# Patient Record
Sex: Female | Born: 1960 | Race: Black or African American | Hispanic: No | Marital: Married | Smoking: Former smoker
Health system: Southern US, Community
[De-identification: ages and names within clinical notes are randomized; demographics above are authoritative.]

## PROBLEM LIST (undated history)

## (undated) DIAGNOSIS — R0683 Snoring: Secondary | ICD-10-CM

## (undated) DIAGNOSIS — B0229 Other postherpetic nervous system involvement: Secondary | ICD-10-CM

## (undated) DIAGNOSIS — B029 Zoster without complications: Secondary | ICD-10-CM

## (undated) DIAGNOSIS — M9909 Segmental and somatic dysfunction of abdomen and other regions: Secondary | ICD-10-CM

## (undated) DIAGNOSIS — K219 Gastro-esophageal reflux disease without esophagitis: Secondary | ICD-10-CM

## (undated) DIAGNOSIS — R0789 Other chest pain: Secondary | ICD-10-CM

## (undated) DIAGNOSIS — I10 Essential (primary) hypertension: Secondary | ICD-10-CM

## (undated) DIAGNOSIS — E669 Obesity, unspecified: Secondary | ICD-10-CM

## (undated) DIAGNOSIS — E7800 Pure hypercholesterolemia, unspecified: Secondary | ICD-10-CM

## (undated) DIAGNOSIS — T7840XA Allergy, unspecified, initial encounter: Secondary | ICD-10-CM

## (undated) HISTORY — DX: Allergy, unspecified, initial encounter: T78.40XA

## (undated) HISTORY — PX: TUBAL LIGATION: SHX77

## (undated) HISTORY — DX: Other chest pain: R07.89

## (undated) HISTORY — DX: Segmental and somatic dysfunction of abdomen and other regions: M99.09

## (undated) HISTORY — DX: Gastro-esophageal reflux disease without esophagitis: K21.9

## (undated) HISTORY — DX: Pure hypercholesterolemia, unspecified: E78.00

## (undated) HISTORY — DX: Essential (primary) hypertension: I10

## (undated) HISTORY — DX: Snoring: R06.83

## (undated) HISTORY — DX: Obesity, unspecified: E66.9

## (undated) HISTORY — DX: Zoster without complications: B02.9

## (undated) HISTORY — PX: DILATION AND CURETTAGE OF UTERUS: SHX78

## (undated) HISTORY — DX: Other postherpetic nervous system involvement: B02.29

## (undated) HISTORY — PX: COLONOSCOPY: SHX174

---

## 1998-05-30 ENCOUNTER — Other Ambulatory Visit: Admission: RE | Admit: 1998-05-30 | Discharge: 1998-05-30 | Payer: Self-pay | Admitting: Obstetrics

## 2001-08-23 ENCOUNTER — Inpatient Hospital Stay (HOSPITAL_COMMUNITY): Admission: AD | Admit: 2001-08-23 | Discharge: 2001-08-23 | Payer: Self-pay | Admitting: Obstetrics

## 2001-08-23 ENCOUNTER — Encounter (INDEPENDENT_AMBULATORY_CARE_PROVIDER_SITE_OTHER): Payer: Self-pay

## 2001-10-05 ENCOUNTER — Observation Stay (HOSPITAL_COMMUNITY): Admission: RE | Admit: 2001-10-05 | Discharge: 2001-10-06 | Payer: Self-pay | Admitting: Obstetrics

## 2001-10-05 ENCOUNTER — Encounter (INDEPENDENT_AMBULATORY_CARE_PROVIDER_SITE_OTHER): Payer: Self-pay | Admitting: Specialist

## 2001-12-27 ENCOUNTER — Ambulatory Visit (HOSPITAL_COMMUNITY): Admission: RE | Admit: 2001-12-27 | Discharge: 2001-12-27 | Payer: Self-pay | Admitting: Obstetrics

## 2001-12-27 ENCOUNTER — Encounter: Payer: Self-pay | Admitting: Obstetrics

## 2004-11-07 ENCOUNTER — Ambulatory Visit: Payer: Self-pay | Admitting: Pulmonary Disease

## 2004-11-25 ENCOUNTER — Ambulatory Visit (HOSPITAL_COMMUNITY): Admission: RE | Admit: 2004-11-25 | Discharge: 2004-11-25 | Payer: Self-pay | Admitting: Obstetrics

## 2005-11-12 ENCOUNTER — Ambulatory Visit: Payer: Self-pay | Admitting: Pulmonary Disease

## 2006-11-18 ENCOUNTER — Ambulatory Visit: Payer: Self-pay | Admitting: Pulmonary Disease

## 2006-11-18 LAB — CONVERTED CEMR LAB
ALT: 28 units/L (ref 0–35)
Albumin: 4.1 g/dL (ref 3.5–5.2)
Alkaline Phosphatase: 59 units/L (ref 39–117)
Bilirubin Urine: NEGATIVE
Creatinine, Ser: 0.8 mg/dL (ref 0.4–1.2)
Direct LDL: 194 mg/dL
Eosinophils Absolute: 0.1 10*3/uL (ref 0.0–0.6)
Eosinophils Relative: 0.6 % (ref 0.0–5.0)
GFR calc Af Amer: 100 mL/min
Glucose, Bld: 102 mg/dL — ABNORMAL HIGH (ref 70–99)
Hemoglobin, Urine: NEGATIVE
Hemoglobin: 13.5 g/dL (ref 12.0–15.0)
Leukocytes, UA: NEGATIVE
Monocytes Absolute: 0.9 10*3/uL — ABNORMAL HIGH (ref 0.2–0.7)
RBC: 4.21 M/uL (ref 3.87–5.11)
Sodium: 140 meq/L (ref 135–145)
Total Bilirubin: 1.1 mg/dL (ref 0.3–1.2)
Total CHOL/HDL Ratio: 4.6
Total Protein, Urine: NEGATIVE mg/dL
Total Protein: 7.4 g/dL (ref 6.0–8.3)
Triglycerides: 106 mg/dL (ref 0–149)
VLDL: 21 mg/dL (ref 0–40)

## 2008-02-16 ENCOUNTER — Emergency Department (HOSPITAL_COMMUNITY): Admission: EM | Admit: 2008-02-16 | Discharge: 2008-02-17 | Payer: Self-pay | Admitting: *Deleted

## 2008-02-19 ENCOUNTER — Emergency Department (HOSPITAL_COMMUNITY): Admission: EM | Admit: 2008-02-19 | Discharge: 2008-02-19 | Payer: Self-pay | Admitting: Emergency Medicine

## 2008-02-25 ENCOUNTER — Encounter: Admission: RE | Admit: 2008-02-25 | Discharge: 2008-03-28 | Payer: Self-pay | Admitting: Family Medicine

## 2008-08-15 ENCOUNTER — Ambulatory Visit: Payer: Self-pay | Admitting: Pulmonary Disease

## 2008-08-15 ENCOUNTER — Telehealth (INDEPENDENT_AMBULATORY_CARE_PROVIDER_SITE_OTHER): Payer: Self-pay | Admitting: *Deleted

## 2008-08-15 DIAGNOSIS — B0229 Other postherpetic nervous system involvement: Secondary | ICD-10-CM | POA: Insufficient documentation

## 2008-08-15 DIAGNOSIS — E7800 Pure hypercholesterolemia, unspecified: Secondary | ICD-10-CM | POA: Insufficient documentation

## 2008-08-15 DIAGNOSIS — B029 Zoster without complications: Secondary | ICD-10-CM | POA: Insufficient documentation

## 2008-08-15 DIAGNOSIS — J309 Allergic rhinitis, unspecified: Secondary | ICD-10-CM | POA: Insufficient documentation

## 2008-08-15 DIAGNOSIS — R0989 Other specified symptoms and signs involving the circulatory and respiratory systems: Secondary | ICD-10-CM

## 2008-08-15 DIAGNOSIS — M999 Biomechanical lesion, unspecified: Secondary | ICD-10-CM | POA: Insufficient documentation

## 2008-08-15 DIAGNOSIS — I10 Essential (primary) hypertension: Secondary | ICD-10-CM | POA: Insufficient documentation

## 2008-08-15 DIAGNOSIS — R0789 Other chest pain: Secondary | ICD-10-CM | POA: Insufficient documentation

## 2008-08-15 DIAGNOSIS — E669 Obesity, unspecified: Secondary | ICD-10-CM | POA: Insufficient documentation

## 2008-08-15 DIAGNOSIS — K219 Gastro-esophageal reflux disease without esophagitis: Secondary | ICD-10-CM | POA: Insufficient documentation

## 2008-08-15 DIAGNOSIS — R0609 Other forms of dyspnea: Secondary | ICD-10-CM | POA: Insufficient documentation

## 2008-08-20 LAB — CONVERTED CEMR LAB
ALT: 20 units/L (ref 0–35)
AST: 21 units/L (ref 0–37)
Basophils Absolute: 0 10*3/uL (ref 0.0–0.1)
Basophils Relative: 0 % (ref 0.0–3.0)
Bilirubin, Direct: 0.1 mg/dL (ref 0.0–0.3)
Chloride: 105 meq/L (ref 96–112)
Creatinine, Ser: 0.7 mg/dL (ref 0.4–1.2)
Eosinophils Relative: 1.1 % (ref 0.0–5.0)
HCT: 36.6 % (ref 36.0–46.0)
Hemoglobin: 12.6 g/dL (ref 12.0–15.0)
Monocytes Absolute: 0.7 10*3/uL (ref 0.1–1.0)
Neutro Abs: 4.1 10*3/uL (ref 1.4–7.7)
Neutrophils Relative %: 51.8 % (ref 43.0–77.0)
TSH: 1.63 microintl units/mL (ref 0.35–5.50)
Total Bilirubin: 0.8 mg/dL (ref 0.3–1.2)
WBC: 7.9 10*3/uL (ref 4.5–10.5)

## 2008-09-19 ENCOUNTER — Ambulatory Visit: Payer: Self-pay | Admitting: Pulmonary Disease

## 2008-10-06 LAB — CONVERTED CEMR LAB
Total CHOL/HDL Ratio: 3
VLDL: 21.2 mg/dL (ref 0.0–40.0)

## 2009-04-02 ENCOUNTER — Ambulatory Visit (HOSPITAL_COMMUNITY): Admission: RE | Admit: 2009-04-02 | Discharge: 2009-04-02 | Payer: Self-pay | Admitting: Obstetrics

## 2009-04-20 ENCOUNTER — Telehealth (INDEPENDENT_AMBULATORY_CARE_PROVIDER_SITE_OTHER): Payer: Self-pay | Admitting: *Deleted

## 2009-11-28 ENCOUNTER — Ambulatory Visit: Payer: Self-pay | Admitting: Pulmonary Disease

## 2009-11-29 LAB — CONVERTED CEMR LAB
BUN: 10 mg/dL (ref 6–23)
Basophils Absolute: 0 10*3/uL (ref 0.0–0.1)
Basophils Relative: 0.4 % (ref 0.0–3.0)
Bilirubin Urine: NEGATIVE
Cholesterol: 229 mg/dL — ABNORMAL HIGH (ref 0–200)
Creatinine, Ser: 0.7 mg/dL (ref 0.4–1.2)
Direct LDL: 144.6 mg/dL
Eosinophils Relative: 0.4 % (ref 0.0–5.0)
GFR calc non Af Amer: 120.92 mL/min (ref >60)
HDL: 61.4 mg/dL (ref >39.00)
Hemoglobin, Urine: NEGATIVE
Hemoglobin: 12.8 g/dL (ref 12.0–15.0)
Ketones, ur: NEGATIVE mg/dL
Lymphs Abs: 2.5 10*3/uL (ref 0.7–4.0)
Monocytes Absolute: 0.6 10*3/uL (ref 0.1–1.0)
Neutro Abs: 4.4 10*3/uL (ref 1.4–7.7)
Neutrophils Relative %: 58.1 % (ref 43.0–77.0)
Platelets: 244 10*3/uL (ref 150.0–400.0)
RDW: 13.4 % (ref 11.5–14.6)
Total Protein, Urine: NEGATIVE mg/dL
Total Protein: 7.1 g/dL (ref 6.0–8.3)
Triglycerides: 87 mg/dL (ref 0.0–149.0)
WBC: 7.5 10*3/uL (ref 4.5–10.5)
pH: 6 (ref 5.0–8.0)

## 2010-04-03 ENCOUNTER — Ambulatory Visit (HOSPITAL_COMMUNITY)
Admission: RE | Admit: 2010-04-03 | Discharge: 2010-04-03 | Payer: Self-pay | Source: Home / Self Care | Attending: Obstetrics | Admitting: Obstetrics

## 2010-04-05 ENCOUNTER — Ambulatory Visit (HOSPITAL_COMMUNITY)
Admission: RE | Admit: 2010-04-05 | Discharge: 2010-04-05 | Payer: Self-pay | Source: Home / Self Care | Attending: Obstetrics | Admitting: Obstetrics

## 2010-04-28 ENCOUNTER — Encounter: Payer: Self-pay | Admitting: Obstetrics

## 2010-05-07 NOTE — Progress Notes (Signed)
Summary: prescriptions  Phone Note Call from Patient Call back at [phone]   Caller: Patient Call For: nadel Summary of Call: Needs something called in for her allergies and sinus. Also needs something for sleep and shingles.//[facility] Initial call taken by: Darletta Moll,  April 20, 2009 10:42 AM  Follow-up for Phone Call        called, spoke with pt.  1.  Pt states she "believes" she is getting a "reoccurance of shingles."  states right arm is hurting, and she has bumps on back, top of right shoulder, and under right breast x2wks.  requesting pain med, med for shingles, and SN's recs.  2.  c/o eyes watering all the time, eyes are sensitive to light, eyes are "closing up,"  and having head congestion and drainage with thick yellowish green mucus x2 mo.  denies fever.  3.  requesting rx for sleep med.  Will forward message to SN-please advise.  Thanks! NKDA [facility] [address] Follow-up by: Gweneth Dimitri RN,  April 20, 2009 11:47 AM  Additional Follow-up for Phone Call Additional follow up Details #1::        per SN---is shingles pain persists then she will need neurology eval with neurologist---for the cough and drainage--ok for pt to have zpak #1  and use mucinex max 1 by mouth two times a day regularly with plenty of fluids and for the insomnia she can use ambien 10mg   #30  1 by mouth at bedtime .  thanks    Additional Follow-up for Phone Call Additional follow up Details #2::    called, spoke with pt.  Pt informed of above statement per SN and aware rxs [facility] to [facility] [address]  She verbalized understanding of instructions  Follow-up by: Gweneth Dimitri RN,  April 20, 2009 2:59 PM  New/Updated Medications: ZITHROMAX Z-PAK 250 MG TABS (AZITHROMYCIN) use as directed AMBIEN 10 MG TABS (ZOLPIDEM TARTRATE) 1 by mouth at bedtime Prescriptions: AMBIEN 10 MG TABS (ZOLPIDEM TARTRATE) 1 by mouth at bedtime  #30 x 0   Entered by:   Gweneth Dimitri RN   Authorized  by:   Michele Mcalpine MD   Signed by:   Gweneth Dimitri RN on 04/20/2009   Method used:   Telephoned to ...       [facility] [address] [id number]* (retail)       [address] [place], [place]  [address]       Ph: [phone]       Fax: [phone]   RxID:   [id number] ZITHROMAX Z-PAK 250 MG TABS (AZITHROMYCIN) use as directed  #1 x 0   Entered by:   Gweneth Dimitri RN   Authorized by:   Michele Mcalpine MD   Signed by:   Gweneth Dimitri RN on 04/20/2009   Method used:   Electronically to        [facility] [address] [id number]* (retail)       [address] [place], [place]  [address]       Ph: [phone]       Fax: [phone]   RxID:   [id number]

## 2010-05-07 NOTE — Assessment & Plan Note (Signed)
Summary: physical ///kp   CC:  Yearly ROV & CPX....  History of Present Illness: 50 y/o BF here for an annual CPX...   ~  May10:  add-on visit due to shingles pain...  she states that she developed a painful rash on her right side about 1 month ago and went to an [facility] where she was diagnosed w/ right T5-6 shingles... she was given Valtrex and Percocet... the rash improved but the pain increased and has persisted... she ret to the [facility] and was given more Percocet... she now c/o persistant burning pain in this right T5-6 distrib, and she rates this a 6-7/10 in severity... given Depo80/ Dosepak, Neurontin, Vicodin...   ~  September 19, 2008:  Shingles eruption resolved w/ min scarring... pain improved too- taking Neurontin 300Bid and Vicodin Prn only (prev needed 3-4 per day)... overall felling well, difficult time w/ diet + exercise...  weight 238#- no change & we discussed diet + exercise program.. also hx incr chol & she agrees to try statin Rx if FLP looks similar today...   ~  November 28, 2009:  she's had a good yr but c/o incr allergy symptoms w/ sneezing, nasal congestion, and itching all over> we discussed Rx w/ Omnaris, Saline, & Atarax- next step= allergy testing... BP stable w/ low sodium diet but she needs to lose wt or face meds in the future & she understands this... similarly she refuses Statin Rx for Chol but needs to lose substantial weight... requests refills Vicodin, Ambien today.    Current Problems:   ALLERGIC RHINITIS (ICD-477.9) - she uses OTC antihistamines Prn, but c/o increased symptoms and we discussed adding OMNARIS Qhs, Saline Prn, and ATARAX 25mg  Prn itching...  Hx of SNORING (ICD-786.09) - Hx snoring and ? of OSA- she was rec to take a sleep study but she declined... we discussed sleep hygiene & the importance of weight reduction...  HYPERTENSION, MILD (ICD-401.1) - BP's up to 150/90 in the past and improved at present 134/88 today... not on med Rx and encouraged to elim  sodium & lose weight... denies HA, visual changes, palipit, dizziness, syncope, dyspnea, edema, etc...  CHEST PAIN, ATYPICAL (ICD-786.59) - hx mult somatic complaints and CWP in the past... baseline EKG w/ NSR (rare PVC), WNL.[name]. baseline CXR w/ clear lungs, NAD...  HYPERCHOLESTEROLEMIA (ICD-272.0) - we have perscribed Simvastatin 40mg /d twice in the past (8/07 & 8/08) & Cres10 (6/10) but she never followed through & stopped the meds...we have discussed low chol/ low fat diet & weight reduction strategies...   ~  FLP 8/07 on diet showed TChol 245, TG 113, HDL 47, LDL 169... rec to start Simva40- never did.  ~  FLP 8/08 on diet showed TChol 279, TG 106, HDL 60, LDL 194... rec to start Simva40- pt stopped.  ~  FLP 6/10 on diet showed TChol 210, TG 106, HDL 64, LDL 130... rec> Cres10- pt stopped on her own.  ~  FLP 8/11 on diet alone showed TChol 229, TG 87, HDL 61, LDL 145... refuses meds, must lose weight!!!  OBESITY (ICD-278.00) - we have discussed diet/ exercise/ wt reduction strategies...  ~  initial weight 7/89 = 229#... max weight 8/06 = 274#.[name].  ~  weight 8/07 = 262#  ~  weight 8/08 = 260#  ~  weight 5/10 = 239#  ~  weight 8/11 = 230#  GERD (ICD-530.81) - hx reflux symptoms but never had GI eval or EGD... takes OTC Prilosec vs Ranitadine as needed.  Hx  of SOMATIC DYSFUNCTION (ICD-739.9) - hx mult somatic complaints in the past...  ~  6/10: c/o headaches, insomnia, mult somatic complaints- Rx w/ Vicodin Prn use + AMBIEN 10mg  Prn for sleep.  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  Health Maintenance - GYN= DrMarshall w/ last PAP 12/10... usually gets yearly Mammogram at [facility]...   Preventive Screening-Counseling & Management  Alcohol-Tobacco     Smoking Status: quit     Year Quit: 2008  Allergies (verified): No Known Drug Allergies  Comments:  Nurse/Medical  Assistant: The patient's medications and allergies were reviewed with the patient and were updated in the Medication and Allergy Lists.  Past History:  Past Medical History: ALLERGIC RHINITIS (ICD-477.9) Hx of SNORING (ICD-786.09) HYPERTENSION, MILD (ICD-401.1) CHEST PAIN, ATYPICAL (ICD-786.59) HYPERCHOLESTEROLEMIA (ICD-272.0) OBESITY (ICD-278.00) GERD (ICD-530.81) Hx of SOMATIC DYSFUNCTION (ICD-739.9) SHINGLES (ICD-053.9) POSTHERPETIC NEURALGIA (ICD-053.19)  Social History: Smoking Status:  quit  Vital Signs:  Patient profile:   50 year old female Height:      66 inches Weight:      230 pounds BMI:     37.26 O2 Sat:      100 % on Room air Temp:     97.7 degrees F oral Pulse rate:   78 / minute BP sitting:   134 / 88  (left arm) Cuff size:   regular  Vitals Entered By: Randell Loop CMA (November 28, 2009 10:33 AM)  O2 Sat at Rest %:  100 O2 Flow:  Room air CC: Yearly ROV & CPX... Is Patient Diabetic? No Pain Assessment Patient in pain? yes      Comments no daily meds per pt---she is out of all meds on her list    CXR  Procedure date:  11/28/2009  Findings:      CHEST - 2 VIEW Comparison: 09/19/2008   Findings: Midline trachea.  Normal heart size and mediastinal contours. No pleural effusion or pneumothorax.  Increased density projecting over the lung bases is secondary to overlying soft tissues. Clear lungs.   IMPRESSION: No acute cardiopulmonary disease.   Read By:  Consuello Bossier,  M.D.   EKG  Procedure date:  11/28/2009  Findings:      Normal sinus rhythm with rate of:  80/ min... Tracing shows rare PVC, otherw WNL, NAD...   MISC. Report  Procedure date:  11/28/2009  Findings:      Lipid Panel (LIPID)   Cholesterol          [H]  229 mg/dL                   1-610   Triglycerides             87.0 mg/dL                  9.6-045.4   HDL                       09.81 mg/dL                 >19.14 Cholesterol LDL - Direct                              144.6 mg/dL                    BMP (METABOL)  Sodium                    140 mEq/L                   135-145   Potassium                 4.0 mEq/L                   3.5-5.1   Chloride                  105 mEq/L                   96-112   Carbon Dioxide            28 mEq/L                    19-32   Glucose                   89 mg/dL                    14-78   BUN                       10 mg/dL                    2-95   Creatinine                0.7 mg/dL                   6.2-1.3   Calcium                   9.4 mg/dL                   0.8-65.7   GFR                       120.92 mL/min               >60  Hepatic/Liver Function Panel (HEPATIC)   Total Bilirubin           0.9 mg/dL                   8.4-6.9   Direct Bilirubin          0.1 mg/dL                   6.2-9.5   Alkaline Phosphatase      43 U/L                      39-117   AST                       20 U/L                      0-37   ALT                       19 U/L                      0-35   Total Protein             7.1 g/dL  6.0-8.3   Albumin                   4.3 g/dL                    1.6-1.0  Comments:      CBC Platelet w/Diff (CBCD)   White Cell Count          7.5 K/uL                    4.5-10.5   Red Cell Count            3.99 Mil/uL                 3.87-5.11   Hemoglobin                12.8 g/dL                   96.0-45.4   Hematocrit                37.5 %                      36.0-46.0   MCV                       94.0 fl                     78.0-100.0   Platelet Count            244.0 K/uL                  150.0-400.0   Neutrophil %              58.1 %                      43.0-77.0   Lymphocyte %              32.7 %                      12.0-46.0   Monocyte %                8.4 %                       3.0-12.0   Eosinophils%              0.4 %                       0.0-5.0   Basophils %               0.4 %                       0.0-3.0   TSH (TSH)   FastTSH                   0.79  uIU/mL                 0.35-5.50  UDip Only (UDIP)   Color                     LT. YELLOW   Clarity                   SL CLOUDY  Clear   Specific Gravity          >=1.030                     1.000 - 1.030   Urine Ph                  6.0                         5.0-8.0   Protein                   NEGATIVE                    Negative   Urine Glucose             NEGATIVE                    Negative   Ketones                   NEGATIVE                    Negative   Urine Bilirubin           NEGATIVE                    Negative   Blood                     NEGATIVE                    Negative   Urobilinogen              0.2                         0.0 - 1.0   Leukocyte Esterace        NEGATIVE                    Negative   Nitrite                   NEGATIVE                    Negative  Impression & Recommendations:  Problem # 1:  PHYSICAL EXAMINATION (ICD-V70.0)  Orders: EKG w/ Interpretation (93000) TLB-Lipid Panel (80061-LIPID) TLB-BMP (Basic Metabolic Panel-BMET) (80048-METABOL) TLB-Hepatic/Liver Function Pnl (80076-HEPATIC) TLB-CBC Platelet - w/Differential (85025-CBCD) TLB-TSH (Thyroid Stimulating Hormone) (84443-TSH) TLB-Udip ONLY (81003-UDIP) T-2 View CXR (71020TC)  Problem # 2:  ALLERGIC RHINITIS (ICD-477.9) We discussed ALLEGRA, OMNARIS, ATARAX... allergy testing if symptoms don't abate. Her updated medication list for this problem includes:    Omnaris 50 Mcg/act Susp (Ciclesonide) .[name]... 2 sprays each nostril at bedtime...  Problem # 3:  HYPERTENSION, MILD (ICD-401.1) Controlled on diet alone, but desperately needs to lose weight...  Problem # 4:  HYPERCHOLESTEROLEMIA (ICD-272.0) She refuses medication & wants to control on diet alone> discussed the weight loss necessary for controlling her Chol etc...  Problem # 5:  OBESITY (ICD-278.00) Discussed diet + exercise needed...  Problem # 6:  OTHER MEDICAL ISSUES AS NOTED>>>  Complete Medication List: 1)   Omnaris 50 Mcg/act Susp (Ciclesonide) .... 2 sprays each nostril at bedtime.Marland KitchenMarland Kitchen 2)  Hydrocodone-acetaminophen 5-500 Mg Tabs (Hydrocodone-acetaminophen) .... Take 1-2 tabs by mouth three times a day as needed for severe pain.Marland KitchenMarland Kitchen  3)  Ambien 10 Mg Tabs (Zolpidem tartrate) .[name].. 1 by mouth at bedtime 4)  Hydroxyzine Hcl 25 Mg Tabs (Hydroxyzine hcl) .... Take 1 tab by mouth every 4 h as needed for itching...  Patient Instructions: 1)  Today we updated your med list- see below.... 2)  We refilled your meds & wrote new perscriptions for OMNARIS & HYDROXYZINE for you allergies.Marland KitchenMarland Kitchen 3)  Today we did your follow up CXR, EKG, & Fasting blood work... please call the "phone tree" in a few days for your lab results.Marland KitchenMarland Kitchen 4)  Call for any questions.Marland KitchenMarland Kitchen 5)  Please schedule a follow-up appointment in 1 year.     [facility] ECG  ID: [id number] Patient: Jessica Lynn, Jessica Lynn DOB: November 11, 1960 Age: 50 Years Old Sex: Female Race: Black Physician: scott nadel Technician: Randell Loop CMA Height: 66 Weight: 230 Status: Unconfirmed Past Medical History:  ALLERGIC RHINITIS (ICD-477.9) Hx of SNORING (ICD-786.09) HYPERTENSION, MILD (ICD-401.1) CHEST PAIN, ATYPICAL (ICD-786.59) HYPERCHOLESTEROLEMIA (ICD-272.0) OBESITY (ICD-278.00) GERD (ICD-530.81) Hx of SOMATIC DYSFUNCTION (ICD-739.9) SHINGLES (ICD-053.9) POSTHERPETIC NEURALGIA (ICD-053.19)   Recorded: 11/28/2009 10:48 AM P/PR: 90 ms / 132 ms - Heart rate (maximum exercise) QRS: 93 QT/QTc/QTd: 376 ms / 409 ms / 20 ms - Heart rate (maximum exercise)  P/QRS/T axis: 52 deg / 39 deg / 2 deg - Heart rate (maximum exercise)  Heartrate: 79 bpm  Interpretation:  Normal sinus rhythm with rate of:  80/ min... Tracing shows rare PVC, otherw WNL, NAD.Marland KitchenMarland Kitchen

## 2010-05-27 ENCOUNTER — Encounter: Payer: Self-pay | Admitting: Pulmonary Disease

## 2010-05-29 ENCOUNTER — Encounter: Payer: Self-pay | Admitting: Pulmonary Disease

## 2010-05-29 ENCOUNTER — Telehealth (INDEPENDENT_AMBULATORY_CARE_PROVIDER_SITE_OTHER): Payer: Self-pay | Admitting: *Deleted

## 2010-05-31 ENCOUNTER — Encounter: Payer: Self-pay | Admitting: Pulmonary Disease

## 2010-06-04 NOTE — Medication Information (Signed)
Summary: Tax adviser   Imported By: Valinda Hoar 05/31/2010 13:18:56  _____________________________________________________________________  External Attachment:    Type:   Image     Comment:   External Document

## 2010-06-04 NOTE — Progress Notes (Signed)
Summary: Zolpidem PA initiated---APPROVED from 05/09/2010 to 05/30/2011  Phone Note Outgoing Call   Call placed by: Michel Bickers CMA,  May 29, 2010 9:42 AM Call placed to: [facility] [phone] Summary of Call: PA initiated for Zolpidem for quantity exception. Plan allows 15 tabs within a 30 day period only. Member ID 3 is [id number].   Case # is [id number].   Awaiting faxed form. Initial call taken by: Michel Bickers CMA,  May 29, 2010 9:44 AM  Follow-up for Phone Call        Form received and placed on SN cart for signature.Michel Bickers CMA  May 29, 2010 10:14 AM  Zolpidem has been APPROVED for additional quantity from 05/09/2010 to 05/30/2011. Patient and pharmacy notified. Follow-up by: Michel Bickers CMA,  May 31, 2010 8:59 AM  Additional Follow-up for Phone Call Additional follow up Details #1::

## 2010-06-13 NOTE — Medication Information (Signed)
Summary: Prior Authorization for Zolpidem / [employer]  Prior Authorization for Zolpidem / [employer]   Imported By: Lennie Odor 06/03/2010 14:24:23  _____________________________________________________________________  External Attachment:    Type:   Image     Comment:   External Document

## 2010-06-13 NOTE — Medication Information (Signed)
Summary: Prior Authorization Needed for Zolpidem / [facility]  Prior Authorization Needed for Zolpidem / [facility]   Imported By: Lennie Odor 06/03/2010 14:28:14  _____________________________________________________________________  External Attachment:    Type:   Image     Comment:   External Document

## 2010-07-08 ENCOUNTER — Telehealth: Payer: Self-pay | Admitting: Pulmonary Disease

## 2010-07-08 NOTE — Telephone Encounter (Signed)
Pt requesting Hydrocodone/APAP 5-500mg  Take 1-2 tabs three times daily to be called to [facility]. Pt last seen 11/28/2009. Please advise. Thanks

## 2010-07-10 MED ORDER — HYDROCODONE-ACETAMINOPHEN 5-500 MG PO TABS: 1.0000 | ORAL_TABLET | Freq: Three times a day (TID) | ORAL | Status: DC | PRN

## 2010-07-10 NOTE — Telephone Encounter (Signed)
Called into the pharamcy with vicodin 5/500 refills x 5.  Pt is aware and pt has appt with SN in august for cpx.

## 2010-08-23 NOTE — Discharge Summary (Signed)
[facility]  Patient:    Jessica Lynn, Jessica Lynn Visit Number: [id number] MRN: [id number]          Service Type: DSU Location: 9300 [id number] Attending Physician:  Venita Sheffield Dictated by:   Kathreen Cosier, M.D. Admit Date:  10/05/2001 Discharge Date: 10/06/2001                             Discharge Summary  HISTORY AND HOSPITAL COURSE:      The patient is a 50 year old female who came in for open laparoscopic tubal sterilization on October 05, 2001.  The procedure was not completed as a laparoscopic and she had a mini lap performed with an abdominal tubal ligation performed.  It was decided she would be kept overnight.  Prior to discharge vital signs were all normal, abdomen soft, good bowel sounds, dressing was dry, and the patient felt fine.  She was discharged home on Tylox two every 3-4 hours to see me in 2 weeks.  DISCHARGE DIAGNOSIS:              Status post abdominal tubal ligation through                                   mini laparoscopy. Dictated by:   Kathreen Cosier, M.D. Attending Physician:  Venita Sheffield DD:  10/06/01 TD:  10/08/01 Job: [id number]

## 2010-08-23 NOTE — Op Note (Signed)
[facility]  Patient:    Jessica Lynn, Jessica Lynn Visit Number: [id number] MRN: [id number]          Service Type: DSU Location: 9300 [id number] Attending Physician:  Venita Sheffield Dictated by:   Kathreen Cosier, M.D. Proc. Date: 10/05/01 Admit Date:  10/05/2001 Discharge Date: 10/06/2001                             Operative Report  PREOPERATIVE DIAGNOSES:       Multiparity.  PROCEDURE:                    Failed laparoscopic tubal and procedure converted to abdominal tubal through a mini laparotomy.  Under general anesthesia with patient in lithotomy position, abdomen, perineum, and vagina prepped and draped.  A weighted speculum placed in the vagina after the bladder was emptied with a straight catheter.  Hulka tenaculum placed in the cervix.  Transverse incision made through the umbilicus, carried down to the fascia.  Panniculus was approximately 4-5 inches deep.  The fascia was cleaned and grasped with two Kochers, then the sleeve of the trocar inserted intraperitoneally.  Attempt to maintain pneumoperitoneum was unsatisfactory because of leakage of the gas plus when the scope inserted, because of the amount of fatty tissue intra-abdominally, the uterus could be visualized but the tubes and ovaries could not be visualized.  It was decided the procedure would be converted to an abdominal tubal for a mini laparotomy.  The instruments were removed.  Fascia closed with one stitch of 0 Dexon.  Skin closed with subcuticular suture of plain.  Abdomen reprepped and draped and the bladder emptied with the Foley catheter.  Transverse suprapubic incision made, carried down to the fascia.  Fascia cleaned and incised the length of the incision.  Recti muscles retracted laterally.  Peritoneum opened longitudinally.  The uterus and tubes were visualized and ovaries appeared normal.  On the left side the tube was grasped with a Babcock clamp and 0 plain  suture placed in the mesosalpinx below the portion of tube in the clamp. This was tied and approximately 1 inch of tube transected.  The procedure was done in the exact fashion on the other side.  Ties were doubly tied with 0 plain suture.  Hemostasis satisfactory.  Fascia closed with continuous suture of 0 Dexon.  Subcutaneous tissue closed with 3-0 plain.  Skin closed with subcuticular stitch of 3-0 Monocryl.  The patient tolerated procedure well. Taken to recovery room in good condition. Dictated by:   Kathreen Cosier, M.D. Attending Physician:  Venita Sheffield DD:  10/05/01 TD:  10/07/01 Job: [id number]

## 2010-12-02 ENCOUNTER — Ambulatory Visit (INDEPENDENT_AMBULATORY_CARE_PROVIDER_SITE_OTHER): Payer: BC Managed Care – PPO | Admitting: Pulmonary Disease

## 2010-12-02 ENCOUNTER — Encounter: Payer: Self-pay | Admitting: Pulmonary Disease

## 2010-12-02 VITALS — BP 120/82 | HR 82 | Temp 97.1°F | Ht 66.0 in | Wt 241.6 lb

## 2010-12-02 DIAGNOSIS — B0229 Other postherpetic nervous system involvement: Secondary | ICD-10-CM

## 2010-12-02 DIAGNOSIS — J309 Allergic rhinitis, unspecified: Secondary | ICD-10-CM

## 2010-12-02 DIAGNOSIS — E7800 Pure hypercholesterolemia, unspecified: Secondary | ICD-10-CM

## 2010-12-02 DIAGNOSIS — R0789 Other chest pain: Secondary | ICD-10-CM

## 2010-12-02 DIAGNOSIS — Z0000 Encounter for general adult medical examination without abnormal findings: Secondary | ICD-10-CM

## 2010-12-02 DIAGNOSIS — K219 Gastro-esophageal reflux disease without esophagitis: Secondary | ICD-10-CM

## 2010-12-02 DIAGNOSIS — E669 Obesity, unspecified: Secondary | ICD-10-CM

## 2010-12-02 DIAGNOSIS — M999 Biomechanical lesion, unspecified: Secondary | ICD-10-CM

## 2010-12-02 DIAGNOSIS — I10 Essential (primary) hypertension: Secondary | ICD-10-CM

## 2010-12-02 MED ORDER — HYDROCODONE-ACETAMINOPHEN 5-500 MG PO TABS: 1.0000 | ORAL_TABLET | Freq: Three times a day (TID) | ORAL | Status: DC | PRN

## 2010-12-02 MED ORDER — ZOLPIDEM TARTRATE 10 MG PO TABS: 10.0000 mg | ORAL_TABLET | Freq: Every evening | ORAL | Status: DC | PRN

## 2010-12-02 MED ORDER — CICLESONIDE 50 MCG/ACT NA SUSP: 2.0000 | Freq: Every day | NASAL | Status: DC

## 2010-12-02 NOTE — Patient Instructions (Addendum)
Today we updated your meds in EPIC...    We refilled your medications per request...  Please return to our lab one morning this week for your follow up fasting blood work...    Then please call the PHONE TREE in a few days for your results...    Dial [id number] & when prompted enter your patient number followed by the # symbol...    Your patient number is:  [id number]#  Let's get on track w/ our 1000 cal diet & exercise program...  Call for any questions...   ADDENDUM>> after labs returned pt rec to start Cres10 daily & VitD 50K weekly.Marland KitchenMarland Kitchen

## 2010-12-02 NOTE — Progress Notes (Signed)
Subjective:    Patient ID: Jessica Lynn, female    DOB: [birth year], 50 y.o.   MRN: [id number]  HPI 50 y/o BF here for an annual CPX...  ~  May10:  add-on visit due to shingles pain...  she states that she developed a painful rash on her right side about 1 month ago and went to an [facility] where she was diagnosed w/ right T5-6 shingles... she was given Valtrex and Percocet... the rash improved but the pain increased and has persisted... she ret to the [facility] and was given more Percocet... she now c/o persistant burning pain in this right T5-6 distrib, and she rates this a 6-7/10 in severity... given Depo80/ Dosepak, Neurontin, Vicodin...  ~  September 19, 2008:  Shingles eruption resolved w/ min scarring... pain improved too- taking Neurontin 300Bid and Vicodin Prn only (prev needed 3-4 per day)... overall felling well, difficult time w/ diet + exercise...  weight 238#- no change & we discussed diet + exercise program.. also hx incr chol & she agrees to try statin Rx if FLP looks similar today...  ~  November 28, 2009:  10mo ROV> she's had a good yr but c/o incr allergy symptoms w/ sneezing, nasal congestion, and itching all over> we discussed Rx w/ Omnaris, Saline, & Atarax- next step= allergy testing... BP stable w/ low sodium diet but she needs to lose wt or face meds in the future & she understands this... similarly she refuses Statin Rx for Chol but needs to lose substantial weight... requests refills Vicodin, Ambien today.  ~  December 02, 2010:  Yearly ROV & Ziasia has remained stable> the only entry into the EMR during the past yr was refill request for her Ambien;  BP controlled on diet alone but weight is up 12# to 242# today;  She notes occas intermittent CP, dizziness, DOE, etc; but she denies palpit, SOB, edema;  She has refused meds for her Lipids & was asked to improve her low chol/ low fat diet & get weight down...    Her CC is intermittent pain in right leg which she thinks is coming from the  Shingles she had in 2010 (T5-6 on right); I offered med rx w/ Neurontin vs Neuro eval from DrWillis et al & she will think about it & let me know, otherw continue current pain mangement...    She did see DrMarshall, GYN for check up & Pelvic sonar showed enlarged uterus w/ an ant uterine fibroid; she is holding off on surg...          PROBLEM LIST:  ALLERGIC RHINITIS (ICD-477.9) - she uses OTC antihistamines Prn, but c/o increased symptoms and we discussed adding OMNARIS Qhs, Saline Prn, and ATARAX 25mg  Prn itching...  Hx of SNORING (ICD-786.09) - Hx snoring and ? of OSA- she was rec to take a sleep study but she declined... we discussed sleep hygiene & the importance of weight reduction...  HYPERTENSION, MILD (ICD-401.1) - BP's up to 150/90 in the past and improved at present 120/82 today... not on med Rx and encouraged to elim sodium & lose weight... denies HA, visual changes, palipit, dizziness, syncope, dyspnea, edema, etc...  CHEST PAIN, ATYPICAL (ICD-786.59) - hx mult somatic complaints and CWP in the past... baseline EKG w/ NSR (rare PVC), WNL.[name]. baseline CXR w/ clear lungs, NAD...  HYPERCHOLESTEROLEMIA (ICD-272.0) - we have perscribed Simvastatin 40mg /d twice in the past (8/07 & 8/08) & Crestor10 (6/10) but she never followed through & stopped the meds... we have  discussed low chol/ low fat diet & weight reduction strategies...  ~  FLP 8/07 on diet showed TChol 245, TG 113, HDL 47, LDL 169... rec to start Simva40- never did. ~  FLP 8/08 on diet showed TChol 279, TG 106, HDL 60, LDL 194... rec to start Simva40- pt stopped. ~  FLP 6/10 on diet showed TChol 210, TG 106, HDL 64, LDL 130... rec> Cres10- pt stopped on her own. ~  FLP 8/11 on diet alone showed TChol 229, TG 87, HDL 61, LDL 145... refuses meds, must lose weight!!! ~  FLP 8/12 on diet alone showed TChol 239, TG 82, HDL 66, LDL 169... FLP worse, rec CRESTOR10mg /d.  OBESITY (ICD-278.00) - we have discussed diet/ exercise/ wt  reduction strategies... ~  initial weight 7/89 = 229#... max weight 8/06 = 274#.[name]. ~  weight 8/07 = 262# ~  weight 8/08 = 260# ~  weight 5/10 = 239# ~  weight 8/11 = 230# ~  Weight 8/12 = 242#  GERD (ICD-530.81) - hx reflux symptoms but never had GI eval or EGD... takes OTC Prilosec vs Ranitadine as needed.  Hx of SOMATIC DYSFUNCTION (ICD-739.9) - hx mult somatic complaints in the past... ~  6/10: c/o headaches, insomnia, mult somatic complaints- Rx w/ Vicodin Prn use + AMBIEN 10mg  Prn for sleep.  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  VITAMIN D DEFICIENCY >> Vit D level 8/12 = 14 and she is rec to start VIT D 50000 u weekly supplement...  Health Maintenance >>  ~  GI:  She is 49y/o now & ?DrMarshall checks for occult blood... ~  GYN:  Everardo Beals is her gynecologist & eval 12/11 included a pelvic ultrasound w/ uterine enlargement due to anterior fibroid;  She usually gets yearly Mamm[facility] at [facility]... ~  Immuniz:     No past surgical history on file.   Outpatient Encounter Prescriptions as of 12/02/2010  Medication Sig Dispense Refill  . ciclesonide (OMNARIS) 50 MCG/ACT nasal spray Place 2 sprays into both nostrils daily.        . hydrOXYzine (ATARAX) 10 MG tablet Take 10 mg by mouth 3 (three) times daily as needed.        . zolpidem (AMBIEN) 10 MG tablet Take 10 mg by mouth at bedtime as needed.        [name] HYDROcodone-acetaminophen (VICODIN) 5-500 MG per tablet Take 1 tablet by mouth every 8 (eight) hours as needed for pain.  100 tablet  5    No Known Allergies   Current Medications, Allergies, Past Medical History, Past Surgical History, Family History, and Social History were reviewed in [employer] record.    Review of Systems        The patient complains of fatigue, dyspnea on exertion, frequent headaches, and hay fever.  The  patient denies fever, chills, sweats, anorexia, weakness, malaise, weight loss, sleep disorder, blurring, diplopia, eye irritation, eye discharge, vision loss, eye pain, photophobia, earache, ear discharge, tinnitus, decreased hearing, nasal congestion, nosebleeds, sore throat, hoarseness, chest pain, palpitations, syncope, orthopnea, PND, peripheral edema, cough, dyspnea at rest, excessive sputum, hemoptysis, wheezing, pleurisy, nausea, vomiting, diarrhea, constipation, change in bowel habits, abdominal pain, melena, hematochezia, jaundice, gas/bloating, indigestion/heartburn, dysphagia, odynophagia, dysuria, hematuria, urinary frequency, urinary hesitancy, nocturia, incontinence, back pain, joint pain, joint swelling, muscle cramps, muscle weakness, stiffness, arthritis, sciatica, restless legs, leg pain at night, leg pain with exertion, rash,  itching, dryness, suspicious lesions, paralysis, paresthesias, seizures, tremors, vertigo, transient blindness, frequent falls, difficulty walking, depression, anxiety, memory loss, confusion, cold intolerance, heat intolerance, polydipsia, polyphagia, polyuria, unusual weight change, abnormal bruising, bleeding, enlarged lymph nodes, urticaria, allergic rash, and recurrent infections.     Objective:   Physical Exam     WD, Obese, 49 y/o BF in NAD... GENERAL:  Alert & oriented; pleasant & cooperative... HEENT:  [place]/AT, EOM-wnl, PERRLA, EACs-clear, TMs-wnl, NOSE-clear, THROAT-clear & wnl. NECK:  Supple w/ fairROM; no JVD; normal carotid impulses w/o bruits; no thyromegaly or nodules palpated; no lymphadenopathy. CHEST:  Clear to P & A; without wheezes/ rales/ or rhonchi. HEART:  Regular Rhythm; without murmurs/ rubs/ or gallops. ABDOMEN:  Obese, soft & nontender; normal bowel sounds; no organomegaly or masses detected. EXT: without deformities, mild arthritic changes; no varicose veins/ +venous insuffic/ no edema. NEURO:  CN's intact; motor testing normal;  sensory testing normal; gait normal & balance OK. DERM:  mild scarring in the right T5-6 distrib, no active lesions...   Assessment & Plan:   AR>  We refilled her Omnaris, and reviewed rec for OTC antihist in AM, and Saline nasal mist during the day...  HBP>  BP remains reasonable on "diet alone" but needs to get on track w/ diet, exercise & lose some weight!!!  Hx CP & mult somatic complaints>  She has prev been rec to take Statin but she has refused, preferring diet alone; unfortunately she has not lost any weight...  CHOL>  As noted she has refused meds on several occas & we discussed this; FLP is the worst yet- we reviewed diet, exercise, wt loss progeram AND rec CRESTOR 10mg /d (she will decide)...  OBESITY>  Weight reduction is key & we reviewed calorie restricted diet + exercise program...  GERD>  Hx mild GERD symptoms controlled w/ OTC PPI vs H2Blockers...  Hx Shingles & Post-herpetic neuralgia>  Offered Neurology appt but she doesn't want this now, discomfort is intermittent, using OTC analgesics prn...  VIT D Deficiency>  New prob w/ Vit D level = 14; rec 50K weekly to start now & stay on this.Marland KitchenMarland Kitchen

## 2010-12-04 ENCOUNTER — Other Ambulatory Visit: Payer: Self-pay | Admitting: Pulmonary Disease

## 2010-12-04 ENCOUNTER — Other Ambulatory Visit (INDEPENDENT_AMBULATORY_CARE_PROVIDER_SITE_OTHER): Payer: BC Managed Care – PPO

## 2010-12-04 DIAGNOSIS — Z0000 Encounter for general adult medical examination without abnormal findings: Secondary | ICD-10-CM

## 2010-12-04 LAB — CBC WITH DIFFERENTIAL/PLATELET
Basophils Absolute: 0 10*3/uL (ref 0.0–0.1)
Eosinophils Relative: 0.5 % (ref 0.0–5.0)
Lymphocytes Relative: 32.4 % (ref 12.0–46.0)
MCV: 95.3 fl (ref 78.0–100.0)
Monocytes Absolute: 0.7 10*3/uL (ref 0.1–1.0)
Neutrophils Relative %: 57.5 % (ref 43.0–77.0)

## 2010-12-04 LAB — TSH: TSH: 1.21 u[IU]/mL (ref 0.35–5.50)

## 2010-12-04 LAB — LIPID PANEL
Total CHOL/HDL Ratio: 4
VLDL: 16.4 mg/dL (ref 0.0–40.0)

## 2010-12-04 LAB — LDL CHOLESTEROL, DIRECT: Direct LDL: 168.6 mg/dL

## 2010-12-04 LAB — HEPATIC FUNCTION PANEL
AST: 21 U/L (ref 0–37)
Alkaline Phosphatase: 52 U/L (ref 39–117)

## 2010-12-04 LAB — BASIC METABOLIC PANEL
Chloride: 106 mEq/L (ref 96–112)
Potassium: 4.3 mEq/L (ref 3.5–5.1)

## 2010-12-05 ENCOUNTER — Encounter: Payer: Self-pay | Admitting: Pulmonary Disease

## 2010-12-05 DIAGNOSIS — E559 Vitamin D deficiency, unspecified: Secondary | ICD-10-CM | POA: Insufficient documentation

## 2010-12-06 ENCOUNTER — Other Ambulatory Visit: Payer: Self-pay | Admitting: *Deleted

## 2010-12-06 MED ORDER — ROSUVASTATIN CALCIUM 10 MG PO TABS: 10.0000 mg | ORAL_TABLET | Freq: Every day | ORAL | Status: DC

## 2010-12-06 MED ORDER — VITAMIN D (ERGOCALCIFEROL) 1.25 MG (50000 UNIT) PO CAPS: 50000.0000 [IU] | ORAL_CAPSULE | ORAL | Status: DC

## 2010-12-18 ENCOUNTER — Telehealth: Payer: Self-pay | Admitting: Pulmonary Disease

## 2010-12-18 NOTE — Telephone Encounter (Signed)
Called for [employer] PA through [employer] at [phone]. Member ID # is [id number]. Omnaris is not covered. The covered alternatives that require NO PA are:  Nasonex, Fluticasone and Traimcinolone.  I have left a msg asking the pt to call. We need to know if she has ever tried any of the covered medications above and then send a new rx to her pharmacy.

## 2010-12-22 ENCOUNTER — Other Ambulatory Visit: Payer: Self-pay | Admitting: Pulmonary Disease

## 2011-01-01 MED ORDER — MOMETASONE FUROATE 50 MCG/ACT NA SUSP: 2.0000 | Freq: Every day | NASAL | Status: DC

## 2011-01-01 NOTE — Telephone Encounter (Signed)
Pt says she has tried flonase in the past and did not like it. She is willing to try Nasonex and a new rx for this medication will be sent to her pharmacy.  Py instructed to call the office if the new medication does not work for her. Pt verbalized understanding.

## 2011-02-14 ENCOUNTER — Emergency Department (HOSPITAL_COMMUNITY): Payer: BC Managed Care – PPO

## 2011-02-14 ENCOUNTER — Encounter (HOSPITAL_COMMUNITY): Payer: Self-pay | Admitting: Emergency Medicine

## 2011-02-14 ENCOUNTER — Emergency Department (INDEPENDENT_AMBULATORY_CARE_PROVIDER_SITE_OTHER)
Admission: EM | Admit: 2011-02-14 | Discharge: 2011-02-14 | Disposition: A | Discharge status: Home / Self Care | Payer: BC Managed Care – PPO | Source: Home / Self Care

## 2011-02-14 DIAGNOSIS — M25559 Pain in unspecified hip: Secondary | ICD-10-CM

## 2011-02-14 DIAGNOSIS — M25551 Pain in right hip: Secondary | ICD-10-CM

## 2011-02-14 DIAGNOSIS — M62838 Other muscle spasm: Secondary | ICD-10-CM

## 2011-02-14 MED ORDER — KETOROLAC TROMETHAMINE 60 MG/2ML IM SOLN
INTRAMUSCULAR | Status: AC
  Filled 2011-02-14: qty 2

## 2011-02-14 MED ORDER — METHOCARBAMOL 500 MG PO TABS: 500.0000 mg | ORAL_TABLET | Freq: Four times a day (QID) | ORAL | Status: AC

## 2011-02-14 MED ORDER — KETOROLAC TROMETHAMINE 60 MG/2ML IM SOLN
60.0000 mg | Freq: Once | INTRAMUSCULAR | Status: AC
  Administered 2011-02-14: 60 mg via INTRAMUSCULAR

## 2011-02-14 MED ORDER — IBUPROFEN 800 MG PO TABS: 800.0000 mg | ORAL_TABLET | Freq: Three times a day (TID) | ORAL | Status: AC

## 2011-02-14 NOTE — ED Notes (Signed)
Pt  Here with right low back pain that radiates down hip and leg that started x 2wk.pt states the pain is getting progressively worse with limping.pt denies injury or surgery.pt reports of shingles on right side x 2 yrs ago. But no outbreak.

## 2011-02-14 NOTE — ED Provider Notes (Signed)
History     CSN: [id number] Arrival date & time: 02/14/2011  8:07 PM   First MD Initiated Contact with Patient 02/14/11 2115      Chief Complaint  Patient presents with  . Leg Pain  . Back Pain  . Hip Pain    (Consider location/radiation/quality/duration/timing/severity/associated sxs/prior treatment) Patient is a 50 y.o. female presenting with hip pain. The history is provided by the patient and medical records. No language interpreter was used.  Hip Pain This is a new problem. The current episode started more than 2 days ago. The problem has been gradually worsening. Pertinent negatives include no chest pain, no abdominal pain and no shortness of breath. The symptoms are aggravated by walking and twisting. The symptoms are relieved by narcotics.   Pt with constant right hip, inner thigh pain radiating to right knee X 3 days. States pain worse with ab/adduction of hip, walking. Pain better with lying on it. No back pain, knee pain. State No change in physical activity, trauma to hip. Reports "numbness" inner thigh but sensation intact. No N/V, rash, abd pain, fevers, bruising, swelling, redness, bulge in groin, weakness, urinary complaints. Tried vicodin rx'd to her and a family member's percocet with relief. Has not tried anything else. Per med record review pt with similar complaint in Aug 2012, thought to be related to the right sided shingles in T5-T6 that she has several years ago. Was offered neuro evaluation, neurontin. Pt states this pain is different.   Past Medical History  Diagnosis Date  . Allergic rhinitis   . Snoring   . Mild hypertension   . Atypical chest pain   . Hypercholesterolemia   . Obesity   . GERD (gastroesophageal reflux disease)   . Somatic dysfunction   . Shingles   . Postherpetic neuralgia     History reviewed. No pertinent past surgical history.  History reviewed. No pertinent family history.  History  Substance Use Topics  . Smoking status:  Former Smoker    Types: Cigarettes    Quit date: 04/07/2006  . Smokeless tobacco: Not on file   Comment: 1-2 cigs per day  . Alcohol Use: No     social use    OB History    Grav Para Term Preterm Abortions TAB SAB Ect Mult Living                  Review of Systems  Constitutional: Negative for fever.  Respiratory: Negative for shortness of breath and wheezing.   Cardiovascular: Negative for chest pain and leg swelling.  Gastrointestinal: Negative for vomiting, abdominal pain and abdominal distention.  Musculoskeletal: Positive for myalgias. Negative for back pain, joint swelling and gait problem.  Skin: Negative for rash.    Allergies  Review of patient's allergies indicates no known allergies.  Home Medications   Current Outpatient Rx  Name Route Sig Dispense Refill  . VITAMIN D (ERGOCALCIFEROL) 50000 UNITS PO CAPS Oral Take 1 capsule (50,000 Units total) by mouth every 7 (seven) days. 4 capsule 11  . HYDROCODONE-ACETAMINOPHEN 5-500 MG PO TABS Oral Take 1 tablet by mouth every 8 (eight) hours as needed for pain. 100 tablet 5  . HYDROXYZINE HCL 10 MG PO TABS Oral Take 10 mg by mouth 3 (three) times daily as needed.      . MOMETASONE FUROATE 50 MCG/ACT NA SUSP Nasal Place 2 sprays into the nose daily. 17 g 6    STOP OMNARIS.  [name] ROSUVASTATIN CALCIUM 10 MG PO TABS  Oral Take 1 tablet (10 mg total) by mouth at bedtime. 30 tablet 11  . ZOLPIDEM TARTRATE 10 MG PO TABS  TAKE ONE TABLET BY MOUTH AT BEDTIME AS NEEDED FOR SLEEP 30 tablet 5    Pulse 81  Temp(Src) 98.9 F (37.2 C) (Oral)  Resp 18  SpO2 99%  Physical Exam  Nursing note and vitals reviewed. Constitutional: She is oriented to person, place, and time. She appears well-developed and well-nourished. No distress.       Appears uncomfortable  HENT:  Head: Normocephalic and atraumatic.  Eyes: EOM are normal. Pupils are equal, round, and reactive to light.  Neck: Normal range of motion.  Cardiovascular: Normal rate,  regular rhythm and normal heart sounds.   No murmur heard. Pulmonary/Chest: Effort normal and breath sounds normal. No respiratory distress. She has no wheezes.  Abdominal: Soft. Bowel sounds are normal. She exhibits no distension. There is no tenderness. There is no rebound and no guarding.  Musculoskeletal: She exhibits tenderness.       Lumbar back: Normal. She exhibits no tenderness, no bony tenderness and no spasm.       No signs of trauma R hip. No bruising, erythema, rash. Diffuse muscular tenderness over gluteal muscles, down IT band, quadriceps. pain with passive abduction/adduction of leg. No pain with int/ext rotation hip. No tenderness at sciatic notch. Roll test for muscle spasm positive. Flexion/extension knee WNL. Knee joint NT, stable. Motor strenght flexion/ext hip 5/5. Sensation to LT intact. DP 2+ antalgic gait.   Neurological: She is alert and oriented to person, place, and time.  Skin: Skin is warm and dry. No rash noted.  Psychiatric: She has a normal mood and affect. Her behavior is normal. Judgment and thought content normal.    ED Course  Procedures (including critical care time)  Labs Reviewed - No data to display No results found.   No diagnosis found.    MDM  [place] narcotic database queried has multiple vicodin rx's from [facility] Dr. Kriste Basque,  most recently rx for vicodin #90 written 10/16 with 6 refills.   Pt unable to tolerate imaging due to muscle spasm.  No signs of infection. Fracture, sciatica, hernia, shingles. Will d/c home with nsaids muscle relaxants. D/w pt to f/u with pmd re narcotics.   Prev records reviewed OP visit at Dr. Jodelle Green  ~ December 02, 2010:  Her CC is intermittent pain in right leg which she thinks is coming from the Shingles she had in 2010 (T5-6 on right); I offered med rx w/ Neurontin vs Neuro eval from DrWillis et al & she will think about it & let me know, otherw continue current pain mangement...           Danella Maiers  [facility] 02/14/11 2215

## 2011-02-18 ENCOUNTER — Telehealth: Payer: Self-pay | Admitting: Pulmonary Disease

## 2011-02-18 DIAGNOSIS — B0229 Other postherpetic nervous system involvement: Secondary | ICD-10-CM

## 2011-02-18 NOTE — Telephone Encounter (Signed)
I spoke with pt and she states she would like a referral to neurology ASAP. Pt states he pain in her back is beginning to spread down to her legs. Pt states she just hurt so bad. Please advise Dr. Kriste Basque, thanks  Carver Fila, CMA

## 2011-02-18 NOTE — Telephone Encounter (Signed)
Per SN: okay to refer to Dr Modesto Charon.  Called spoke with patient, advised of SN's recs.  Referral placed but will not be done until tomorrow since it is after 5pm.  Pt is okay with this and verbalized her understanding.  Referral placed as ASAP.

## 2011-02-19 ENCOUNTER — Encounter: Payer: Self-pay | Admitting: Neurology

## 2011-03-11 ENCOUNTER — Ambulatory Visit: Payer: BC Managed Care – PPO | Admitting: Neurology

## 2011-03-20 ENCOUNTER — Ambulatory Visit: Payer: BC Managed Care – PPO | Admitting: Neurology

## 2011-07-08 ENCOUNTER — Other Ambulatory Visit: Payer: Self-pay | Admitting: Pulmonary Disease

## 2011-07-22 ENCOUNTER — Telehealth: Payer: Self-pay | Admitting: Pulmonary Disease

## 2011-07-22 MED ORDER — HYDROXYZINE HCL 10 MG PO TABS: 10.0000 mg | ORAL_TABLET | Freq: Three times a day (TID) | ORAL | Status: DC | PRN

## 2011-07-22 NOTE — Telephone Encounter (Signed)
I spoke with pt and she is scheduled to come in 08/05/11 at 12:00 to be seen and atarax refill has been sent to the pharmacy

## 2011-07-22 NOTE — Telephone Encounter (Signed)
Called, spoke with pt.  States she is having a problem with her allergies - c/o itching all over her body, watery eyes, eyes are swelling, PND with green mucus, and blowing nose with streaks of bright red blood and dark red blood at times.  Denies cough. States symptoms have worsened over the past 3-4 mo.  Requesting something for itching and her other symptoms.  Pt has atarax on her med list but cannot remember if this is what she has taken before that helped with the itching.  Also,pt requesting rxs for vicodin and ambien.  She was last seen on 12/02/10.  Vicodin was last given on 12/02/10 # 100 x 5.  Ambien was last sent on 12/22/10 #30 x 5.    Pt would like to pick up all rxs.   Dr. Kriste Basque, pls advise.  Thank you.    nkda - verified

## 2011-07-22 NOTE — Telephone Encounter (Signed)
Per SN---she will need ov with SN for refills of these meds.  These meds require ov every 6 months for refills.  Ok to use slot on 4/30 for ov.  thanks

## 2011-07-22 NOTE — Telephone Encounter (Signed)
We can refill the atarax and she can use this until ov.  Thanks

## 2011-07-22 NOTE — Telephone Encounter (Signed)
Will Dr. Kriste Basque give her something for sxs?  Or does he prefer to wait until she is seen?  Pls advise. Thank you.

## 2011-08-05 ENCOUNTER — Encounter: Payer: Self-pay | Admitting: Pulmonary Disease

## 2011-08-05 ENCOUNTER — Ambulatory Visit (INDEPENDENT_AMBULATORY_CARE_PROVIDER_SITE_OTHER): Payer: BC Managed Care – PPO | Admitting: Pulmonary Disease

## 2011-08-05 VITALS — BP 140/98 | HR 75 | Temp 97.0°F | Ht 66.0 in | Wt 234.0 lb

## 2011-08-05 DIAGNOSIS — E669 Obesity, unspecified: Secondary | ICD-10-CM

## 2011-08-05 DIAGNOSIS — K219 Gastro-esophageal reflux disease without esophagitis: Secondary | ICD-10-CM

## 2011-08-05 DIAGNOSIS — I10 Essential (primary) hypertension: Secondary | ICD-10-CM

## 2011-08-05 DIAGNOSIS — M545 Low back pain, unspecified: Secondary | ICD-10-CM | POA: Insufficient documentation

## 2011-08-05 DIAGNOSIS — E7800 Pure hypercholesterolemia, unspecified: Secondary | ICD-10-CM

## 2011-08-05 DIAGNOSIS — B0229 Other postherpetic nervous system involvement: Secondary | ICD-10-CM

## 2011-08-05 DIAGNOSIS — J309 Allergic rhinitis, unspecified: Secondary | ICD-10-CM

## 2011-08-05 DIAGNOSIS — M999 Biomechanical lesion, unspecified: Secondary | ICD-10-CM

## 2011-08-05 MED ORDER — ROSUVASTATIN CALCIUM 10 MG PO TABS: 10.0000 mg | ORAL_TABLET | Freq: Every day | ORAL | Status: DC

## 2011-08-05 MED ORDER — VITAMIN D (ERGOCALCIFEROL) 1.25 MG (50000 UNIT) PO CAPS: 50000.0000 [IU] | ORAL_CAPSULE | ORAL | Status: DC

## 2011-08-05 MED ORDER — HYDROXYZINE HCL 10 MG PO TABS: 10.0000 mg | ORAL_TABLET | Freq: Three times a day (TID) | ORAL | Status: DC | PRN

## 2011-08-05 MED ORDER — MOMETASONE FUROATE 50 MCG/ACT NA SUSP: 2.0000 | Freq: Every day | NASAL | Status: DC

## 2011-08-05 MED ORDER — ZOLPIDEM TARTRATE 10 MG PO TABS: 10.0000 mg | ORAL_TABLET | Freq: Every evening | ORAL | Status: DC | PRN

## 2011-08-05 MED ORDER — HYDROCODONE-ACETAMINOPHEN 5-500 MG PO TABS: 1.0000 | ORAL_TABLET | Freq: Three times a day (TID) | ORAL | Status: DC | PRN

## 2011-08-05 NOTE — Progress Notes (Signed)
Subjective:    Patient ID: Jessica Lynn, female    DOB: October 16, 1960, 51 y.o.   MRN: [id number]  HPI 51 y/o BF here for an annual CPX...  ~  May10:  add-on visit due to shingles pain...  she states that she developed a painful rash on her right side about 1 month ago and went to an [facility] where she was diagnosed w/ right T5-6 shingles... she was given Valtrex and Percocet... the rash improved but the pain increased and has persisted... she ret to the [facility] and was given more Percocet... she now c/o persistant burning pain in this right T5-6 distrib, and she rates this a 6-7/10 in severity... given Depo80/ Dosepak, Neurontin, Vicodin...  ~  September 19, 2008:  Shingles eruption resolved w/ min scarring... pain improved too- taking Neurontin 300Bid and Vicodin Prn only (prev needed 3-4 per day)... overall felling well, difficult time w/ diet + exercise...  weight 238#- no change & we discussed diet + exercise program.. also hx incr chol & she agrees to try statin Rx if FLP looks similar today...  ~  November 28, 2009:  27mo ROV> she's had a good yr but c/o incr allergy symptoms w/ sneezing, nasal congestion, and itching all over> we discussed Rx w/ Omnaris, Saline, & Atarax- next step= allergy testing... BP stable w/ low sodium diet but she needs to lose wt or face meds in the future & she understands this... similarly she refuses Statin Rx for Chol but needs to lose substantial weight... requests refills Vicodin, Ambien today.  ~  December 02, 2010:  Yearly ROV & Jessica Lynn has remained stable> the only entry into the EMR during the past yr was refill request for her Ambien;  BP controlled on diet alone but weight is up 12# to 242# today;  She notes occas intermittent CP, dizziness, DOE, etc; but she denies palpit, SOB, edema;  She has refused meds for her Lipids & was asked to improve her low chol/ low fat diet & get weight down...    Her CC is intermittent pain in right leg which she thinks is coming from the  Shingles she had in 2010 (T5-6 on right); I offered med rx w/ Neurontin vs Neuro eval from DrWillis et al & she will think about it & let me know, otherw continue current pain mangement...    She did see DrMarshall, GYN for check up & Pelvic sonar showed enlarged uterus w/ an ant uterine fibroid; she is holding off on surg...  ~  August 05, 2011:  53mo ROV & Jessica Lynn's CC is pain- back pain, "pinched nerve in my back", and pain all over including ?neck & arms she says; we have her on Vicodin up to 3/d- prev written for post herpetic neuralgia;  When last seen 8/12 she was referred to Neuro, DrWillis but she couldn't get in for several weeks so she went to her Orthopedist, Building services engineer; she sates she couldn't walk & was told it was her siatic nerve w/ "pinched nerve in back" but didn't have MRI etc; she was given Pred, musc reloaxer, Percocet (which she says she threw away);  With all her persistent symptoms she is advised to f/u w/ DrHilts for further eval & prob MRI etc;  For now she requests to continue the Vicodin- I have allowed her to have up to 3/d, #90 per month, no early refills...    In the meanwhile she has run out of her other meds including Crestor10, VitD 50K,  Ambien10, Atarax10, Nasonex> we have refilled all these meds today + he Vicodin;  See prob list below>>   PROBLEM LIST:    << PROBLEM LIST UPDATED 08/05/11 >>  ALLERGIC RHINITIS (ICD-477.9) - she uses OTC antihistamines Prn, but c/o increased symptoms and we discussed adding NASONEX Qhs, Saline Prn, and ATARAX 10mg  Prn itching...  Hx of SNORING (ICD-786.09) - Hx snoring and ? of OSA- she was rec to take a sleep study but she declined... we discussed sleep hygiene & the importance of weight reduction...  HYPERTENSION, MILD (ICD-401.1) - BP's up to 150/90 in the past and improved on diet Rx... ~  8/12:  BP= 120/82 not on med Rx and encouraged to elim sodium & lose weight> denies HA, visual changes, palipit, dizziness, syncope, dyspnea, edema,  etc... ~  4/13:  BP= 140/98 & she is reminded to elim sodium, & get wt down; otherw she will need to consider antihypertensive medication...  CHEST PAIN, ATYPICAL (ICD-786.59) - hx mult somatic complaints and CWP in the past...  ~  baseline EKG w/ NSR (rare PVC), WNL.[name].  ~  baseline CXR w/ clear lungs, NAD...  HYPERCHOLESTEROLEMIA (ICD-272.0) - we have perscribed Simvastatin 40mg /d twice in the past (8/07 & 8/08) & Crestor10 (6/10) but she never followed through & stopped the meds... we have discussed low chol/ low fat diet & weight reduction strategies...  ~  FLP 8/07 on diet showed TChol 245, TG 113, HDL 47, LDL 169... rec to start Simva40- never did. ~  FLP 8/08 on diet showed TChol 279, TG 106, HDL 60, LDL 194... rec to start Simva40- pt stopped. ~  FLP 6/10 on diet showed TChol 210, TG 106, HDL 64, LDL 130... rec> Cres10- pt stopped on her own. ~  FLP 8/11 on diet alone showed TChol 229, TG 87, HDL 61, LDL 145... refuses meds, must lose weight!!! ~  FLP 8/12 on diet alone showed TChol 239, TG 82, HDL 66, LDL 169... FLP worse, rec CRESTOR10mg /d. ~  4/13:  She stopped the Crestor10mg  on her own several months ago but says she wants to refill & stick w/ it this time...  OBESITY (ICD-278.00) - we have discussed diet/ exercise/ wt reduction strategies... ~  initial weight 7/89 = 229#... max weight 8/06 = 274#.[name]. ~  weight 8/07 = 262#,  66" tall,  BMI= 42 ~  weight 8/08 = 260# ~  weight 5/10 = 239# ~  weight 8/11 = 230# ~  Weight 8/12 = 242# ~  Weight 4/13 = 234#  GERD (ICD-530.81) - hx reflux symptoms but never had GI eval or EGD... takes OTC Prilosec vs Ranitadine as needed.  BACK PAIN >> c/o vague back pain & prev eval 11/12 by DrHilts w/ radiation into her right leg; she was given Pred, musc relaxer, & Percocet; reports some improvement but symptoms persist & she has diffuse discomfort as well; she is encouraged to f/u w/ DrHilts...  Hx of SOMATIC DYSFUNCTION (ICD-739.9) - hx mult  somatic complaints in the past... ~  6/10: c/o headaches, insomnia, mult somatic complaints- Rx w/ Vicodin Prn use + AMBIEN 10mg  Prn for sleep.  SHINGLES (ICD-053.9) & POSTHERPETIC NEURALGIA (ICD-053.19) - right T5-6 shingles 5/10 w/ post herpetic neuralgia finally improved & uses Vicodin- allowed up to 3/d Prn...  VITAMIN D DEFICIENCY >> Vit D level 8/12 = 14 and she is rec to start VIT D 50000 u weekly supplement... ~  4/13:  She stopped the VitD on her own several months  ago & is requesting to restart now...  Health Maintenance >>  ~  GI:  She is 51y/o now & ?DrMarshall checks for occult blood... ~  GYN:  Everardo Beals is her gynecologist & eval 12/11 included a pelvic ultrasound w/ uterine enlargement due to anterior fibroid;  She usually gets yearly Mamm[facility] at [facility]... ~  Immuniz:     No past surgical history on file.   Outpatient Encounter Prescriptions as of 08/05/2011  Medication Sig Dispense Refill  . HYDROcodone-acetaminophen (VICODIN) 5-500 MG per tablet Take 1 tablet by mouth every 8 (eight) hours as needed for pain. DO NOT EXCEED 3 PER DAY.  NOT TO BE FILLED BEFORE 08-09-11.  90 tablet  5  . hydrOXYzine (ATARAX/VISTARIL) 10 MG tablet Take 1 tablet (10 mg total) by mouth 3 (three) times daily as needed.  30 tablet  5  . mometasone (NASONEX) 50 MCG/ACT nasal spray Place 2 sprays into the nose daily.  17 g  5  . zolpidem (AMBIEN) 10 MG tablet Take 1 tablet (10 mg total) by mouth at bedtime as needed for sleep.  30 tablet  5  . DISCONTD: HYDROcodone-acetaminophen (VICODIN) 5-500 MG per tablet Take 1 tablet by mouth every 8 (eight) hours as needed for pain.  100 tablet  5  . DISCONTD: hydrOXYzine (ATARAX/VISTARIL) 10 MG tablet Take 1 tablet (10 mg total) by mouth 3 (three) times daily as needed.  30 tablet  0  . DISCONTD: mometasone (NASONEX) 50 MCG/ACT nasal spray Place 2 sprays into the nose daily.  17 g  6  . DISCONTD: zolpidem (AMBIEN) 10 MG tablet TAKE ONE TABLET BY MOUTH AT  BEDTIME AS NEEDED FOR SLEEP  30 tablet  5  . rosuvastatin (CRESTOR) 10 MG tablet Take 1 tablet (10 mg total) by mouth at bedtime.  30 tablet  11  . Vitamin D, Ergocalciferol, (DRISDOL) 50000 UNITS CAPS Take 1 capsule (50,000 Units total) by mouth every 7 (seven) days.  4 capsule  5  . DISCONTD: rosuvastatin (CRESTOR) 10 MG tablet Take 1 tablet (10 mg total) by mouth at bedtime.  30 tablet  11  . DISCONTD: Vitamin D, Ergocalciferol, (DRISDOL) 50000 UNITS CAPS Take 1 capsule (50,000 Units total) by mouth every 7 (seven) days.  4 capsule  11    No Known Allergies   Current Medications, Allergies, Past Medical History, Past Surgical History, Family History, and Social History were reviewed in [employer] record.    Review of Systems        The patient complains of fatigue, dyspnea on exertion, frequent headaches, and hay fever.  The patient denies fever, chills, sweats, anorexia, weakness, malaise, weight loss, sleep disorder, blurring, diplopia, eye irritation, eye discharge, vision loss, eye pain, photophobia, earache, ear discharge, tinnitus, decreased hearing, nasal congestion, nosebleeds, sore throat, hoarseness, chest pain, palpitations, syncope, orthopnea, PND, peripheral edema, cough, dyspnea at rest, excessive sputum, hemoptysis, wheezing, pleurisy, nausea, vomiting, diarrhea, constipation, change in bowel habits, abdominal pain, melena, hematochezia, jaundice, gas/bloating, indigestion/heartburn, dysphagia, odynophagia, dysuria, hematuria, urinary frequency, urinary hesitancy, nocturia, incontinence, back pain, joint pain, joint swelling, muscle cramps, muscle weakness, stiffness, arthritis, sciatica, restless legs, leg pain at night, leg pain with exertion, rash, itching, dryness, suspicious lesions, paralysis, paresthesias, seizures, tremors, vertigo, transient blindness, frequent falls, difficulty walking, depression, anxiety, memory loss, confusion, cold intolerance,  heat intolerance, polydipsia, polyphagia, polyuria, unusual weight change, abnormal bruising, bleeding, enlarged lymph nodes, urticaria, allergic rash, and recurrent infections.     Objective:  Physical Exam     WD, Obese, 51 y/o BF in NAD... GENERAL:  Alert & oriented; pleasant & cooperative... HEENT:  [place]/AT, EOM-wnl, PERRLA, EACs-clear, TMs-wnl, NOSE-clear, THROAT-clear & wnl. NECK:  Supple w/ fairROM; no JVD; normal carotid impulses w/o bruits; no thyromegaly or nodules palpated; no lymphadenopathy. CHEST:  Clear to P & A; without wheezes/ rales/ or rhonchi. HEART:  Regular Rhythm; without murmurs/ rubs/ or gallops. ABDOMEN:  Obese, soft & nontender; normal bowel sounds; no organomegaly or masses detected. EXT: without deformities, mild arthritic changes; no varicose veins/ +venous insuffic/ no edema. NEURO:  CN's intact; motor testing normal; sensory testing normal; gait normal & balance OK. DERM:  mild scarring in the right T5-6 distrib, no active lesions...  RADIOLOGY DATA:  Reviewed in the EPIC EMR & discussed w/ the patient...  LABORATORY DATA:  Reviewed in the EPIC EMR & discussed w/ the patient...   Assessment & Plan:   LBP>  She is somewhat vague & seems to get about OK; we refilled her Vicodin, told her NOT to get narcotic meds from anyone else & rec f/u DrHilts for further eval...    AR>  We refilled her NASONEX, and reviewed rec for OTC antihist in AM, and Saline nasal mist during the day...  HBP>  BP remains reasonable on "diet alone" but needs to get on track w/ diet, exercise & lose some weight!!!  Hx CP & mult somatic complaints>  She has prev been rec to take Statin but she prev refused, preferring diet alone (see below); she is encouraged to start exercise program...  CHOL>  She agreed to try CRESTOR 10mg  but stopped this inexplicably several months ago; she says she wants to restart this med now...  OBESITY>  Weight reduction is key & we reviewed calorie  restricted diet + exercise program...  GERD>  Hx mild GERD symptoms controlled w/ OTC PPI vs H2Blockers...  Hx Shingles & Post-herpetic neuralgia>  Offered Neurology appt but she doesn't want this now, discomfort is intermittent, using OTC analgesics prn...  VIT D Deficiency>  New prob 8/12 w/ Vit D level = 14; rec 50K weekly to start now & stay on this...   Patient's Medications  New Prescriptions   No medications on file  Previous Medications   No medications on file  Modified Medications   Modified Medication Previous Medication   HYDROCODONE-ACETAMINOPHEN (VICODIN) 5-500 MG PER TABLET HYDROcodone-acetaminophen (VICODIN) 5-500 MG per tablet      Take 1 tablet by mouth every 8 (eight) hours as needed for pain. DO NOT EXCEED 3 PER DAY.  NOT TO BE FILLED BEFORE 08-09-11.    Take 1 tablet by mouth every 8 (eight) hours as needed for pain.   HYDROXYZINE (ATARAX/VISTARIL) 10 MG TABLET hydrOXYzine (ATARAX/VISTARIL) 10 MG tablet      Take 1 tablet (10 mg total) by mouth 3 (three) times daily as needed.    Take 1 tablet (10 mg total) by mouth 3 (three) times daily as needed.   MOMETASONE (NASONEX) 50 MCG/ACT NASAL SPRAY mometasone (NASONEX) 50 MCG/ACT nasal spray      Place 2 sprays into the nose daily.    Place 2 sprays into the nose daily.   ROSUVASTATIN (CRESTOR) 10 MG TABLET rosuvastatin (CRESTOR) 10 MG tablet      Take 1 tablet (10 mg total) by mouth at bedtime.    Take 1 tablet (10 mg total) by mouth at bedtime.   VITAMIN D, ERGOCALCIFEROL, (DRISDOL) 50000 UNITS CAPS Vitamin  D, Ergocalciferol, (DRISDOL) 50000 UNITS CAPS      Take 1 capsule (50,000 Units total) by mouth every 7 (seven) days.    Take 1 capsule (50,000 Units total) by mouth every 7 (seven) days.   ZOLPIDEM (AMBIEN) 10 MG TABLET zolpidem (AMBIEN) 10 MG tablet      Take 1 tablet (10 mg total) by mouth at bedtime as needed for sleep.    TAKE ONE TABLET BY MOUTH AT BEDTIME AS NEEDED FOR SLEEP  Discontinued Medications   No  medications on file

## 2011-08-05 NOTE — Patient Instructions (Signed)
Today we updated your med list in our EPIC system...    Continue your current medications the same...    We refilled all your meds per your request...  For your allergies:     Use an OTC antihistamine like Claritin, Zyrtek, or Allegra daily in the AM..    Spray your nose w/ a SALINE (saltwater) spray every 1-2H as needed during the day...    And use the NASONEX 2sp in each nostril at bedtime...  Be sure to follow up w/ your Back doctor for further evaluation of your persistent back pain...    In the meanwhile, you may use the Vicodin up to 3 per day as needed for the pain...  Call for any questions...  Let's plan a follow up appt in 6 months time.Marland KitchenMarland Kitchen

## 2011-08-28 ENCOUNTER — Telehealth: Payer: Self-pay | Admitting: Pulmonary Disease

## 2011-08-28 NOTE — Telephone Encounter (Signed)
I spoke with pt and she states she was exposed to 2 students yesterday that had been dx w/ mono. She denies any current symptoms except for feeling tired but stated she always feels like this. Pt is wanting to know if SN will test her for this. Please advise Dr. Kriste Basque, thanks

## 2011-08-28 NOTE — Telephone Encounter (Signed)
Called and spoke with pt per SN---we can have her come in to check cbc and liver enzymes to see if she has active disease.  But the mono has an incubation period and if she was just exposed yesterday it will not do any good to check for this disease now.  Pt is aware that if she wants to be tested at a later date then we can do that.  Pt voiced her understanding of this and will call back for any further concerns.

## 2011-10-10 ENCOUNTER — Telehealth: Payer: Self-pay | Admitting: Pulmonary Disease

## 2011-10-10 NOTE — Telephone Encounter (Signed)
Approval for the zolpidem 10mg   #30  Has been approved from 09/19/2011 through 10/09/2012.  Pharmacy and the pt are aware.

## 2012-01-23 ENCOUNTER — Ambulatory Visit: Payer: BC Managed Care – PPO | Admitting: Pulmonary Disease

## 2012-01-28 ENCOUNTER — Encounter: Payer: Self-pay | Admitting: *Deleted

## 2012-02-02 ENCOUNTER — Encounter: Payer: Self-pay | Admitting: Pulmonary Disease

## 2012-02-02 ENCOUNTER — Ambulatory Visit: Payer: BC Managed Care – PPO | Admitting: Pulmonary Disease

## 2012-02-02 ENCOUNTER — Ambulatory Visit (INDEPENDENT_AMBULATORY_CARE_PROVIDER_SITE_OTHER): Payer: BC Managed Care – PPO | Admitting: Pulmonary Disease

## 2012-02-02 VITALS — BP 152/100 | HR 77 | Temp 98.3°F | Ht 66.0 in | Wt 235.0 lb

## 2012-02-02 DIAGNOSIS — I10 Essential (primary) hypertension: Secondary | ICD-10-CM

## 2012-02-02 DIAGNOSIS — F419 Anxiety disorder, unspecified: Secondary | ICD-10-CM

## 2012-02-02 DIAGNOSIS — E7800 Pure hypercholesterolemia, unspecified: Secondary | ICD-10-CM

## 2012-02-02 DIAGNOSIS — E559 Vitamin D deficiency, unspecified: Secondary | ICD-10-CM

## 2012-02-02 DIAGNOSIS — M545 Low back pain, unspecified: Secondary | ICD-10-CM

## 2012-02-02 DIAGNOSIS — J309 Allergic rhinitis, unspecified: Secondary | ICD-10-CM

## 2012-02-02 DIAGNOSIS — K219 Gastro-esophageal reflux disease without esophagitis: Secondary | ICD-10-CM

## 2012-02-02 DIAGNOSIS — J019 Acute sinusitis, unspecified: Secondary | ICD-10-CM

## 2012-02-02 DIAGNOSIS — E669 Obesity, unspecified: Secondary | ICD-10-CM

## 2012-02-02 MED ORDER — HYDROXYZINE HCL 10 MG PO TABS: 10.0000 mg | ORAL_TABLET | Freq: Three times a day (TID) | ORAL | Status: DC | PRN

## 2012-02-02 MED ORDER — AMOXICILLIN-POT CLAVULANATE 875-125 MG PO TABS: 1.0000 | ORAL_TABLET | Freq: Two times a day (BID) | ORAL | Status: DC

## 2012-02-02 MED ORDER — ROSUVASTATIN CALCIUM 10 MG PO TABS: 10.0000 mg | ORAL_TABLET | Freq: Every day | ORAL | Status: DC

## 2012-02-02 MED ORDER — VITAMIN D (ERGOCALCIFEROL) 1.25 MG (50000 UNIT) PO CAPS: 50000.0000 [IU] | ORAL_CAPSULE | ORAL | Status: DC

## 2012-02-02 MED ORDER — MOMETASONE FUROATE 50 MCG/ACT NA SUSP: 2.0000 | Freq: Every day | NASAL | Status: DC

## 2012-02-02 MED ORDER — ZOLPIDEM TARTRATE 10 MG PO TABS: 10.0000 mg | ORAL_TABLET | Freq: Every evening | ORAL | Status: DC | PRN

## 2012-02-02 MED ORDER — HYDROCODONE-ACETAMINOPHEN 5-500 MG PO TABS: 1.0000 | ORAL_TABLET | Freq: Three times a day (TID) | ORAL | Status: DC | PRN

## 2012-02-02 NOTE — Patient Instructions (Addendum)
Today we updated your med list in our EPIC system...    Continue your current medications the same...    We refilled your meds per request...  For your Sinusitis>    Take the AUGMENTIN antibiotic one tab twice daily til gone...    Get some OTC MUCINEX- 600mg  take 2 tabs twice daily w/ lots of fluids...    Also start an Antihist in the AM eg. Claritin/ Zyrtek/ Allegra (all avail OTC)...    Then use a SALINE NASAL MIST> spray in nose every 1-2H while awake as needed...    You may still use the Nasonex at bedtime...  We will arrange for an Allergy eval at [facility] clinic...  Please return to our lab one morning this week for your FASTING blood work...    We will call you w/ the results...  Let's plan a follow up visit in 6 months time.Marland KitchenMarland Kitchen

## 2012-02-02 NOTE — Progress Notes (Signed)
Subjective:    Patient ID: Jessica Lynn, female    DOB: October 16, 1960, 51 y.o.   MRN: [id number]  HPI 51 y/o BF here for an annual CPX...  ~  May10:  add-on visit due to shingles pain...  she states that she developed a painful rash on her right side about 1 month ago and went to an [facility] where she was diagnosed w/ right T5-6 shingles... she was given Valtrex and Percocet... the rash improved but the pain increased and has persisted... she ret to the [facility] and was given more Percocet... she now c/o persistant burning pain in this right T5-6 distrib, and she rates this a 6-7/10 in severity... given Depo80/ Dosepak, Neurontin, Vicodin...  ~  September 19, 2008:  Shingles eruption resolved w/ min scarring... pain improved too- taking Neurontin 300Bid and Vicodin Prn only (prev needed 3-4 per day)... overall felling well, difficult time w/ diet + exercise...  weight 238#- no change & we discussed diet + exercise program.. also hx incr chol & she agrees to try statin Rx if FLP looks similar today...  ~  November 28, 2009:  27mo ROV> she's had a good yr but c/o incr allergy symptoms w/ sneezing, nasal congestion, and itching all over> we discussed Rx w/ Omnaris, Saline, & Atarax- next step= allergy testing... BP stable w/ low sodium diet but she needs to lose wt or face meds in the future & she understands this... similarly she refuses Statin Rx for Chol but needs to lose substantial weight... requests refills Vicodin, Ambien today.  ~  December 02, 2010:  Yearly ROV & Jessica Lynn has remained stable> the only entry into the EMR during the past yr was refill request for her Ambien;  BP controlled on diet alone but weight is up 12# to 242# today;  She notes occas intermittent CP, dizziness, DOE, etc; but she denies palpit, SOB, edema;  She has refused meds for her Lipids & was asked to improve her low chol/ low fat diet & get weight down...    Her CC is intermittent pain in right leg which she thinks is coming from the  Shingles she had in 2010 (T5-6 on right); I offered med rx w/ Neurontin vs Neuro eval from DrWillis et al & she will think about it & let me know, otherw continue current pain mangement...    She did see DrMarshall, GYN for check up & Pelvic sonar showed enlarged uterus w/ an ant uterine fibroid; she is holding off on surg...  ~  August 05, 2011:  53mo ROV & Jessica Lynn's CC is pain- back pain, "pinched nerve in my back", and pain all over including ?neck & arms she says; we have her on Vicodin up to 3/d- prev written for post herpetic neuralgia;  When last seen 8/12 she was referred to Neuro, DrWillis but she couldn't get in for several weeks so she went to her Orthopedist, Building services engineer; she sates she couldn't walk & was told it was her siatic nerve w/ "pinched nerve in back" but didn't have MRI etc; she was given Pred, musc reloaxer, Percocet (which she says she threw away);  With all her persistent symptoms she is advised to f/u w/ DrHilts for further eval & prob MRI etc;  For now she requests to continue the Vicodin- I have allowed her to have up to 3/d, #90 per month, no early refills...    In the meanwhile she has run out of her other meds including Crestor10, VitD 50K,  Ambien10, Atarax10, Nasonex> we have refilled all these meds today + he Vicodin;  See prob list below>>  ~  February 02, 2012:  53mo ROV & Jessica Lynn says that her eye doctor said she has "a film of allergies on my eyeballs", "now I can taste & smell it"- c/o frontal HA, sinus congestion, drainage, sl cough, sweats; she denies sputum, f/c, SOB, CP, etc... We decided to treat w/ Antihist Qam, Nasal saline mist, Nasonex Qhs; plus Augmentin 875mg Bid, Mucinex-2Bid w/ fluids, & refer to the [facility]...     HBP> not currently on meds; BP=152/100 but she says it's stress; advised diet, wt reduction, no salt, & start meds if not improved in follow up.    Chol> on Cres10 & says she's taking it daily; FLP showed TChol 242, TG 99, HDL 60, LDL 161; Rec  to incr to Crestor 20mg /d + better diet & wt reduction.    Obesity> wt=235# (no change); we reviewed diet, exercise & wt reduction strategies...    LBP> on Vicodin as needed; she says taking 2-3 per day; she has mult somatic complaints...    Vit D Defic> supposed to be taking Vit D 50K weekly; Vit D level = 24 (improved from 14), continue same med... We reviewed prob list, meds, xrays and labs> see below for updates >> she declines the 2013 Flu vaccine... LABS 11/13:  FLP- not at goals on Cres10;  Chems- wnl;  CBC- ok w/ Hg=12.5;  TSH=1.15;  VitD=24         PROBLEM LIST:     ALLERGIC RHINITIS (ICD-477.9) - she uses OTC antihistamines Prn, but c/o increased symptoms and we discussed adding NASONEX Qhs, Saline Prn, and ATARAX 10mg  Prn itching... ~  10/13:  She notes that her eye doctor said she has "a film of allergies on my eyeballs", "now I can taste & smell it"- c/o frontal HA, sinus congestion, drainage, sl cough, sweats; she denies sputum, f/c, SOB, CP, etc... We decided to treat w/ Antihist Qam, Nasal saline mist, Nasonex Qhs; plus Augmentin 875mg Bid, Mucinex-2Bid w/ fluids, & refer to the [facility]...   Hx of SNORING (ICD-786.09) - Hx snoring and ? of OSA- she was rec to take a sleep study but she declined... we discussed sleep hygiene & the importance of weight reduction...  HYPERTENSION, MILD (ICD-401.1) - BP's up to 150/90 in the past and improved on diet Rx... ~  8/12:  BP= 120/82 not on med Rx and encouraged to elim sodium & lose weight> denies HA, visual changes, palipit, dizziness, syncope, dyspnea, edema, etc... ~  4/13:  BP= 140/98 & she is reminded to elim sodium, & get wt down; otherw she will need to consider antihypertensive medication... ~  10/13:  BP= 152/100 but she thinks it's stress related; told her to elim sodium, get on diet, get wt down, or we'll start meds next visit...  CHEST PAIN, ATYPICAL (ICD-786.59) - hx mult somatic complaints and CWP in the past...    ~  baseline EKG w/ NSR (rare PVC), WNL.[name].  ~  baseline CXR w/ clear lungs, NAD...  HYPERCHOLESTEROLEMIA (ICD-272.0) - we have perscribed Simvastatin 40mg /d twice in the past (8/07 & 8/08) & Crestor10 (6/10) but she never followed through & stopped the meds... we have discussed low chol/ low fat diet & weight reduction strategies...  ~  FLP 8/07 on diet showed TChol 245, TG 113, HDL 47, LDL 169... rec to start Simva40- never did. ~  FLP 8/08 on  diet showed TChol 279, TG 106, HDL 60, LDL 194... rec to start Simva40- pt stopped. ~  FLP 6/10 on diet showed TChol 210, TG 106, HDL 64, LDL 130... rec> Cres10- pt stopped on her own. ~  FLP 8/11 on diet alone showed TChol 229, TG 87, HDL 61, LDL 145... refuses meds, must lose weight!!! ~  FLP 8/12 on diet alone showed TChol 239, TG 82, HDL 66, LDL 169... FLP worse, rec CRESTOR10mg /d. ~  4/13:  She stopped the Crestor10mg  on her own several months ago but says she wants to refill & stick w/ it this time... ~  FLP 10/13 on Cres10 showed TChol 242, TG 99, HDL 60, LDL 161... Says she's taking it daily, therefore incr CRESTOR20.  OBESITY (ICD-278.00) - we have discussed diet/ exercise/ wt reduction strategies... ~  initial weight 7/89 = 229#... max weight 8/06 = 274#.[name]. ~  weight 8/07 = 262#,  66" tall,  BMI= 42 ~  weight 8/08 = 260# ~  weight 5/10 = 239# ~  weight 8/11 = 230# ~  Weight 8/12 = 242# ~  Weight 4/13 = 234# ~  Weight 10/13 = 235#  GERD (ICD-530.81) - hx reflux symptoms but never had GI eval or EGD... takes OTC Prilosec vs Ranitadine as needed.  BACK PAIN >> c/o vague back pain & prev eval 11/12 by DrHilts w/ radiation into her right leg; she was given Pred, musc relaxer, & Percocet; reports some improvement but symptoms persist & she has diffuse discomfort as well; she is encouraged to f/u w/ DrHilts...  Hx of SOMATIC DYSFUNCTION (ICD-739.9) - hx mult somatic complaints in the past... ~  6/10: c/o headaches, insomnia, mult somatic  complaints- Rx w/ Vicodin Prn use + AMBIEN 10mg  Prn for sleep.  SHINGLES (ICD-053.9) & POSTHERPETIC NEURALGIA (ICD-053.19) - right T5-6 shingles 5/10 w/ post herpetic neuralgia finally improved & uses Vicodin- allowed up to 3/d Prn...  VITAMIN D DEFICIENCY >> Vit D level 8/12 = 14 and she is rec to start VIT D 50000 u weekly supplement... ~  4/13:  She stopped the VitD on her own several months ago & is requesting to restart now... ~  10/13:  Labs showed Vit D level = 24... rec to continue the VitD 50K weekly...  Health Maintenance >>  ~  GI:  She is 51y/o now & ?DrMarshall checks for occult blood... ~  GYN:  Everardo Beals is her gynecologist & eval 12/11 included a pelvic ultrasound w/ uterine enlargement due to anterior fibroid;  She usually gets yearly M[facility]... ~  Immuniz:  She refuses the Flu vaccine...   No past surgical history on file.   Outpatient Encounter Prescriptions as of 02/02/2012  Medication Sig Dispense Refill  . HYDROcodone-acetaminophen (VICODIN) 5-500 MG per tablet Take 1 tablet by mouth every 8 (eight) hours as needed for pain. DO NOT EXCEED 3 PER DAY.  NOT TO BE FILLED BEFORE 08-09-11.  90 tablet  5  . hydrOXYzine (ATARAX/VISTARIL) 10 MG tablet Take 1 tablet (10 mg total) by mouth 3 (three) times daily as needed.  30 tablet  5  . mometasone (NASONEX) 50 MCG/ACT nasal spray Place 2 sprays into the nose daily.  17 g  5  . rosuvastatin (CRESTOR) 10 MG tablet Take 1 tablet (10 mg total) by mouth at bedtime.  30 tablet  11  . Vitamin D, Ergocalciferol, (DRISDOL) 50000 UNITS CAPS Take 1 capsule (50,000 Units total) by mouth every 7 (seven) days.  4 capsule  5  . zolpidem (AMBIEN) 10 MG tablet Take 1 tablet (10 mg total) by mouth at bedtime as needed for sleep.  30 tablet  5    No Known Allergies   Current Medications, Allergies, Past Medical History, Past Surgical History, Family History, and Social History were reviewed in [employer] record.    Review of Systems        The patient complains of fatigue, dyspnea on exertion, frequent headaches, and hay fever.  The patient denies fever, chills, sweats, anorexia, weakness, malaise, weight loss, sleep disorder, blurring, diplopia, eye irritation, eye discharge, vision loss, eye pain, photophobia, earache, ear discharge, tinnitus, decreased hearing, nasal congestion, nosebleeds, sore throat, hoarseness, chest pain, palpitations, syncope, orthopnea, PND, peripheral edema, cough, dyspnea at rest, excessive sputum, hemoptysis, wheezing, pleurisy, nausea, vomiting, diarrhea, constipation, change in bowel habits, abdominal pain, melena, hematochezia, jaundice, gas/bloating, indigestion/heartburn, dysphagia, odynophagia, dysuria, hematuria, urinary frequency, urinary hesitancy, nocturia, incontinence, back pain, joint pain, joint swelling, muscle cramps, muscle weakness, stiffness, arthritis, sciatica, restless legs, leg pain at night, leg pain with exertion, rash, itching, dryness, suspicious lesions, paralysis, paresthesias, seizures, tremors, vertigo, transient blindness, frequent falls, difficulty walking, depression, anxiety, memory loss, confusion, cold intolerance, heat intolerance, polydipsia, polyphagia, polyuria, unusual weight change, abnormal bruising, bleeding, enlarged lymph nodes, urticaria, allergic rash, and recurrent infections.     Objective:   Physical Exam     WD, Obese, 50 y/o BF in NAD... GENERAL:  Alert & oriented; pleasant & cooperative... HEENT:  [place]/AT, EOM-wnl, PERRLA, EACs-clear, TMs-wnl, NOSE-clear, THROAT-clear & wnl. NECK:  Supple w/ fairROM; no JVD; normal carotid impulses w/o bruits; no thyromegaly or nodules palpated; no lymphadenopathy. CHEST:  Clear to P & A; without wheezes/ rales/ or rhonchi. HEART:  Regular Rhythm; without murmurs/ rubs/ or gallops. ABDOMEN:  Obese, soft & nontender; normal bowel sounds; no organomegaly or masses  detected. EXT: without deformities, mild arthritic changes; no varicose veins/ +venous insuffic/ no edema. NEURO:  CN's intact; motor testing normal; sensory testing normal; gait normal & balance OK. DERM:  mild scarring in the right T5-6 distrib, no active lesions...  RADIOLOGY DATA:  Reviewed in the EPIC EMR & discussed w/ the patient...  LABORATORY DATA:  Reviewed in the EPIC EMR & discussed w/ the patient...   Assessment & Plan:    AR>  We refilled her NASONEX, and reviewed rec for OTC antihist in AM, and Saline nasal mist during the day, etc...  HBP>  BP remains sl elev but she declines meds; given one last chance for low sodium diet, wt reduction & exercise to help...  Hx CP & mult somatic complaints>  She has prev been rec to take Statin but she prev refused, preferring diet alone (see below); she is encouraged to start exercise program...  CHOL>  She agreed to try CRESTOR 10mg  but despite her insistance that she's taking it everyday her numbers are poor, therefore incr Cres20...  OBESITY>  Weight reduction is key & we reviewed calorie restricted diet + exercise program...  GERD>  Hx mild GERD symptoms controlled w/ OTC PPI vs H2Blockers...  LBP>  She is somewhat vague & seems to get about OK; we refilled her Vicodin, told her NOT to get narcotic meds from anyone else & rec f/u DrHilts for further eval...   Hx Shingles & Post-herpetic neuralgia>  Offered Neurology appt but she doesn't want this now, discomfort is intermittent, using OTC analgesics prn...  VIT D Deficiency>  New prob  8/12 w/ Vit D level = 14; placed on 50K weekly & f/u lab = 24, continue 50K per week.   Patient's Medications  New Prescriptions   AMOXICILLIN-CLAVULANATE (AUGMENTIN) 875-125 MG PER TABLET    Take 1 tablet by mouth 2 (two) times daily.  Previous Medications   No medications on file  Modified Medications   Modified Medication Previous Medication   HYDROCODONE-ACETAMINOPHEN (VICODIN) 5-500 MG  PER TABLET HYDROcodone-acetaminophen (VICODIN) 5-500 MG per tablet      Take 1 tablet by mouth every 8 (eight) hours as needed for pain. DO NOT EXCEED 3 PER DAY.  NOT TO BE FILLED BEFORE 08-09-11.    Take 1 tablet by mouth every 8 (eight) hours as needed for pain. DO NOT EXCEED 3 PER DAY.  NOT TO BE FILLED BEFORE 08-09-11.   HYDROXYZINE (ATARAX/VISTARIL) 10 MG TABLET hydrOXYzine (ATARAX/VISTARIL) 10 MG tablet      Take 1 tablet (10 mg total) by mouth 3 (three) times daily as needed.    Take 1 tablet (10 mg total) by mouth 3 (three) times daily as needed.   MOMETASONE (NASONEX) 50 MCG/ACT NASAL SPRAY mometasone (NASONEX) 50 MCG/ACT nasal spray      Place 2 sprays into the nose daily.    Place 2 sprays into the nose daily.   ROSUVASTATIN (CRESTOR) 10 MG TABLET rosuvastatin (CRESTOR) 10 MG tablet      Take 1 tablet (10 mg total) by mouth at bedtime.    Take 1 tablet (10 mg total) by mouth at bedtime.   VITAMIN D, ERGOCALCIFEROL, (DRISDOL) 50000 UNITS CAPS Vitamin D, Ergocalciferol, (DRISDOL) 50000 UNITS CAPS      Take 1 capsule (50,000 Units total) by mouth every 7 (seven) days.    Take 1 capsule (50,000 Units total) by mouth every 7 (seven) days.   ZOLPIDEM (AMBIEN) 10 MG TABLET zolpidem (AMBIEN) 10 MG tablet      Take 1 tablet (10 mg total) by mouth at bedtime as needed for sleep.    Take 1 tablet (10 mg total) by mouth at bedtime as needed for sleep.  Discontinued Medications   No medications on file

## 2012-02-05 ENCOUNTER — Ambulatory Visit: Payer: BC Managed Care – PPO | Admitting: Pulmonary Disease

## 2012-02-06 ENCOUNTER — Other Ambulatory Visit (INDEPENDENT_AMBULATORY_CARE_PROVIDER_SITE_OTHER): Payer: BC Managed Care – PPO

## 2012-02-06 DIAGNOSIS — F419 Anxiety disorder, unspecified: Secondary | ICD-10-CM

## 2012-02-06 DIAGNOSIS — I10 Essential (primary) hypertension: Secondary | ICD-10-CM

## 2012-02-06 DIAGNOSIS — K219 Gastro-esophageal reflux disease without esophagitis: Secondary | ICD-10-CM

## 2012-02-06 DIAGNOSIS — F411 Generalized anxiety disorder: Secondary | ICD-10-CM

## 2012-02-06 DIAGNOSIS — E7800 Pure hypercholesterolemia, unspecified: Secondary | ICD-10-CM

## 2012-02-06 DIAGNOSIS — E559 Vitamin D deficiency, unspecified: Secondary | ICD-10-CM

## 2012-02-06 LAB — CBC WITH DIFFERENTIAL/PLATELET
Basophils Relative: 0.5 % (ref 0.0–3.0)
Eosinophils Absolute: 0.1 10*3/uL (ref 0.0–0.7)
MCHC: 33.2 g/dL (ref 30.0–36.0)
MCV: 94 fl (ref 78.0–100.0)
Monocytes Absolute: 0.7 10*3/uL (ref 0.1–1.0)
Neutro Abs: 4.3 10*3/uL (ref 1.4–7.7)
Neutrophils Relative %: 56.3 % (ref 43.0–77.0)
RBC: 4 Mil/uL (ref 3.87–5.11)

## 2012-02-06 LAB — LIPID PANEL
Cholesterol: 242 mg/dL — ABNORMAL HIGH (ref 0–200)
Total CHOL/HDL Ratio: 4
VLDL: 19.8 mg/dL (ref 0.0–40.0)

## 2012-02-06 LAB — BASIC METABOLIC PANEL
Chloride: 104 mEq/L (ref 96–112)
Creatinine, Ser: 0.8 mg/dL (ref 0.4–1.2)
Potassium: 3.7 mEq/L (ref 3.5–5.1)
Sodium: 137 mEq/L (ref 135–145)

## 2012-02-06 LAB — LDL CHOLESTEROL, DIRECT: Direct LDL: 160.7 mg/dL

## 2012-02-06 LAB — HEPATIC FUNCTION PANEL
ALT: 18 U/L (ref 0–35)
Bilirubin, Direct: 0.1 mg/dL (ref 0.0–0.3)
Total Bilirubin: 1 mg/dL (ref 0.3–1.2)

## 2012-02-09 ENCOUNTER — Other Ambulatory Visit: Payer: Self-pay | Admitting: Pulmonary Disease

## 2012-02-09 MED ORDER — ROSUVASTATIN CALCIUM 20 MG PO TABS: 20.0000 mg | ORAL_TABLET | Freq: Every day | ORAL | Status: DC

## 2012-06-03 ENCOUNTER — Telehealth: Payer: Self-pay | Admitting: Pulmonary Disease

## 2012-06-03 MED ORDER — HYDROCODONE-ACETAMINOPHEN 5-325 MG PO TABS: 1.0000 | ORAL_TABLET | Freq: Three times a day (TID) | ORAL | Status: DC | PRN

## 2012-06-03 NOTE — Telephone Encounter (Signed)
Pt has a scheduled follow-up in April 2014 with SN. Vicodin 5/500 has been changed to Vicodin 5/325. Will call in new rx for the pt with 1 additional refill until ov. Sawe directions as before.

## 2012-07-01 ENCOUNTER — Telehealth: Payer: Self-pay | Admitting: Pulmonary Disease

## 2012-07-01 NOTE — Telephone Encounter (Signed)
Per SN---  Can schedule for cpx for next aval. Appt. thanks

## 2012-07-01 NOTE — Telephone Encounter (Signed)
I spoke with pt and she and she is wanting to know if SN is accepting new pt's. She stated he just needs to establish a PCP and he is 23. Please advised thanks

## 2012-07-01 NOTE — Telephone Encounter (Signed)
Pt son is scheduled for appt. Nothing further was needed

## 2012-07-14 ENCOUNTER — Other Ambulatory Visit (HOSPITAL_COMMUNITY): Payer: Self-pay | Admitting: Obstetrics

## 2012-07-14 DIAGNOSIS — Z1231 Encounter for screening mammogram for malignant neoplasm of breast: Secondary | ICD-10-CM

## 2012-07-22 ENCOUNTER — Ambulatory Visit (HOSPITAL_COMMUNITY)
Admission: RE | Admit: 2012-07-22 | Discharge: 2012-07-22 | Disposition: A | Discharge status: Home / Self Care | Payer: BC Managed Care – PPO | Source: Ambulatory Visit | Attending: Obstetrics | Admitting: Obstetrics

## 2012-07-22 DIAGNOSIS — Z1231 Encounter for screening mammogram for malignant neoplasm of breast: Secondary | ICD-10-CM

## 2012-08-02 ENCOUNTER — Encounter: Payer: Self-pay | Admitting: Pulmonary Disease

## 2012-08-02 ENCOUNTER — Ambulatory Visit (INDEPENDENT_AMBULATORY_CARE_PROVIDER_SITE_OTHER): Payer: BC Managed Care – PPO | Admitting: Pulmonary Disease

## 2012-08-02 VITALS — BP 152/98 | HR 76 | Temp 98.0°F | Ht 66.0 in | Wt 240.4 lb

## 2012-08-02 DIAGNOSIS — E7800 Pure hypercholesterolemia, unspecified: Secondary | ICD-10-CM

## 2012-08-02 DIAGNOSIS — I10 Essential (primary) hypertension: Secondary | ICD-10-CM

## 2012-08-02 DIAGNOSIS — J019 Acute sinusitis, unspecified: Secondary | ICD-10-CM | POA: Insufficient documentation

## 2012-08-02 DIAGNOSIS — M545 Low back pain, unspecified: Secondary | ICD-10-CM

## 2012-08-02 DIAGNOSIS — K219 Gastro-esophageal reflux disease without esophagitis: Secondary | ICD-10-CM

## 2012-08-02 DIAGNOSIS — M999 Biomechanical lesion, unspecified: Secondary | ICD-10-CM

## 2012-08-02 DIAGNOSIS — E559 Vitamin D deficiency, unspecified: Secondary | ICD-10-CM

## 2012-08-02 DIAGNOSIS — J309 Allergic rhinitis, unspecified: Secondary | ICD-10-CM

## 2012-08-02 DIAGNOSIS — E669 Obesity, unspecified: Secondary | ICD-10-CM

## 2012-08-02 MED ORDER — MOMETASONE FUROATE 50 MCG/ACT NA SUSP: 2.0000 | Freq: Every day | NASAL | Status: DC

## 2012-08-02 MED ORDER — VITAMIN D (ERGOCALCIFEROL) 1.25 MG (50000 UNIT) PO CAPS: 50000.0000 [IU] | ORAL_CAPSULE | ORAL | Status: DC

## 2012-08-02 MED ORDER — HYDROXYZINE HCL 10 MG PO TABS: 10.0000 mg | ORAL_TABLET | Freq: Three times a day (TID) | ORAL | Status: DC | PRN

## 2012-08-02 MED ORDER — LISINOPRIL-HYDROCHLOROTHIAZIDE 20-12.5 MG PO TABS: 1.0000 | ORAL_TABLET | Freq: Every day | ORAL | Status: DC

## 2012-08-02 MED ORDER — ZOLPIDEM TARTRATE 10 MG PO TABS: 10.0000 mg | ORAL_TABLET | Freq: Every evening | ORAL | Status: DC | PRN

## 2012-08-02 MED ORDER — ROSUVASTATIN CALCIUM 20 MG PO TABS: 20.0000 mg | ORAL_TABLET | Freq: Every day | ORAL | Status: DC

## 2012-08-02 MED ORDER — LEVOFLOXACIN 500 MG PO TABS: 500.0000 mg | ORAL_TABLET | Freq: Every day | ORAL | Status: DC

## 2012-08-02 MED ORDER — HYDROCODONE-ACETAMINOPHEN 5-325 MG PO TABS: 1.0000 | ORAL_TABLET | Freq: Three times a day (TID) | ORAL | Status: DC | PRN

## 2012-08-02 NOTE — Patient Instructions (Addendum)
Today we updated your med list in our EPIC system...    Continue your current medications the same...  For your sinusitis>>    We wrote for [facility] 500mg  one tab daily x10d...    Be sure to take a Probiotic while you are on this med- eg. Maurie Boettcher colon health, Insurance underwriter, etc...    Add MUCINEX OTC 600mg  - 2 tabs twice daily w/ lots of water by mouth...    Use a nasal SALINE mist every 1-2H while awake, and spray the NASONEX 2sp in each nostril at bedtime...  For your BP>>    We are starting LISINOPRIL/ Hct 20-12.5 one tab in the AM...    You must restrict Sodium/ salt from your diet...    You must also work on weight reduction...  Please restart the CRESTOR for your cholesterol...  Call for any questions...  Let's plan a follow up visit in 17mo w/ FASTING blood work..., sooner if needed for problems.Marland KitchenMarland Kitchen

## 2012-08-02 NOTE — Progress Notes (Signed)
Subjective:    Patient ID: Jessica Lynn, female    DOB: 10-16-1960, 52 y.o.   MRN: [id number]  HPI 52 y/o BF here for a 6 month check up...  ~  December 02, 2010:  Yearly ROV & [name] has remained stable> the only entry into the EMR during the past yr was refill request for her Ambien;  BP controlled on diet alone but weight is up 12# to 242# today;  She notes occas intermittent CP, dizziness, DOE, etc; but she denies palpit, SOB, edema;  She has refused meds for her Lipids & was asked to improve her low chol/ low fat diet & get weight down...    Her CC is intermittent pain in right leg which she thinks is coming from the Shingles she had in 2010 (T5-6 on right); I offered med rx w/ Neurontin vs Neuro eval from DrWillis et al & she will think about it & let me know, otherw continue current pain mangement...    She did see DrMarshall, GYN for check up & Pelvic sonar showed enlarged uterus w/ an ant uterine fibroid; she is holding off on surg...  ~  August 05, 2011:  73mo ROV & Citlalic's CC is pain- back pain, "pinched nerve in my back", and pain all over including ?neck & arms she says; we have her on Vicodin up to 3/d- prev written for post herpetic neuralgia;  When last seen 8/12 she was referred to Neuro, DrWillis but she couldn't get in for several weeks so she went to her Orthopedist, Building services engineer; she sates she couldn't walk & was told it was her siatic nerve w/ "pinched nerve in back" but didn't have MRI etc; she was given Pred, musc reloaxer, Percocet (which she says she threw away);  With all her persistent symptoms she is advised to f/u w/ DrHilts for further eval & prob MRI etc;  For now she requests to continue the Vicodin- I have allowed her to have up to 3/d, #90 per month, no early refills...    In the meanwhile she has run out of her other meds including Crestor10, VitD 50K, Ambien10, Atarax10, Nasonex> we have refilled all these meds today + he Vicodin;  See prob list below>>  ~  February 02, 2012:  41mo ROV & [name] says that her eye doctor said she has "a film of allergies on my eyeballs", "now I can taste & smell it"- c/o frontal HA, sinus congestion, drainage, sl cough, sweats; she denies sputum, f/c, SOB, CP, etc... We decided to treat w/ Antihist Qam, Nasal saline mist, Nasonex Qhs; plus Augmentin 875mg Bid, Mucinex-2Bid w/ fluids, & refer to the [facility]...     HBP> not currently on meds; BP=152/100 but she says it's stress; advised diet, wt reduction, no salt, & start meds if not improved in follow up.    Chol> on Cres10 & says she's taking it daily; FLP showed TChol 242, TG 99, HDL 60, LDL 161; Rec to incr to Crestor 20mg /d + better diet & wt reduction.    Obesity> wt=235# (no change); we reviewed diet, exercise & wt reduction strategies...    LBP> on Vicodin as needed; she says taking 2-3 per day; she has mult somatic complaints...    Vit D Defic> supposed to be taking Vit D 50K weekly; Vit D level = 24 (improved from 14), continue same med... We reviewed prob list, meds, xrays and labs> see below for updates >> she declines the 2013 Flu  vaccine... LABS 11/13:  FLP- not at goals on Cres10;  Chems- wnl;  CBC- ok w/ Hg=12.5;  TSH=1.15;  VitD=24  ~  August 02, 2012:  24mo ROV & [name] has several problems today>  1) Hx mult somatic complaints and today mentioned numerous Orthopedic issues- trigger finger w/ eval DrGramig, LBP as before, "leg messed up" etc; she has Vicodin for pain & ?seeing several of the [facility] doctors for her problems, "I need an MRI" she says.Marland KitchenMarland Kitchen  2) Sinusitis w/ c/o congestion, drainage, green mucous w/ foul odor & occas blood streaks; denies f/c/s, HA, etc; we discussed Rx w/ Levaquin, Align, Mucinex, Saline, Nasonex.Marland KitchenMarland Kitchen  3) Hypertension- her BP was elev at GYN & reads again ~150/100 today; she has not been restricting sodium, not on diet & weight is up 5# to 240# (BMI=38); we discussed low sodium weight reducing diet & exercise; Rec to start  LISINOPRIL/ Hct 20-12.5 daily...     HBP> not currently on meds; BP=152/98 today & advised to start Rx w/ LISINOPRIL/Hct 20-12.5 Qam, + diet, wt reduction, no salt, etc...    Chol> on Cres20 but she ran out 55mo ago & didn't refill; advised to refill the Crestor 20mg /d + better diet & wt reduction, etc...    Obesity> wt=240# today which is up 5# and BMI=38; we reviewed diet, exercise & wt reduction strategies...    LBP> on Vicodin as needed; she says taking 2-3 per day; she has mult somatic complaints...    Vit D Defic> supposed to be taking Vit D 50K weekly; 11/13 Vit D level = 24 (improved from 14), continue same med... We reviewed prob list, meds, xrays and labs> see below for updates >>           PROBLEM LIST:     ALLERGIC RHINITIS (ICD-477.9) - she uses OTC antihistamines Prn, but c/o increased symptoms and we discussed adding NASONEX Qhs, Saline Prn, and ATARAX 10mg  Prn itching... ~  10/13:  She notes that her eye doctor said she has "a film of allergies on my eyeballs", "now I can taste & smell it"- c/o frontal HA, sinus congestion, drainage, sl cough, sweats; she denies sputum, f/c, SOB, CP, etc... We decided to treat w/ Antihist Qam, Nasal saline mist, Nasonex Qhs; plus Augmentin 875mg Bid, Mucinex-2Bid w/ fluids, & refer to the [facility]...   Hx of SNORING (ICD-786.09) - Hx snoring and ? of OSA- she was rec to take a sleep study but she declined... we discussed sleep hygiene & the importance of weight reduction...  HYPERTENSION, MILD (ICD-401.1) - BP's up to 150/90 in the past and improved on diet Rx... ~  8/12:  BP= 120/82 not on med Rx and encouraged to elim sodium & lose weight> denies HA, visual changes, palipit, dizziness, syncope, dyspnea, edema, etc... ~  4/13:  BP= 140/98 & she is reminded to elim sodium, & get wt down; otherw she will need to consider antihypertensive medication... ~  10/13:  BP= 152/100 but she thinks it's stress related; told her to elim sodium,  get on diet, get wt down, or we'll start meds next visit... ~  4/14: not currently on meds; BP=152/98 today & advised to start Rx w/ LISINOPRIL/Hct 20-12.5 Qam, + diet, wt reduction, no salt, etc...   CHEST PAIN, ATYPICAL (ICD-786.59) - hx mult somatic complaints and CWP in the past...  ~  baseline EKG w/ NSR (rare PVC), WNL.[name].  ~  baseline CXR w/ clear lungs, NAD...  HYPERCHOLESTEROLEMIA (ICD-272.0) -  we have perscribed Simvastatin 40mg /d twice in the past (8/07 & 8/08) & Crestor10 (6/10) but she never followed through & stopped the meds... we have discussed low chol/ low fat diet & weight reduction strategies...  ~  FLP 8/07 on diet showed TChol 245, TG 113, HDL 47, LDL 169... rec to start Simva40- never did. ~  FLP 8/08 on diet showed TChol 279, TG 106, HDL 60, LDL 194... rec to start Simva40- pt stopped. ~  FLP 6/10 on diet showed TChol 210, TG 106, HDL 64, LDL 130... rec> Cres10- pt stopped on her own. ~  FLP 8/11 on diet alone showed TChol 229, TG 87, HDL 61, LDL 145... refuses meds, must lose weight!!! ~  FLP 8/12 on diet alone showed TChol 239, TG 82, HDL 66, LDL 169... FLP worse, rec CRESTOR10mg /d. ~  4/13:  She stopped the Crestor10mg  on her own several months ago but says she wants to refill & stick w/ it this time... ~  FLP 10/13 on Cres10 showed TChol 242, TG 99, HDL 60, LDL 161... Says she's taking it daily, therefore incr CRESTOR20. ~  4/14: on Cres20 but she ran out 60mo ago & didn't refill; advised to refill the Crestor 20mg /d + better diet & wt reduction, etc...  OBESITY (ICD-278.00) - we have discussed diet/ exercise/ wt reduction strategies... ~  initial weight 7/89 = 229#... max weight 8/06 = 274#.[name]. ~  weight 8/07 = 262#,  66" tall,  BMI= 42 ~  weight 8/08 = 260# ~  weight 5/10 = 239# ~  weight 8/11 = 230# ~  Weight 8/12 = 242# ~  Weight 4/13 = 234# ~  Weight 10/13 = 235# ~  Weight 4/14 = 240#  GERD (ICD-530.81) - hx reflux symptoms but never had GI eval or EGD...  takes OTC Prilosec vs Ranitadine as needed.  BACK PAIN >> c/o vague back pain & prev eval 11/12 by DrHilts w/ radiation into her right leg; she was given Pred, musc relaxer, & Percocet; reports some improvement but symptoms persist & she has diffuse discomfort as well; she is encouraged to f/u w/ DrHilts...  Hx of SOMATIC DYSFUNCTION (ICD-739.9) - hx mult somatic complaints in the past... ~  6/10: c/o headaches, insomnia, mult somatic complaints- Rx w/ Vicodin Prn use + AMBIEN 10mg  Prn for sleep. ~  4/14: her mult somatic complaints revolve around orthopedic issues- trigger finger, back pain, arthritis...  SHINGLES (ICD-053.9) & POSTHERPETIC NEURALGIA (ICD-053.19) - right T5-6 shingles 5/10 w/ post herpetic neuralgia finally improved & uses Vicodin- allowed up to 3/d Prn...  VITAMIN D DEFICIENCY >> Vit D level 8/12 = 14 and she is rec to start VIT D 50000 u weekly supplement... ~  4/13:  She stopped the VitD on her own several months ago & is requesting to restart now... ~  10/13:  Labs showed Vit D level = 24... rec to continue the VitD 50K weekly...  Health Maintenance >>  ~  GI:  She is 51y/o now & DrMarshall checks for occult blood=> refer to GI for screening colonoscopy but she wants to wait awhile... ~  GYN:  Everardo Beals is her gynecologist & eval 12/11 included a pelvic ultrasound w/ uterine enlargement due to anterior fibroid;  She usually gets yearly Mammogram at [facility]... ~  Immuniz:  She refuses the Flu vaccine...   History reviewed. No pertinent past surgical history.   Outpatient Encounter Prescriptions as of 08/02/2012  Medication Sig Dispense Refill  . amoxicillin-clavulanate (AUGMENTIN)  875-125 MG per tablet Take 1 tablet by mouth 2 (two) times daily.  14 tablet  1  . HYDROcodone-acetaminophen (NORCO) 5-325 MG per tablet Take 1 tablet by mouth every 8 (eight) hours as needed. DO NOT EXCEED 3 PILLS PER DAY  90 tablet  1  . hydrOXYzine (ATARAX/VISTARIL) 10 MG tablet Take  1 tablet (10 mg total) by mouth 3 (three) times daily as needed.  30 tablet  5  . mometasone (NASONEX) 50 MCG/ACT nasal spray Place 2 sprays into the nose daily.  17 g  5  . rosuvastatin (CRESTOR) 20 MG tablet Take 1 tablet (20 mg total) by mouth daily.  30 tablet  11  . Vitamin D, Ergocalciferol, (DRISDOL) 50000 UNITS CAPS Take 1 capsule (50,000 Units total) by mouth every 7 (seven) days.  4 capsule  5  . zolpidem (AMBIEN) 10 MG tablet Take 1 tablet (10 mg total) by mouth at bedtime as needed for sleep.  30 tablet  5   No facility-administered encounter medications on file as of 08/02/2012.    No Known Allergies   Current Medications, Allergies, Past Medical History, Past Surgical History, Family History, and Social History were reviewed in [employer] record.    Review of Systems        The patient complains of fatigue, dyspnea on exertion, frequent headaches, and hay fever.  The patient denies fever, chills, sweats, anorexia, weakness, malaise, weight loss, sleep disorder, blurring, diplopia, eye irritation, eye discharge, vision loss, eye pain, photophobia, earache, ear discharge, tinnitus, decreased hearing, nasal congestion, nosebleeds, sore throat, hoarseness, chest pain, palpitations, syncope, orthopnea, PND, peripheral edema, cough, dyspnea at rest, excessive sputum, hemoptysis, wheezing, pleurisy, nausea, vomiting, diarrhea, constipation, change in bowel habits, abdominal pain, melena, hematochezia, jaundice, gas/bloating, indigestion/heartburn, dysphagia, odynophagia, dysuria, hematuria, urinary frequency, urinary hesitancy, nocturia, incontinence, back pain, joint pain, joint swelling, muscle cramps, muscle weakness, stiffness, arthritis, sciatica, restless legs, leg pain at night, leg pain with exertion, rash, itching, dryness, suspicious lesions, paralysis, paresthesias, seizures, tremors, vertigo, transient blindness, frequent falls, difficulty walking,  depression, anxiety, memory loss, confusion, cold intolerance, heat intolerance, polydipsia, polyphagia, polyuria, unusual weight change, abnormal bruising, bleeding, enlarged lymph nodes, urticaria, allergic rash, and recurrent infections.     Objective:   Physical Exam     WD, Obese, 51 y/o BF in NAD... GENERAL:  Alert & oriented; pleasant & cooperative... HEENT:  [place]/AT, EOM-wnl, PERRLA, EACs-clear, TMs-wnl, NOSE-clear, THROAT-clear & wnl. NECK:  Supple w/ fairROM; no JVD; normal carotid impulses w/o bruits; no thyromegaly or nodules palpated; no lymphadenopathy. CHEST:  Clear to P & A; without wheezes/ rales/ or rhonchi. HEART:  Regular Rhythm; without murmurs/ rubs/ or gallops. ABDOMEN:  Obese, soft & nontender; normal bowel sounds; no organomegaly or masses detected. EXT: without deformities, mild arthritic changes; no varicose veins/ +venous insuffic/ no edema. NEURO:  CN's intact; motor testing normal; sensory testing normal; gait normal & balance OK. DERM:  mild scarring in the right T5-6 distrib, no active lesions...  RADIOLOGY DATA:  Reviewed in the EPIC EMR & discussed w/ the patient...  LABORATORY DATA:  Reviewed in the EPIC EMR & discussed w/ the patient...   Assessment & Plan:    AR/ SINUSITIS>  4/14 treatment w/ Levaquin, Align, Mucinex, Saline, Nasonex...  HBP>  BP remains sl elev & she prev declined med rx> now agrees to Lisinopril/ HCT 20-12.5 daily...  Hx CP & mult somatic complaints>  she is encouraged to start exercise program...  CHOL>  She agreed to try CRESTOR now up to 20mg  but ran out 47mo ago; rec to restart 7 f/u FLP on this dose at return...  OBESITY>  Weight reduction is key & we reviewed calorie restricted diet + exercise program...  GERD>  Hx mild GERD symptoms controlled w/ OTC PPI vs H2Blockers...  LBP>  She is somewhat vague & seems to get about OK; we refilled her Vicodin, told her NOT to get narcotic meds from anyone else & rec f/u DrHilts for  further eval...   Hx Shingles & Post-herpetic neuralgia>  Offered Neurology appt but she doesn't want this now, discomfort is intermittent, using OTC analgesics prn...  VIT D Deficiency>  New prob 8/12 w/ Vit D level = 14; placed on 50K weekly & f/u lab = 24, continue 50K per week.   Patient's Medications  New Prescriptions   LEVOFLOXACIN (LEVAQUIN) 500 MG TABLET    Take 1 tablet (500 mg total) by mouth daily.   LISINOPRIL-HYDROCHLOROTHIAZIDE (PRINZIDE,ZESTORETIC) 20-12.5 MG PER TABLET    Take 1 tablet by mouth daily.  Previous Medications   No medications on file  Modified Medications   Modified Medication Previous Medication   HYDROCODONE-ACETAMINOPHEN (NORCO) 5-325 MG PER TABLET HYDROcodone-acetaminophen (NORCO) 5-325 MG per tablet      Take 1 tablet by mouth every 8 (eight) hours as needed for pain. DO NOT EXCEED 3 PILLS PER DAY    Take 1 tablet by mouth every 8 (eight) hours as needed. DO NOT EXCEED 3 PILLS PER DAY   HYDROXYZINE (ATARAX/VISTARIL) 10 MG TABLET hydrOXYzine (ATARAX/VISTARIL) 10 MG tablet      Take 1 tablet (10 mg total) by mouth 3 (three) times daily as needed.    Take 1 tablet (10 mg total) by mouth 3 (three) times daily as needed.   MOMETASONE (NASONEX) 50 MCG/ACT NASAL SPRAY mometasone (NASONEX) 50 MCG/ACT nasal spray      Place 2 sprays into the nose daily.    Place 2 sprays into the nose daily.   ROSUVASTATIN (CRESTOR) 20 MG TABLET rosuvastatin (CRESTOR) 20 MG tablet      Take 1 tablet (20 mg total) by mouth daily.    Take 1 tablet (20 mg total) by mouth daily.   VITAMIN D, ERGOCALCIFEROL, (DRISDOL) 50000 UNITS CAPS Vitamin D, Ergocalciferol, (DRISDOL) 50000 UNITS CAPS      Take 1 capsule (50,000 Units total) by mouth every 7 (seven) days.    Take 1 capsule (50,000 Units total) by mouth every 7 (seven) days.   ZOLPIDEM (AMBIEN) 10 MG TABLET zolpidem (AMBIEN) 10 MG tablet      Take 1 tablet (10 mg total) by mouth at bedtime as needed for sleep.    Take 1 tablet (10 mg  total) by mouth at bedtime as needed for sleep.  Discontinued Medications   AMOXICILLIN-CLAVULANATE (AUGMENTIN) 875-125 MG PER TABLET    Take 1 tablet by mouth 2 (two) times daily.

## 2012-11-01 ENCOUNTER — Encounter: Payer: Self-pay | Admitting: Pulmonary Disease

## 2012-11-01 ENCOUNTER — Ambulatory Visit (INDEPENDENT_AMBULATORY_CARE_PROVIDER_SITE_OTHER): Payer: BC Managed Care – PPO | Admitting: Pulmonary Disease

## 2012-11-01 ENCOUNTER — Other Ambulatory Visit (INDEPENDENT_AMBULATORY_CARE_PROVIDER_SITE_OTHER): Payer: BC Managed Care – PPO

## 2012-11-01 VITALS — BP 140/84 | HR 85 | Temp 98.6°F | Ht 66.0 in | Wt 242.0 lb

## 2012-11-01 DIAGNOSIS — F419 Anxiety disorder, unspecified: Secondary | ICD-10-CM

## 2012-11-01 DIAGNOSIS — I10 Essential (primary) hypertension: Secondary | ICD-10-CM

## 2012-11-01 DIAGNOSIS — J309 Allergic rhinitis, unspecified: Secondary | ICD-10-CM

## 2012-11-01 DIAGNOSIS — E669 Obesity, unspecified: Secondary | ICD-10-CM

## 2012-11-01 DIAGNOSIS — M545 Low back pain, unspecified: Secondary | ICD-10-CM

## 2012-11-01 DIAGNOSIS — J019 Acute sinusitis, unspecified: Secondary | ICD-10-CM

## 2012-11-01 DIAGNOSIS — E7800 Pure hypercholesterolemia, unspecified: Secondary | ICD-10-CM

## 2012-11-01 DIAGNOSIS — F411 Generalized anxiety disorder: Secondary | ICD-10-CM

## 2012-11-01 DIAGNOSIS — E559 Vitamin D deficiency, unspecified: Secondary | ICD-10-CM

## 2012-11-01 DIAGNOSIS — K219 Gastro-esophageal reflux disease without esophagitis: Secondary | ICD-10-CM

## 2012-11-01 LAB — CBC WITH DIFFERENTIAL/PLATELET
Basophils Relative: 0.2 % (ref 0.0–3.0)
Eosinophils Relative: 1 % (ref 0.0–5.0)
HCT: 36.4 % (ref 36.0–46.0)
Lymphs Abs: 2 10*3/uL (ref 0.7–4.0)
MCV: 91.9 fl (ref 78.0–100.0)
Monocytes Absolute: 0.6 10*3/uL (ref 0.1–1.0)
Neutrophils Relative %: 56.3 % (ref 43.0–77.0)
RBC: 3.96 Mil/uL (ref 3.87–5.11)
WBC: 6.2 10*3/uL (ref 4.5–10.5)

## 2012-11-01 LAB — BASIC METABOLIC PANEL
BUN: 12 mg/dL (ref 6–23)
Calcium: 9.4 mg/dL (ref 8.4–10.5)
Creatinine, Ser: 0.8 mg/dL (ref 0.4–1.2)
GFR: 104.89 mL/min (ref >60.00)

## 2012-11-01 LAB — HEPATIC FUNCTION PANEL: Total Bilirubin: 0.9 mg/dL (ref 0.3–1.2)

## 2012-11-01 LAB — LIPID PANEL
Cholesterol: 149 mg/dL (ref 0–200)
LDL Cholesterol: 73 mg/dL (ref 0–99)
Triglycerides: 103 mg/dL (ref 0.0–149.0)
VLDL: 20.6 mg/dL (ref 0.0–40.0)

## 2012-11-01 MED ORDER — METHYLPREDNISOLONE ACETATE 80 MG/ML IJ SUSP
80.0000 mg | Freq: Once | INTRAMUSCULAR | Status: AC
  Administered 2012-11-01: 80 mg via INTRAMUSCULAR

## 2012-11-01 MED ORDER — LEVOFLOXACIN 500 MG PO TABS: 500.0000 mg | ORAL_TABLET | Freq: Every day | ORAL | Status: DC

## 2012-11-01 MED ORDER — METHYLPREDNISOLONE (PAK) 4 MG PO TABS: ORAL_TABLET | ORAL | Status: DC

## 2012-11-01 MED ORDER — FLUTICASONE PROPIONATE 50 MCG/ACT NA SUSP: NASAL | Status: DC

## 2012-11-01 NOTE — Progress Notes (Signed)
Subjective:    Patient ID: Jessica Lynn, female    DOB: [birth year], 52 y.o.   MRN: [id number]  HPI 52 y/o BF here for a follow up visit>>   ~  December 02, 2010:  Yearly ROV & Jessica Lynn has remained stable> the only entry into the EMR during the past yr was refill request for her Ambien;  BP controlled on diet alone but weight is up 12# to 242# today;  She notes occas intermittent CP, dizziness, DOE, etc; but she denies palpit, SOB, edema;  She has refused meds for her Lipids & was asked to improve her low chol/ low fat diet & get weight down...    Her CC is intermittent pain in right leg which she thinks is coming from the Shingles she had in 2010 (T5-6 on right); I offered med rx w/ Neurontin vs Neuro eval from DrWillis et al & she will think about it & let me know, otherw continue current pain mangement...    She did see DrMarshall, GYN for check up & Pelvic sonar showed enlarged uterus w/ an ant uterine fibroid; she is holding off on surg...  ~  August 05, 2011:  20mo ROV & Jessica Lynn's CC is pain- back pain, "pinched nerve in my back", and pain all over including ?neck & arms she says; we have her on Vicodin up to 3/d- prev written for post herpetic neuralgia;  When last seen 8/12 she was referred to Neuro, DrWillis but she couldn't get in for several weeks so she went to her Orthopedist, Building services engineer; she sates she couldn't walk & was told it was her siatic nerve w/ "pinched nerve in back" but didn't have MRI etc; she was given Pred, musc reloaxer, Percocet (which she says she threw away);  With all her persistent symptoms she is advised to f/u w/ DrHilts for further eval & prob MRI etc;  For now she requests to continue the Vicodin- I have allowed her to have up to 3/d, #90 per month, no early refills...    In the meanwhile she has run out of her other meds including Crestor10, VitD 50K, Ambien10, Atarax10, Nasonex> we have refilled all these meds today + he Vicodin;  See prob list below>>  ~  February 02, 2012:  27mo ROV & Jessica Lynn says that her eye doctor said she has "a film of allergies on my eyeballs", "now I can taste & smell it"- c/o frontal HA, sinus congestion, drainage, sl cough, sweats; she denies sputum, f/c, SOB, CP, etc... We decided to treat w/ Antihist Qam, Nasal saline mist, Nasonex Qhs; plus Augmentin 875mg Bid, Mucinex-2Bid w/ fluids, & refer to the [facility]...     HBP> not currently on meds; BP=152/100 but she says it's stress; advised diet, wt reduction, no salt, & start meds if not improved in follow up.    Chol> on Cres10 & says she's taking it daily; FLP showed TChol 242, TG 99, HDL 60, LDL 161; Rec to incr to Crestor 20mg /d + better diet & wt reduction.    Obesity> wt=235# (no change); we reviewed diet, exercise & wt reduction strategies...    LBP> on Vicodin as needed; she says taking 2-3 per day; she has mult somatic complaints...    Vit D Defic> supposed to be taking Vit D 50K weekly; Vit D level = 24 (improved from 14), continue same med... We reviewed prob list, meds, xrays and labs> see below for updates >> she declines the 2013 Flu  vaccine... LABS 11/13:  FLP- not at goals on Cres10;  Chems- wnl;  CBC- ok w/ Hg=12.5;  TSH=1.15;  VitD=24  ~  August 02, 2012:  97mo ROV & Jessica Lynn has several problems today>  1) Hx mult somatic complaints and today mentioned numerous Orthopedic issues- trigger finger w/ eval DrGramig, LBP as before, "leg messed up" etc; she has Vicodin for pain & ?seeing several of the [facility] doctors for her problems, "I need an MRI" she says.Marland KitchenMarland Kitchen  2) Sinusitis w/ c/o congestion, drainage, green mucous w/ foul odor & occas blood streaks; denies f/c/s, HA, etc; we discussed Rx w/ Levaquin, Align, Mucinex, Saline, Nasonex.Marland KitchenMarland Kitchen  3) Hypertension- her BP was elev at GYN & reads again ~150/100 today; she has not been restricting sodium, not on diet & weight is up 5# to 240# (BMI=38); we discussed low sodium weight reducing diet & exercise; Rec to start  LISINOPRIL/ Hct 20-12.5 daily...     HBP> not currently on meds; BP=152/98 today & advised to start Rx w/ LISINOPRIL/Hct 20-12.5 Qam, + diet, wt reduction, no salt, etc...    Chol> on Cres20 but she ran out 27mo ago & didn't refill; advised to refill the Crestor 20mg /d + better diet & wt reduction, etc...    Obesity> wt=240# today which is up 5# and BMI=38; we reviewed diet, exercise & wt reduction strategies...    LBP> on Vicodin as needed; she says taking 2-3 per day; she has mult somatic complaints...    Vit D Defic> supposed to be taking Vit D 50K weekly; 11/13 Vit D level = 24 (improved from 14), continue same med... We reviewed prob list, meds, xrays and labs> see below for updates >>   ~  November 01, 2012:  5mo ROV & last visit her BP was elev at 150/98 & we started LisinHCT 20-12.5 + diet/ exercise/ wt reduction; Today her wt is up 2# and BP similar at 150/90 but her CC is recurrent sinusitis and allergy symptoms> we treated her last time w/ Levaquin, Mucinex, Nasonex (never filled due to $$) & Saline; symptoms resolved then recurrent- notes "bad smell" drainage, taste, & "my eye are cutting up real bad" swollen she says... We discussed the need for allergy testing & we will refer to [facility]; in the interim Rx w/ Depo/ Dosepak, Levaquin, Mucinex, Flonase, Saline...     Chol> on Cres20; FLP 7/14 shows TChol 149, TG 103, HDL 56, LDL 73... Continue same...    Obesity> wt is up 2# to 242 today; must diet, exercise, get wt down...    VitD defic> on VitD 50K weekly; Labs 7/14 showed Vit D level = 42... rec to continue 50K per wk... We reviewed prob list, meds, xrays and labs> see below for updates >>  LABS 7/14:  FLP- at goals on Cres20;  Chems- wnl;  CBC- wnl w/ Hg=12.2;  TSH=0.43;  VitD=42...          PROBLEM LIST:     ALLERGIC RHINITIS (ICD-477.9) - she uses OTC antihistamines Prn, but c/o increased symptoms and we discussed adding NASONEX Qhs, Saline Prn, and ATARAX 10mg  Prn  itching... ~  10/13:  She notes that her eye doctor said she has "a film of allergies on my eyeballs", "now I can taste & smell it"- c/o frontal HA, sinus congestion, drainage, sl cough, sweats; she denies sputum, f/c, SOB, CP, etc... We decided to treat w/ Antihist Qam, Nasal saline mist, Nasonex Qhs; plus Augmentin 875mg Bid,  Mucinex-2Bid w/ fluids, & refer to the [facility]=> she never went... ~  7/14: c/o recurrent sinusitis, congestion, drainage, etc;  We discussed the need for allergy testing & we will refer to [facility]; in the interim Rx w/ Depo/ Dosepak, Levaquin, Mucinex, Flonase, Saline...   Hx of SNORING (ICD-786.09) - Hx snoring and ? of OSA- she was rec to take a sleep study but she declined... we discussed sleep hygiene & the importance of weight reduction...  HYPERTENSION, MILD (ICD-401.1) - BP's up to 150/90 in the past and improved on diet Rx... ~  8/12:  BP= 120/82 not on med Rx and encouraged to elim sodium & lose weight> denies HA, visual changes, palipit, dizziness, syncope, dyspnea, edema, etc... ~  4/13:  BP= 140/98 & she is reminded to elim sodium, & get wt down; otherw she will need to consider antihypertensive medication... ~  10/13:  BP= 152/100 but she thinks it's stress related; told her to elim sodium, get on diet, get wt down, or we'll start meds next visit... ~  4/14: not currently on meds; BP=152/98 today & advised to start Rx w/ LISINOPRIL/Hct 20-12.5 Qam, + diet, wt reduction, no salt, etc... ~  7/14: on LisinHCT20-12.5 and BP= 150/90 but c/o sinus congestion & pain today; continue Rx + no salt, get wt down to avoid further meds...   CHEST PAIN, ATYPICAL (ICD-786.59) - hx mult somatic complaints and CWP in the past...  ~  baseline EKG w/ NSR (rare PVC), WNL.[name].  ~  baseline CXR w/ clear lungs, NAD...  HYPERCHOLESTEROLEMIA (ICD-272.0) - we have perscribed Simvastatin 40mg /d twice in the past (8/07 & 8/08) & Crestor10 (6/10) but she never  followed through & stopped the meds... we have discussed low chol/ low fat diet & weight reduction strategies...  ~  FLP 8/07 on diet showed TChol 245, TG 113, HDL 47, LDL 169... rec to start Simva40- never did. ~  FLP 8/08 on diet showed TChol 279, TG 106, HDL 60, LDL 194... rec to start Simva40- pt stopped. ~  FLP 6/10 on diet showed TChol 210, TG 106, HDL 64, LDL 130... rec> Cres10- pt stopped on her own. ~  FLP 8/11 on diet alone showed TChol 229, TG 87, HDL 61, LDL 145... refuses meds, must lose weight!!! ~  FLP 8/12 on diet alone showed TChol 239, TG 82, HDL 66, LDL 169... FLP worse, rec CRESTOR10mg /d. ~  4/13:  She stopped the Crestor10mg  on her own several months ago but says she wants to refill & stick w/ it this time... ~  FLP 10/13 on Cres10 showed TChol 242, TG 99, HDL 60, LDL 161... Says she's taking it daily, therefore incr CRESTOR20. ~  4/14: on Cres20 but she ran out 71mo ago & didn't refill; advised to refill the Crestor 20mg /d + better diet & wt reduction, etc... ~  FLP 7/14 on Cres20 shows TChol 149, TG 103, HDL 56, LDL 73  OBESITY (ICD-278.00) - we have discussed diet/ exercise/ wt reduction strategies... ~  initial weight 7/89 = 229#... max weight 8/06 = 274#.[name]. ~  weight 8/07 = 262#,  66" tall,  BMI= 42 ~  weight 8/08 = 260# ~  weight 5/10 = 239# ~  weight 8/11 = 230# ~  Weight 8/12 = 242# ~  Weight 4/13 = 234# ~  Weight 10/13 = 235# ~  Weight 4/14 = 240# ~  Weight 7/14 = 242#  GERD (ICD-530.81) - hx reflux symptoms  but never had GI eval or EGD... takes OTC Prilosec vs Ranitadine as needed.  BACK PAIN >> c/o vague back pain & prev eval 11/12 by DrHilts w/ radiation into her right leg; she was given Pred, musc relaxer, & Percocet; reports some improvement but symptoms persist & she has diffuse discomfort as well; she is encouraged to f/u w/ DrHilts...  Hx of SOMATIC DYSFUNCTION (ICD-739.9) - hx mult somatic complaints in the past... ~  6/10: c/o headaches, insomnia,  mult somatic complaints- Rx w/ Vicodin Prn use + AMBIEN 10mg  Prn for sleep. ~  4/14: her mult somatic complaints revolve around orthopedic issues- trigger finger, back pain, arthritis...  SHINGLES (ICD-053.9) & POSTHERPETIC NEURALGIA (ICD-053.19) - right T5-6 shingles 5/10 w/ post herpetic neuralgia finally improved & uses Vicodin- allowed up to 3/d Prn...  VITAMIN D DEFICIENCY >> Vit D level 8/12 = 14 and she is rec to start VIT D 50000 u weekly supplement... ~  4/13:  She stopped the VitD on her own several months ago & is requesting to restart now... ~  10/13:  Labs showed Vit D level = 24... rec to continue the VitD 50K weekly... ~  7/14:  Labs showed Vit D level = 42 on Vit D 50K weekly, continue same.[name]  Health Maintenance >>  ~  GI:  She is 51y/o now & DrMarshall checks for occult blood=> refer to GI for screening colonoscopy but she wants to wait awhile... ~  GYN:  Everardo Beals is her gynecologist & eval 12/11 included a pelvic ultrasound w/ uterine enlargement due to anterior fibroid;  She usually gets yearly Mammogram at [facility]... ~  Immuniz:  She refuses the Flu vaccine...   History reviewed. No pertinent past surgical history.   Outpatient Encounter Prescriptions as of 11/01/2012  Medication Sig Dispense Refill  . HYDROcodone-acetaminophen (NORCO) 5-325 MG per tablet Take 1 tablet by mouth every 8 (eight) hours as needed for pain. DO NOT EXCEED 3 PILLS PER DAY  90 tablet  5  . hydrOXYzine (ATARAX/VISTARIL) 10 MG tablet Take 1 tablet (10 mg total) by mouth 3 (three) times daily as needed.  30 tablet  5  . lisinopril-hydrochlorothiazide (PRINZIDE,ZESTORETIC) 20-12.5 MG per tablet Take 1 tablet by mouth daily.  30 tablet  11  . rosuvastatin (CRESTOR) 20 MG tablet Take 1 tablet (20 mg total) by mouth daily.  30 tablet  11  . Vitamin D, Ergocalciferol, (DRISDOL) 50000 UNITS CAPS Take 1 capsule (50,000 Units total) by mouth every 7 (seven) days.  4 capsule  11  . zolpidem (AMBIEN)  10 MG tablet Take 1 tablet (10 mg total) by mouth at bedtime as needed for sleep.  30 tablet  5  . mometasone (NASONEX) 50 MCG/ACT nasal spray Place 2 sprays into the nose daily.  17 g  11  . [DISCONTINUED] levofloxacin (LEVAQUIN) 500 MG tablet Take 1 tablet (500 mg total) by mouth daily.  10 tablet  0   No facility-administered encounter medications on file as of 11/01/2012.    No Known Allergies   Current Medications, Allergies, Past Medical History, Past Surgical History, Family History, and Social History were reviewed in [employer] record.    Review of Systems        The patient complains of fatigue, dyspnea on exertion, frequent headaches, and hay fever.  The patient denies fever, chills, sweats, anorexia, weakness, malaise, weight loss, sleep disorder, blurring, diplopia, eye irritation, eye discharge, vision loss, eye pain, photophobia, earache, ear discharge,  tinnitus, decreased hearing, nasal congestion, nosebleeds, sore throat, hoarseness, chest pain, palpitations, syncope, orthopnea, PND, peripheral edema, cough, dyspnea at rest, excessive sputum, hemoptysis, wheezing, pleurisy, nausea, vomiting, diarrhea, constipation, change in bowel habits, abdominal pain, melena, hematochezia, jaundice, gas/bloating, indigestion/heartburn, dysphagia, odynophagia, dysuria, hematuria, urinary frequency, urinary hesitancy, nocturia, incontinence, back pain, joint pain, joint swelling, muscle cramps, muscle weakness, stiffness, arthritis, sciatica, restless legs, leg pain at night, leg pain with exertion, rash, itching, dryness, suspicious lesions, paralysis, paresthesias, seizures, tremors, vertigo, transient blindness, frequent falls, difficulty walking, depression, anxiety, memory loss, confusion, cold intolerance, heat intolerance, polydipsia, polyphagia, polyuria, unusual weight change, abnormal bruising, bleeding, enlarged lymph nodes, urticaria, allergic rash, and recurrent  infections.     Objective:   Physical Exam     WD, Obese, 51 y/o BF in NAD... GENERAL:  Alert & oriented; pleasant & cooperative... HEENT:  [place]/AT, EOM-wnl, PERRLA, EACs-clear, TMs-wnl, NOSE-clear, THROAT-clear & wnl. NECK:  Supple w/ fairROM; no JVD; normal carotid impulses w/o bruits; no thyromegaly or nodules palpated; no lymphadenopathy. CHEST:  Clear to P & A; without wheezes/ rales/ or rhonchi. HEART:  Regular Rhythm; without murmurs/ rubs/ or gallops. ABDOMEN:  Obese, soft & nontender; normal bowel sounds; no organomegaly or masses detected. EXT: without deformities, mild arthritic changes; no varicose veins/ +venous insuffic/ no edema. NEURO:  CN's intact; motor testing normal; sensory testing normal; gait normal & balance OK. DERM:  mild scarring in the right T5-6 distrib, no active lesions...  RADIOLOGY DATA:  Reviewed in the EPIC EMR & discussed w/ the patient...  LABORATORY DATA:  Reviewed in the EPIC EMR & discussed w/ the patient...   Assessment & Plan:    AR/ SINUSITIS>  4/14 treatment w/ Levaquin, Align, Mucinex, Saline, Nasonex helped but recurrent; we will re-treat & refer to [facility]...  HBP>  BP remains sl elev on Lisinopril/ HCT 20-12.5 daily; must get on diet, no salt, get wt down to avoid more meds...  Hx CP & mult somatic complaints>  she is encouraged to start exercise program...  CHOL>  FLP looks good on Cres20- continue same...  OBESITY>  Weight reduction is key & we reviewed calorie restricted diet + exercise program...  GERD>  Hx mild GERD symptoms controlled w/ OTC PPI vs H2Blockers...  LBP>  She is somewhat vague & seems to get about OK; we refilled her Vicodin, told her NOT to get narcotic meds from anyone else & rec f/u DrHilts for further eval...   Hx Shingles & Post-herpetic neuralgia>  Offered Neurology appt but she doesn't want this now, discomfort is intermittent, using OTC analgesics prn...  VIT D Deficiency>  New  prob 8/12 w/ Vit D level = 14; placed on 50K weekly & f/u lab = 42 now, continue 50K per week.   Patient's Medications  New Prescriptions   FLUTICASONE (FLONASE) 50 MCG/ACT NASAL SPRAY    2 sprays in each nostril two times daily   LEVOFLOXACIN (LEVAQUIN) 500 MG TABLET    Take 1 tablet (500 mg total) by mouth daily.   METHYLPREDNISOLONE (MEDROL DOSPACK) 4 MG TABLET    follow package directions  Previous Medications   HYDROCODONE-ACETAMINOPHEN (NORCO) 5-325 MG PER TABLET    Take 1 tablet by mouth every 8 (eight) hours as needed for pain. DO NOT EXCEED 3 PILLS PER DAY   HYDROXYZINE (ATARAX/VISTARIL) 10 MG TABLET    Take 1 tablet (10 mg total) by mouth 3 (three) times daily as needed.   LISINOPRIL-HYDROCHLOROTHIAZIDE (PRINZIDE,ZESTORETIC)  20-12.5 MG PER TABLET    Take 1 tablet by mouth daily.   ROSUVASTATIN (CRESTOR) 20 MG TABLET    Take 1 tablet (20 mg total) by mouth daily.   VITAMIN D, ERGOCALCIFEROL, (DRISDOL) 50000 UNITS CAPS    Take 1 capsule (50,000 Units total) by mouth every 7 (seven) days.   ZOLPIDEM (AMBIEN) 10 MG TABLET    Take 1 tablet (10 mg total) by mouth at bedtime as needed for sleep.  Modified Medications   No medications on file  Discontinued Medications   LEVOFLOXACIN (LEVAQUIN) 500 MG TABLET    Take 1 tablet (500 mg total) by mouth daily.   MOMETASONE (NASONEX) 50 MCG/ACT NASAL SPRAY    Place 2 sprays into the nose daily.

## 2012-11-01 NOTE — Patient Instructions (Addendum)
Today we updated your med list in our EPIC system...    Continue your current medications the same...  For your recurrent sinusitis>>    We wrote for another round of LEVAQUIN - one tab daily til gone...    Remember to take ALIGN probiotic while you are on the Levaquin Rx...    Today we gave you a Depo shot & a Medrol dosepak for the inflammation...    We also wrote for generic FLONASE- 2 sprays in each nostril twice daily...    Remember to use the Mucinex 600mg - 2 tabs twice daily w/ fluids...       and the NASAL SALINE MIST to spray every 1-2h while awake...  We will sched you for an allergy eval at [place] [facility]...  Today we rechecked your Cholesterol on the Crestor therapy...   Let's get on track w/ our diet & exercise program as discussed...  Let's plan a follow up visit in 3-26mo, sooner if needed for problems.Marland KitchenMarland Kitchen

## 2012-11-02 ENCOUNTER — Telehealth: Payer: Self-pay | Admitting: Pulmonary Disease

## 2012-11-02 LAB — VITAMIN D 25 HYDROXY (VIT D DEFICIENCY, FRACTURES): Vit D, 25-Hydroxy: 42 ng/mL (ref 30–89)

## 2012-11-02 NOTE — Telephone Encounter (Signed)
Called and did PA for the pt to get the ambien 10 mg tabs #30.   Pt will be able to pick this rx up on 8-8 for #30  And they will be sending out  Approval letter to SN and the pt.  i called the pt and she is aware and i will try the pharmacy back tomorrow.

## 2012-11-02 NOTE — Progress Notes (Signed)
Quick Note:  Spoke with patient, made her aware of results and recs as listed listed per Dr. Kriste Basque--  Patient verbalized understanding and nothing further needed at this time ______

## 2012-11-02 NOTE — Telephone Encounter (Signed)
Result Note    Please notify patient>    FLP looks great on Cres20- all parameters at goals- continue same...   Chems, LFTs, CBC, Thyroid, VitD> ALL WNL.[name]. rec to continue the VitD 50K weekly for now...   Spoke with patient, made her aware of results and recs as listed above per Dr. Kriste Basque--  Patient verbalized understanding and nothing further needed at this time

## 2012-11-03 NOTE — Telephone Encounter (Signed)
Called and spoke with Jessica Lynn and she is aware that the Remus Loffler will be covered per her insurance starting on 8-8 for a #30 for 30 day supply.  Nothing further is needed.

## 2012-11-03 NOTE — Telephone Encounter (Signed)
[facility] - was on hold > 10 minutes.  WCB

## 2013-02-01 ENCOUNTER — Ambulatory Visit (INDEPENDENT_AMBULATORY_CARE_PROVIDER_SITE_OTHER): Payer: BC Managed Care – PPO | Admitting: Pulmonary Disease

## 2013-02-01 ENCOUNTER — Encounter: Payer: Self-pay | Admitting: Pulmonary Disease

## 2013-02-01 VITALS — BP 142/94 | HR 70 | Temp 98.5°F | Ht 66.0 in | Wt 242.4 lb

## 2013-02-01 DIAGNOSIS — E559 Vitamin D deficiency, unspecified: Secondary | ICD-10-CM

## 2013-02-01 DIAGNOSIS — I10 Essential (primary) hypertension: Secondary | ICD-10-CM

## 2013-02-01 DIAGNOSIS — F419 Anxiety disorder, unspecified: Secondary | ICD-10-CM | POA: Insufficient documentation

## 2013-02-01 DIAGNOSIS — E669 Obesity, unspecified: Secondary | ICD-10-CM

## 2013-02-01 DIAGNOSIS — K219 Gastro-esophageal reflux disease without esophagitis: Secondary | ICD-10-CM

## 2013-02-01 DIAGNOSIS — M545 Low back pain, unspecified: Secondary | ICD-10-CM

## 2013-02-01 DIAGNOSIS — E7800 Pure hypercholesterolemia, unspecified: Secondary | ICD-10-CM

## 2013-02-01 DIAGNOSIS — F411 Generalized anxiety disorder: Secondary | ICD-10-CM

## 2013-02-01 MED ORDER — ZOLPIDEM TARTRATE 10 MG PO TABS: 10.0000 mg | ORAL_TABLET | Freq: Every evening | ORAL | Status: DC | PRN

## 2013-02-01 MED ORDER — HYDROXYZINE HCL 10 MG PO TABS: 10.0000 mg | ORAL_TABLET | Freq: Three times a day (TID) | ORAL | Status: DC | PRN

## 2013-02-01 MED ORDER — HYDROCODONE-ACETAMINOPHEN 5-325 MG PO TABS: 1.0000 | ORAL_TABLET | Freq: Three times a day (TID) | ORAL | Status: DC | PRN

## 2013-02-01 MED ORDER — FLUTICASONE PROPIONATE 50 MCG/ACT NA SUSP: NASAL | Status: DC

## 2013-02-01 MED ORDER — CLONAZEPAM 1 MG PO TABS: ORAL_TABLET | ORAL | Status: DC

## 2013-02-01 NOTE — Progress Notes (Signed)
Subjective:    Patient ID: Jessica Lynn, female    DOB: 1961-03-04, 52 y.o.   MRN: [id number]  HPI 52 y/o BF here for a follow up visit>>   ~  August 05, 2011:  32mo ROV & Jessica Lynn's CC is pain- back pain, "pinched nerve in my back", and pain all over including ?neck & arms she says; we have her on Vicodin up to 3/d- prev written for post herpetic neuralgia;  When last seen 8/12 she was referred to Neuro, DrWillis but she couldn't get in for several weeks so she went to her Orthopedist, Building services engineer; she sates she couldn't walk & was told it was her siatic nerve w/ "pinched nerve in back" but didn't have MRI etc; she was given Pred, musc reloaxer, Percocet (which she says she threw away);  With all her persistent symptoms she is advised to f/u w/ DrHilts for further eval & prob MRI etc;  For now she requests to continue the Vicodin- I have allowed her to have up to 3/d, #90 per month, no early refills...    In the meanwhile she has run out of her other meds including Crestor10, VitD 50K, Ambien10, Atarax10, Nasonex> we have refilled all these meds today + her Vicodin;  See prob list below>>  ~  February 02, 2012:  67mo ROV & Jessica Lynn says that her eye doctor said she has "a film of allergies on my eyeballs", "now I can taste & smell it"- c/o frontal HA, sinus congestion, drainage, sl cough, sweats; she denies sputum, f/c, SOB, CP, etc... We decided to treat w/ Antihist Qam, Nasal saline mist, Nasonex Qhs; plus Augmentin 875mg Bid, Mucinex-2Bid w/ fluids, & refer to the [facility]...     HBP> not currently on meds; BP=152/100 but she says it's stress; advised diet, wt reduction, no salt, & start meds if not improved in follow up.    Chol> on Cres10 & says she's taking it daily; FLP showed TChol 242, TG 99, HDL 60, LDL 161; Rec to incr to Crestor 20mg /d + better diet & wt reduction.    Obesity> wt=235# (no change); we reviewed diet, exercise & wt reduction strategies...    LBP> on Vicodin as needed; she  says taking 2-3 per day; she has mult somatic complaints...    Vit D Defic> supposed to be taking Vit D 50K weekly; Vit D level = 24 (improved from 14), continue same med... We reviewed prob list, meds, xrays and labs> see below for updates >> she declines the 2013 Flu vaccine... LABS 11/13:  FLP- not at goals on Cres10;  Chems- wnl;  CBC- ok w/ Hg=12.5;  TSH=1.15;  VitD=24  ~  August 02, 2012:  67mo ROV & Jessica Lynn has several problems today>  1) Hx mult somatic complaints and today mentioned numerous Orthopedic issues- trigger finger w/ eval DrGramig, LBP as before, "leg messed up" etc; she has Vicodin for pain & ?seeing several of the [facility] doctors for her problems, "I need an MRI" she says.Marland KitchenMarland Kitchen  2) Sinusitis w/ c/o congestion, drainage, green mucous w/ foul odor & occas blood streaks; denies f/c/s, HA, etc; we discussed Rx w/ Levaquin, Align, Mucinex, Saline, Nasonex.Marland KitchenMarland Kitchen  3) Hypertension- her BP was elev at GYN & reads again ~150/100 today; she has not been restricting sodium, not on diet & weight is up 5# to 240# (BMI=38); we discussed low sodium weight reducing diet & exercise; Rec to start LISINOPRIL/ Hct 20-12.5 daily...     HBP>  not currently on meds; BP=152/98 today & advised to start Rx w/ LISINOPRIL/Hct 20-12.5 Qam, + diet, wt reduction, no salt, etc...    Chol> on Cres20 but she ran out 21mo ago & didn't refill; advised to refill the Crestor 20mg /d + better diet & wt reduction, etc...    Obesity> wt=240# today which is up 5# and BMI=38; we reviewed diet, exercise & wt reduction strategies...    LBP> on Vicodin as needed; she says taking 2-3 per day; she has mult somatic complaints...    Vit D Defic> supposed to be taking Vit D 50K weekly; 11/13 Vit D level = 24 (improved from 14), continue same med... We reviewed prob list, meds, xrays and labs> see below for updates >>   ~  November 01, 2012:  4mo ROV & last visit her BP was elev at 150/98 & we started LisinHCT 20-12.5 + diet/ exercise/ wt  reduction; Today her wt is up 2# and BP similar at 150/90 but her CC is recurrent sinusitis and allergy symptoms> we treated her last time w/ Levaquin, Mucinex, Nasonex (never filled due to $$) & Saline; symptoms resolved then recurrent- notes "bad smell" drainage, taste, & "my eye are cutting up real bad" swollen she says... We discussed the need for allergy testing & we will refer to [facility]; in the interim Rx w/ Depo/ Dosepak, Levaquin, Mucinex, Flonase, Saline...     Chol> on Cres20; FLP 7/14 shows TChol 149, TG 103, HDL 56, LDL 73... Continue same...    Obesity> wt is up 2# to 242 today; must diet, exercise, get wt down...    VitD defic> on VitD 50K weekly; Labs 7/14 showed Vit D level = 42... rec to continue 50K per wk... We reviewed prob list, meds, xrays and labs> see below for updates >>  LABS 7/14:  FLP- at goals on Cres20;  Chems- wnl;  CBC- wnl w/ Hg=12.2;  TSH=0.43;  VitD=42...  ~  February 01, 2013:  4mo ROV & recheck> after her last visit [name] went to see DrSharma for [facility] due to her chronic sinus complaints- she reports that her allergy testing was all neg & he told her "it was seasonal"; she was given Flonase, Patanase, Zyrtek, & Singulair; plus she was treated w/ Avelox, Depo120 & Prednisone; a CT Sinus was ordered but apparently never done; she states that she is somewhat better but still has some of the bad taste & smell she says; she is planning a follow up visit w/ DrSharma soon...  We reviewed the following medical problems during today's office visit >>     HBP> on LISINOPRIL/Hct 20-12.5 Qam but she ran out 5d ago; BP= 142/94 & she denies HA, visual symptoms, dizziness, CP, palpit, SOB, edema; reminded to take everyday, no salt, get wt down!    Chol> on Cres20; FLP 7/14 showed TChol 149, TG 103, HDL 56, LDL 73... Continue same plus diet & exercise...    Obesity> wt is stable at 242# today; must diet, exercise, get wt down...    LBP> on Vicodin  as needed; she says taking 2-3 per day; she has mult somatic complaints...    VitD defic> on VitD 50K weekly; Labs 7/14 showed Vit D level = 42... rec to continue 50K per wk...    Anxiety> notes that her Principal at school started a med & is much calmer; she wants rx & we discussed Klonopin 1mg - 1/2 to 1 tab Bid as  needed... We reviewed prob list, meds, xrays and labs> see below for updates >> she has declined the 2014 Flu vaccine... Requests refill prescriptions for all meds- OK...           PROBLEM LIST:     ALLERGIC RHINITIS (ICD-477.9) - she uses OTC antihistamines Prn, but c/o increased symptoms and we discussed adding NASONEX Qhs, Saline Prn, and ATARAX 10mg  Prn itching... ~  10/13:  She notes that her eye doctor said she has "a film of allergies on my eyeballs", "now I can taste & smell it"- c/o frontal HA, sinus congestion, drainage, sl cough, sweats; she denies sputum, f/c, SOB, CP, etc... We decided to treat w/ Antihist Qam, Nasal saline mist, Nasonex Qhs; plus Augmentin 875mg Bid, Mucinex-2Bid w/ fluids, & refer to the [facility]=> she never went... ~  7/14: c/o recurrent sinusitis, congestion, drainage, etc;  We discussed the need for allergy testing & we will refer to [facility]; in the interim Rx w/ Depo/ Dosepak, Levaquin, Mucinex, Flonase, Saline...  ~  9/14: she saw DrSharma for [facility]> she reports that her allergy testing was all neg & he told her "it was seasonal"; she was given Flonase, Patanase, Zyrtek, & Singulair; plus she was treated w/ Avelox, Depo120 & Prednisone; a CT Sinus was ordered but apparently never done; she states that she is somewhat better but still has some of the bad taste & smell she says; she is planning a follow up visit w/ DrSharma soon  Hx of SNORING (ICD-786.09) - Hx snoring and ? of OSA- she was rec to take a sleep study but she declined... we discussed sleep hygiene & the importance of weight  reduction...  HYPERTENSION, MILD (ICD-401.1) - BP's up to 150/90 in the past and improved on diet Rx... ~  8/12:  BP= 120/82 not on med Rx and encouraged to elim sodium & lose weight> denies HA, visual changes, palipit, dizziness, syncope, dyspnea, edema, etc... ~  4/13:  BP= 140/98 & she is reminded to elim sodium, & get wt down; otherw she will need to consider antihypertensive medication... ~  10/13:  BP= 152/100 but she thinks it's stress related; told her to elim sodium, get on diet, get wt down, or we'll start meds next visit... ~  4/14: not currently on meds; BP=152/98 today & advised to start Rx w/ LISINOPRIL/Hct 20-12.5 Qam, + diet, wt reduction, no salt, etc... ~  7/14: on LisinHCT20-12.5 and BP= 150/90 but c/o sinus congestion & pain today; continue Rx + no salt, get wt down to avoid further meds... ~  10/14: on LISINOPRIL/Hct 20-12.5 Qam but she ran out 5d ago; BP= 142/94 & she denies HA, visual symptoms, dizziness, CP, palpit, SOB, edema; reminded to take everyday, no salt, get wt down!   CHEST PAIN, ATYPICAL (ICD-786.59) - hx mult somatic complaints and CWP in the past...  ~  baseline EKG w/ NSR (rare PVC), WNL.[name].  ~  baseline CXR w/ clear lungs, NAD...  HYPERCHOLESTEROLEMIA (ICD-272.0) - we have perscribed Simvastatin 40mg /d twice in the past (8/07 & 8/08) & Crestor10 (6/10) but she never followed through & stopped the meds... we have discussed low chol/ low fat diet & weight reduction strategies...  ~  FLP 8/07 on diet showed TChol 245, TG 113, HDL 47, LDL 169... rec to start Simva40- never did. ~  FLP 8/08 on diet showed TChol 279, TG 106, HDL 60, LDL 194... rec to start Simva40- pt  stopped. ~  FLP 6/10 on diet showed TChol 210, TG 106, HDL 64, LDL 130... rec> Cres10- pt stopped on her own. ~  FLP 8/11 on diet alone showed TChol 229, TG 87, HDL 61, LDL 145... refuses meds, must lose weight!!! ~  FLP 8/12 on diet alone showed TChol 239, TG 82, HDL 66, LDL 169... FLP worse, rec  CRESTOR10mg /d. ~  4/13:  She stopped the Crestor10mg  on her own several months ago but says she wants to refill & stick w/ it this time... ~  FLP 10/13 on Cres10 showed TChol 242, TG 99, HDL 60, LDL 161... Says she's taking it daily, therefore incr CRESTOR20. ~  4/14: on Cres20 but she ran out 2mo ago & didn't refill; advised to refill the Crestor 20mg /d + better diet & wt reduction, etc... ~  FLP 7/14 on Cres20 shows TChol 149, TG 103, HDL 56, LDL 73  OBESITY (ICD-278.00) - we have discussed diet/ exercise/ wt reduction strategies... ~  initial weight 7/89 = 229#... max weight 8/06 = 274#.[name]. ~  weight 8/07 = 262#,  66" tall,  BMI= 42 ~  weight 8/08 = 260# ~  weight 5/10 = 239# ~  weight 8/11 = 230# ~  Weight 8/12 = 242# ~  Weight 4/13 = 234# ~  Weight 10/13 = 235# ~  Weight 4/14 = 240# ~  Weight 7/14 = 242# ~  Weight 10/14 = 242#  GERD (ICD-530.81) - hx reflux symptoms but never had GI eval or EGD... takes OTC Prilosec vs Ranitadine as needed. ~  10/14: she is in need of a screening colonoscopy but wants to wait til next summer when school is out; she will call us when she is ready for the referral to be set up...  BACK PAIN >> c/o vague back pain & prev eval 11/12 by DrHilts w/ radiation into her right leg; she was given Pred, musc relaxer, & Percocet; reports some improvement but symptoms persist & she has diffuse discomfort as well; she is encouraged to f/u w/ DrHilts...  Hx of SOMATIC DYSFUNCTION (ICD-739.9) - hx mult somatic complaints in the past... ~  6/10: c/o headaches, insomnia, mult somatic complaints- Rx w/ Vicodin Prn use + AMBIEN 10mg  Prn for sleep. ~  4/14: her mult somatic complaints revolve around orthopedic issues- trigger finger, back pain, arthritis...  SHINGLES (ICD-053.9) & POSTHERPETIC NEURALGIA (ICD-053.19) - right T5-6 shingles 5/10 w/ post herpetic neuralgia finally improved & uses Vicodin- allowed up to 3/d Prn...  VITAMIN D DEFICIENCY >> Vit D level 8/12 =  14 and she is rec to start VIT D 50000 u weekly supplement... ~  4/13:  She stopped the VitD on her own several months ago & is requesting to restart now... ~  10/13:  Labs showed Vit D level = 24... rec to continue the VitD 50K weekly... 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  Health Maintenance >>  ~  GI:  She is 51y/o now & DrMarshall checks for occult blood=> refer to GI for screening colonoscopy but she wants to wait awhile... ~  GYN:  Everardo Beals is her gynecologist & eval 12/11 included a pelvic ultrasound w/ uterine enlargement due to anterior fibroid;  She usually gets yearly Mammogram at [facility]... ~  Immuniz:  She  refuses the Flu vaccine...   History reviewed. No pertinent past surgical history.   Outpatient Encounter Prescriptions as of 02/01/2013  Medication Sig Dispense Refill  . fluticasone (FLONASE) 50 MCG/ACT nasal spray 2 sprays in each nostril two times daily  16 g  6  . HYDROcodone-acetaminophen (NORCO) 5-325 MG per tablet Take 1 tablet by mouth every 8 (eight) hours as needed for pain. DO NOT EXCEED 3 PILLS PER DAY  90 tablet  0  . hydrOXYzine (ATARAX/VISTARIL) 10 MG tablet Take 1 tablet (10 mg total) by mouth 3 (three) times daily as needed.  30 tablet  5  . lisinopril-hydrochlorothiazide (PRINZIDE,ZESTORETIC) 20-12.5 MG per tablet Take 1 tablet by mouth daily.  30 tablet  11  . rosuvastatin (CRESTOR) 20 MG tablet Take 1 tablet (20 mg total) by mouth daily.  30 tablet  11  . Vitamin D, Ergocalciferol, (DRISDOL) 50000 UNITS CAPS Take 1 capsule (50,000 Units total) by mouth every 7 (seven) days.  4 capsule  11  . zolpidem (AMBIEN) 10 MG tablet Take 1 tablet (10 mg total) by mouth at bedtime as needed for sleep.  30 tablet  5  . [DISCONTINUED] fluticasone (FLONASE) 50 MCG/ACT  nasal spray 2 sprays in each nostril two times daily  16 g  6  . [DISCONTINUED] HYDROcodone-acetaminophen (NORCO) 5-325 MG per tablet Take 1 tablet by mouth every 8 (eight) hours as needed for pain. DO NOT EXCEED 3 PILLS PER DAY  90 tablet  5  . [DISCONTINUED] hydrOXYzine (ATARAX/VISTARIL) 10 MG tablet Take 1 tablet (10 mg total) by mouth 3 (three) times daily as needed.  30 tablet  5  . [DISCONTINUED] zolpidem (AMBIEN) 10 MG tablet Take 1 tablet (10 mg total) by mouth at bedtime as needed for sleep.  30 tablet  5  . clonazePAM (KLONOPIN) 1 MG tablet Take 1/2 to 1  Tablet by mouth two times daily as needed for nerves  60 tablet  5  . [DISCONTINUED] levofloxacin (LEVAQUIN) 500 MG tablet Take 1 tablet (500 mg total) by mouth daily.  7 tablet  0  . [DISCONTINUED] methylPREDNIsolone (MEDROL DOSPACK) 4 MG tablet follow package directions  21 tablet  0   No facility-administered encounter medications on file as of 02/01/2013.    No Known Allergies   Current Medications, Allergies, Past Medical History, Past Surgical History, Family History, and Social History were reviewed in [employer] record.    Review of Systems        The patient complains of fatigue, dyspnea on exertion, frequent headaches, and hay fever.  The patient denies fever, chills, sweats, anorexia, weakness, malaise, weight loss, sleep disorder, blurring, diplopia, eye irritation, eye discharge, vision loss, eye pain, photophobia, earache, ear discharge, tinnitus, decreased hearing, nasal congestion, nosebleeds, sore throat, hoarseness, chest pain, palpitations, syncope, orthopnea, PND, peripheral edema, cough, dyspnea at rest, excessive sputum, hemoptysis, wheezing, pleurisy, nausea, vomiting, diarrhea, constipation, change in bowel habits, abdominal pain, melena, hematochezia, jaundice, gas/bloating, indigestion/heartburn, dysphagia, odynophagia, dysuria, hematuria, urinary frequency, urinary hesitancy, nocturia,  incontinence, back pain, joint pain, joint swelling, muscle cramps, muscle weakness, stiffness, arthritis, sciatica, restless legs, leg pain at night, leg pain with exertion, rash, itching, dryness, suspicious lesions, paralysis, paresthesias, seizures, tremors, vertigo, transient blindness, frequent falls, difficulty walking, depression, anxiety, memory loss, confusion, cold intolerance, heat intolerance, polydipsia, polyphagia, polyuria, unusual weight change, abnormal bruising, bleeding, enlarged lymph nodes, urticaria, allergic rash, and recurrent infections.     Objective:   Physical Exam  WD, Obese, 51 y/o BF in NAD... GENERAL:  Alert & oriented; pleasant & cooperative... HEENT:  [place]/AT, EOM-wnl, PERRLA, EACs-clear, TMs-wnl, NOSE-clear, THROAT-clear & wnl. NECK:  Supple w/ fairROM; no JVD; normal carotid impulses w/o bruits; no thyromegaly or nodules palpated; no lymphadenopathy. CHEST:  Clear to P & A; without wheezes/ rales/ or rhonchi. HEART:  Regular Rhythm; without murmurs/ rubs/ or gallops. ABDOMEN:  Obese, soft & nontender; normal bowel sounds; no organomegaly or masses detected. EXT: without deformities, mild arthritic changes; no varicose veins/ +venous insuffic/ no edema. NEURO:  CN's intact; motor testing normal; sensory testing normal; gait normal & balance OK. DERM:  mild scarring in the right T5-6 distrib, no active lesions...  RADIOLOGY DATA:  Reviewed in the EPIC EMR & discussed w/ the patient...  LABORATORY DATA:  Reviewed in the EPIC EMR & discussed w/ the patient...   Assessment & Plan:    AR/ SINUSITIS>  4/14 treatment w/ Levaquin, Align, Mucinex, Saline, Nasonex helped but recurrent; we re-treated & referred to [facility]=> see above for DrSharma's care (allergy testing reported neg)...  HBP>  BP remains sl elev on Lisinopril/ HCT 20-12.5 daily; must get on diet, no salt, get wt down to avoid more meds...  Hx CP & mult somatic complaints>   she is encouraged to start exercise program...  CHOL>  FLP looks good on Cres20- continue same...  OBESITY>  Weight reduction is key & we reviewed calorie restricted diet + exercise program...  GERD>  Hx mild GERD symptoms controlled w/ OTC PPI vs H2Blockers...  LBP>  She is somewhat vague & seems to get about OK; we refilled her Vicodin, told her NOT to get narcotic meds from anyone else & rec f/u DrHilts for further eval...   Hx Shingles & Post-herpetic neuralgia>  Offered Neurology appt but she doesn't want this now, discomfort is intermittent, using OTC analgesics prn...  VIT D Deficiency>  New prob 8/12 w/ Vit D level = 14; placed on 50K weekly & f/u lab = 42 now, continue 50K per week.  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   Patient's Medications  New Prescriptions   CLONAZEPAM (KLONOPIN) 1 MG TABLET    Take 1/2 to 1  Tablet by mouth two times daily as needed for nerves  Previous Medications   LISINOPRIL-HYDROCHLOROTHIAZIDE (PRINZIDE,ZESTORETIC) 20-12.5 MG PER TABLET    Take 1 tablet by mouth daily.   ROSUVASTATIN (CRESTOR) 20 MG TABLET    Take 1 tablet (20 mg total) by mouth daily.   VITAMIN D, ERGOCALCIFEROL, (DRISDOL) 50000 UNITS CAPS    Take 1 capsule (50,000 Units total) by mouth every 7 (seven) days.  Modified Medications   Modified Medication Previous Medication   FLUTICASONE (FLONASE) 50 MCG/ACT NASAL SPRAY fluticasone (FLONASE) 50 MCG/ACT nasal spray      2 sprays in each nostril two times daily    2 sprays in each nostril two times daily   HYDROCODONE-ACETAMINOPHEN (NORCO) 5-325 MG PER TABLET HYDROcodone-acetaminophen (NORCO) 5-325 MG per tablet      Take 1 tablet by mouth every 8 (eight) hours as needed for pain. DO NOT EXCEED 3 PILLS PER DAY    Take 1 tablet by mouth every 8 (eight) hours as needed for pain. DO NOT EXCEED 3 PILLS PER DAY   HYDROXYZINE  (ATARAX/VISTARIL) 10 MG TABLET hydrOXYzine (ATARAX/VISTARIL) 10 MG tablet  Take 1 tablet (10 mg total) by mouth 3 (three) times daily as needed.    Take 1 tablet (10 mg total) by mouth 3 (three) times daily as needed.   ZOLPIDEM (AMBIEN) 10 MG TABLET zolpidem (AMBIEN) 10 MG tablet      Take 1 tablet (10 mg total) by mouth at bedtime as needed for sleep.    Take 1 tablet (10 mg total) by mouth at bedtime as needed for sleep.  Discontinued Medications   LEVOFLOXACIN (LEVAQUIN) 500 MG TABLET    Take 1 tablet (500 mg total) by mouth daily.   METHYLPREDNISOLONE (MEDROL DOSPACK) 4 MG TABLET    follow package directions

## 2013-02-01 NOTE — Patient Instructions (Signed)
Today we updated your med list in our EPIC system...    Continue your current medications the same...  Be sure to take your BP med regularly and work on weight reduction...  We wrote a new medication- KLONOPIN 1mg - take 1/2 to 1 tab twice daily as needed for nerves...  Call for any questions...  Let's plan a follow up visit in 69mo, sooner if needed for problems.Marland KitchenMarland Kitchen

## 2013-03-02 ENCOUNTER — Telehealth: Payer: Self-pay | Admitting: Pulmonary Disease

## 2013-03-02 MED ORDER — HYDROCODONE-ACETAMINOPHEN 5-325 MG PO TABS: 1.0000 | ORAL_TABLET | Freq: Three times a day (TID) | ORAL | Status: DC | PRN

## 2013-03-02 NOTE — Telephone Encounter (Signed)
Per SN---  Ok to refill the norco.  rx has been printed out and placed on SN cart to be signed.

## 2013-03-02 NOTE — Telephone Encounter (Signed)
I called and spoke with pt. She is requesting RX for norco. Last refilled 02/01/13. Pt reports she is not out yet but is going out of town today x 2 weeks. She wants to pick up rx before she leaves town. Please advise SN thanks

## 2013-03-02 NOTE — Telephone Encounter (Signed)
i have called and spoke with pt and she is aware that her rx for the vicodin is up front and ready to be picked up.

## 2013-04-08 ENCOUNTER — Telehealth: Payer: Self-pay | Admitting: Pulmonary Disease

## 2013-04-08 MED ORDER — HYDROCODONE-ACETAMINOPHEN 5-325 MG PO TABS: 1.0000 | ORAL_TABLET | Freq: Three times a day (TID) | ORAL | Status: DC | PRN

## 2013-04-08 NOTE — Telephone Encounter (Signed)
Called and spoke with pt and she stated that she needs a refill of the hydrocodone.  rx has been printed out and pt is aware that i will call once this is ready to be picked up.

## 2013-04-08 NOTE — Telephone Encounter (Signed)
rx has been signed by SN and is ready to be picked up.  i have called and spoke with pt and she is aware.

## 2013-05-09 ENCOUNTER — Telehealth: Payer: Self-pay | Admitting: Pulmonary Disease

## 2013-05-09 DIAGNOSIS — M545 Low back pain, unspecified: Secondary | ICD-10-CM

## 2013-05-09 DIAGNOSIS — E7800 Pure hypercholesterolemia, unspecified: Secondary | ICD-10-CM

## 2013-05-09 DIAGNOSIS — F411 Generalized anxiety disorder: Secondary | ICD-10-CM

## 2013-05-09 DIAGNOSIS — I10 Essential (primary) hypertension: Secondary | ICD-10-CM

## 2013-05-09 DIAGNOSIS — J309 Allergic rhinitis, unspecified: Secondary | ICD-10-CM

## 2013-05-09 DIAGNOSIS — K219 Gastro-esophageal reflux disease without esophagitis: Secondary | ICD-10-CM

## 2013-05-09 DIAGNOSIS — E669 Obesity, unspecified: Secondary | ICD-10-CM

## 2013-05-09 DIAGNOSIS — E559 Vitamin D deficiency, unspecified: Secondary | ICD-10-CM

## 2013-05-09 DIAGNOSIS — B0229 Other postherpetic nervous system involvement: Secondary | ICD-10-CM

## 2013-05-09 MED ORDER — HYDROCODONE-ACETAMINOPHEN 5-325 MG PO TABS: 1.0000 | ORAL_TABLET | Freq: Three times a day (TID) | ORAL | Status: DC | PRN

## 2013-05-09 NOTE — Telephone Encounter (Signed)
Called and spoke with pt and she is aware of rx up front and ready to be picked up.

## 2013-05-09 NOTE — Telephone Encounter (Signed)
Per SN---  Ok to refill the norco.  Let the pt know that she will need to find a new primary care doctor.  thanks

## 2013-05-09 NOTE — Telephone Encounter (Signed)
Pt requesting refill on norco. Last refilled 04/08/13. Please advise SN thanks Last OV 01/2013 Pending 07/21/13

## 2013-05-09 NOTE — Telephone Encounter (Signed)
rx printed and placed on SN cart to sign. Please call pt when ready. Pt is aware of retirement and referral placed to PCP on [address] per pt requests.  Bing, CMA

## 2013-06-06 ENCOUNTER — Telehealth: Payer: Self-pay | Admitting: Pulmonary Disease

## 2013-06-06 DIAGNOSIS — M545 Low back pain, unspecified: Secondary | ICD-10-CM

## 2013-06-06 DIAGNOSIS — K219 Gastro-esophageal reflux disease without esophagitis: Secondary | ICD-10-CM

## 2013-06-06 DIAGNOSIS — B0229 Other postherpetic nervous system involvement: Secondary | ICD-10-CM

## 2013-06-06 DIAGNOSIS — F411 Generalized anxiety disorder: Secondary | ICD-10-CM

## 2013-06-06 DIAGNOSIS — E7800 Pure hypercholesterolemia, unspecified: Secondary | ICD-10-CM

## 2013-06-06 DIAGNOSIS — E559 Vitamin D deficiency, unspecified: Secondary | ICD-10-CM

## 2013-06-06 DIAGNOSIS — E669 Obesity, unspecified: Secondary | ICD-10-CM

## 2013-06-06 DIAGNOSIS — I10 Essential (primary) hypertension: Secondary | ICD-10-CM

## 2013-06-06 DIAGNOSIS — J309 Allergic rhinitis, unspecified: Secondary | ICD-10-CM

## 2013-06-06 MED ORDER — HYDROCODONE-ACETAMINOPHEN 5-325 MG PO TABS: 1.0000 | ORAL_TABLET | Freq: Three times a day (TID) | ORAL | Status: DC | PRN

## 2013-06-06 NOTE — Telephone Encounter (Signed)
lmomtcb x 1   Need to let pt know that rx is ready to be picked up.  Pt will also need to be told that SN will no longer be seeing primary care after April 1, so she will need to be set up with a new primary care doctor.  thanks

## 2013-06-06 NOTE — Telephone Encounter (Signed)
rx has been printed out and placed on SN cart to be signed.  Will call pt once this has been done.  

## 2013-06-07 NOTE — Telephone Encounter (Signed)
Pt is aware that her rx is ready for pick up. Order has been placed for primary care referral per the pt's request. Nothing further is needed.

## 2013-06-07 NOTE — Telephone Encounter (Signed)
Pt returned call. Please call back. Would like to speak to nurse

## 2013-06-14 ENCOUNTER — Telehealth: Payer: Self-pay | Admitting: *Deleted

## 2013-06-14 NOTE — Telephone Encounter (Signed)
Pt has appt with Dr. Alain Marion at the [facility] on 07/11/2013

## 2013-07-11 ENCOUNTER — Other Ambulatory Visit (HOSPITAL_COMMUNITY): Payer: Self-pay | Admitting: Obstetrics

## 2013-07-11 ENCOUNTER — Ambulatory Visit: Payer: BC Managed Care – PPO | Admitting: Internal Medicine

## 2013-07-11 ENCOUNTER — Telehealth: Payer: Self-pay | Admitting: Pulmonary Disease

## 2013-07-11 ENCOUNTER — Ambulatory Visit: Payer: BC Managed Care – PPO | Admitting: Pulmonary Disease

## 2013-07-11 DIAGNOSIS — J309 Allergic rhinitis, unspecified: Secondary | ICD-10-CM

## 2013-07-11 DIAGNOSIS — F411 Generalized anxiety disorder: Secondary | ICD-10-CM

## 2013-07-11 DIAGNOSIS — E669 Obesity, unspecified: Secondary | ICD-10-CM

## 2013-07-11 DIAGNOSIS — I10 Essential (primary) hypertension: Secondary | ICD-10-CM

## 2013-07-11 DIAGNOSIS — E7800 Pure hypercholesterolemia, unspecified: Secondary | ICD-10-CM

## 2013-07-11 DIAGNOSIS — M545 Low back pain, unspecified: Secondary | ICD-10-CM

## 2013-07-11 DIAGNOSIS — Z1231 Encounter for screening mammogram for malignant neoplasm of breast: Secondary | ICD-10-CM

## 2013-07-11 DIAGNOSIS — Z029 Encounter for administrative examinations, unspecified: Secondary | ICD-10-CM

## 2013-07-11 DIAGNOSIS — B0229 Other postherpetic nervous system involvement: Secondary | ICD-10-CM

## 2013-07-11 DIAGNOSIS — E559 Vitamin D deficiency, unspecified: Secondary | ICD-10-CM

## 2013-07-11 DIAGNOSIS — K219 Gastro-esophageal reflux disease without esophagitis: Secondary | ICD-10-CM

## 2013-07-11 MED ORDER — HYDROCODONE-ACETAMINOPHEN 5-325 MG PO TABS: 1.0000 | ORAL_TABLET | Freq: Three times a day (TID) | ORAL | Status: DC | PRN

## 2013-07-11 MED ORDER — CLONAZEPAM 1 MG PO TABS: ORAL_TABLET | ORAL | Status: DC

## 2013-07-11 MED ORDER — ZOLPIDEM TARTRATE 10 MG PO TABS: 10.0000 mg | ORAL_TABLET | Freq: Every evening | ORAL | Status: DC | PRN

## 2013-07-11 NOTE — Telephone Encounter (Signed)
Spoke with the pt and explained that SN is seeing pt's at this time and we will have to call her back  She verbalized understanding  She is set to see Plotnikov on 07/26/13 and is requesting refills on ambien, clonazepam, and vicodin refilled until then  Please advise thanks!

## 2013-07-11 NOTE — Telephone Encounter (Signed)
rx have been printed out and i will call her once these have been signed by SN.

## 2013-07-12 NOTE — Telephone Encounter (Signed)
Called and spoke with pt and she is aware of rx that are up front and ready to be picked up.

## 2013-07-26 ENCOUNTER — Other Ambulatory Visit (INDEPENDENT_AMBULATORY_CARE_PROVIDER_SITE_OTHER): Payer: BC Managed Care – PPO

## 2013-07-26 ENCOUNTER — Encounter: Payer: Self-pay | Admitting: Internal Medicine

## 2013-07-26 ENCOUNTER — Ambulatory Visit (HOSPITAL_COMMUNITY)
Admission: RE | Admit: 2013-07-26 | Discharge: 2013-07-26 | Disposition: A | Discharge status: Home / Self Care | Payer: BC Managed Care – PPO | Source: Ambulatory Visit | Attending: Obstetrics | Admitting: Obstetrics

## 2013-07-26 ENCOUNTER — Ambulatory Visit (HOSPITAL_COMMUNITY): Payer: BC Managed Care – PPO

## 2013-07-26 ENCOUNTER — Ambulatory Visit (INDEPENDENT_AMBULATORY_CARE_PROVIDER_SITE_OTHER): Payer: BC Managed Care – PPO | Admitting: Internal Medicine

## 2013-07-26 VITALS — BP 132/90 | HR 82 | Temp 97.8°F | Wt 237.0 lb

## 2013-07-26 DIAGNOSIS — I10 Essential (primary) hypertension: Secondary | ICD-10-CM

## 2013-07-26 DIAGNOSIS — J309 Allergic rhinitis, unspecified: Secondary | ICD-10-CM

## 2013-07-26 DIAGNOSIS — E669 Obesity, unspecified: Secondary | ICD-10-CM

## 2013-07-26 DIAGNOSIS — K219 Gastro-esophageal reflux disease without esophagitis: Secondary | ICD-10-CM

## 2013-07-26 DIAGNOSIS — M545 Low back pain, unspecified: Secondary | ICD-10-CM

## 2013-07-26 DIAGNOSIS — F411 Generalized anxiety disorder: Secondary | ICD-10-CM

## 2013-07-26 DIAGNOSIS — E559 Vitamin D deficiency, unspecified: Secondary | ICD-10-CM

## 2013-07-26 DIAGNOSIS — Z1231 Encounter for screening mammogram for malignant neoplasm of breast: Secondary | ICD-10-CM

## 2013-07-26 DIAGNOSIS — E7800 Pure hypercholesterolemia, unspecified: Secondary | ICD-10-CM

## 2013-07-26 DIAGNOSIS — B0229 Other postherpetic nervous system involvement: Secondary | ICD-10-CM

## 2013-07-26 DIAGNOSIS — M999 Biomechanical lesion, unspecified: Secondary | ICD-10-CM

## 2013-07-26 LAB — CBC WITH DIFFERENTIAL/PLATELET
Basophils Absolute: 0 10*3/uL (ref 0.0–0.1)
Basophils Relative: 0.3 % (ref 0.0–3.0)
EOS PCT: 1 % (ref 0.0–5.0)
Eosinophils Absolute: 0.1 10*3/uL (ref 0.0–0.7)
HEMATOCRIT: 39.2 % (ref 36.0–46.0)
Hemoglobin: 13.2 g/dL (ref 12.0–15.0)
Lymphocytes Relative: 26.9 % (ref 12.0–46.0)
Lymphs Abs: 1.8 10*3/uL (ref 0.7–4.0)
MCHC: 33.6 g/dL (ref 30.0–36.0)
MCV: 91.6 fl (ref 78.0–100.0)
Monocytes Absolute: 0.7 10*3/uL (ref 0.1–1.0)
Monocytes Relative: 11 % (ref 3.0–12.0)
NEUTROS ABS: 4.1 10*3/uL (ref 1.4–7.7)
Neutrophils Relative %: 60.8 % (ref 43.0–77.0)
PLATELETS: 278 10*3/uL (ref 150.0–400.0)
RBC: 4.28 Mil/uL (ref 3.87–5.11)
RDW: 12.8 % (ref 11.5–14.6)
WBC: 6.7 10*3/uL (ref 4.5–10.5)

## 2013-07-26 LAB — BASIC METABOLIC PANEL
BUN: 14 mg/dL (ref 6–23)
CALCIUM: 9.7 mg/dL (ref 8.4–10.5)
CO2: 28 mEq/L (ref 19–32)
Chloride: 103 mEq/L (ref 96–112)
Creatinine, Ser: 0.7 mg/dL (ref 0.4–1.2)
GFR: 115.14 mL/min (ref >60.00)
Glucose, Bld: 83 mg/dL (ref 70–99)
Potassium: 3.9 mEq/L (ref 3.5–5.1)
SODIUM: 139 meq/L (ref 135–145)

## 2013-07-26 LAB — SEDIMENTATION RATE: Sed Rate: 4 mm/h (ref 0–22)

## 2013-07-26 LAB — LIPID PANEL
CHOL/HDL RATIO: 3
Cholesterol: 173 mg/dL (ref 0–200)
HDL: 67.8 mg/dL (ref >39.00)
LDL CALC: 85 mg/dL (ref 0–99)
Triglycerides: 100 mg/dL (ref 0.0–149.0)
VLDL: 20 mg/dL (ref 0.0–40.0)

## 2013-07-26 LAB — HEPATIC FUNCTION PANEL
ALK PHOS: 47 U/L (ref 39–117)
ALT: 35 U/L (ref 0–35)
AST: 33 U/L (ref 0–37)
Albumin: 4.1 g/dL (ref 3.5–5.2)
BILIRUBIN DIRECT: 0.1 mg/dL (ref 0.0–0.3)
Total Bilirubin: 0.8 mg/dL (ref 0.3–1.2)
Total Protein: 7.4 g/dL (ref 6.0–8.3)

## 2013-07-26 LAB — URIC ACID: Uric Acid, Serum: 5.6 mg/dL (ref 2.4–7.0)

## 2013-07-26 LAB — VITAMIN B12: Vitamin B-12: 357 pg/mL (ref 211–911)

## 2013-07-26 LAB — CK: Total CK: 117 U/L (ref 7–177)

## 2013-07-26 LAB — TSH: TSH: 0.61 u[IU]/mL (ref 0.35–5.50)

## 2013-07-26 MED ORDER — HYDROCODONE-ACETAMINOPHEN 5-325 MG PO TABS: 1.0000 | ORAL_TABLET | Freq: Three times a day (TID) | ORAL | Status: DC | PRN

## 2013-07-26 MED ORDER — CLONAZEPAM 1 MG PO TABS: ORAL_TABLET | ORAL | Status: DC

## 2013-07-26 MED ORDER — VITAMIN D 1000 UNITS PO TABS: 1000.0000 [IU] | ORAL_TABLET | Freq: Every day | ORAL | Status: DC

## 2013-07-26 MED ORDER — ZOLPIDEM TARTRATE 10 MG PO TABS: 10.0000 mg | ORAL_TABLET | Freq: Every evening | ORAL | Status: DC | PRN

## 2013-07-26 NOTE — Assessment & Plan Note (Signed)
Diet discussed 

## 2013-07-26 NOTE — Patient Instructions (Signed)
Hold Crestor 

## 2013-07-26 NOTE — Assessment & Plan Note (Signed)
FMS 

## 2013-07-26 NOTE — Assessment & Plan Note (Signed)
Continue with current prescription therapy as reflected on the Med list.  

## 2013-07-26 NOTE — Assessment & Plan Note (Signed)
Continue with current prn prescription therapy as reflected on the Med list.  

## 2013-07-26 NOTE — Assessment & Plan Note (Signed)
Continue with current prescription therapy as reflected on the Med list. Labs  

## 2013-07-26 NOTE — Assessment & Plan Note (Signed)
Continue with current prescription therapy as reflected on the Med list. No side effects w/Crestor per pt

## 2013-07-26 NOTE — Progress Notes (Signed)
Pre visit review using our clinic review tool, if applicable. No additional management support is needed unless otherwise documented below in the visit note. 

## 2013-07-26 NOTE — Progress Notes (Signed)
   Subjective:     HPI  Ne pt - switching from Dr Lenna Gilford The patient presents for a follow-up of  chronic hypertension, chronic dyslipidemia, anxiety C/o pain in joints, back, muscles all time  BP Readings from Last 3 Encounters:  07/26/13 132/90  02/01/13 142/94  11/01/12 140/84   Wt Readings from Last 3 Encounters:  07/26/13 237 lb (107.502 kg)  02/01/13 242 lb 6.4 oz (109.952 kg)  11/01/12 242 lb (109.77 kg)       Review of Systems  Constitutional: Positive for fatigue and unexpected weight change. Negative for fever, chills, diaphoresis, activity change and appetite change.  HENT: Negative for congestion, ear pain, facial swelling, hearing loss, mouth sores, nosebleeds, postnasal drip, rhinorrhea, sinus pressure, sneezing, sore throat, tinnitus and trouble swallowing.   Eyes: Negative for pain, discharge, redness, itching and visual disturbance.  Respiratory: Negative for cough, chest tightness, shortness of breath, wheezing and stridor.   Cardiovascular: Negative for chest pain, palpitations and leg swelling.  Gastrointestinal: Negative for nausea, diarrhea, constipation, blood in stool, abdominal distention, anal bleeding and rectal pain.  Genitourinary: Negative for dysuria, urgency, frequency, hematuria, flank pain, vaginal bleeding, vaginal discharge, difficulty urinating, genital sores and pelvic pain.  Musculoskeletal: Positive for arthralgias, back pain, myalgias and neck pain. Negative for gait problem, joint swelling and neck stiffness.  Skin: Negative.  Negative for rash.  Neurological: Negative for dizziness, tremors, seizures, syncope, speech difficulty, weakness, numbness and headaches.  Hematological: Negative for adenopathy. Does not bruise/bleed easily.  Psychiatric/Behavioral: Negative for suicidal ideas, behavioral problems, sleep disturbance, dysphoric mood and decreased concentration. The patient is not nervous/anxious.        Objective:   Physical  Exam  Constitutional: She appears well-developed. No distress.  obese  HENT:  Head: Normocephalic.  Right Ear: External ear normal.  Left Ear: External ear normal.  Nose: Nose normal.  Mouth/Throat: Oropharynx is clear and moist.  Eyes: Conjunctivae are normal. Pupils are equal, round, and reactive to light. Right eye exhibits no discharge. Left eye exhibits no discharge.  Neck: Normal range of motion. Neck supple. No JVD present. No tracheal deviation present. No thyromegaly present.  Cardiovascular: Normal rate, regular rhythm and normal heart sounds.   Pulmonary/Chest: No stridor. No respiratory distress. She has no wheezes.  Abdominal: Soft. Bowel sounds are normal. She exhibits no distension and no mass. There is no tenderness. There is no rebound and no guarding.  Musculoskeletal: She exhibits no edema and no tenderness.  LS is tender  Lymphadenopathy:    She has no cervical adenopathy.  Neurological: She displays normal reflexes. No cranial nerve deficit. She exhibits normal muscle tone. Coordination normal.  Skin: No rash noted. No erythema.  Psychiatric: She has a normal mood and affect. Her behavior is normal. Judgment and thought content normal.   Lab Results  Component Value Date   WBC 6.2 11/01/2012   HGB 12.2 11/01/2012   HCT 36.4 11/01/2012   PLT 241.0 11/01/2012   GLUCOSE 100* 11/01/2012   CHOL 149 11/01/2012   TRIG 103.0 11/01/2012   HDL 55.90 11/01/2012   LDLDIRECT 160.7 02/06/2012   LDLCALC 73 11/01/2012   ALT 31 11/01/2012   AST 29 11/01/2012   NA 141 11/01/2012   K 4.9 11/01/2012   CL 107 11/01/2012   CREATININE 0.8 11/01/2012   BUN 12 11/01/2012   CO2 29 11/01/2012   TSH 0.43 11/01/2012          Assessment & Plan:

## 2013-07-27 ENCOUNTER — Ambulatory Visit: Payer: BC Managed Care – PPO | Admitting: Pulmonary Disease

## 2013-07-27 ENCOUNTER — Encounter: Payer: Self-pay | Admitting: *Deleted

## 2013-07-27 ENCOUNTER — Telehealth: Payer: Self-pay | Admitting: Internal Medicine

## 2013-07-27 LAB — VITAMIN D 25 HYDROXY (VIT D DEFICIENCY, FRACTURES): VIT D 25 HYDROXY: 37 ng/mL (ref 30–89)

## 2013-07-27 NOTE — Telephone Encounter (Signed)
Relevant patient education mailed to patient.  

## 2013-07-29 ENCOUNTER — Other Ambulatory Visit: Payer: Self-pay | Admitting: Obstetrics

## 2013-07-29 DIAGNOSIS — R928 Other abnormal and inconclusive findings on diagnostic imaging of breast: Secondary | ICD-10-CM

## 2013-08-10 ENCOUNTER — Ambulatory Visit
Admission: RE | Admit: 2013-08-10 | Discharge: 2013-08-10 | Disposition: A | Discharge status: Home / Self Care | Payer: BC Managed Care – PPO | Source: Ambulatory Visit | Attending: Obstetrics | Admitting: Obstetrics

## 2013-08-10 DIAGNOSIS — R928 Other abnormal and inconclusive findings on diagnostic imaging of breast: Secondary | ICD-10-CM

## 2013-08-30 ENCOUNTER — Other Ambulatory Visit: Payer: Self-pay | Admitting: Pulmonary Disease

## 2013-09-15 ENCOUNTER — Telehealth: Payer: Self-pay

## 2013-09-15 NOTE — Telephone Encounter (Signed)
Pt states that she has already had the shingles virus once in her life before.  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  She wants to know will she have a chance of getting shingles or chicken pox again. Please advise

## 2013-09-16 NOTE — Telephone Encounter (Signed)
Pt.notified

## 2013-09-16 NOTE — Telephone Encounter (Signed)
Jessica Lynn should be fine Thx

## 2013-10-04 ENCOUNTER — Other Ambulatory Visit: Payer: Self-pay | Admitting: *Deleted

## 2013-10-04 DIAGNOSIS — F411 Generalized anxiety disorder: Secondary | ICD-10-CM

## 2013-10-04 DIAGNOSIS — B0229 Other postherpetic nervous system involvement: Secondary | ICD-10-CM

## 2013-10-04 DIAGNOSIS — E669 Obesity, unspecified: Secondary | ICD-10-CM

## 2013-10-04 DIAGNOSIS — I10 Essential (primary) hypertension: Secondary | ICD-10-CM

## 2013-10-04 DIAGNOSIS — E559 Vitamin D deficiency, unspecified: Secondary | ICD-10-CM

## 2013-10-04 DIAGNOSIS — J309 Allergic rhinitis, unspecified: Secondary | ICD-10-CM

## 2013-10-04 DIAGNOSIS — E7800 Pure hypercholesterolemia, unspecified: Secondary | ICD-10-CM

## 2013-10-04 NOTE — Telephone Encounter (Signed)
Requesting refill on her hydrocodone.../lmb 

## 2013-10-05 MED ORDER — HYDROCODONE-ACETAMINOPHEN 5-325 MG PO TABS: 1.0000 | ORAL_TABLET | Freq: Three times a day (TID) | ORAL | Status: DC | PRN

## 2013-10-05 NOTE — Telephone Encounter (Signed)
Called pt no answer [facility] rx ready for pick-up.../lmb 

## 2013-10-12 ENCOUNTER — Ambulatory Visit (INDEPENDENT_AMBULATORY_CARE_PROVIDER_SITE_OTHER): Payer: BC Managed Care – PPO

## 2013-10-12 ENCOUNTER — Ambulatory Visit (INDEPENDENT_AMBULATORY_CARE_PROVIDER_SITE_OTHER): Payer: BC Managed Care – PPO | Admitting: Family Medicine

## 2013-10-12 VITALS — BP 150/82 | HR 84 | Temp 97.4°F | Resp 18 | Ht 66.25 in | Wt 239.0 lb

## 2013-10-12 DIAGNOSIS — M25559 Pain in unspecified hip: Secondary | ICD-10-CM

## 2013-10-12 DIAGNOSIS — M25552 Pain in left hip: Secondary | ICD-10-CM

## 2013-10-12 MED ORDER — PREDNISONE 20 MG PO TABS: 20.0000 mg | ORAL_TABLET | Freq: Every day | ORAL | Status: DC

## 2013-10-12 MED ORDER — CYCLOBENZAPRINE HCL 5 MG PO TABS: 5.0000 mg | ORAL_TABLET | Freq: Three times a day (TID) | ORAL | Status: DC | PRN

## 2013-10-12 MED ORDER — PREDNISONE 20 MG PO TABS: ORAL_TABLET | ORAL | Status: DC

## 2013-10-12 MED ORDER — KETOROLAC TROMETHAMINE 60 MG/2ML IM SOLN
60.0000 mg | Freq: Once | INTRAMUSCULAR | Status: AC
Start: 2013-10-12 — End: 2013-10-12
  Administered 2013-10-12: 60 mg via INTRAMUSCULAR

## 2013-10-12 MED ORDER — OXYCODONE-ACETAMINOPHEN 10-325 MG PO TABS: 1.0000 | ORAL_TABLET | Freq: Three times a day (TID) | ORAL | Status: DC | PRN

## 2013-10-12 NOTE — Progress Notes (Addendum)
Subjective: Patient has been having pain in her left groin region since getting up this morning. It is very intense. She did go to work and try to work for several hours at [employer]. She is doing picking.  After about 3 hours she couldn't stand it any longer and left. She has some chronic pain for which he takes hydrocodone when she has it. She currently does not have any. She has a regular primary care physician. She also saw an orthopedist for the similar problem in the right hip 2 years ago. A shot of medicine, pain pills (Percocet), muscle relaxant, and prednisone and it calmed down. Knows of no injury. Work yesterday.  Objective: Obese lady in no acute distress when sitting still, but when she moves she hurts intensely. Abdomen soft. Tender in the left groin region medially. Hurts to move her hip around cannot straighten her leg when lying down. It, keeping it didn't at about a 60 angle. Difficult to examine well. Straight leg raising seemed to be negative.  Assessment: Hip pain and left groin pain  Plan: X-ray left hip  [facility] reading (PRIMARY) by  Dr. Linna Darner Normal hip . Will treat with anti-inflammatory, pain, and muscle relaxation medication. If she's not improving she is to return.

## 2013-10-12 NOTE — Addendum Note (Signed)
Addended by: Aylen Rambert H on: 10/12/2013 04:00 PM   Modules accepted: Level of Service

## 2013-10-12 NOTE — Patient Instructions (Addendum)
Take the Flexeril (cyclobenzaprine) one in the morning, one in the afternoon, and one or 2 at bedtime for muscle relaxation.  Take the oxycodone (Percocet) only when needed for very severe pain  Take the prednisone 3 daily for 2 days, then 2 daily for 2 days, then one daily for 2 days  Rest for the next 36 hours. If you continue to hurt a lot by Friday may need to stay off then also.  Return if not improving over the next 4-5 days

## 2013-10-27 ENCOUNTER — Encounter: Payer: Self-pay | Admitting: Internal Medicine

## 2013-10-27 ENCOUNTER — Ambulatory Visit (INDEPENDENT_AMBULATORY_CARE_PROVIDER_SITE_OTHER): Payer: BC Managed Care – PPO | Admitting: Internal Medicine

## 2013-10-27 VITALS — BP 118/70 | HR 102 | Temp 98.5°F | Ht 66.25 in | Wt 237.0 lb

## 2013-10-27 DIAGNOSIS — E7800 Pure hypercholesterolemia, unspecified: Secondary | ICD-10-CM

## 2013-10-27 DIAGNOSIS — E559 Vitamin D deficiency, unspecified: Secondary | ICD-10-CM

## 2013-10-27 DIAGNOSIS — M150 Primary generalized (osteo)arthritis: Secondary | ICD-10-CM

## 2013-10-27 DIAGNOSIS — J309 Allergic rhinitis, unspecified: Secondary | ICD-10-CM

## 2013-10-27 DIAGNOSIS — I10 Essential (primary) hypertension: Secondary | ICD-10-CM

## 2013-10-27 DIAGNOSIS — M545 Low back pain, unspecified: Secondary | ICD-10-CM

## 2013-10-27 DIAGNOSIS — E669 Obesity, unspecified: Secondary | ICD-10-CM

## 2013-10-27 DIAGNOSIS — F411 Generalized anxiety disorder: Secondary | ICD-10-CM

## 2013-10-27 DIAGNOSIS — M2550 Pain in unspecified joint: Secondary | ICD-10-CM | POA: Insufficient documentation

## 2013-10-27 DIAGNOSIS — B0229 Other postherpetic nervous system involvement: Secondary | ICD-10-CM

## 2013-10-27 DIAGNOSIS — G4700 Insomnia, unspecified: Secondary | ICD-10-CM

## 2013-10-27 DIAGNOSIS — M8949 Other hypertrophic osteoarthropathy, multiple sites: Secondary | ICD-10-CM

## 2013-10-27 DIAGNOSIS — F419 Anxiety disorder, unspecified: Secondary | ICD-10-CM

## 2013-10-27 DIAGNOSIS — M159 Polyosteoarthritis, unspecified: Secondary | ICD-10-CM

## 2013-10-27 MED ORDER — CLONAZEPAM 1 MG PO TABS: ORAL_TABLET | ORAL | Status: DC

## 2013-10-27 MED ORDER — ZOLPIDEM TARTRATE 10 MG PO TABS: ORAL_TABLET | ORAL | Status: DC

## 2013-10-27 MED ORDER — HYDROCODONE-ACETAMINOPHEN 5-325 MG PO TABS: 1.0000 | ORAL_TABLET | Freq: Three times a day (TID) | ORAL | Status: DC | PRN

## 2013-10-27 NOTE — Assessment & Plan Note (Signed)
Potential benefits of a long term Norco use as well as potential risks  and complications were explained to the patient and were aknowledged. Renewed Rx Wt loss

## 2013-10-27 NOTE — Assessment & Plan Note (Signed)
Potential benefits of a long term benzodiazepines  use as well as potential risks  and complications were explained to the patient and were aknowledged. Continue with current prn prescription therapy as reflected on the Med list.  

## 2013-10-27 NOTE — Progress Notes (Signed)
Patient ID: Jessica Lynn, female   DOB: December 11, 1960, 53 y.o.   MRN: [id number]   Subjective:     HPI   The patient presents for a follow-up of  Chronic LBP, hypertension, chronic dyslipidemia, anxiety F/upain in joints, back, muscles all time C/o pain in L groin/hip - went to [facility]  BP Readings from Last 3 Encounters:  10/27/13 118/70  10/12/13 150/82  07/26/13 132/90   Wt Readings from Last 3 Encounters:  10/27/13 237 lb (107.502 kg)  10/12/13 239 lb (108.41 kg)  07/26/13 237 lb (107.502 kg)       Review of Systems  Constitutional: Positive for fatigue and unexpected weight change. Negative for fever, chills, diaphoresis, activity change and appetite change.  HENT: Negative for congestion, ear pain, facial swelling, hearing loss, mouth sores, nosebleeds, postnasal drip, rhinorrhea, sinus pressure, sneezing, sore throat, tinnitus and trouble swallowing.   Eyes: Negative for pain, discharge, redness, itching and visual disturbance.  Respiratory: Negative for cough, chest tightness, shortness of breath, wheezing and stridor.   Cardiovascular: Negative for chest pain, palpitations and leg swelling.  Gastrointestinal: Negative for nausea, diarrhea, constipation, blood in stool, abdominal distention, anal bleeding and rectal pain.  Genitourinary: Negative for dysuria, urgency, frequency, hematuria, flank pain, vaginal bleeding, vaginal discharge, difficulty urinating, genital sores and pelvic pain.  Musculoskeletal: Positive for arthralgias, back pain, myalgias and neck pain. Negative for gait problem, joint swelling and neck stiffness.  Skin: Negative.  Negative for rash.  Neurological: Negative for dizziness, tremors, seizures, syncope, speech difficulty, weakness, numbness and headaches.  Hematological: Negative for adenopathy. Does not bruise/bleed easily.  Psychiatric/Behavioral: Negative for suicidal ideas, behavioral problems, sleep disturbance, dysphoric mood and decreased  concentration. The patient is not nervous/anxious.        Objective:   Physical Exam  Constitutional: She appears well-developed. No distress.  obese  HENT:  Head: Normocephalic.  Right Ear: External ear normal.  Left Ear: External ear normal.  Nose: Nose normal.  Mouth/Throat: Oropharynx is clear and moist.  Eyes: Conjunctivae are normal. Pupils are equal, round, and reactive to light. Right eye exhibits no discharge. Left eye exhibits no discharge.  Neck: Normal range of motion. Neck supple. No JVD present. No tracheal deviation present. No thyromegaly present.  Cardiovascular: Normal rate, regular rhythm and normal heart sounds.   Pulmonary/Chest: No stridor. No respiratory distress. She has no wheezes.  Abdominal: Soft. Bowel sounds are normal. She exhibits no distension and no mass. There is no tenderness. There is no rebound and no guarding.  Musculoskeletal: She exhibits no edema and no tenderness.  LS is not tender B groin area NT Hips w/full ROM  Lymphadenopathy:    She has no cervical adenopathy.  Neurological: She displays normal reflexes. No cranial nerve deficit. She exhibits normal muscle tone. Coordination normal.  Skin: No rash noted. No erythema.  Psychiatric: She has a normal mood and affect. Her behavior is normal. Judgment and thought content normal.   Lab Results  Component Value Date   WBC 6.7 07/26/2013   HGB 13.2 07/26/2013   HCT 39.2 07/26/2013   PLT 278.0 07/26/2013   GLUCOSE 83 07/26/2013   CHOL 173 07/26/2013   TRIG 100.0 07/26/2013   HDL 67.80 07/26/2013   LDLDIRECT 160.7 02/06/2012   LDLCALC 85 07/26/2013   ALT 35 07/26/2013   AST 33 07/26/2013   NA 139 07/26/2013   K 3.9 07/26/2013   CL 103 07/26/2013   CREATININE 0.7 07/26/2013   BUN 14 07/26/2013  CO2 28 07/26/2013   TSH 0.61 07/26/2013          Assessment & Plan:

## 2013-10-27 NOTE — Progress Notes (Signed)
Pre visit review using our clinic review tool, if applicable. No additional management support is needed unless otherwise documented below in the visit note. 

## 2013-10-27 NOTE — Assessment & Plan Note (Signed)
Continue with current prescription therapy as reflected on the Med list.  

## 2013-10-27 NOTE — Assessment & Plan Note (Signed)
Chronic   Potential benefits of a long term benzodiazepines  use as well as potential risks  and complications were explained to the patient and were aknowledged. Continue with current prn prescription therapy as reflected on the Med list.  

## 2013-10-27 NOTE — Assessment & Plan Note (Signed)
Recurrent  Continue with current prn prescription therapy as reflected on the Med list.

## 2013-10-27 NOTE — Assessment & Plan Note (Signed)
Better on diet 

## 2013-12-01 ENCOUNTER — Telehealth: Payer: Self-pay | Admitting: General Practice

## 2013-12-01 DIAGNOSIS — J309 Allergic rhinitis, unspecified: Secondary | ICD-10-CM

## 2013-12-01 DIAGNOSIS — I10 Essential (primary) hypertension: Secondary | ICD-10-CM

## 2013-12-01 DIAGNOSIS — E559 Vitamin D deficiency, unspecified: Secondary | ICD-10-CM

## 2013-12-01 DIAGNOSIS — E669 Obesity, unspecified: Secondary | ICD-10-CM

## 2013-12-01 DIAGNOSIS — E7800 Pure hypercholesterolemia, unspecified: Secondary | ICD-10-CM

## 2013-12-01 DIAGNOSIS — B0229 Other postherpetic nervous system involvement: Secondary | ICD-10-CM

## 2013-12-01 DIAGNOSIS — F411 Generalized anxiety disorder: Secondary | ICD-10-CM

## 2013-12-01 NOTE — Telephone Encounter (Signed)
Pt called to give you CASE ID# [id number], [phone] [facility].  Pt states she needs a PA on Zolpidem.  Pt also states she needs a re-fill on HYDROcodone-acetaminophen (NORCO) 5-325 MG per tablet

## 2013-12-02 ENCOUNTER — Other Ambulatory Visit: Payer: Self-pay

## 2013-12-02 MED ORDER — HYDROCODONE-ACETAMINOPHEN 5-325 MG PO TABS: 1.0000 | ORAL_TABLET | Freq: Three times a day (TID) | ORAL | Status: DC | PRN

## 2013-12-02 MED ORDER — ZOLPIDEM TARTRATE 10 MG PO TABS: ORAL_TABLET | ORAL | Status: DC

## 2013-12-02 NOTE — Telephone Encounter (Signed)
Rx printed and given to pt. Will call for Zolpidem PA.

## 2013-12-02 NOTE — Progress Notes (Unsigned)
Received PA request for zolpidem 10 mg po qhs prn for sleep.  Submitted PA request, awaiting approval

## 2013-12-02 NOTE — Telephone Encounter (Signed)
Ok to ref [employer]

## 2013-12-06 ENCOUNTER — Other Ambulatory Visit: Payer: Self-pay | Admitting: Pulmonary Disease

## 2013-12-16 ENCOUNTER — Other Ambulatory Visit: Payer: Self-pay | Admitting: *Deleted

## 2013-12-16 MED ORDER — LISINOPRIL-HYDROCHLOROTHIAZIDE 20-12.5 MG PO TABS: 1.0000 | ORAL_TABLET | Freq: Every day | ORAL | Status: DC

## 2013-12-21 NOTE — Progress Notes (Unsigned)
Submitted PA for zolpidem 10 mg

## 2014-01-05 ENCOUNTER — Other Ambulatory Visit: Payer: Self-pay | Admitting: Internal Medicine

## 2014-01-05 ENCOUNTER — Telehealth: Payer: Self-pay | Admitting: *Deleted

## 2014-01-05 ENCOUNTER — Other Ambulatory Visit: Payer: Self-pay

## 2014-01-05 DIAGNOSIS — G894 Chronic pain syndrome: Secondary | ICD-10-CM

## 2014-01-05 DIAGNOSIS — E7800 Pure hypercholesterolemia, unspecified: Secondary | ICD-10-CM

## 2014-01-05 DIAGNOSIS — F4323 Adjustment disorder with mixed anxiety and depressed mood: Secondary | ICD-10-CM

## 2014-01-05 DIAGNOSIS — E559 Vitamin D deficiency, unspecified: Secondary | ICD-10-CM

## 2014-01-05 DIAGNOSIS — E669 Obesity, unspecified: Secondary | ICD-10-CM

## 2014-01-05 DIAGNOSIS — I10 Essential (primary) hypertension: Secondary | ICD-10-CM

## 2014-01-05 DIAGNOSIS — J309 Allergic rhinitis, unspecified: Secondary | ICD-10-CM

## 2014-01-05 MED ORDER — HYDROCODONE-ACETAMINOPHEN 5-325 MG PO TABS: 1.0000 | ORAL_TABLET | Freq: Three times a day (TID) | ORAL | Status: DC | PRN

## 2014-01-05 NOTE — Telephone Encounter (Signed)
Done and signed 

## 2014-01-05 NOTE — Telephone Encounter (Signed)
Pt return call back inform her rx ready for pick-up...Jessica Lynn

## 2014-01-05 NOTE — Telephone Encounter (Signed)
Left msg on triage requesting refill on her hydrocodone. MD out of office. Is this ok to refill...Jessica Lynn

## 2014-01-05 NOTE — Telephone Encounter (Signed)
Trid calling pt no answer & can't leave msg due to vm being full...Jessica Lynn

## 2014-01-19 ENCOUNTER — Telehealth: Payer: Self-pay | Admitting: *Deleted

## 2014-01-19 NOTE — Telephone Encounter (Signed)
Zolpidem 10 mg PA is approved from 12/29/13 until 01/19/15. Pharmacy informed

## 2014-02-08 ENCOUNTER — Encounter: Payer: Self-pay | Admitting: Internal Medicine

## 2014-02-08 ENCOUNTER — Ambulatory Visit (INDEPENDENT_AMBULATORY_CARE_PROVIDER_SITE_OTHER): Payer: BC Managed Care – PPO | Admitting: Internal Medicine

## 2014-02-08 VITALS — BP 120/82 | HR 96 | Temp 98.4°F | Wt 240.0 lb

## 2014-02-08 DIAGNOSIS — G5603 Carpal tunnel syndrome, bilateral upper limbs: Secondary | ICD-10-CM

## 2014-02-08 DIAGNOSIS — F4323 Adjustment disorder with mixed anxiety and depressed mood: Secondary | ICD-10-CM

## 2014-02-08 DIAGNOSIS — G894 Chronic pain syndrome: Secondary | ICD-10-CM

## 2014-02-08 DIAGNOSIS — J309 Allergic rhinitis, unspecified: Secondary | ICD-10-CM

## 2014-02-08 DIAGNOSIS — E7800 Pure hypercholesterolemia, unspecified: Secondary | ICD-10-CM

## 2014-02-08 DIAGNOSIS — E669 Obesity, unspecified: Secondary | ICD-10-CM

## 2014-02-08 DIAGNOSIS — G5602 Carpal tunnel syndrome, left upper limb: Secondary | ICD-10-CM

## 2014-02-08 DIAGNOSIS — I10 Essential (primary) hypertension: Secondary | ICD-10-CM

## 2014-02-08 DIAGNOSIS — G5601 Carpal tunnel syndrome, right upper limb: Secondary | ICD-10-CM

## 2014-02-08 DIAGNOSIS — E559 Vitamin D deficiency, unspecified: Secondary | ICD-10-CM

## 2014-02-08 MED ORDER — CLONAZEPAM 1 MG PO TABS: ORAL_TABLET | ORAL | Status: DC

## 2014-02-08 MED ORDER — ZOLPIDEM TARTRATE 10 MG PO TABS: ORAL_TABLET | ORAL | Status: DC

## 2014-02-08 MED ORDER — HYDROXYZINE HCL 10 MG PO TABS: 10.0000 mg | ORAL_TABLET | Freq: Three times a day (TID) | ORAL | Status: DC | PRN

## 2014-02-08 MED ORDER — ROSUVASTATIN CALCIUM 20 MG PO TABS: 20.0000 mg | ORAL_TABLET | Freq: Every day | ORAL | Status: DC

## 2014-02-08 MED ORDER — NAPROXEN 500 MG PO TABS: 500.0000 mg | ORAL_TABLET | Freq: Two times a day (BID) | ORAL | Status: DC

## 2014-02-08 MED ORDER — HYDROCODONE-ACETAMINOPHEN 5-325 MG PO TABS: 1.0000 | ORAL_TABLET | Freq: Three times a day (TID) | ORAL | Status: DC | PRN

## 2014-02-08 MED ORDER — VITAMIN D 1000 UNITS PO TABS: 1000.0000 [IU] | ORAL_TABLET | Freq: Every day | ORAL | Status: DC

## 2014-02-08 MED ORDER — LISINOPRIL-HYDROCHLOROTHIAZIDE 20-12.5 MG PO TABS: 1.0000 | ORAL_TABLET | Freq: Every day | ORAL | Status: DC

## 2014-02-08 NOTE — Progress Notes (Signed)
Pre visit review using our clinic review tool, if applicable. No additional management support is needed unless otherwise documented below in the visit note. 

## 2014-02-08 NOTE — Patient Instructions (Signed)
Carpal Tunnel Syndrome The carpal tunnel is a narrow area located on the palm side of your wrist. The tunnel is formed by the wrist bones and ligaments. Nerves, blood vessels, and tendons pass through the carpal tunnel. Repeated wrist motion or certain diseases may cause swelling within the tunnel. This swelling pinches the main nerve in the wrist (median nerve) and causes the painful hand and arm condition called carpal tunnel syndrome. CAUSES   Repeated wrist motions.  Wrist injuries.  Certain diseases like arthritis, diabetes, alcoholism, hyperthyroidism, and kidney failure.  Obesity.  Pregnancy. SYMPTOMS   A "pins and needles" feeling in your fingers or hand, especially in your thumb, index and middle fingers.  Tingling or numbness in your fingers or hand.  An aching feeling in your entire arm, especially when your wrist and elbow are bent for long periods of time.  Wrist pain that goes up your arm to your shoulder.  Pain that goes down into your palm or fingers.  A weak feeling in your hands. DIAGNOSIS  Your health care provider will take your history and perform a physical exam. An electromyography test may be needed. This test measures electrical signals sent out by your nerves into the muscles. The electrical signals are usually slowed by carpal tunnel syndrome. You may also need X-rays. TREATMENT  Carpal tunnel syndrome may clear up by itself. Your health care provider may recommend a wrist splint or medicine such as a nonsteroidal anti-inflammatory medicine. Cortisone injections may help. Sometimes, surgery may be needed to free the pinched nerve.  HOME CARE INSTRUCTIONS   Take all medicine as directed by your health care provider. Only take over-the-counter or prescription medicines for pain, discomfort, or fever as directed by your health care provider.  If you were given a splint to keep your wrist from bending, wear it as directed. It is important to wear the splint at  night. Wear the splint for as long as you have pain or numbness in your hand, arm, or wrist. This may take 1 to 2 months.  Rest your wrist from any activity that may be causing your pain. If your symptoms are work-related, you may need to talk to your employer about changing to a job that does not require using your wrist.  Put ice on your wrist after long periods of wrist activity.  Put ice in a plastic bag.  Place a towel between your skin and the bag.  Leave the ice on for 15-20 minutes, 03-04 times a day.  Keep all follow-up visits as directed by your health care provider. This includes any orthopedic referrals, physical therapy, and rehabilitation. Any delay in getting necessary care could result in a delay or failure of your condition to heal. SEEK IMMEDIATE MEDICAL CARE IF:   You have new, unexplained symptoms.  Your symptoms get worse and are not helped or controlled with medicines. MAKE SURE YOU:   Understand these instructions.  Will watch your condition.  Will get help right away if you are not doing well or get worse. Document Released: 03/21/2000 Document Revised: 08/08/2013 Document Reviewed: 02/07/2011 Exit[employer] Patient Information 2015 [employer]. This information is not intended to replace advice given to you by your health care provider. Make sure you discuss any questions you have with your health care provider.  

## 2014-02-09 ENCOUNTER — Encounter: Payer: Self-pay | Admitting: Internal Medicine

## 2014-03-13 ENCOUNTER — Telehealth: Payer: Self-pay | Admitting: Internal Medicine

## 2014-03-13 DIAGNOSIS — F4323 Adjustment disorder with mixed anxiety and depressed mood: Secondary | ICD-10-CM

## 2014-03-13 DIAGNOSIS — I10 Essential (primary) hypertension: Secondary | ICD-10-CM

## 2014-03-13 DIAGNOSIS — G894 Chronic pain syndrome: Secondary | ICD-10-CM

## 2014-03-13 DIAGNOSIS — E7800 Pure hypercholesterolemia, unspecified: Secondary | ICD-10-CM

## 2014-03-13 DIAGNOSIS — E669 Obesity, unspecified: Secondary | ICD-10-CM

## 2014-03-13 DIAGNOSIS — E559 Vitamin D deficiency, unspecified: Secondary | ICD-10-CM

## 2014-03-13 DIAGNOSIS — J309 Allergic rhinitis, unspecified: Secondary | ICD-10-CM

## 2014-03-13 MED ORDER — HYDROCODONE-ACETAMINOPHEN 5-325 MG PO TABS: 1.0000 | ORAL_TABLET | Freq: Three times a day (TID) | ORAL | Status: DC | PRN

## 2014-03-13 MED ORDER — ZOLPIDEM TARTRATE 10 MG PO TABS: ORAL_TABLET | ORAL | Status: DC

## 2014-03-13 NOTE — Telephone Encounter (Signed)
OK. Thx

## 2014-03-13 NOTE — Telephone Encounter (Signed)
Pt called in and requesting refill on zolpidem (AMBIEN) 10 MG tablet [[id number]] and Hydrocodone

## 2014-03-14 NOTE — Telephone Encounter (Signed)
Pt return call back inform rx ready for pick-up...Jessica Lynn

## 2014-03-14 NOTE — Telephone Encounter (Signed)
Tried calling pt rx is ready for pick-up can't leave msg due to mail box full...Jessica Lynn

## 2014-03-17 ENCOUNTER — Other Ambulatory Visit: Payer: Self-pay | Admitting: Obstetrics

## 2014-03-17 ENCOUNTER — Other Ambulatory Visit: Payer: Self-pay

## 2014-03-17 DIAGNOSIS — R921 Mammographic calcification found on diagnostic imaging of breast: Secondary | ICD-10-CM

## 2014-03-27 ENCOUNTER — Other Ambulatory Visit: Payer: Self-pay | Admitting: Internal Medicine

## 2014-03-27 DIAGNOSIS — R921 Mammographic calcification found on diagnostic imaging of breast: Secondary | ICD-10-CM

## 2014-03-29 ENCOUNTER — Other Ambulatory Visit: Payer: Self-pay | Admitting: Obstetrics

## 2014-03-29 DIAGNOSIS — R921 Mammographic calcification found on diagnostic imaging of breast: Secondary | ICD-10-CM

## 2014-04-03 ENCOUNTER — Ambulatory Visit
Admission: RE | Admit: 2014-04-03 | Discharge: 2014-04-03 | Disposition: A | Discharge status: Home / Self Care | Payer: BC Managed Care – PPO | Source: Ambulatory Visit | Attending: Obstetrics | Admitting: Obstetrics

## 2014-04-03 DIAGNOSIS — R921 Mammographic calcification found on diagnostic imaging of breast: Secondary | ICD-10-CM

## 2014-04-13 ENCOUNTER — Other Ambulatory Visit: Payer: Self-pay | Admitting: Internal Medicine

## 2014-04-13 DIAGNOSIS — G894 Chronic pain syndrome: Secondary | ICD-10-CM

## 2014-04-13 DIAGNOSIS — J309 Allergic rhinitis, unspecified: Secondary | ICD-10-CM

## 2014-04-13 DIAGNOSIS — I10 Essential (primary) hypertension: Secondary | ICD-10-CM

## 2014-04-13 DIAGNOSIS — F4323 Adjustment disorder with mixed anxiety and depressed mood: Secondary | ICD-10-CM

## 2014-04-13 DIAGNOSIS — E559 Vitamin D deficiency, unspecified: Secondary | ICD-10-CM

## 2014-04-13 DIAGNOSIS — E669 Obesity, unspecified: Secondary | ICD-10-CM

## 2014-04-13 DIAGNOSIS — E7800 Pure hypercholesterolemia, unspecified: Secondary | ICD-10-CM

## 2014-04-13 NOTE — Telephone Encounter (Signed)
Refill request for hydrocodone Last filled by MD on- 03/13/14 #90 x0 Last Appt 02/08/2014 Next Appt: 05/12/2014 Please advise refill?

## 2014-04-13 NOTE — Telephone Encounter (Signed)
Is requesting refill script on vicodin?

## 2014-04-14 NOTE — Telephone Encounter (Signed)
Patient called in asking for status of refill request. Advised still pending dr plotnikovs review.

## 2014-04-17 NOTE — Telephone Encounter (Signed)
Patient has called back in regards to this

## 2014-04-18 MED ORDER — HYDROCODONE-ACETAMINOPHEN 5-325 MG PO TABS: 1.0000 | ORAL_TABLET | Freq: Three times a day (TID) | ORAL | Status: DC | PRN

## 2014-04-18 NOTE — Telephone Encounter (Signed)
Ok to ref [employer]

## 2014-04-18 NOTE — Telephone Encounter (Signed)
Rx printed/upfront for p/u.. Pt informed  

## 2014-05-12 ENCOUNTER — Ambulatory Visit (INDEPENDENT_AMBULATORY_CARE_PROVIDER_SITE_OTHER): Payer: BC Managed Care – PPO | Admitting: Internal Medicine

## 2014-05-12 ENCOUNTER — Encounter: Payer: Self-pay | Admitting: Internal Medicine

## 2014-05-12 VITALS — BP 166/100 | HR 89 | Temp 98.6°F | Wt 245.0 lb

## 2014-05-12 DIAGNOSIS — F4323 Adjustment disorder with mixed anxiety and depressed mood: Secondary | ICD-10-CM

## 2014-05-12 DIAGNOSIS — E7800 Pure hypercholesterolemia, unspecified: Secondary | ICD-10-CM

## 2014-05-12 DIAGNOSIS — G894 Chronic pain syndrome: Secondary | ICD-10-CM

## 2014-05-12 DIAGNOSIS — J309 Allergic rhinitis, unspecified: Secondary | ICD-10-CM

## 2014-05-12 DIAGNOSIS — I10 Essential (primary) hypertension: Secondary | ICD-10-CM

## 2014-05-12 DIAGNOSIS — E669 Obesity, unspecified: Secondary | ICD-10-CM

## 2014-05-12 DIAGNOSIS — E559 Vitamin D deficiency, unspecified: Secondary | ICD-10-CM

## 2014-05-12 MED ORDER — HYDROCODONE-ACETAMINOPHEN 5-325 MG PO TABS: 1.0000 | ORAL_TABLET | Freq: Three times a day (TID) | ORAL | Status: DC | PRN

## 2014-05-12 MED ORDER — CLONAZEPAM 1 MG PO TABS: ORAL_TABLET | ORAL | Status: DC

## 2014-05-12 MED ORDER — ZOLPIDEM TARTRATE 10 MG PO TABS: ORAL_TABLET | ORAL | Status: DC

## 2014-05-12 MED ORDER — NAPROXEN 500 MG PO TABS: 500.0000 mg | ORAL_TABLET | Freq: Two times a day (BID) | ORAL | Status: DC

## 2014-05-12 MED ORDER — DULOXETINE HCL 30 MG PO CPEP: 30.0000 mg | ORAL_CAPSULE | Freq: Every day | ORAL | Status: DC

## 2014-05-12 MED ORDER — VITAMIN D (ERGOCALCIFEROL) 1.25 MG (50000 UNIT) PO CAPS: ORAL_CAPSULE | ORAL | Status: DC

## 2014-05-12 MED ORDER — LISINOPRIL-HYDROCHLOROTHIAZIDE 10-12.5 MG PO TABS: 1.0000 | ORAL_TABLET | Freq: Every day | ORAL | Status: DC

## 2014-05-12 NOTE — Progress Notes (Signed)
Pre visit review using our clinic review tool, if applicable. No additional management support is needed unless otherwise documented below in the visit note. 

## 2014-05-12 NOTE — Progress Notes (Signed)
Patient ID: Jessica Lynn, female   DOB: March 01, 1961, 54 y.o.   MRN: [id number]   Subjective:     HPI  C/o "being mean", stressed out.... C/o insomnia The patient presents for a follow-up of  Chronic LBP, hypertension, chronic dyslipidemia, anxiety F/u pain in joints, back, muscles all time. C/o B arm pain, hands hurt and numb - pain is 7/10 C/o pain in L groin/hip - went to [facility]  BP Readings from Last 3 Encounters:  05/12/14 166/100  02/08/14 120/82  10/27/13 118/70   Wt Readings from Last 3 Encounters:  05/12/14 245 lb (111.131 kg)  02/08/14 240 lb (108.863 kg)  10/27/13 237 lb (107.502 kg)    Review of Systems  Constitutional: Positive for fatigue and unexpected weight change. Negative for fever, chills, diaphoresis, activity change and appetite change.  HENT: Negative for congestion, ear pain, facial swelling, hearing loss, mouth sores, nosebleeds, postnasal drip, rhinorrhea, sinus pressure, sneezing, sore throat, tinnitus and trouble swallowing.   Eyes: Negative for pain, discharge, redness, itching and visual disturbance.  Respiratory: Negative for cough, chest tightness, shortness of breath, wheezing and stridor.   Cardiovascular: Negative for chest pain, palpitations and leg swelling.  Gastrointestinal: Negative for nausea, diarrhea, constipation, blood in stool, abdominal distention, anal bleeding and rectal pain.  Genitourinary: Negative for dysuria, urgency, frequency, hematuria, flank pain, vaginal bleeding, vaginal discharge, difficulty urinating, genital sores and pelvic pain.  Musculoskeletal: Positive for myalgias, back pain, arthralgias and neck pain. Negative for joint swelling, gait problem and neck stiffness.  Skin: Negative.  Negative for rash.  Neurological: Negative for dizziness, tremors, seizures, syncope, speech difficulty, weakness, numbness and headaches.  Hematological: Negative for adenopathy. Does not bruise/bleed easily.  Psychiatric/Behavioral:  Negative for suicidal ideas, behavioral problems, sleep disturbance, dysphoric mood and decreased concentration. The patient is not nervous/anxious.        Objective:   Physical Exam  Constitutional: She appears well-developed. No distress.  obese  HENT:  Head: Normocephalic.  Right Ear: External ear normal.  Left Ear: External ear normal.  Nose: Nose normal.  Mouth/Throat: Oropharynx is clear and moist.  Eyes: Conjunctivae are normal. Pupils are equal, round, and reactive to light. Right eye exhibits no discharge. Left eye exhibits no discharge.  Neck: Normal range of motion. Neck supple. No JVD present. No tracheal deviation present. No thyromegaly present.  Cardiovascular: Normal rate, regular rhythm and normal heart sounds.   Pulmonary/Chest: No stridor. No respiratory distress. She has no wheezes.  Abdominal: Soft. Bowel sounds are normal. She exhibits no distension and no mass. There is no tenderness. There is no rebound and no guarding.  Musculoskeletal: She exhibits no edema or tenderness.  LS is not tender B groin area NT Hips w/full ROM  Lymphadenopathy:    She has no cervical adenopathy.  Neurological: She displays normal reflexes. No cranial nerve deficit. She exhibits normal muscle tone. Coordination normal.  Skin: No rash noted. No erythema.  Psychiatric: She has a normal mood and affect. Her behavior is normal. Judgment and thought content normal.   Lab Results  Component Value Date   WBC 6.7 07/26/2013   HGB 13.2 07/26/2013   HCT 39.2 07/26/2013   PLT 278.0 07/26/2013   GLUCOSE 83 07/26/2013   CHOL 173 07/26/2013   TRIG 100.0 07/26/2013   HDL 67.80 07/26/2013   LDLDIRECT 160.7 02/06/2012   LDLCALC 85 07/26/2013   ALT 35 07/26/2013   AST 33 07/26/2013   NA 139 07/26/2013   K 3.9 07/26/2013  CL 103 07/26/2013   CREATININE 0.7 07/26/2013   BUN 14 07/26/2013   CO2 28 07/26/2013   TSH 0.61 07/26/2013          Assessment & Plan:  Patient ID: Jessica Lynn, female   DOB: [birth year], 54 y.o.   MRN: [id number]

## 2014-05-14 ENCOUNTER — Encounter: Payer: Self-pay | Admitting: Internal Medicine

## 2014-06-12 ENCOUNTER — Telehealth: Payer: Self-pay | Admitting: Internal Medicine

## 2014-06-12 DIAGNOSIS — G894 Chronic pain syndrome: Secondary | ICD-10-CM

## 2014-06-12 DIAGNOSIS — J309 Allergic rhinitis, unspecified: Secondary | ICD-10-CM

## 2014-06-12 DIAGNOSIS — E669 Obesity, unspecified: Secondary | ICD-10-CM

## 2014-06-12 DIAGNOSIS — E7800 Pure hypercholesterolemia, unspecified: Secondary | ICD-10-CM

## 2014-06-12 DIAGNOSIS — E559 Vitamin D deficiency, unspecified: Secondary | ICD-10-CM

## 2014-06-12 DIAGNOSIS — I10 Essential (primary) hypertension: Secondary | ICD-10-CM

## 2014-06-12 DIAGNOSIS — F4323 Adjustment disorder with mixed anxiety and depressed mood: Secondary | ICD-10-CM

## 2014-06-12 NOTE — Telephone Encounter (Signed)
Needs refill on Hydrocodone.  Please call when ready.

## 2014-06-12 NOTE — Telephone Encounter (Signed)
OK to fill this prescription with additional refills x0 Thank you!  

## 2014-06-13 MED ORDER — HYDROCODONE-ACETAMINOPHEN 5-325 MG PO TABS: 1.0000 | ORAL_TABLET | Freq: Three times a day (TID) | ORAL | Status: DC | PRN

## 2014-06-13 NOTE — Telephone Encounter (Signed)
Pt called to check up on this request. Please call her back [phone]

## 2014-06-13 NOTE — Telephone Encounter (Signed)
Tried calling pt back no answer can't leave msg due to vm full...Jessica Lynn

## 2014-06-13 NOTE — Telephone Encounter (Signed)
Called pt- not able to leave vm.

## 2014-06-14 NOTE — Telephone Encounter (Signed)
Tried calling pt [facility] rx ready for pick-up...[name]

## 2014-07-13 ENCOUNTER — Telehealth: Payer: Self-pay | Admitting: Internal Medicine

## 2014-07-13 DIAGNOSIS — F4323 Adjustment disorder with mixed anxiety and depressed mood: Secondary | ICD-10-CM

## 2014-07-13 DIAGNOSIS — G894 Chronic pain syndrome: Secondary | ICD-10-CM

## 2014-07-13 DIAGNOSIS — E559 Vitamin D deficiency, unspecified: Secondary | ICD-10-CM

## 2014-07-13 DIAGNOSIS — E7800 Pure hypercholesterolemia, unspecified: Secondary | ICD-10-CM

## 2014-07-13 DIAGNOSIS — J309 Allergic rhinitis, unspecified: Secondary | ICD-10-CM

## 2014-07-13 DIAGNOSIS — E669 Obesity, unspecified: Secondary | ICD-10-CM

## 2014-07-13 DIAGNOSIS — I10 Essential (primary) hypertension: Secondary | ICD-10-CM

## 2014-07-13 NOTE — Telephone Encounter (Signed)
Patient is requesting refill on hydrocodone  °

## 2014-07-13 NOTE — Telephone Encounter (Signed)
OK to fill this prescription with additional refills x0 Thank you!  

## 2014-07-14 MED ORDER — HYDROCODONE-ACETAMINOPHEN 5-325 MG PO TABS: 1.0000 | ORAL_TABLET | Freq: Three times a day (TID) | ORAL | Status: DC | PRN

## 2014-07-14 NOTE — Telephone Encounter (Signed)
Called pt no answer [facility] rx ready for pick-up.../lmb 

## 2014-08-10 ENCOUNTER — Ambulatory Visit (INDEPENDENT_AMBULATORY_CARE_PROVIDER_SITE_OTHER): Payer: BC Managed Care – PPO | Admitting: Internal Medicine

## 2014-08-10 ENCOUNTER — Encounter: Payer: Self-pay | Admitting: Internal Medicine

## 2014-08-10 VITALS — BP 140/90 | HR 72 | Wt 247.0 lb

## 2014-08-10 DIAGNOSIS — J309 Allergic rhinitis, unspecified: Secondary | ICD-10-CM | POA: Diagnosis not present

## 2014-08-10 DIAGNOSIS — E669 Obesity, unspecified: Secondary | ICD-10-CM

## 2014-08-10 DIAGNOSIS — I10 Essential (primary) hypertension: Secondary | ICD-10-CM

## 2014-08-10 DIAGNOSIS — E7800 Pure hypercholesterolemia, unspecified: Secondary | ICD-10-CM

## 2014-08-10 DIAGNOSIS — G894 Chronic pain syndrome: Secondary | ICD-10-CM

## 2014-08-10 DIAGNOSIS — M25562 Pain in left knee: Secondary | ICD-10-CM | POA: Insufficient documentation

## 2014-08-10 DIAGNOSIS — F4323 Adjustment disorder with mixed anxiety and depressed mood: Secondary | ICD-10-CM

## 2014-08-10 DIAGNOSIS — E559 Vitamin D deficiency, unspecified: Secondary | ICD-10-CM

## 2014-08-10 DIAGNOSIS — M25561 Pain in right knee: Secondary | ICD-10-CM | POA: Diagnosis not present

## 2014-08-10 MED ORDER — NAPROXEN 500 MG PO TABS: 500.0000 mg | ORAL_TABLET | Freq: Two times a day (BID) | ORAL | Status: DC

## 2014-08-10 MED ORDER — DULOXETINE HCL 30 MG PO CPEP: 30.0000 mg | ORAL_CAPSULE | Freq: Every day | ORAL | Status: DC

## 2014-08-10 MED ORDER — CLONAZEPAM 1 MG PO TABS: ORAL_TABLET | ORAL | Status: DC

## 2014-08-10 MED ORDER — HYDROCODONE-ACETAMINOPHEN 5-325 MG PO TABS: 1.0000 | ORAL_TABLET | Freq: Three times a day (TID) | ORAL | Status: DC | PRN

## 2014-08-10 MED ORDER — ZOLPIDEM TARTRATE 10 MG PO TABS: ORAL_TABLET | ORAL | Status: DC

## 2014-08-10 MED ORDER — VITAMIN D (ERGOCALCIFEROL) 1.25 MG (50000 UNIT) PO CAPS: ORAL_CAPSULE | ORAL | Status: DC

## 2014-08-10 NOTE — Assessment & Plan Note (Signed)
5/16 s/p fall x2 Ortho ref Dr Lynann Bologna

## 2014-08-10 NOTE — Progress Notes (Signed)
Pre visit review using our clinic review tool, if applicable. No additional management support is needed unless otherwise documented below in the visit note. 

## 2014-08-10 NOTE — Progress Notes (Signed)
Subjective:     HPI   C/o a fall at home on R knee, then fell at school on R knee - a lot of pain F/u "being mean", stressed out.... C/o insomnia. Not taking Cymbalta every day The patient presents for a follow-up of chronic LBP, hypertension, chronic dyslipidemia, anxiety F/u pain in joints, back, muscles all time. C/o B arm pain, hands hurt and numb - pain is 7/10 Stopped crestor a while ago - pain was not any worse... Re-checked BP 140/90  BP Readings from Last 3 Encounters:  08/10/14 180/120  05/12/14 166/100  02/08/14 120/82   Wt Readings from Last 3 Encounters:  08/10/14 247 lb (112.038 kg)  05/12/14 245 lb (111.131 kg)  02/08/14 240 lb (108.863 kg)    Review of Systems  Constitutional: Positive for fatigue and unexpected weight change. Negative for fever, chills, diaphoresis, activity change and appetite change.  HENT: Negative for congestion, ear pain, facial swelling, hearing loss, mouth sores, nosebleeds, postnasal drip, rhinorrhea, sinus pressure, sneezing, sore throat, tinnitus and trouble swallowing.   Eyes: Negative for pain, discharge, redness, itching and visual disturbance.  Respiratory: Negative for cough, chest tightness, shortness of breath, wheezing and stridor.   Cardiovascular: Negative for chest pain, palpitations and leg swelling.  Gastrointestinal: Negative for nausea, diarrhea, constipation, blood in stool, abdominal distention, anal bleeding and rectal pain.  Genitourinary: Negative for dysuria, urgency, frequency, hematuria, flank pain, vaginal bleeding, vaginal discharge, difficulty urinating, genital sores and pelvic pain.  Musculoskeletal: Positive for myalgias, back pain, arthralgias and neck pain. Negative for joint swelling, gait problem and neck stiffness.  Skin: Negative.  Negative for rash.  Neurological: Negative for dizziness, tremors, seizures, syncope, speech difficulty, weakness, numbness and headaches.  Hematological: Negative for  adenopathy. Does not bruise/bleed easily.  Psychiatric/Behavioral: Negative for suicidal ideas, behavioral problems, sleep disturbance, dysphoric mood and decreased concentration. The patient is not nervous/anxious.        Objective:   Physical Exam  Constitutional: She appears well-developed. No distress.  obese  HENT:  Head: Normocephalic.  Right Ear: External ear normal.  Left Ear: External ear normal.  Nose: Nose normal.  Mouth/Throat: Oropharynx is clear and moist.  Eyes: Conjunctivae are normal. Pupils are equal, round, and reactive to light. Right eye exhibits no discharge. Left eye exhibits no discharge.  Neck: Normal range of motion. Neck supple. No JVD present. No tracheal deviation present. No thyromegaly present.  Cardiovascular: Normal rate, regular rhythm and normal heart sounds.   Pulmonary/Chest: No stridor. No respiratory distress. She has no wheezes.  Abdominal: Soft. Bowel sounds are normal. She exhibits no distension and no mass. There is no tenderness. There is no rebound and no guarding.  Musculoskeletal: She exhibits no edema or tenderness.  LS is not tender B groin area NT Hips w/full ROM  Lymphadenopathy:    She has no cervical adenopathy.  Neurological: She displays normal reflexes. No cranial nerve deficit. She exhibits normal muscle tone. Coordination normal.  Skin: No rash noted. No erythema.  Psychiatric: She has a normal mood and affect. Her behavior is normal. Judgment and thought content normal.  R knee is tender w/ROM  Lab Results  Component Value Date   WBC 6.7 07/26/2013   HGB 13.2 07/26/2013   HCT 39.2 07/26/2013   PLT 278.0 07/26/2013   GLUCOSE 83 07/26/2013   CHOL 173 07/26/2013   TRIG 100.0 07/26/2013   HDL 67.80 07/26/2013   LDLDIRECT 160.7 02/06/2012   LDLCALC 85 07/26/2013  ALT 35 07/26/2013   AST 33 07/26/2013   NA 139 07/26/2013   K 3.9 07/26/2013   CL 103 07/26/2013   CREATININE 0.7 07/26/2013   BUN 14 07/26/2013   CO2  28 07/26/2013   TSH 0.61 07/26/2013          Assessment & Plan:

## 2014-08-17 ENCOUNTER — Ambulatory Visit: Payer: BC Managed Care – PPO | Admitting: Internal Medicine

## 2014-08-18 ENCOUNTER — Other Ambulatory Visit: Payer: Self-pay | Admitting: Obstetrics

## 2014-08-18 DIAGNOSIS — R921 Mammographic calcification found on diagnostic imaging of breast: Secondary | ICD-10-CM

## 2014-09-11 ENCOUNTER — Telehealth: Payer: Self-pay | Admitting: Internal Medicine

## 2014-09-11 DIAGNOSIS — G894 Chronic pain syndrome: Secondary | ICD-10-CM

## 2014-09-11 DIAGNOSIS — F4323 Adjustment disorder with mixed anxiety and depressed mood: Secondary | ICD-10-CM

## 2014-09-11 DIAGNOSIS — E7800 Pure hypercholesterolemia, unspecified: Secondary | ICD-10-CM

## 2014-09-11 DIAGNOSIS — J309 Allergic rhinitis, unspecified: Secondary | ICD-10-CM

## 2014-09-11 DIAGNOSIS — I10 Essential (primary) hypertension: Secondary | ICD-10-CM

## 2014-09-11 DIAGNOSIS — E559 Vitamin D deficiency, unspecified: Secondary | ICD-10-CM

## 2014-09-11 DIAGNOSIS — E669 Obesity, unspecified: Secondary | ICD-10-CM

## 2014-09-11 MED ORDER — HYDROCODONE-ACETAMINOPHEN 5-325 MG PO TABS: 1.0000 | ORAL_TABLET | Freq: Three times a day (TID) | ORAL | Status: DC | PRN

## 2014-09-11 NOTE — Telephone Encounter (Signed)
Patient is requesting a refill of HYDROcodone-acetaminophen ([place]) 5-325 MG per tablet [[id number]]

## 2014-09-11 NOTE — Telephone Encounter (Signed)
OK to fill this prescription with additional refills x0 Thank you!  

## 2014-09-11 NOTE — Telephone Encounter (Signed)
Pt informed Rx printed/upfront for p/u.

## 2014-10-11 ENCOUNTER — Other Ambulatory Visit: Payer: Self-pay

## 2014-10-11 ENCOUNTER — Telehealth: Payer: Self-pay | Admitting: Internal Medicine

## 2014-10-11 DIAGNOSIS — E669 Obesity, unspecified: Secondary | ICD-10-CM

## 2014-10-11 DIAGNOSIS — I10 Essential (primary) hypertension: Secondary | ICD-10-CM

## 2014-10-11 DIAGNOSIS — E559 Vitamin D deficiency, unspecified: Secondary | ICD-10-CM

## 2014-10-11 DIAGNOSIS — G894 Chronic pain syndrome: Secondary | ICD-10-CM

## 2014-10-11 DIAGNOSIS — E7800 Pure hypercholesterolemia, unspecified: Secondary | ICD-10-CM

## 2014-10-11 DIAGNOSIS — J309 Allergic rhinitis, unspecified: Secondary | ICD-10-CM

## 2014-10-11 DIAGNOSIS — F4323 Adjustment disorder with mixed anxiety and depressed mood: Secondary | ICD-10-CM

## 2014-10-11 MED ORDER — HYDROCODONE-ACETAMINOPHEN 5-325 MG PO TABS: 1.0000 | ORAL_TABLET | Freq: Three times a day (TID) | ORAL | Status: DC | PRN

## 2014-10-11 NOTE — Telephone Encounter (Signed)
Pt called in and is requesting refill on her HYDROcodone-acetaminophen (NORCO) 5-325 MG per tablet [[id number]]

## 2014-10-11 NOTE — Telephone Encounter (Signed)
Please advise, thanks.

## 2014-10-11 NOTE — Telephone Encounter (Signed)
Pt called and informed rx is ready for pick up in the office.

## 2014-10-11 NOTE — Telephone Encounter (Signed)
OK to fill this prescription with additional refills x0 OV q 3 mo Thank you!  

## 2014-11-10 ENCOUNTER — Ambulatory Visit: Payer: BC Managed Care – PPO | Admitting: Internal Medicine

## 2014-11-17 ENCOUNTER — Encounter: Payer: Self-pay | Admitting: Internal Medicine

## 2014-11-17 ENCOUNTER — Ambulatory Visit (INDEPENDENT_AMBULATORY_CARE_PROVIDER_SITE_OTHER): Payer: BC Managed Care – PPO | Admitting: Internal Medicine

## 2014-11-17 VITALS — BP 143/90 | HR 80 | Temp 98.1°F | Ht 66.0 in | Wt 242.0 lb

## 2014-11-17 DIAGNOSIS — E669 Obesity, unspecified: Secondary | ICD-10-CM | POA: Diagnosis not present

## 2014-11-17 DIAGNOSIS — G5602 Carpal tunnel syndrome, left upper limb: Secondary | ICD-10-CM

## 2014-11-17 DIAGNOSIS — G5603 Carpal tunnel syndrome, bilateral upper limbs: Secondary | ICD-10-CM

## 2014-11-17 DIAGNOSIS — E7800 Pure hypercholesterolemia, unspecified: Secondary | ICD-10-CM

## 2014-11-17 DIAGNOSIS — G5600 Carpal tunnel syndrome, unspecified upper limb: Secondary | ICD-10-CM | POA: Insufficient documentation

## 2014-11-17 DIAGNOSIS — I10 Essential (primary) hypertension: Secondary | ICD-10-CM

## 2014-11-17 DIAGNOSIS — G894 Chronic pain syndrome: Secondary | ICD-10-CM

## 2014-11-17 DIAGNOSIS — J309 Allergic rhinitis, unspecified: Secondary | ICD-10-CM

## 2014-11-17 DIAGNOSIS — G5601 Carpal tunnel syndrome, right upper limb: Secondary | ICD-10-CM | POA: Diagnosis not present

## 2014-11-17 DIAGNOSIS — E559 Vitamin D deficiency, unspecified: Secondary | ICD-10-CM

## 2014-11-17 DIAGNOSIS — F4323 Adjustment disorder with mixed anxiety and depressed mood: Secondary | ICD-10-CM

## 2014-11-17 MED ORDER — HYDROCODONE-ACETAMINOPHEN 5-325 MG PO TABS: 1.0000 | ORAL_TABLET | Freq: Three times a day (TID) | ORAL | Status: DC | PRN

## 2014-11-17 MED ORDER — ROSUVASTATIN CALCIUM 20 MG PO TABS: 20.0000 mg | ORAL_TABLET | Freq: Every day | ORAL | Status: DC

## 2014-11-17 MED ORDER — VITAMIN D (ERGOCALCIFEROL) 1.25 MG (50000 UNIT) PO CAPS: 50000.0000 [IU] | ORAL_CAPSULE | ORAL | Status: DC

## 2014-11-17 MED ORDER — CLONAZEPAM 1 MG PO TABS: ORAL_TABLET | ORAL | Status: DC

## 2014-11-17 MED ORDER — ZOLPIDEM TARTRATE 10 MG PO TABS: ORAL_TABLET | ORAL | Status: DC

## 2014-11-17 NOTE — Assessment & Plan Note (Signed)
B braces

## 2014-11-17 NOTE — Progress Notes (Signed)
Pre visit review using our clinic review tool, if applicable. No additional management support is needed unless otherwise documented below in the visit note. 

## 2014-11-17 NOTE — Progress Notes (Signed)
Subjective:     HPI   C/o a fall at home on R knee, then fell again at her dtr's garage last Sun on R knee - a lot of pain; pt saw Dr Lynann Bologna yesterday. Can't take Percocet due to itching that Dr Lynann Bologna gave her... F/u "being mean", stressed out.... F/u insomnia. Not taking Cymbalta every day The patient presents for a follow-up of chronic LBP, hypertension, chronic dyslipidemia, anxiety F/u pain in joints, back, muscles all time- pain is 7/10 w/o meds C/o B haand numbness  BP Readings from Last 3 Encounters:  11/17/14 143/90  08/10/14 140/90  05/12/14 166/100   Wt Readings from Last 3 Encounters:  11/17/14 242 lb (109.77 kg)  08/10/14 247 lb (112.038 kg)  05/12/14 245 lb (111.131 kg)    Review of Systems  Constitutional: Positive for fatigue and unexpected weight change. Negative for fever, chills, diaphoresis, activity change and appetite change.  HENT: Negative for congestion, ear pain, facial swelling, hearing loss, mouth sores, nosebleeds, postnasal drip, rhinorrhea, sinus pressure, sneezing, sore throat, tinnitus and trouble swallowing.   Eyes: Negative for pain, discharge, redness, itching and visual disturbance.  Respiratory: Negative for cough, chest tightness, shortness of breath, wheezing and stridor.   Cardiovascular: Negative for chest pain, palpitations and leg swelling.  Gastrointestinal: Negative for nausea, diarrhea, constipation, blood in stool, abdominal distention, anal bleeding and rectal pain.  Genitourinary: Negative for dysuria, urgency, frequency, hematuria, flank pain, vaginal bleeding, vaginal discharge, difficulty urinating, genital sores and pelvic pain.  Musculoskeletal: Positive for myalgias, back pain, joint swelling, arthralgias and neck pain. Negative for gait problem and neck stiffness.  Skin: Negative.  Negative for rash.  Neurological: Negative for dizziness, tremors, seizures, syncope, speech difficulty, weakness, numbness and headaches.    Hematological: Negative for adenopathy. Does not bruise/bleed easily.  Psychiatric/Behavioral: Negative for suicidal ideas, behavioral problems, sleep disturbance, dysphoric mood and decreased concentration. The patient is not nervous/anxious.        Objective:   Physical Exam  Constitutional: She appears well-developed. No distress.  obese  HENT:  Head: Normocephalic.  Right Ear: External ear normal.  Left Ear: External ear normal.  Nose: Nose normal.  Mouth/Throat: Oropharynx is clear and moist.  Eyes: Conjunctivae are normal. Pupils are equal, round, and reactive to light. Right eye exhibits no discharge. Left eye exhibits no discharge.  Neck: Normal range of motion. Neck supple. No JVD present. No tracheal deviation present. No thyromegaly present.  Cardiovascular: Normal rate, regular rhythm and normal heart sounds.   Pulmonary/Chest: No stridor. No respiratory distress. She has no wheezes.  Abdominal: Soft. Bowel sounds are normal. She exhibits no distension and no mass. There is no tenderness. There is no rebound and no guarding.  Musculoskeletal: She exhibits no edema or tenderness.  LS is not tender B groin area NT Hips w/full ROM  Lymphadenopathy:    She has no cervical adenopathy.  Neurological: She displays normal reflexes. No cranial nerve deficit. She exhibits normal muscle tone. Coordination normal.  Skin: No rash noted. No erythema.  Psychiatric: She has a normal mood and affect. Her behavior is normal. Judgment and thought content normal.  R knee is tender w/ROM R shoulder is tender B CTS (+) signs  Lab Results  Component Value Date   WBC 6.7 07/26/2013   HGB 13.2 07/26/2013   HCT 39.2 07/26/2013   PLT 278.0 07/26/2013   GLUCOSE 83 07/26/2013   CHOL 173 07/26/2013   TRIG 100.0 07/26/2013   HDL 67.80  07/26/2013   LDLDIRECT 160.7 02/06/2012   LDLCALC 85 07/26/2013   ALT 35 07/26/2013   AST 33 07/26/2013   NA 139 07/26/2013   K 3.9 07/26/2013   CL 103  07/26/2013   CREATININE 0.7 07/26/2013   BUN 14 07/26/2013   CO2 28 07/26/2013   TSH 0.61 07/26/2013          Assessment & Plan:

## 2014-12-18 ENCOUNTER — Telehealth: Payer: Self-pay | Admitting: Internal Medicine

## 2014-12-18 DIAGNOSIS — J309 Allergic rhinitis, unspecified: Secondary | ICD-10-CM

## 2014-12-18 DIAGNOSIS — I10 Essential (primary) hypertension: Secondary | ICD-10-CM

## 2014-12-18 DIAGNOSIS — E559 Vitamin D deficiency, unspecified: Secondary | ICD-10-CM

## 2014-12-18 DIAGNOSIS — G894 Chronic pain syndrome: Secondary | ICD-10-CM

## 2014-12-18 DIAGNOSIS — E7800 Pure hypercholesterolemia, unspecified: Secondary | ICD-10-CM

## 2014-12-18 DIAGNOSIS — F4323 Adjustment disorder with mixed anxiety and depressed mood: Secondary | ICD-10-CM

## 2014-12-18 DIAGNOSIS — E669 Obesity, unspecified: Secondary | ICD-10-CM

## 2014-12-18 NOTE — Telephone Encounter (Signed)
OK to fill this prescription with additional refills x0 OV q 3 mo Thank you!  

## 2014-12-18 NOTE — Telephone Encounter (Signed)
Needs refill for Hydrocodone.  Will come by and pick up prescription today.

## 2014-12-20 MED ORDER — HYDROCODONE-ACETAMINOPHEN 5-325 MG PO TABS: 1.0000 | ORAL_TABLET | Freq: Three times a day (TID) | ORAL | Status: DC | PRN

## 2014-12-20 MED ORDER — HYDROXYZINE HCL 10 MG PO TABS: 10.0000 mg | ORAL_TABLET | Freq: Three times a day (TID) | ORAL | Status: DC | PRN

## 2014-12-20 NOTE — Telephone Encounter (Signed)
Notified pt rx ready for pick-up.../lmb 

## 2014-12-20 NOTE — Telephone Encounter (Signed)
Receive call checking status on refill for her hydrocodone. Also she states she need a refill on her hydroxzine. Per chart md has already approve on 12/18/14. Will print out for md to sign...Jessica Lynn

## 2015-01-22 ENCOUNTER — Telehealth: Payer: Self-pay | Admitting: *Deleted

## 2015-01-22 DIAGNOSIS — G894 Chronic pain syndrome: Secondary | ICD-10-CM

## 2015-01-22 DIAGNOSIS — E669 Obesity, unspecified: Secondary | ICD-10-CM

## 2015-01-22 DIAGNOSIS — I10 Essential (primary) hypertension: Secondary | ICD-10-CM

## 2015-01-22 DIAGNOSIS — J309 Allergic rhinitis, unspecified: Secondary | ICD-10-CM

## 2015-01-22 DIAGNOSIS — E7800 Pure hypercholesterolemia, unspecified: Secondary | ICD-10-CM

## 2015-01-22 DIAGNOSIS — E559 Vitamin D deficiency, unspecified: Secondary | ICD-10-CM

## 2015-01-22 DIAGNOSIS — F4323 Adjustment disorder with mixed anxiety and depressed mood: Secondary | ICD-10-CM

## 2015-01-22 MED ORDER — HYDROCODONE-ACETAMINOPHEN 5-325 MG PO TABS: 1.0000 | ORAL_TABLET | Freq: Three times a day (TID) | ORAL | Status: DC | PRN

## 2015-01-22 NOTE — Telephone Encounter (Signed)
Left msg on triage requesting refill on her hydrocodone. MD out of the office this week pls advise...[name]

## 2015-01-22 NOTE — Telephone Encounter (Signed)
Notified pt rx ready for pick-up.../lmb 

## 2015-01-22 NOTE — Telephone Encounter (Signed)
done

## 2015-02-06 ENCOUNTER — Telehealth: Payer: Self-pay | Admitting: Internal Medicine

## 2015-02-06 NOTE — Telephone Encounter (Signed)
Patient states that she needs a PA for zolpidem (AMBIEN) 10 MG tablet [[id number]]

## 2015-02-09 NOTE — Telephone Encounter (Signed)
PA initiated via Cover My Meds 

## 2015-02-16 NOTE — Telephone Encounter (Signed)
PA approved. Faxed cover to pharmacy with note to contact patient when ready to pick up.

## 2015-02-26 ENCOUNTER — Encounter: Payer: Self-pay | Admitting: Internal Medicine

## 2015-02-26 ENCOUNTER — Ambulatory Visit (INDEPENDENT_AMBULATORY_CARE_PROVIDER_SITE_OTHER): Payer: BC Managed Care – PPO | Admitting: Internal Medicine

## 2015-02-26 VITALS — BP 160/84 | HR 78 | Wt 252.0 lb

## 2015-02-26 DIAGNOSIS — Z23 Encounter for immunization: Secondary | ICD-10-CM | POA: Diagnosis not present

## 2015-02-26 DIAGNOSIS — J309 Allergic rhinitis, unspecified: Secondary | ICD-10-CM

## 2015-02-26 DIAGNOSIS — F4323 Adjustment disorder with mixed anxiety and depressed mood: Secondary | ICD-10-CM

## 2015-02-26 DIAGNOSIS — E559 Vitamin D deficiency, unspecified: Secondary | ICD-10-CM

## 2015-02-26 DIAGNOSIS — E7800 Pure hypercholesterolemia, unspecified: Secondary | ICD-10-CM

## 2015-02-26 DIAGNOSIS — G894 Chronic pain syndrome: Secondary | ICD-10-CM | POA: Diagnosis not present

## 2015-02-26 DIAGNOSIS — I10 Essential (primary) hypertension: Secondary | ICD-10-CM | POA: Diagnosis not present

## 2015-02-26 DIAGNOSIS — M25561 Pain in right knee: Secondary | ICD-10-CM

## 2015-02-26 DIAGNOSIS — E669 Obesity, unspecified: Secondary | ICD-10-CM

## 2015-02-26 MED ORDER — VITAMIN D (ERGOCALCIFEROL) 1.25 MG (50000 UNIT) PO CAPS: ORAL_CAPSULE | ORAL | Status: DC

## 2015-02-26 MED ORDER — CLONAZEPAM 1 MG PO TABS: ORAL_TABLET | ORAL | Status: DC

## 2015-02-26 MED ORDER — LISINOPRIL-HYDROCHLOROTHIAZIDE 20-12.5 MG PO TABS: 1.0000 | ORAL_TABLET | Freq: Every day | ORAL | Status: DC

## 2015-02-26 MED ORDER — HYDROCODONE-ACETAMINOPHEN 5-325 MG PO TABS: 1.0000 | ORAL_TABLET | Freq: Three times a day (TID) | ORAL | Status: DC | PRN

## 2015-02-26 MED ORDER — ZOLPIDEM TARTRATE 10 MG PO TABS: ORAL_TABLET | ORAL | Status: DC

## 2015-02-26 NOTE — Progress Notes (Signed)
Pre visit review using our clinic review tool, if applicable. No additional management support is needed unless otherwise documented below in the visit note. 

## 2015-02-26 NOTE — Assessment & Plan Note (Signed)
On Vit D 

## 2015-02-26 NOTE — Assessment & Plan Note (Signed)
Norco prn  Potential benefits of a long term opioids use as well as potential risks (i.e. addiction risk, apnea etc) and complications (i.e. Somnolence, constipation and others) were explained to the patient and were aknowledged. 

## 2015-02-26 NOTE — Progress Notes (Signed)
Subjective:  Patient ID: Jessica Lynn, female    DOB: 12-25-1960  Age: 54 y.o. MRN: [id number]  CC: No chief complaint on file.   HPI Jessica Lynn presents for R knee pain - pt had another injury at work; HTN, dyslipidemia f/u.  Outpatient Prescriptions Prior to Visit  Medication Sig Dispense Refill  . fluticasone (FLONASE) 50 MCG/ACT nasal spray 2 sprays in each nostril two times daily 16 g 6  . hydrOXYzine (ATARAX/VISTARIL) 10 MG tablet Take 1 tablet (10 mg total) by mouth 3 (three) times daily as needed. 30 tablet 2  . naproxen (NAPROSYN) 500 MG tablet Take 1 tablet (500 mg total) by mouth 2 (two) times daily with a meal. 60 tablet 3  . rosuvastatin (CRESTOR) 20 MG tablet Take 1 tablet (20 mg total) by mouth daily. 30 tablet 11  . clonazePAM (KLONOPIN) 1 MG tablet Take 1/2 to 1  Tablet by mouth two times daily as needed for nerves 60 tablet 2  . HYDROcodone-acetaminophen (NORCO) 5-325 MG tablet Take 1 tablet by mouth 3 (three) times daily as needed for severe pain. 90 tablet 0  . lisinopril-hydrochlorothiazide (PRINZIDE,ZESTORETIC) 10-12.5 MG per tablet Take 1 tablet by mouth daily. 30 tablet 11  . Vitamin D, Ergocalciferol, (DRISDOL) 50000 UNITS CAPS capsule TAKE 1 CAPSULE BY MOUTH EVERY 30 DAYS 3 capsule 5  . Vitamin D, Ergocalciferol, (DRISDOL) 50000 UNITS CAPS capsule Take 1 capsule (50,000 Units total) by mouth every 30 (thirty) days. 6 capsule 3  . zolpidem (AMBIEN) 10 MG tablet TAKE 1 TABLET BY MOUTH AT BEDTIME AS NEEDED FOR SLEEP 30 tablet 3  . DULoxetine (CYMBALTA) 30 MG capsule Take 1 capsule (30 mg total) by mouth at bedtime. (Patient not taking: Reported on 02/26/2015) 30 capsule 3   No facility-administered medications prior to visit.    ROS Review of Systems  Constitutional: Positive for unexpected weight change. Negative for chills, activity change, appetite change and fatigue.  HENT: Negative for congestion, mouth sores and sinus pressure.   Eyes: Negative for  visual disturbance.  Respiratory: Negative for cough and chest tightness.   Gastrointestinal: Negative for nausea and abdominal pain.  Genitourinary: Negative for frequency, difficulty urinating and vaginal pain.  Musculoskeletal: Negative for back pain and gait problem.  Skin: Negative for pallor and rash.  Neurological: Negative for dizziness, tremors, weakness, numbness and headaches.  Psychiatric/Behavioral: Negative for confusion and sleep disturbance.    Objective:  BP 160/84 mmHg  Pulse 78  Wt 252 lb (114.306 kg)  SpO2 97%  BP Readings from Last 3 Encounters:  02/26/15 160/84  11/17/14 143/90  08/10/14 140/90    Wt Readings from Last 3 Encounters:  02/26/15 252 lb (114.306 kg)  11/17/14 242 lb (109.77 kg)  08/10/14 247 lb (112.038 kg)    Physical Exam  Constitutional: She appears well-developed. No distress.  HENT:  Head: Normocephalic.  Right Ear: External ear normal.  Left Ear: External ear normal.  Nose: Nose normal.  Mouth/Throat: Oropharynx is clear and moist.  Eyes: Conjunctivae are normal. Pupils are equal, round, and reactive to light. Right eye exhibits no discharge. Left eye exhibits no discharge.  Neck: Normal range of motion. Neck supple. No JVD present. No tracheal deviation present. No thyromegaly present.  Cardiovascular: Normal rate, regular rhythm and normal heart sounds.   Pulmonary/Chest: No stridor. No respiratory distress. She has no wheezes.  Abdominal: Soft. Bowel sounds are normal. She exhibits no distension and no mass. There is no tenderness. There  is no rebound and no guarding.  Musculoskeletal: She exhibits no edema or tenderness.  Lymphadenopathy:    She has no cervical adenopathy.  Neurological: She displays normal reflexes. No cranial nerve deficit. She exhibits normal muscle tone. Coordination normal.  Skin: No rash noted. No erythema.  Psychiatric: She has a normal mood and affect. Her behavior is normal. Judgment and thought  content normal.  Obese R knee is tender  Lab Results  Component Value Date   WBC 6.7 07/26/2013   HGB 13.2 07/26/2013   HCT 39.2 07/26/2013   PLT 278.0 07/26/2013   GLUCOSE 83 07/26/2013   CHOL 173 07/26/2013   TRIG 100.0 07/26/2013   HDL 67.80 07/26/2013   LDLDIRECT 160.7 02/06/2012   LDLCALC 85 07/26/2013   ALT 35 07/26/2013   AST 33 07/26/2013   NA 139 07/26/2013   K 3.9 07/26/2013   CL 103 07/26/2013   CREATININE 0.7 07/26/2013   BUN 14 07/26/2013   CO2 28 07/26/2013   TSH 0.61 07/26/2013    Mm Digital Diagnostic Unilat L  04/03/2014  CLINICAL DATA:  Six month followup for left breast calcifications EXAM: DIGITAL DIAGNOSTIC  LEFT MAMMOGRAM WITH CAD COMPARISON:  Prior exams ACR Breast Density Category a: The breast tissue is almost entirely fatty. FINDINGS: Small loosely grouped calcifications in the left breast are stable. There are no new calcifications. There are no masses or areas of distortion. Mammographic images were processed with CAD. IMPRESSION: Left breast calcifications stable from the most recent prior exam. These are most likely benign. Additional short-term follow-up recommended. RECOMMENDATION: Left breast diagnostic mammography with magnification views in 6 months. Patient will be due for her right breast screening study at that time. I have discussed the findings and recommendations with the patient. Results were also provided in writing at the conclusion of the visit. If applicable, a reminder letter will be sent to the patient regarding the next appointment. BI-RADS CATEGORY  3: Probably benign finding(s) - short interval follow-up suggested. Electronically Signed   By: Lajean Manes M.D.   On: 04/03/2014 16:15    Assessment & Plan:   Diagnoses and all orders for this visit:  HYPERCHOLESTEROLEMIA -     HYDROcodone-acetaminophen (NORCO) 5-325 MG tablet; Take 1 tablet by mouth 3 (three) times daily as needed for severe pain.  Allergic rhinitis, unspecified  allergic rhinitis type -     HYDROcodone-acetaminophen (NORCO) 5-325 MG tablet; Take 1 tablet by mouth 3 (three) times daily as needed for severe pain.  Chronic pain syndrome -     HYDROcodone-acetaminophen (NORCO) 5-325 MG tablet; Take 1 tablet by mouth 3 (three) times daily as needed for severe pain.  Obesity -     HYDROcodone-acetaminophen (NORCO) 5-325 MG tablet; Take 1 tablet by mouth 3 (three) times daily as needed for severe pain.  HYPERTENSION, MILD -     HYDROcodone-acetaminophen (NORCO) 5-325 MG tablet; Take 1 tablet by mouth 3 (three) times daily as needed for severe pain.  Vitamin D deficiency -     HYDROcodone-acetaminophen (NORCO) 5-325 MG tablet; Take 1 tablet by mouth 3 (three) times daily as needed for severe pain.  Adjustment disorder with mixed anxiety and depressed mood -     HYDROcodone-acetaminophen (NORCO) 5-325 MG tablet; Take 1 tablet by mouth 3 (three) times daily as needed for severe pain.  Essential hypertension  Right knee pain  Need for influenza vaccination -     Flu Vaccine QUAD 36+ mos IM  Other orders -  Cancel: lisinopril-hydrochlorothiazide (PRINZIDE,ZESTORETIC) 10-12.5 MG tablet; Take 1 tablet by mouth daily. -     clonazePAM (KLONOPIN) 1 MG tablet; Take 1/2 to 1  Tablet by mouth two times daily as needed for nerves -     zolpidem (AMBIEN) 10 MG tablet; TAKE 1 TABLET BY MOUTH AT BEDTIME AS NEEDED FOR SLEEP -     lisinopril-hydrochlorothiazide (ZESTORETIC) 20-12.5 MG tablet; Take 1 tablet by mouth daily. -     Vitamin D, Ergocalciferol, (DRISDOL) 50000 UNITS CAPS capsule; TAKE 1 CAPSULE BY MOUTH EVERY 30 DAYS   I have discontinued Ms. Argenbright lisinopril-hydrochlorothiazide. I am also having her start on lisinopril-hydrochlorothiazide. Additionally, I am having her maintain her fluticasone, DULoxetine, naproxen, rosuvastatin, hydrOXYzine, clonazePAM, HYDROcodone-acetaminophen, zolpidem, and Vitamin D (Ergocalciferol).  Meds ordered this  encounter  Medications  . clonazePAM (KLONOPIN) 1 MG tablet    Sig: Take 1/2 to 1  Tablet by mouth two times daily as needed for nerves    Dispense:  60 tablet    Refill:  2  . HYDROcodone-acetaminophen (NORCO) 5-325 MG tablet    Sig: Take 1 tablet by mouth 3 (three) times daily as needed for severe pain.    Dispense:  90 tablet    Refill:  0  . zolpidem (AMBIEN) 10 MG tablet    Sig: TAKE 1 TABLET BY MOUTH AT BEDTIME AS NEEDED FOR SLEEP    Dispense:  30 tablet    Refill:  3  . lisinopril-hydrochlorothiazide (ZESTORETIC) 20-12.5 MG tablet    Sig: Take 1 tablet by mouth daily.    Dispense:  90 tablet    Refill:  3  . Vitamin D, Ergocalciferol, (DRISDOL) 50000 UNITS CAPS capsule    Sig: TAKE 1 CAPSULE BY MOUTH EVERY 30 DAYS    Dispense:  3 capsule    Refill:  5     Follow-up: Return in about 3 months (around 05/29/2015) for a follow-up visit.  Walker Kehr, MD

## 2015-02-26 NOTE — Assessment & Plan Note (Signed)
On Crestor 

## 2015-02-26 NOTE — Assessment & Plan Note (Signed)
Increase Prinizide dose

## 2015-03-02 ENCOUNTER — Ambulatory Visit: Payer: BC Managed Care – PPO | Admitting: Internal Medicine

## 2015-03-29 ENCOUNTER — Telehealth: Payer: Self-pay | Admitting: *Deleted

## 2015-03-29 DIAGNOSIS — F4323 Adjustment disorder with mixed anxiety and depressed mood: Secondary | ICD-10-CM

## 2015-03-29 DIAGNOSIS — J309 Allergic rhinitis, unspecified: Secondary | ICD-10-CM

## 2015-03-29 DIAGNOSIS — G894 Chronic pain syndrome: Secondary | ICD-10-CM

## 2015-03-29 DIAGNOSIS — E7800 Pure hypercholesterolemia, unspecified: Secondary | ICD-10-CM

## 2015-03-29 DIAGNOSIS — E559 Vitamin D deficiency, unspecified: Secondary | ICD-10-CM

## 2015-03-29 DIAGNOSIS — E669 Obesity, unspecified: Secondary | ICD-10-CM

## 2015-03-29 DIAGNOSIS — I10 Essential (primary) hypertension: Secondary | ICD-10-CM

## 2015-03-29 MED ORDER — HYDROCODONE-ACETAMINOPHEN 5-325 MG PO TABS: 1.0000 | ORAL_TABLET | Freq: Three times a day (TID) | ORAL | Status: DC | PRN

## 2015-03-29 NOTE — Telephone Encounter (Signed)
OK to fill this prescription with additional refills x0 Thank you!  

## 2015-03-29 NOTE — Telephone Encounter (Signed)
Received call pt is requesting refill on her Hydrocodone...Jessica Lynn

## 2015-03-29 NOTE — Telephone Encounter (Signed)
Notified pt rx ready for pick-up.../lmb 

## 2015-04-25 ENCOUNTER — Telehealth: Payer: Self-pay | Admitting: Internal Medicine

## 2015-04-25 NOTE — Telephone Encounter (Signed)
Patient called to advise that the pharmacy is going to be sending a PA for zolpidem (AMBIEN) 10 MG tablet [id number] . States that it would have come in last night.

## 2015-04-27 NOTE — Telephone Encounter (Signed)
PA initiated via Cover My Meds 

## 2015-04-27 NOTE — Telephone Encounter (Signed)
I never received a fax from pharmacy. I called pt and requested her ins info.   [facility] [address] in [place]. [employer]  Pt's ID# [id number]   [id number] ADV/RXGRP [id number] Group # [id number]

## 2015-04-30 ENCOUNTER — Telehealth: Payer: Self-pay

## 2015-04-30 DIAGNOSIS — E7800 Pure hypercholesterolemia, unspecified: Secondary | ICD-10-CM

## 2015-04-30 DIAGNOSIS — G894 Chronic pain syndrome: Secondary | ICD-10-CM

## 2015-04-30 DIAGNOSIS — I10 Essential (primary) hypertension: Secondary | ICD-10-CM

## 2015-04-30 DIAGNOSIS — E669 Obesity, unspecified: Secondary | ICD-10-CM

## 2015-04-30 DIAGNOSIS — F4323 Adjustment disorder with mixed anxiety and depressed mood: Secondary | ICD-10-CM

## 2015-04-30 DIAGNOSIS — E559 Vitamin D deficiency, unspecified: Secondary | ICD-10-CM

## 2015-04-30 DIAGNOSIS — J309 Allergic rhinitis, unspecified: Secondary | ICD-10-CM

## 2015-04-30 MED ORDER — HYDROCODONE-ACETAMINOPHEN 5-325 MG PO TABS: 1.0000 | ORAL_TABLET | Freq: Three times a day (TID) | ORAL | Status: DC | PRN

## 2015-04-30 NOTE — Telephone Encounter (Signed)
OK to fill this prescription with additional refills x0 OV q 3 mo Thank you!  

## 2015-04-30 NOTE — Telephone Encounter (Signed)
Pt rq rf of Hydrocodone.   Pls advise.

## 2015-04-30 NOTE — Telephone Encounter (Signed)
[facility] has approved the PA until 03/27/18.   Faxed information to pharmacy.

## 2015-04-30 NOTE — Telephone Encounter (Signed)
Pt informed rx is ready for pick up at the front.  

## 2015-04-30 NOTE — Telephone Encounter (Signed)
Pt informed

## 2015-05-31 ENCOUNTER — Encounter: Payer: Self-pay | Admitting: Internal Medicine

## 2015-05-31 ENCOUNTER — Ambulatory Visit (INDEPENDENT_AMBULATORY_CARE_PROVIDER_SITE_OTHER): Payer: BC Managed Care – PPO | Admitting: Internal Medicine

## 2015-05-31 VITALS — BP 138/90 | HR 80 | Wt 246.0 lb

## 2015-05-31 DIAGNOSIS — E7800 Pure hypercholesterolemia, unspecified: Secondary | ICD-10-CM

## 2015-05-31 DIAGNOSIS — E559 Vitamin D deficiency, unspecified: Secondary | ICD-10-CM

## 2015-05-31 DIAGNOSIS — E669 Obesity, unspecified: Secondary | ICD-10-CM

## 2015-05-31 DIAGNOSIS — G894 Chronic pain syndrome: Secondary | ICD-10-CM

## 2015-05-31 DIAGNOSIS — M25561 Pain in right knee: Secondary | ICD-10-CM

## 2015-05-31 DIAGNOSIS — M545 Low back pain, unspecified: Secondary | ICD-10-CM

## 2015-05-31 DIAGNOSIS — Z1211 Encounter for screening for malignant neoplasm of colon: Secondary | ICD-10-CM

## 2015-05-31 DIAGNOSIS — I10 Essential (primary) hypertension: Secondary | ICD-10-CM

## 2015-05-31 DIAGNOSIS — J309 Allergic rhinitis, unspecified: Secondary | ICD-10-CM | POA: Diagnosis not present

## 2015-05-31 DIAGNOSIS — F4323 Adjustment disorder with mixed anxiety and depressed mood: Secondary | ICD-10-CM

## 2015-05-31 MED ORDER — FLUTICASONE PROPIONATE 50 MCG/ACT NA SUSP: NASAL | Status: DC

## 2015-05-31 MED ORDER — HYDROCODONE-ACETAMINOPHEN 5-325 MG PO TABS: 1.0000 | ORAL_TABLET | Freq: Three times a day (TID) | ORAL | Status: DC | PRN

## 2015-05-31 MED ORDER — METHYLPREDNISOLONE ACETATE 80 MG/ML IJ SUSP
80.0000 mg | Freq: Once | INTRAMUSCULAR | Status: AC
  Administered 2015-05-31: 80 mg via INTRAMUSCULAR

## 2015-05-31 MED ORDER — LISINOPRIL-HYDROCHLOROTHIAZIDE 20-12.5 MG PO TABS: 1.0000 | ORAL_TABLET | Freq: Every day | ORAL | Status: DC

## 2015-05-31 MED ORDER — VITAMIN D (ERGOCALCIFEROL) 1.25 MG (50000 UNIT) PO CAPS: ORAL_CAPSULE | ORAL | Status: DC

## 2015-05-31 MED ORDER — HYDROXYZINE HCL 10 MG PO TABS: 10.0000 mg | ORAL_TABLET | Freq: Three times a day (TID) | ORAL | Status: DC | PRN

## 2015-05-31 MED ORDER — NAPROXEN 500 MG PO TABS: 500.0000 mg | ORAL_TABLET | Freq: Two times a day (BID) | ORAL | Status: DC

## 2015-05-31 MED ORDER — LORATADINE 10 MG PO TABS: 10.0000 mg | ORAL_TABLET | Freq: Every day | ORAL | Status: DC

## 2015-05-31 MED ORDER — ROSUVASTATIN CALCIUM 20 MG PO TABS: 20.0000 mg | ORAL_TABLET | Freq: Every day | ORAL | Status: DC

## 2015-05-31 MED ORDER — ZOLPIDEM TARTRATE 10 MG PO TABS: ORAL_TABLET | ORAL | Status: DC

## 2015-05-31 MED ORDER — DULOXETINE HCL 30 MG PO CPEP: 30.0000 mg | ORAL_CAPSULE | Freq: Every day | ORAL | Status: DC

## 2015-05-31 MED ORDER — CLONAZEPAM 1 MG PO TABS: ORAL_TABLET | ORAL | Status: DC

## 2015-05-31 NOTE — Assessment & Plan Note (Signed)
Chronic  Prinizide

## 2015-05-31 NOTE — Patient Instructions (Signed)
Patellar Tendinitis With Rehab  Tendinitis is inflammation of a tendon. Tendonitis of the tendon below the kneecap (patella) is known as patellar tendonitis. Patellar tendonitis is also called jumper's knee. Jumper's knee is a common cause of pain below the kneecap (infrapatellar). Jumper's knee may involve a tear (strain) in the ligament. Strains are classified into three categories. Grade 1 strains cause pain, but the tendon is not lengthened. Grade 2 strains include a lengthened ligament, due to the ligament being stretched or partially ruptured. With grade 2 strains there is still function, although function may be decreased. Grade 3 strains involve a complete tear of the tendon or muscle, and function is usually impaired. Patellar tendon strains are usually grade 1 or 2.   SYMPTOMS   · Pain, tenderness, swelling, warmth, or redness over the patellar tendon (just below the kneecap).  · Pain and loss of strength (sometimes), with forcefully straightening the knee (especially when jumping or rising from a seated or squatting position), or bending the knee completely (squatting or kneeling).  · Crackling sound (crepitation) when the tendon is moved or touched.  CAUSES   Patellar tendonitis is caused by injury to the patellar tendon. The inflammation is the body's healing response. Common causes of injury include:  · Stress from a sudden increase in intensity, frequency, or duration of training.  · Overuse of the thigh muscles (quadriceps) and patellar tendon.  · Direct hit (trauma) to the knee or patellar tendon.  RISK INCREASES WITH:  · Sports that require sudden, explosive quadriceps contraction, such as jumping, quick starts, or kicking.  · Running sports, especially running down hills.  · Poor strength and flexibility of the thigh and knee.  · Flat feet.  PREVENTION  · Warm up and stretch properly before activity.  · Allow for adequate recovery between workouts.  · Maintain physical fitness:    Strength,  flexibility, and endurance.    Cardiovascular fitness.  · Protect the knee joint with taping, protective strapping, bracing, or elastic compression bandage.  · Wear arch supports (orthotics).  PROGNOSIS   If treated properly, patellar tendonitis usually heals within 6 weeks.   RELATED COMPLICATIONS   · Longer healing time if not properly treated or if not given enough time to heal.  · Recurring symptoms if activity is resumed too soon, with overuse, with a direct blow, or when using poor technique.  · If untreated, tendon rupture requiring surgery.  TREATMENT  Treatment first involves the use of ice and medicine to reduce pain and inflammation. The use of strengthening and stretching exercises may help reduce pain with activity. These exercises may be performed at home or with a therapist. Serious cases of tendonitis may require restraining the knee for 10 to 14 days to prevent stress on the tendon and to promote healing. Crutches may be used (uncommon) until you can walk without a limp. For cases in which nonsurgical treatment is unsuccessful, surgery may be advised to remove the inflamed tendon lining (sheath). Surgery is rare, and is only advised after at least 6 months of nonsurgical treatment.  MEDICATION   · If pain medicine is needed, nonsteroidal anti-inflammatory medicines (aspirin and ibuprofen), or other minor pain relievers (acetaminophen), are often advised.  · Do not take pain medicine for 7 days before surgery.  · Prescription pain relievers may be given if your caregiver thinks they are needed. Use only as directed and only as much as you need.  HEAT AND COLD  · Cold treatment (icing)   should be applied for 10 to 15 minutes every 2 to 3 hours for inflammation and pain, and immediately after activity that aggravates your symptoms. Use ice packs or an ice massage.  · Heat treatment may be used before performing stretching and strengthening activities prescribed by your caregiver, physical therapist, or  athletic trainer. Use a heat pack or a warm water soak.  SEEK MEDICAL CARE IF:  · Symptoms get worse or do not improve in 2 weeks, despite treatment.  · New, unexplained symptoms develop. (Drugs used in treatment may produce side effects.)  EXERCISES  RANGE OF MOTION (ROM) AND STRETCHING EXERCISES - Patellar Tendinitis (Jumper's Knee)  These are some of the initial exercises with which you may start your rehabilitation program, until you see your caregiver again or until your symptoms are resolved. Remember:   · Flexible tissue is more tolerant of the stresses placed on it during activities.  · Each stretch should be held for 20 to 30 seconds.  · A gentle stretching sensation should be felt.  STRETCH - Hamstrings, Supine  · Lie on your back. Loop a belt or towel over the ball of your right / left foot.  · Straighten your right / left knee and slowly pull on the belt to raise your leg. Do not allow the right / left knee to bend. Keep your opposite leg flat on the floor.  · Raise the leg until you feel a gentle stretch behind your right / left knee or thigh. Hold this position for __________ seconds.  Repeat __________ times. Complete this stretch __________ times per day.   STRETCH - Hamstrings, Doorway  · Lie on your back with your right / left leg extended and resting on the wall, and the opposite leg flat on the ground through the door. At first, position your bottom farther away from the wall.  · Keep your right / left knee straight. If you feel a stretch behind your knee or thigh, hold this position for __________ seconds.  · If you do not feel a stretch, scoot your bottom closer to the door, and hold __________ seconds.  Repeat __________ times. Complete this stretch __________ times per day.   STRETCH - Hamstrings, Standing  · Stand or sit and extend your right / left leg, placing your foot on a chair or foot stool.  · Keep a slight arch in your low back and your hips straight forward.  · Lead with your chest  and lean forward at the waist until you feel a gentle stretch in the back of your right / left knee or thigh. (When done correctly, this exercise requires leaning only a small distance.)  · Hold this position for __________ seconds.  Repeat __________ times. Complete this stretch __________ times per day.  STRETCH - Adductors, Lunge  · While standing, spread your legs, with your right / left leg behind you.  · Lean away from your right / left leg by bending your opposite knee. You may rest your hands on your thigh for balance.  · You should feel a stretch in your right / left inner thigh. Hold for __________ seconds.  Repeat __________ times. Complete this exercise __________ times per day.   STRENGTHENING EXERCISES - Patellar Tendinitis (Jumper's Knee)  These exercises may help you when beginning to rehabilitate your injury. They may resolve your symptoms with or without further involvement from your physician, physical therapist or athletic trainer. While completing these exercises, remember:   · Muscles   can gain both the endurance and the strength needed for everyday activities through controlled exercises.  · Complete these exercises as instructed by your physician, physical therapist or athletic trainer. Increase the resistance and repetitions only as guided by your caregiver.  STRENGTH - Quadriceps, Isometrics  · Lie on your back with your right / left leg extended and your opposite knee bent.  · Gradually tense the muscles in the front of your right / left thigh. You should see either your kneecap slide up toward your hip or increased dimpling just above the knee. This motion will push the back of the knee down toward the floor, mat, or bed on which you are lying.  · Hold the muscle as tight as you can, without increasing your pain, for __________ seconds.  · Relax the muscles slowly and completely in between each repetition.  Repeat __________ times. Complete this exercise __________ times per day.      STRENGTH - Quadriceps, Short Arcs  · Lie on your back. Place a __________ inch towel roll under your right / left knee, so that the knee bends slightly.  · Raise only your lower leg by tightening the muscles in the front of your thigh. Do not allow your thigh to rise.  · Hold this position for __________ seconds.  Repeat __________ times. Complete this exercise __________ times per day.   OPTIONAL ANKLE WEIGHTS: Begin with ____________________, but DO NOT exceed ____________________. Increase in 1 pound/ 0.5 kilogram increments.  STRENGTH - Quadriceps, Straight Leg Raises   Quality counts! Watch for signs that the quadriceps muscle is working, to be sure you are strengthening the correct muscles and not "cheating" by substituting with healthier muscles.  · Lay on your back with your right / left leg extended and your opposite knee bent.  · Tense the muscles in the front of your right / left thigh. You should see either your kneecap slide up or increased dimpling just above the knee. Your thigh may even shake a bit.  · Tighten these muscles even more and raise your leg 4 to 6 inches off the floor. Hold for __________ seconds.  · Keeping these muscles tense, lower your leg.  · Relax the muscles slowly and completely between each repetition.  Repeat __________ times. Complete this exercise __________ times per day.   STRENGTH - Quadriceps, Squats  · Stand in a door frame so that your feet and knees are in line with the frame.  · Use your hands for balance, not support, on the frame.  · Slowly lower your weight, bending at the hips and knees. Keep your lower legs upright so that they are parallel with the door frame. Squat only within the range that does not increase your knee pain. Never let your hips drop below your knees.  · Slowly return upright, pushing with your legs, not pulling with your hands.  Repeat __________ times. Complete this exercise __________ times per day.   STRENGTH - Quadriceps,  Step-Downs  · Stand on the edge of a step stool or stair. Be prepared to use a countertop or wall for balance, if needed.  · Keeping your right / left knee directly over the middle of your foot, slowly touch your opposite heel to the floor or lower step. Do not go all the way to the floor if your knee pain increases; just go as far as you can without increased discomfort. Use your right / left leg muscles, not gravity to lower your

## 2015-05-31 NOTE — Progress Notes (Signed)
Subjective:  Patient ID: Jessica Lynn, female    DOB: Mar 28, 1961  Age: 55 y.o. MRN: [id number]  CC: No chief complaint on file.   HPI KRISTE IIAMS presents for depression, HTN, insomnia f/u. C/o R knee pain, locking and R knee cap going out of place sometimes...  Outpatient Prescriptions Prior to Visit  Medication Sig Dispense Refill  . clonazePAM (KLONOPIN) 1 MG tablet Take 1/2 to 1  Tablet by mouth two times daily as needed for nerves 60 tablet 2  . DULoxetine (CYMBALTA) 30 MG capsule Take 1 capsule (30 mg total) by mouth at bedtime. 30 capsule 3  . fluticasone (FLONASE) 50 MCG/ACT nasal spray 2 sprays in each nostril two times daily 16 g 6  . HYDROcodone-acetaminophen (NORCO) 5-325 MG tablet Take 1 tablet by mouth 3 (three) times daily as needed for severe pain. 90 tablet 0  . hydrOXYzine (ATARAX/VISTARIL) 10 MG tablet Take 1 tablet (10 mg total) by mouth 3 (three) times daily as needed. 30 tablet 2  . lisinopril-hydrochlorothiazide (ZESTORETIC) 20-12.5 MG tablet Take 1 tablet by mouth daily. 90 tablet 3  . naproxen (NAPROSYN) 500 MG tablet Take 1 tablet (500 mg total) by mouth 2 (two) times daily with a meal. 60 tablet 3  . rosuvastatin (CRESTOR) 20 MG tablet Take 1 tablet (20 mg total) by mouth daily. 30 tablet 11  . Vitamin D, Ergocalciferol, (DRISDOL) 50000 UNITS CAPS capsule TAKE 1 CAPSULE BY MOUTH EVERY 30 DAYS 3 capsule 5  . zolpidem (AMBIEN) 10 MG tablet TAKE 1 TABLET BY MOUTH AT BEDTIME AS NEEDED FOR SLEEP 30 tablet 3   No facility-administered medications prior to visit.    ROS Review of Systems  Constitutional: Negative for chills, activity change, appetite change, fatigue and unexpected weight change.  HENT: Positive for congestion, postnasal drip and rhinorrhea. Negative for mouth sores and sinus pressure.   Eyes: Positive for itching. Negative for visual disturbance.  Respiratory: Positive for cough. Negative for chest tightness.   Gastrointestinal: Negative for  nausea and abdominal pain.  Genitourinary: Negative for frequency, difficulty urinating and vaginal pain.  Musculoskeletal: Negative for back pain and gait problem.  Skin: Negative for pallor and rash.  Neurological: Negative for dizziness, tremors, weakness, numbness and headaches.  Psychiatric/Behavioral: Negative for confusion and sleep disturbance.    Objective:  BP 138/90 mmHg  Pulse 80  Wt 246 lb (111.585 kg)  SpO2 99%  BP Readings from Last 3 Encounters:  05/31/15 138/90  02/26/15 160/84  11/17/14 143/90    Wt Readings from Last 3 Encounters:  05/31/15 246 lb (111.585 kg)  02/26/15 252 lb (114.306 kg)  11/17/14 242 lb (109.77 kg)    Physical Exam  Constitutional: She appears well-developed. No distress.  HENT:  Head: Normocephalic.  Right Ear: External ear normal.  Left Ear: External ear normal.  Nose: Nose normal.  Mouth/Throat: Oropharynx is clear and moist.  Eyes: Conjunctivae are normal. Pupils are equal, round, and reactive to light. Right eye exhibits no discharge. Left eye exhibits no discharge.  Neck: Normal range of motion. Neck supple. No JVD present. No tracheal deviation present. No thyromegaly present.  Cardiovascular: Normal rate, regular rhythm and normal heart sounds.   Pulmonary/Chest: No stridor. No respiratory distress. She has no wheezes.  Abdominal: Soft. Bowel sounds are normal. She exhibits no distension and no mass. There is no tenderness. There is no rebound and no guarding.  Musculoskeletal: She exhibits tenderness. She exhibits no edema.  Lymphadenopathy:  She has no cervical adenopathy.  Neurological: She displays normal reflexes. No cranial nerve deficit. She exhibits normal muscle tone. Coordination normal.  Skin: No rash noted. No erythema.  Psychiatric: She has a normal mood and affect. Her behavior is normal. Judgment and thought content normal.  R kneecap - tender eryth nasal mucosa LS tender  Lab Results  Component Value  Date   WBC 6.7 07/26/2013   HGB 13.2 07/26/2013   HCT 39.2 07/26/2013   PLT 278.0 07/26/2013   GLUCOSE 83 07/26/2013   CHOL 173 07/26/2013   TRIG 100.0 07/26/2013   HDL 67.80 07/26/2013   LDLDIRECT 160.7 02/06/2012   LDLCALC 85 07/26/2013   ALT 35 07/26/2013   AST 33 07/26/2013   NA 139 07/26/2013   K 3.9 07/26/2013   CL 103 07/26/2013   CREATININE 0.7 07/26/2013   BUN 14 07/26/2013   CO2 28 07/26/2013   TSH 0.61 07/26/2013    Mm Digital Diagnostic Unilat L  04/03/2014  CLINICAL DATA:  Six month followup for left breast calcifications EXAM: DIGITAL DIAGNOSTIC  LEFT MAMMOGRAM WITH CAD COMPARISON:  Prior exams ACR Breast Density Category a: The breast tissue is almost entirely fatty. FINDINGS: Small loosely grouped calcifications in the left breast are stable. There are no new calcifications. There are no masses or areas of distortion. Mammographic images were processed with CAD. IMPRESSION: Left breast calcifications stable from the most recent prior exam. These are most likely benign. Additional short-term follow-up recommended. RECOMMENDATION: Left breast diagnostic mammography with magnification views in 6 months. Patient will be due for her right breast screening study at that time. I have discussed the findings and recommendations with the patient. Results were also provided in writing at the conclusion of the visit. If applicable, a reminder letter will be sent to the patient regarding the next appointment. BI-RADS CATEGORY  3: Probably benign finding(s) - short interval follow-up suggested. Electronically Signed   By: Lajean Manes M.D.   On: 04/03/2014 16:15    Assessment & Plan:   Diagnoses and all orders for this visit:  HYPERCHOLESTEROLEMIA -     HYDROcodone-acetaminophen (NORCO) 5-325 MG tablet; Take 1 tablet by mouth 3 (three) times daily as needed for severe pain.  Allergic rhinitis, unspecified allergic rhinitis type -     HYDROcodone-acetaminophen (NORCO) 5-325 MG  tablet; Take 1 tablet by mouth 3 (three) times daily as needed for severe pain.  Chronic pain syndrome -     HYDROcodone-acetaminophen (NORCO) 5-325 MG tablet; Take 1 tablet by mouth 3 (three) times daily as needed for severe pain.  Obesity -     HYDROcodone-acetaminophen (NORCO) 5-325 MG tablet; Take 1 tablet by mouth 3 (three) times daily as needed for severe pain.  HYPERTENSION, MILD -     HYDROcodone-acetaminophen (NORCO) 5-325 MG tablet; Take 1 tablet by mouth 3 (three) times daily as needed for severe pain.  Vitamin D deficiency -     HYDROcodone-acetaminophen (NORCO) 5-325 MG tablet; Take 1 tablet by mouth 3 (three) times daily as needed for severe pain.  Adjustment disorder with mixed anxiety and depressed mood -     HYDROcodone-acetaminophen (NORCO) 5-325 MG tablet; Take 1 tablet by mouth 3 (three) times daily as needed for severe pain.  Other orders -     lisinopril-hydrochlorothiazide (ZESTORETIC) 20-12.5 MG tablet; Take 1 tablet by mouth daily. -     zolpidem (AMBIEN) 10 MG tablet; TAKE 1 TABLET BY MOUTH AT BEDTIME AS NEEDED FOR SLEEP -  clonazePAM (KLONOPIN) 1 MG tablet; Take 1/2 to 1  Tablet by mouth two times daily as needed for nerves -     DULoxetine (CYMBALTA) 30 MG capsule; Take 1 capsule (30 mg total) by mouth at bedtime. -     hydrOXYzine (ATARAX/VISTARIL) 10 MG tablet; Take 1 tablet (10 mg total) by mouth 3 (three) times daily as needed. -     Vitamin D, Ergocalciferol, (DRISDOL) 50000 units CAPS capsule; TAKE 1 CAPSULE BY MOUTH EVERY 30 DAYS -     fluticasone (FLONASE) 50 MCG/ACT nasal spray; 2 sprays in each nostril two times daily   I am having Ms. Rakes maintain her fluticasone, DULoxetine, naproxen, rosuvastatin, hydrOXYzine, clonazePAM, zolpidem, lisinopril-hydrochlorothiazide, Vitamin D (Ergocalciferol), and HYDROcodone-acetaminophen.  No orders of the defined types were placed in this encounter.     Follow-up: No Follow-up on file.  Walker Kehr,  MD

## 2015-05-31 NOTE — Assessment & Plan Note (Signed)
Seasonal - worse Depo-medrol 80 mg im Flonase, Claritin

## 2015-05-31 NOTE — Assessment & Plan Note (Signed)
R patella sublaxation Norco prn  Potential benefits of a long term opioids use as well as potential risks (i.e. addiction risk, apnea etc) and complications (i.e. Somnolence, constipation and others) were explained to the patient and were aknowledged. Exercises given Naproxen prn Sports med ref offered

## 2015-05-31 NOTE — Assessment & Plan Note (Signed)
Chronic Norco prn  Potential benefits of a long term opioids use as well as potential risks (i.e. addiction risk, apnea etc) and complications (i.e. Somnolence, constipation and others) were explained to the patient and were aknowledged. 

## 2015-05-31 NOTE — Progress Notes (Signed)
Pre visit review using our clinic review tool, if applicable. No additional management support is needed unless otherwise documented below in the visit note. 

## 2015-05-31 NOTE — Addendum Note (Signed)
Addended by: Cresenciano Lick on: 05/31/2015 11:38 AM   Modules accepted: Orders

## 2015-06-25 ENCOUNTER — Ambulatory Visit (AMBULATORY_SURGERY_CENTER): Payer: Self-pay | Admitting: *Deleted

## 2015-06-25 VITALS — Ht 65.5 in | Wt 245.0 lb

## 2015-06-25 DIAGNOSIS — Z800 Family history of malignant neoplasm of digestive organs: Secondary | ICD-10-CM

## 2015-06-25 MED ORDER — NA SULFATE-K SULFATE-MG SULF 17.5-3.13-1.6 GM/177ML PO SOLN: 1.0000 | Freq: Once | ORAL | Status: DC

## 2015-06-25 NOTE — Progress Notes (Signed)
No egg or soy allergy known to patient - eggs make her nauseated  No issues with past sedation with any surgeries  or procedures, no intubation problems  No diet pills per patient No home 02 use per patient  No blood thinners per patient  Pt denies issues with constipation  emmi declined

## 2015-06-28 ENCOUNTER — Telehealth: Payer: Self-pay | Admitting: *Deleted

## 2015-06-28 DIAGNOSIS — J309 Allergic rhinitis, unspecified: Secondary | ICD-10-CM

## 2015-06-28 DIAGNOSIS — E559 Vitamin D deficiency, unspecified: Secondary | ICD-10-CM

## 2015-06-28 DIAGNOSIS — G894 Chronic pain syndrome: Secondary | ICD-10-CM

## 2015-06-28 DIAGNOSIS — E7800 Pure hypercholesterolemia, unspecified: Secondary | ICD-10-CM

## 2015-06-28 DIAGNOSIS — I10 Essential (primary) hypertension: Secondary | ICD-10-CM

## 2015-06-28 DIAGNOSIS — E669 Obesity, unspecified: Secondary | ICD-10-CM

## 2015-06-28 DIAGNOSIS — F4323 Adjustment disorder with mixed anxiety and depressed mood: Secondary | ICD-10-CM

## 2015-06-28 MED ORDER — HYDROCODONE-ACETAMINOPHEN 5-325 MG PO TABS: 1.0000 | ORAL_TABLET | Freq: Three times a day (TID) | ORAL | Status: DC | PRN

## 2015-06-28 NOTE — Telephone Encounter (Signed)
Notified pt rx ready for pick-up.../lmb 

## 2015-06-28 NOTE — Telephone Encounter (Signed)
Pt requesting refill on her Hydrocodone...[name]

## 2015-06-28 NOTE — Telephone Encounter (Signed)
OK to fill this prescription with additional refills x0 OV q 3 mo Thank you!  

## 2015-07-07 LAB — HM MAMMOGRAPHY: HM Mammogram: NORMAL (ref 0–4)

## 2015-07-09 ENCOUNTER — Encounter: Payer: BC Managed Care – PPO | Admitting: Internal Medicine

## 2015-07-16 ENCOUNTER — Encounter: Payer: Self-pay | Admitting: Internal Medicine

## 2015-07-16 ENCOUNTER — Ambulatory Visit (AMBULATORY_SURGERY_CENTER): Payer: BC Managed Care – PPO | Admitting: Internal Medicine

## 2015-07-16 VITALS — BP 154/103 | HR 80 | Temp 98.1°F | Resp 11 | Ht 65.0 in | Wt 245.0 lb

## 2015-07-16 DIAGNOSIS — D122 Benign neoplasm of ascending colon: Secondary | ICD-10-CM

## 2015-07-16 DIAGNOSIS — D123 Benign neoplasm of transverse colon: Secondary | ICD-10-CM | POA: Diagnosis not present

## 2015-07-16 DIAGNOSIS — Z1211 Encounter for screening for malignant neoplasm of colon: Secondary | ICD-10-CM

## 2015-07-16 DIAGNOSIS — Z800 Family history of malignant neoplasm of digestive organs: Secondary | ICD-10-CM

## 2015-07-16 DIAGNOSIS — D124 Benign neoplasm of descending colon: Secondary | ICD-10-CM

## 2015-07-16 DIAGNOSIS — D125 Benign neoplasm of sigmoid colon: Secondary | ICD-10-CM

## 2015-07-16 MED ORDER — SODIUM CHLORIDE 0.9 % IV SOLN: 500.0000 mL | INTRAVENOUS | Status: DC

## 2015-07-16 NOTE — Op Note (Signed)
[place] Patient Name: Jessica Lynn Procedure Date: 07/16/2015 3:25 PM MRN: [id number] Endoscopist: Docia Chuck. Henrene Pastor , MD Age: 55 Date of Birth: 08-Dec-1960 Gender: Female Procedure:                Colonoscopy with snare polypectomy -4 Indications:              Screening in patient at increased risk: Colorectal                            cancer in brother before age 64 Medicines:                Monitored Anesthesia Care Procedure:                Pre-Anesthesia Assessment:                           - Prior to the procedure, a History and Physical                            was performed, and patient medications and                            allergies were reviewed. The patient's tolerance of                            previous anesthesia was also reviewed. The risks                            and benefits of the procedure and the sedation                            options and risks were discussed with the patient.                            All questions were answered, and informed consent                            was obtained. Prior Anticoagulants: The patient has                            taken no previous anticoagulant or antiplatelet                            agents. ASA Grade Assessment: II - A patient with                            mild systemic disease. After reviewing the risks                            and benefits, the patient was deemed in                            satisfactory condition to undergo the procedure.  After obtaining informed consent, the colonoscope                            was passed under direct vision. Throughout the                            procedure, the patient's blood pressure, pulse, and                            oxygen saturations were monitored continuously. The                            Model CF-HQ190L [id number]) scope was introduced                            through the anus and advanced to the the  cecum,                            identified by appendiceal orifice and ileocecal                            valve. The colonoscopy was performed without                            difficulty. The patient tolerated the procedure                            well. The quality of the bowel preparation was                            excellent. The bowel preparation used was SUPREP.                            The ileocecal valve, appendiceal orifice, and                            rectum were photographed. Scope In: 3:34:06 PM Scope Out: 3:55:03 PM Scope Withdrawal Time: 0 hours 18 minutes 31 seconds  Total Procedure Duration: 0 hours 20 minutes 57 seconds  Findings:                 The digital rectal exam was normal.                           A 4 mm polyp was found in the sigmoid colon, 10 mm                            descending colon, 2 mm transverse colon polyp, and                            5 mm ascending colon polyp. The polyps were                            pedunculated. The polyps were removed with  a cold                            snare. Resection and retrieval were complete. Minor                            oozing from descending colon polyp was controlled                            with hot snare tip cautery.                           The exam was otherwise without abnormality on                            direct and retroflexion views. Complications:            No immediate complications. Estimated Blood Loss:     Estimated blood loss: none. Impression:               - Multiple colon polyps removed with a cold snare.                            Resected and retrieved.                           - The examination was otherwise normal on direct                            and retroflexion views. Recommendation:           - Patient has a contact number available for                            emergencies. The signs and symptoms of potential                            delayed  complications were discussed with the                            patient. Return to normal activities tomorrow.                            Written discharge instructions were provided to the                            patient.                           - Resume previous diet.                           - Continue present medications.                           - Await pathology results.                           -  Repeat colonoscopy in 3 years for surveillance                            friends his multiple polyps and family history). Docia Chuck. Henrene Pastor, MD 07/16/2015 4:07:48 PM This report has been signed electronically. CC Letter to:             [name], MD

## 2015-07-16 NOTE — Progress Notes (Signed)
Called to room to assist during endoscopic procedure.  Patient ID and intended procedure confirmed with present staff. Received instructions for my participation in the procedure from the performing physician.  

## 2015-07-16 NOTE — Progress Notes (Signed)
To recovery, report to [place], RN, VSS 

## 2015-07-16 NOTE — Patient Instructions (Signed)
YOU HAD AN ENDOSCOPIC PROCEDURE TODAY AT [facility]:   Refer to the procedure report that was given to you for any specific questions about what was found during the examination.  If the procedure report does not answer your questions, please call your gastroenterologist to clarify.  If you requested that your care partner not be given the details of your procedure findings, then the procedure report has been included in a sealed envelope for you to review at your convenience later.  YOU SHOULD EXPECT: Some feelings of bloating in the abdomen. Passage of more gas than usual.  Walking can help get rid of the air that was put into your GI tract during the procedure and reduce the bloating. If you had a lower endoscopy (such as a colonoscopy or flexible sigmoidoscopy) you may notice spotting of blood in your stool or on the toilet paper. If you underwent a bowel prep for your procedure, you may not have a normal bowel movement for a few days.  Please Note:  You might notice some irritation and congestion in your nose or some drainage.  This is from the oxygen used during your procedure.  There is no need for concern and it should clear up in a day or so.  SYMPTOMS TO REPORT IMMEDIATELY:   Following lower endoscopy (colonoscopy or flexible sigmoidoscopy):  Excessive amounts of blood in the stool  Significant tenderness or worsening of abdominal pains  Swelling of the abdomen that is new, acute  Fever of 100F or higher    For urgent or emergent issues, a gastroenterologist can be reached at any hour by calling [phone].   DIET: Your first meal following the procedure should be a small meal and then it is ok to progress to your normal diet. Heavy or fried foods are harder to digest and may make you feel nauseous or bloated.  Likewise, meals heavy in dairy and vegetables can increase bloating.  Drink plenty of fluids but you should avoid alcoholic beverages for 24  hours.  ACTIVITY:  You should plan to take it easy for the rest of today and you should NOT DRIVE or use heavy machinery until tomorrow (because of the sedation medicines used during the test).    FOLLOW UP: Our staff will call the number listed on your records the next business day following your procedure to check on you and address any questions or concerns that you may have regarding the information given to you following your procedure. If we do not reach you, we will leave a message.  However, if you are feeling well and you are not experiencing any problems, there is no need to return our call.  We will assume that you have returned to your regular daily activities without incident.  If any biopsies were taken you will be contacted by phone or by letter within the next 1-3 weeks.  Please call us at [phone] if you have not heard about the biopsies in 3 weeks.    SIGNATURES/CONFIDENTIALITY: You and/or your care partner have signed paperwork which will be entered into your electronic medical record.  These signatures attest to the fact that that the information above on your After Visit Summary has been reviewed and is understood.  Full responsibility of the confidentiality of this discharge information lies with you and/or your care-partner.   Information on polyps given to you today  Await pathology results

## 2015-07-17 ENCOUNTER — Ambulatory Visit
Admission: RE | Admit: 2015-07-17 | Discharge: 2015-07-17 | Disposition: A | Discharge status: Home / Self Care | Payer: BC Managed Care – PPO | Source: Ambulatory Visit | Attending: Obstetrics | Admitting: Obstetrics

## 2015-07-17 ENCOUNTER — Telehealth: Payer: Self-pay | Admitting: *Deleted

## 2015-07-17 DIAGNOSIS — R921 Mammographic calcification found on diagnostic imaging of breast: Secondary | ICD-10-CM

## 2015-07-17 NOTE — Telephone Encounter (Signed)
Message left

## 2015-07-24 ENCOUNTER — Encounter: Payer: Self-pay | Admitting: Internal Medicine

## 2015-07-30 ENCOUNTER — Other Ambulatory Visit: Payer: Self-pay | Admitting: *Deleted

## 2015-07-30 DIAGNOSIS — E7800 Pure hypercholesterolemia, unspecified: Secondary | ICD-10-CM

## 2015-07-30 DIAGNOSIS — G894 Chronic pain syndrome: Secondary | ICD-10-CM

## 2015-07-30 DIAGNOSIS — J309 Allergic rhinitis, unspecified: Secondary | ICD-10-CM

## 2015-07-30 DIAGNOSIS — E669 Obesity, unspecified: Secondary | ICD-10-CM

## 2015-07-30 DIAGNOSIS — I10 Essential (primary) hypertension: Secondary | ICD-10-CM

## 2015-07-30 DIAGNOSIS — E559 Vitamin D deficiency, unspecified: Secondary | ICD-10-CM

## 2015-07-30 DIAGNOSIS — F4323 Adjustment disorder with mixed anxiety and depressed mood: Secondary | ICD-10-CM

## 2015-07-30 NOTE — Telephone Encounter (Signed)
Pt left msg on triage requesting refill on her hydrocodone.../lmb 

## 2015-07-30 NOTE — Telephone Encounter (Signed)
Ok to Rf zolpidem 10 mg to place on hold? Last filled 07/29/15.

## 2015-07-31 ENCOUNTER — Telehealth: Payer: Self-pay | Admitting: *Deleted

## 2015-07-31 MED ORDER — ZOLPIDEM TARTRATE 10 MG PO TABS: ORAL_TABLET | ORAL | Status: DC

## 2015-07-31 MED ORDER — HYDROCODONE-ACETAMINOPHEN 5-325 MG PO TABS: 1.0000 | ORAL_TABLET | Freq: Three times a day (TID) | ORAL | Status: DC | PRN

## 2015-07-31 NOTE — Telephone Encounter (Signed)
Pt informed

## 2015-07-31 NOTE — Telephone Encounter (Signed)
Ok Thx 

## 2015-07-31 NOTE — Telephone Encounter (Signed)
Received call pt states she is needing her hydrocodone she has left two msg's...Jessica Lynn

## 2015-07-31 NOTE — Addendum Note (Signed)
Addended by: Cresenciano Lick on: 07/31/2015 09:50 AM   Modules accepted: Orders

## 2015-07-31 NOTE — Telephone Encounter (Signed)
Jessica Regal, MA at 07/30/2015 10:35 AM     Status: Signed       Expand All Collapse All   Pt left msg on triage requesting refill on her hydrocodone...[name]

## 2015-07-31 NOTE — Telephone Encounter (Signed)
Zolpidem phoned. Ok to fill Hydrocodone per PCP. Pt must keep ROV q 3 months.

## 2015-08-01 NOTE — Telephone Encounter (Signed)
Called pt to verify if she pick rx up yesterday she stated yes. No one documented that rx had been pick-up. Closing encounter...[name]

## 2015-08-14 ENCOUNTER — Telehealth: Payer: Self-pay | Admitting: Internal Medicine

## 2015-08-14 NOTE — Telephone Encounter (Signed)
Error

## 2015-08-16 LAB — PROCEDURE REPORT - SCANNED: Pap: NEGATIVE

## 2015-08-29 ENCOUNTER — Encounter: Payer: Self-pay | Admitting: Internal Medicine

## 2015-08-29 ENCOUNTER — Ambulatory Visit (INDEPENDENT_AMBULATORY_CARE_PROVIDER_SITE_OTHER): Payer: BC Managed Care – PPO | Admitting: Internal Medicine

## 2015-08-29 VITALS — BP 128/88 | HR 91 | Wt 256.0 lb

## 2015-08-29 DIAGNOSIS — F411 Generalized anxiety disorder: Secondary | ICD-10-CM

## 2015-08-29 DIAGNOSIS — J309 Allergic rhinitis, unspecified: Secondary | ICD-10-CM

## 2015-08-29 DIAGNOSIS — G894 Chronic pain syndrome: Secondary | ICD-10-CM

## 2015-08-29 DIAGNOSIS — M545 Low back pain, unspecified: Secondary | ICD-10-CM

## 2015-08-29 DIAGNOSIS — M159 Polyosteoarthritis, unspecified: Secondary | ICD-10-CM

## 2015-08-29 DIAGNOSIS — M25511 Pain in right shoulder: Secondary | ICD-10-CM | POA: Insufficient documentation

## 2015-08-29 DIAGNOSIS — E7800 Pure hypercholesterolemia, unspecified: Secondary | ICD-10-CM | POA: Diagnosis not present

## 2015-08-29 DIAGNOSIS — M8949 Other hypertrophic osteoarthropathy, multiple sites: Secondary | ICD-10-CM

## 2015-08-29 DIAGNOSIS — I10 Essential (primary) hypertension: Secondary | ICD-10-CM

## 2015-08-29 DIAGNOSIS — F4323 Adjustment disorder with mixed anxiety and depressed mood: Secondary | ICD-10-CM

## 2015-08-29 DIAGNOSIS — E559 Vitamin D deficiency, unspecified: Secondary | ICD-10-CM

## 2015-08-29 DIAGNOSIS — M150 Primary generalized (osteo)arthritis: Secondary | ICD-10-CM

## 2015-08-29 DIAGNOSIS — G5603 Carpal tunnel syndrome, bilateral upper limbs: Secondary | ICD-10-CM

## 2015-08-29 DIAGNOSIS — E669 Obesity, unspecified: Secondary | ICD-10-CM

## 2015-08-29 MED ORDER — ALPRAZOLAM 1 MG PO TABS: 0.5000 mg | ORAL_TABLET | Freq: Two times a day (BID) | ORAL | Status: DC | PRN

## 2015-08-29 MED ORDER — HYDROCODONE-ACETAMINOPHEN 5-325 MG PO TABS: 1.0000 | ORAL_TABLET | Freq: Three times a day (TID) | ORAL | Status: DC | PRN

## 2015-08-29 MED ORDER — NAPROXEN 500 MG PO TABS: 500.0000 mg | ORAL_TABLET | Freq: Two times a day (BID) | ORAL | Status: DC

## 2015-08-29 MED ORDER — DULOXETINE HCL 30 MG PO CPEP: 30.0000 mg | ORAL_CAPSULE | Freq: Every day | ORAL | Status: DC

## 2015-08-29 MED ORDER — METHYLPREDNISOLONE ACETATE 80 MG/ML IJ SUSP
80.0000 mg | Freq: Once | INTRAMUSCULAR | Status: AC
  Administered 2015-08-29: 80 mg via INTRA_ARTICULAR

## 2015-08-29 NOTE — Assessment & Plan Note (Addendum)
R subacr bursitis Will inject per pt's request

## 2015-08-29 NOTE — Assessment & Plan Note (Signed)
Xanax prn. D/c Clonazepam Not to take w/Norco  Potential benefits of a long term benzodiazepines  use as well as potential risks  and complications were explained to the patient and were aknowledged.

## 2015-08-29 NOTE — Assessment & Plan Note (Signed)
Chronic B knees, LBP Norco prn. Not to take w/Xanax  Potential benefits of a long term opioids use as well as potential risks (i.e. addiction risk, apnea etc) and complications (i.e. Somnolence, constipation and others) were explained to the patient and were aknowledged.

## 2015-08-29 NOTE — Progress Notes (Signed)
Subjective:  Patient ID: Jessica Lynn, female    DOB: 14-Jun-1960  Age: 55 y.o. MRN: [id number]  CC: No chief complaint on file.   HPI [name] presents for OA, LBP, Knee OA, depression f/u. F/u HTN, B CTS. Clonazepam is not helping...  Outpatient Prescriptions Prior to Visit  Medication Sig Dispense Refill  . clonazePAM (KLONOPIN) 1 MG tablet Take 1/2 to 1  Tablet by mouth two times daily as needed for nerves 60 tablet 2  . DULoxetine (CYMBALTA) 30 MG capsule Take 1 capsule (30 mg total) by mouth at bedtime. 30 capsule 3  . fluticasone (FLONASE) 50 MCG/ACT nasal spray 2 sprays in each nostril two times daily 16 g 6  . HYDROcodone-acetaminophen (NORCO) 5-325 MG tablet Take 1 tablet by mouth 3 (three) times daily as needed for severe pain. 90 tablet 0  . hydrOXYzine (ATARAX/VISTARIL) 10 MG tablet Take 1 tablet (10 mg total) by mouth 3 (three) times daily as needed. 30 tablet 2  . lisinopril-hydrochlorothiazide (ZESTORETIC) 20-12.5 MG tablet Take 1 tablet by mouth daily. 90 tablet 3  . loratadine (CLARITIN) 10 MG tablet Take 1 tablet (10 mg total) by mouth daily. 100 tablet 3  . naproxen (NAPROSYN) 500 MG tablet Take 1 tablet (500 mg total) by mouth 2 (two) times daily with a meal. 60 tablet 3  . rosuvastatin (CRESTOR) 20 MG tablet Take 1 tablet (20 mg total) by mouth daily. 30 tablet 11  . Vitamin D, Ergocalciferol, (DRISDOL) 50000 units CAPS capsule TAKE 1 CAPSULE BY MOUTH EVERY 30 DAYS 3 capsule 5  . zolpidem (AMBIEN) 10 MG tablet TAKE 1 TABLET BY MOUTH AT BEDTIME AS NEEDED FOR SLEEP 30 tablet 3   No facility-administered medications prior to visit.    ROS Review of Systems  Constitutional: Negative for chills, activity change, appetite change, fatigue and unexpected weight change.  HENT: Negative for congestion, mouth sores and sinus pressure.   Eyes: Negative for visual disturbance.  Respiratory: Negative for cough and chest tightness.   Gastrointestinal: Negative for  nausea and abdominal pain.  Genitourinary: Negative for frequency, difficulty urinating and vaginal pain.  Musculoskeletal: Positive for back pain, arthralgias and gait problem.  Skin: Negative for pallor and rash.  Neurological: Negative for dizziness, tremors, weakness, numbness and headaches.  Psychiatric/Behavioral: Negative for confusion and sleep disturbance. The patient is nervous/anxious.     Objective:  BP 128/88 mmHg  Pulse 91  Wt 256 lb (116.121 kg)  SpO2 99%  BP Readings from Last 3 Encounters:  08/29/15 128/88  07/16/15 154/103  05/31/15 138/90    Wt Readings from Last 3 Encounters:  08/29/15 256 lb (116.121 kg)  07/16/15 245 lb (111.131 kg)  06/25/15 245 lb (111.131 kg)    Physical Exam  Constitutional: She appears well-developed. No distress.  HENT:  Head: Normocephalic.  Right Ear: External ear normal.  Left Ear: External ear normal.  Nose: Nose normal.  Mouth/Throat: Oropharynx is clear and moist.  Eyes: Conjunctivae are normal. Pupils are equal, round, and reactive to light. Right eye exhibits no discharge. Left eye exhibits no discharge.  Neck: Normal range of motion. Neck supple. No JVD present. No tracheal deviation present. No thyromegaly present.  Cardiovascular: Normal rate, regular rhythm and normal heart sounds.   Pulmonary/Chest: No stridor. No respiratory distress. She has no wheezes.  Abdominal: Soft. Bowel sounds are normal. She exhibits no distension and no mass. There is no tenderness. There is no rebound and no guarding.  Musculoskeletal:  She exhibits tenderness. She exhibits no edema.  Lymphadenopathy:    She has no cervical adenopathy.  Neurological: She displays normal reflexes. No cranial nerve deficit. She exhibits normal muscle tone. Coordination normal.  Skin: No rash noted. No erythema.  Psychiatric: She has a normal mood and affect. Her behavior is normal. Judgment and thought content normal.  LS, B knees are tender B wrist  splints R subacr is tender    Procedure :Joint Injection, R  shoulder   Indication:  Subacromial bursitis with refractory  chronic pain.   Risks including unsuccessful procedure , bleeding, infection, bruising, skin atrophy, "steroid flare-up" and others were explained to the patient in detail as well as the benefits. Informed consent was obtained and signed.   Tthe patient was placed in a comfortable position. Lateral approach was used. Skin was prepped with Betadine and alcohol  and anesthetized with a cooling spray. Then, a 5 cc syringe with a 2 inch long 24-gauge needle was used for a joint injection.. The needle was advanced  Into the subacromial space.The bursa was injected with 3 mL of 2% lidocaine and 40 mg of Depo-Medrol .  Band-Aid was applied.   Tolerated well. Complications: None. Good pain relief following the procedure.   Postprocedure instructions :    A Band-Aid should be left on for 12 hours. Injection therapy is not a cure itself. It is used in conjunction with other modalities. You can use nonsteroidal anti-inflammatories like ibuprofen , hot and cold compresses. Rest is recommended in the next 24 hours. You need to report immediately  if fever, chills or any signs of infection develop.    Lab Results  Component Value Date   WBC 6.7 07/26/2013   HGB 13.2 07/26/2013   HCT 39.2 07/26/2013   PLT 278.0 07/26/2013   GLUCOSE 83 07/26/2013   CHOL 173 07/26/2013   TRIG 100.0 07/26/2013   HDL 67.80 07/26/2013   LDLDIRECT 160.7 02/06/2012   LDLCALC 85 07/26/2013   ALT 35 07/26/2013   AST 33 07/26/2013   NA 139 07/26/2013   K 3.9 07/26/2013   CL 103 07/26/2013   CREATININE 0.7 07/26/2013   BUN 14 07/26/2013   CO2 28 07/26/2013   TSH 0.61 07/26/2013    Mm Diag Breast Tomo Bilateral  07/17/2015  CLINICAL DATA:  Followup for left breast calcifications. These were initially evaluated in April 2015. No current complaints. EXAM: 2D DIGITAL DIAGNOSTIC BILATERAL MAMMOGRAM  WITH CAD AND ADJUNCT TOMO COMPARISON:  Previous exam(s). ACR Breast Density Category a: The breast tissue is almost entirely fatty. FINDINGS: The small groups of calcifications in the left breast are stable and have been stable for 2 years. There are no breast masses, areas of architectural distortion or new or suspicious calcifications. Mammographic images were processed with CAD. IMPRESSION: Benign left breast calcifications.  No evidence of malignancy. RECOMMENDATION: Screening mammogram in one year.(Code:SM-B-01Y) I have discussed the findings and recommendations with the patient. Results were also provided in writing at the conclusion of the visit. If applicable, a reminder letter will be sent to the patient regarding the next appointment. BI-RADS CATEGORY  2: Benign. Electronically Signed   By: Lajean Manes M.D.   On: 07/17/2015 14:18    Assessment & Plan:   Diagnoses and all orders for this visit:  HYPERCHOLESTEROLEMIA -     HYDROcodone-acetaminophen (NORCO) 5-325 MG tablet; Take 1 tablet by mouth 3 (three) times daily as needed for severe pain.  Allergic rhinitis, unspecified allergic rhinitis type -  HYDROcodone-acetaminophen (NORCO) 5-325 MG tablet; Take 1 tablet by mouth 3 (three) times daily as needed for severe pain.  Chronic pain syndrome -     HYDROcodone-acetaminophen (NORCO) 5-325 MG tablet; Take 1 tablet by mouth 3 (three) times daily as needed for severe pain.  Obesity -     HYDROcodone-acetaminophen (NORCO) 5-325 MG tablet; Take 1 tablet by mouth 3 (three) times daily as needed for severe pain.  HYPERTENSION, MILD -     HYDROcodone-acetaminophen (NORCO) 5-325 MG tablet; Take 1 tablet by mouth 3 (three) times daily as needed for severe pain.  Vitamin D deficiency -     HYDROcodone-acetaminophen (NORCO) 5-325 MG tablet; Take 1 tablet by mouth 3 (three) times daily as needed for severe pain.  Adjustment disorder with mixed anxiety and depressed mood -      HYDROcodone-acetaminophen (NORCO) 5-325 MG tablet; Take 1 tablet by mouth 3 (three) times daily as needed for severe pain.  Other orders -     clonazePAM (KLONOPIN) 1 MG tablet; Take 1/2 to 1  Tablet by mouth two times daily as needed for nerves -     DULoxetine (CYMBALTA) 30 MG capsule; Take 1 capsule (30 mg total) by mouth at bedtime. -     naproxen (NAPROSYN) 500 MG tablet; Take 1 tablet (500 mg total) by mouth 2 (two) times daily with a meal. -     hydrOXYzine (ATARAX/VISTARIL) 10 MG tablet; Take 1 tablet (10 mg total) by mouth 3 (three) times daily as needed.   I am having Ms. Iyer maintain her lisinopril-hydrochlorothiazide, clonazePAM, DULoxetine, hydrOXYzine, Vitamin D (Ergocalciferol), fluticasone, loratadine, naproxen, rosuvastatin, zolpidem, HYDROcodone-acetaminophen, and acetaminophen-codeine.  Meds ordered this encounter  Medications  . acetaminophen-codeine (TYLENOL #3) 300-30 MG tablet    Sig: as needed. Reported on 08/29/2015     Follow-up: No Follow-up on file.  Walker Kehr, MD

## 2015-08-29 NOTE — Assessment & Plan Note (Signed)
  On diet  

## 2015-08-29 NOTE — Assessment & Plan Note (Signed)
Prinizide 

## 2015-08-29 NOTE — Patient Instructions (Signed)
Postprocedure instructions :    A Band-Aid should be left on for 12 hours. Injection therapy is not a cure itself. It is used in conjunction with other modalities. You can use nonsteroidal anti-inflammatories like ibuprofen , hot and cold compresses. Rest is recommended in the next 24 hours. You need to report immediately  if fever, chills or any signs of infection develop. 

## 2015-08-29 NOTE — Progress Notes (Signed)
Pre visit review using our clinic review tool, if applicable. No additional management support is needed unless otherwise documented below in the visit note. 

## 2015-08-29 NOTE — Assessment & Plan Note (Signed)
Better w/splints

## 2015-10-01 ENCOUNTER — Telehealth: Payer: Self-pay | Admitting: *Deleted

## 2015-10-01 DIAGNOSIS — E559 Vitamin D deficiency, unspecified: Secondary | ICD-10-CM

## 2015-10-01 DIAGNOSIS — F4323 Adjustment disorder with mixed anxiety and depressed mood: Secondary | ICD-10-CM

## 2015-10-01 DIAGNOSIS — E7800 Pure hypercholesterolemia, unspecified: Secondary | ICD-10-CM

## 2015-10-01 DIAGNOSIS — I10 Essential (primary) hypertension: Secondary | ICD-10-CM

## 2015-10-01 DIAGNOSIS — J309 Allergic rhinitis, unspecified: Secondary | ICD-10-CM

## 2015-10-01 DIAGNOSIS — G894 Chronic pain syndrome: Secondary | ICD-10-CM

## 2015-10-01 DIAGNOSIS — E669 Obesity, unspecified: Secondary | ICD-10-CM

## 2015-10-01 MED ORDER — HYDROCODONE-ACETAMINOPHEN 5-325 MG PO TABS: 1.0000 | ORAL_TABLET | Freq: Three times a day (TID) | ORAL | Status: DC | PRN

## 2015-10-01 NOTE — Telephone Encounter (Signed)
Notified pt rx ready for pick-up.../lmb 

## 2015-10-01 NOTE — Telephone Encounter (Signed)
Pt left msg on triage requesting refill on her Hydrocodone.../lmb 

## 2015-10-01 NOTE — Telephone Encounter (Signed)
OK to fill this prescription with additional refills x0 Thank you!  

## 2015-10-29 ENCOUNTER — Encounter: Payer: Self-pay | Admitting: Internal Medicine

## 2015-10-29 ENCOUNTER — Other Ambulatory Visit (INDEPENDENT_AMBULATORY_CARE_PROVIDER_SITE_OTHER): Payer: BC Managed Care – PPO

## 2015-10-29 ENCOUNTER — Ambulatory Visit (INDEPENDENT_AMBULATORY_CARE_PROVIDER_SITE_OTHER): Payer: BC Managed Care – PPO | Admitting: Internal Medicine

## 2015-10-29 VITALS — BP 150/90 | HR 88 | Wt 249.0 lb

## 2015-10-29 DIAGNOSIS — G894 Chronic pain syndrome: Secondary | ICD-10-CM

## 2015-10-29 DIAGNOSIS — I10 Essential (primary) hypertension: Secondary | ICD-10-CM

## 2015-10-29 DIAGNOSIS — E7800 Pure hypercholesterolemia, unspecified: Secondary | ICD-10-CM | POA: Diagnosis not present

## 2015-10-29 DIAGNOSIS — E669 Obesity, unspecified: Secondary | ICD-10-CM

## 2015-10-29 DIAGNOSIS — M25511 Pain in right shoulder: Secondary | ICD-10-CM

## 2015-10-29 DIAGNOSIS — M8949 Other hypertrophic osteoarthropathy, multiple sites: Secondary | ICD-10-CM

## 2015-10-29 DIAGNOSIS — M159 Polyosteoarthritis, unspecified: Secondary | ICD-10-CM

## 2015-10-29 DIAGNOSIS — F4323 Adjustment disorder with mixed anxiety and depressed mood: Secondary | ICD-10-CM

## 2015-10-29 DIAGNOSIS — F411 Generalized anxiety disorder: Secondary | ICD-10-CM

## 2015-10-29 DIAGNOSIS — M150 Primary generalized (osteo)arthritis: Secondary | ICD-10-CM

## 2015-10-29 DIAGNOSIS — M25512 Pain in left shoulder: Secondary | ICD-10-CM

## 2015-10-29 DIAGNOSIS — J309 Allergic rhinitis, unspecified: Secondary | ICD-10-CM

## 2015-10-29 DIAGNOSIS — E559 Vitamin D deficiency, unspecified: Secondary | ICD-10-CM

## 2015-10-29 LAB — BASIC METABOLIC PANEL
BUN: 19 mg/dL (ref 6–23)
CO2: 31 meq/L (ref 19–32)
Calcium: 9.7 mg/dL (ref 8.4–10.5)
Chloride: 106 mEq/L (ref 96–112)
Creatinine, Ser: 0.67 mg/dL (ref 0.40–1.20)
GFR: 118.09 mL/min (ref >60.00)
GLUCOSE: 94 mg/dL (ref 70–99)
POTASSIUM: 3.7 meq/L (ref 3.5–5.1)
SODIUM: 143 meq/L (ref 135–145)

## 2015-10-29 MED ORDER — HYDROCODONE-ACETAMINOPHEN 5-325 MG PO TABS: 1.0000 | ORAL_TABLET | Freq: Three times a day (TID) | ORAL | 0 refills | Status: DC | PRN

## 2015-10-29 MED ORDER — ZOLPIDEM TARTRATE 10 MG PO TABS: ORAL_TABLET | ORAL | 3 refills | Status: DC

## 2015-10-29 MED ORDER — CLONAZEPAM 1 MG PO TABS: 1.0000 mg | ORAL_TABLET | Freq: Two times a day (BID) | ORAL | 3 refills | Status: DC

## 2015-10-29 NOTE — Progress Notes (Signed)
Pre visit review using our clinic review tool, if applicable. No additional management support is needed unless otherwise documented below in the visit note. 

## 2015-10-29 NOTE — Progress Notes (Signed)
Subjective:  Patient ID: Jessica Lynn, female    DOB: December 22, 1960  Age: 55 y.o. MRN: [id number]  CC: No chief complaint on file.   HPI Jessica Lynn presents for depression/ anxiety, insomnia, OA, HTN. Not taking BP meds  Outpatient Medications Prior to Visit  Medication Sig Dispense Refill  . DULoxetine (CYMBALTA) 30 MG capsule Take 1 capsule (30 mg total) by mouth at bedtime. 30 capsule 3  . fluticasone (FLONASE) 50 MCG/ACT nasal spray 2 sprays in each nostril two times daily 16 g 6  . HYDROcodone-acetaminophen (NORCO) 5-325 MG tablet Take 1 tablet by mouth 3 (three) times daily as needed for severe pain. 90 tablet 0  . hydrOXYzine (ATARAX/VISTARIL) 10 MG tablet Take 1 tablet (10 mg total) by mouth 3 (three) times daily as needed. 30 tablet 2  . lisinopril-hydrochlorothiazide (ZESTORETIC) 20-12.5 MG tablet Take 1 tablet by mouth daily. 90 tablet 3  . loratadine (CLARITIN) 10 MG tablet Take 1 tablet (10 mg total) by mouth daily. 100 tablet 3  . naproxen (NAPROSYN) 500 MG tablet Take 1 tablet (500 mg total) by mouth 2 (two) times daily with a meal. 60 tablet 3  . rosuvastatin (CRESTOR) 20 MG tablet Take 1 tablet (20 mg total) by mouth daily. 30 tablet 11  . zolpidem (AMBIEN) 10 MG tablet TAKE 1 TABLET BY MOUTH AT BEDTIME AS NEEDED FOR SLEEP 30 tablet 3  . ALPRAZolam (XANAX) 1 MG tablet Take 0.5-1 tablets (0.5-1 mg total) by mouth 2 (two) times daily as needed for anxiety or sleep. (Patient not taking: Reported on 10/29/2015) 60 tablet 2  . Vitamin D, Ergocalciferol, (DRISDOL) 50000 units CAPS capsule TAKE 1 CAPSULE BY MOUTH EVERY 30 DAYS (Patient not taking: Reported on 10/29/2015) 3 capsule 5   No facility-administered medications prior to visit.     ROS Review of Systems  Constitutional: Positive for fatigue. Negative for activity change, appetite change, chills and unexpected weight change.  HENT: Negative for congestion, mouth sores and sinus pressure.   Eyes: Negative for visual  disturbance.  Respiratory: Negative for cough and chest tightness.   Gastrointestinal: Negative for abdominal pain and nausea.  Genitourinary: Negative for difficulty urinating, frequency and vaginal pain.  Musculoskeletal: Positive for arthralgias, back pain and gait problem.  Skin: Negative for pallor and rash.  Neurological: Negative for dizziness, tremors, weakness, numbness and headaches.  Psychiatric/Behavioral: Positive for dysphoric mood and sleep disturbance. Negative for confusion. The patient is nervous/anxious.     Objective:  BP (!) 150/90   Pulse 88   Wt 249 lb (112.9 kg)   SpO2 99%   BMI 41.44 kg/m   BP Readings from Last 3 Encounters:  10/29/15 (!) 150/90  08/29/15 128/88  07/16/15 (!) 154/103    Wt Readings from Last 3 Encounters:  10/29/15 249 lb (112.9 kg)  08/29/15 256 lb (116.1 kg)  07/16/15 245 lb (111.1 kg)    Physical Exam  Constitutional: She appears well-developed. No distress.  HENT:  Head: Normocephalic.  Right Ear: External ear normal.  Left Ear: External ear normal.  Nose: Nose normal.  Mouth/Throat: Oropharynx is clear and moist.  Eyes: Conjunctivae are normal. Pupils are equal, round, and reactive to light. Right eye exhibits no discharge. Left eye exhibits no discharge.  Neck: Normal range of motion. Neck supple. No JVD present. No tracheal deviation present. No thyromegaly present.  Cardiovascular: Normal rate, regular rhythm and normal heart sounds.   Pulmonary/Chest: No stridor. No respiratory distress. She has no  wheezes.  Abdominal: Soft. Bowel sounds are normal. She exhibits no distension and no mass. There is no tenderness. There is no rebound and no guarding.  Musculoskeletal: She exhibits tenderness. She exhibits no edema.  Lymphadenopathy:    She has no cervical adenopathy.  Neurological: She displays normal reflexes. No cranial nerve deficit. She exhibits normal muscle tone.  Skin: No rash noted. No erythema.  Psychiatric:  She has a normal mood and affect. Her behavior is normal. Judgment and thought content normal.  Obese  Lab Results  Component Value Date   WBC 6.7 07/26/2013   HGB 13.2 07/26/2013   HCT 39.2 07/26/2013   PLT 278.0 07/26/2013   GLUCOSE 83 07/26/2013   CHOL 173 07/26/2013   TRIG 100.0 07/26/2013   HDL 67.80 07/26/2013   LDLDIRECT 160.7 02/06/2012   LDLCALC 85 07/26/2013   ALT 35 07/26/2013   AST 33 07/26/2013   NA 139 07/26/2013   K 3.9 07/26/2013   CL 103 07/26/2013   CREATININE 0.7 07/26/2013   BUN 14 07/26/2013   CO2 28 07/26/2013   TSH 0.61 07/26/2013    Mm Diag Breast Tomo Bilateral  Result Date: 07/17/2015 CLINICAL DATA:  Followup for left breast calcifications. These were initially evaluated in April 2015. No current complaints. EXAM: 2D DIGITAL DIAGNOSTIC BILATERAL MAMMOGRAM WITH CAD AND ADJUNCT TOMO COMPARISON:  Previous exam(s). ACR Breast Density Category a: The breast tissue is almost entirely fatty. FINDINGS: The small groups of calcifications in the left breast are stable and have been stable for 2 years. There are no breast masses, areas of architectural distortion or new or suspicious calcifications. Mammographic images were processed with CAD. IMPRESSION: Benign left breast calcifications.  No evidence of malignancy. RECOMMENDATION: Screening mammogram in one year.(Code:SM-B-01Y) I have discussed the findings and recommendations with the patient. Results were also provided in writing at the conclusion of the visit. If applicable, a reminder letter will be sent to the patient regarding the next appointment. BI-RADS CATEGORY  2: Benign. Electronically Signed   By: Lajean Manes M.D.   On: 07/17/2015 14:18    Assessment & Plan:   Diagnoses and all orders for this visit:  HYPERCHOLESTEROLEMIA -     HYDROcodone-acetaminophen (NORCO) 5-325 MG tablet; Take 1 tablet by mouth 3 (three) times daily as needed for severe pain.  Allergic rhinitis, unspecified allergic rhinitis  type -     HYDROcodone-acetaminophen (NORCO) 5-325 MG tablet; Take 1 tablet by mouth 3 (three) times daily as needed for severe pain.  Chronic pain syndrome -     HYDROcodone-acetaminophen (NORCO) 5-325 MG tablet; Take 1 tablet by mouth 3 (three) times daily as needed for severe pain.  Obesity -     HYDROcodone-acetaminophen (NORCO) 5-325 MG tablet; Take 1 tablet by mouth 3 (three) times daily as needed for severe pain.  HYPERTENSION, MILD -     HYDROcodone-acetaminophen (NORCO) 5-325 MG tablet; Take 1 tablet by mouth 3 (three) times daily as needed for severe pain.  Vitamin D deficiency -     HYDROcodone-acetaminophen (NORCO) 5-325 MG tablet; Take 1 tablet by mouth 3 (three) times daily as needed for severe pain.  Adjustment disorder with mixed anxiety and depressed mood -     HYDROcodone-acetaminophen (NORCO) 5-325 MG tablet; Take 1 tablet by mouth 3 (three) times daily as needed for severe pain.  Other orders -     clonazePAM (KLONOPIN) 1 MG tablet; Take 1 tablet (1 mg total) by mouth 2 (two) times daily. -  zolpidem (AMBIEN) 10 MG tablet; TAKE 1 TABLET BY MOUTH AT BEDTIME AS NEEDED FOR SLEEP   I have discontinued Ms. Dykeman Vitamin D (Ergocalciferol). I am also having her maintain her lisinopril-hydrochlorothiazide, hydrOXYzine, fluticasone, loratadine, rosuvastatin, zolpidem, DULoxetine, naproxen, ALPRAZolam, HYDROcodone-acetaminophen, and clonazePAM.  Meds ordered this encounter  Medications  . clonazePAM (KLONOPIN) 1 MG tablet    Sig: Take 1 tablet by mouth 2 (two) times daily.     Follow-up: No Follow-up on file.  Walker Kehr, MD

## 2015-10-29 NOTE — Assessment & Plan Note (Signed)
Re-start Prinizide Risks associated with treatment noncompliance were discussed. Compliance was encouraged.

## 2015-10-29 NOTE — Assessment & Plan Note (Signed)
Chronic Norco not to take w/Clonazepam, Zolpidem  Potential benefits of a long term opioids use as well as potential risks (i.e. addiction risk, apnea etc) and complications (i.e. Somnolence, constipation and others) were explained to the patient and were aknowledged.

## 2015-10-29 NOTE — Assessment & Plan Note (Signed)
Better after the shot

## 2015-10-31 ENCOUNTER — Telehealth: Payer: Self-pay | Admitting: *Deleted

## 2015-10-31 MED ORDER — NAPROXEN 500 MG PO TABS: 500.0000 mg | ORAL_TABLET | Freq: Two times a day (BID) | ORAL | 3 refills | Status: DC

## 2015-10-31 NOTE — Telephone Encounter (Signed)
Rec'd call pt states she saw Dr. Camila Li on Monday and he was going to send something in for inflammation, but the pharmacy never receive. Per ov he was going to send the naproxen. Inform pt will send to [facility]...[name]

## 2015-11-06 DIAGNOSIS — H35373 Puckering of macula, bilateral: Secondary | ICD-10-CM | POA: Insufficient documentation

## 2015-11-06 DIAGNOSIS — H2513 Age-related nuclear cataract, bilateral: Secondary | ICD-10-CM | POA: Insufficient documentation

## 2015-11-06 DIAGNOSIS — H30033 Focal chorioretinal inflammation, peripheral, bilateral: Secondary | ICD-10-CM | POA: Insufficient documentation

## 2015-11-06 DIAGNOSIS — H35063 Retinal vasculitis, bilateral: Secondary | ICD-10-CM | POA: Insufficient documentation

## 2015-11-29 ENCOUNTER — Other Ambulatory Visit: Payer: Self-pay

## 2015-11-29 DIAGNOSIS — E669 Obesity, unspecified: Secondary | ICD-10-CM

## 2015-11-29 DIAGNOSIS — G894 Chronic pain syndrome: Secondary | ICD-10-CM

## 2015-11-29 DIAGNOSIS — E7800 Pure hypercholesterolemia, unspecified: Secondary | ICD-10-CM

## 2015-11-29 DIAGNOSIS — J309 Allergic rhinitis, unspecified: Secondary | ICD-10-CM

## 2015-11-29 DIAGNOSIS — E559 Vitamin D deficiency, unspecified: Secondary | ICD-10-CM

## 2015-11-29 DIAGNOSIS — F4323 Adjustment disorder with mixed anxiety and depressed mood: Secondary | ICD-10-CM

## 2015-11-29 DIAGNOSIS — I10 Essential (primary) hypertension: Secondary | ICD-10-CM

## 2015-11-29 NOTE — Telephone Encounter (Signed)
Pt called requesting refill of Hydrocodone, please advise. Thanks!

## 2015-11-29 NOTE — Telephone Encounter (Signed)
OK to fill this prescription with additional refills x0 OV q 3 mo Thank you!  

## 2015-11-30 MED ORDER — HYDROCODONE-ACETAMINOPHEN 5-325 MG PO TABS: 1.0000 | ORAL_TABLET | Freq: Three times a day (TID) | ORAL | 0 refills | Status: DC | PRN

## 2015-11-30 NOTE — Telephone Encounter (Signed)
Pt informed, Rx in cabinet for pt pick up  

## 2015-11-30 NOTE — Telephone Encounter (Signed)
Please print, thanks!

## 2015-12-13 ENCOUNTER — Encounter: Payer: Self-pay | Admitting: Internal Medicine

## 2015-12-13 ENCOUNTER — Ambulatory Visit (INDEPENDENT_AMBULATORY_CARE_PROVIDER_SITE_OTHER): Payer: BC Managed Care – PPO | Admitting: Internal Medicine

## 2015-12-13 VITALS — BP 130/84 | HR 77 | Wt 258.0 lb

## 2015-12-13 DIAGNOSIS — J309 Allergic rhinitis, unspecified: Secondary | ICD-10-CM | POA: Diagnosis not present

## 2015-12-13 DIAGNOSIS — M2550 Pain in unspecified joint: Secondary | ICD-10-CM | POA: Diagnosis not present

## 2015-12-13 DIAGNOSIS — E559 Vitamin D deficiency, unspecified: Secondary | ICD-10-CM

## 2015-12-13 DIAGNOSIS — G894 Chronic pain syndrome: Secondary | ICD-10-CM | POA: Diagnosis not present

## 2015-12-13 DIAGNOSIS — E669 Obesity, unspecified: Secondary | ICD-10-CM

## 2015-12-13 DIAGNOSIS — M545 Low back pain, unspecified: Secondary | ICD-10-CM

## 2015-12-13 DIAGNOSIS — H35069 Retinal vasculitis, unspecified eye: Secondary | ICD-10-CM | POA: Insufficient documentation

## 2015-12-13 DIAGNOSIS — I10 Essential (primary) hypertension: Secondary | ICD-10-CM

## 2015-12-13 DIAGNOSIS — E7800 Pure hypercholesterolemia, unspecified: Secondary | ICD-10-CM | POA: Diagnosis not present

## 2015-12-13 DIAGNOSIS — H35063 Retinal vasculitis, bilateral: Secondary | ICD-10-CM

## 2015-12-13 DIAGNOSIS — F4323 Adjustment disorder with mixed anxiety and depressed mood: Secondary | ICD-10-CM

## 2015-12-13 DIAGNOSIS — F411 Generalized anxiety disorder: Secondary | ICD-10-CM

## 2015-12-13 MED ORDER — CLONAZEPAM 1 MG PO TABS: 1.0000 mg | ORAL_TABLET | Freq: Two times a day (BID) | ORAL | 2 refills | Status: DC

## 2015-12-13 MED ORDER — DULOXETINE HCL 30 MG PO CPEP: 30.0000 mg | ORAL_CAPSULE | Freq: Every day | ORAL | 5 refills | Status: DC

## 2015-12-13 MED ORDER — HYDROCODONE-ACETAMINOPHEN 5-325 MG PO TABS: 1.0000 | ORAL_TABLET | Freq: Three times a day (TID) | ORAL | 0 refills | Status: DC | PRN

## 2015-12-13 MED ORDER — VITAMIN D3 50 MCG (2000 UT) PO CAPS: 2000.0000 [IU] | ORAL_CAPSULE | Freq: Every day | ORAL | 3 refills | Status: DC

## 2015-12-13 NOTE — Assessment & Plan Note (Signed)
Norco prn. Not to take w/Clonazepam, Zolpiden  Potential benefits of a long term opioids use as well as potential risks (i.e. addiction risk, apnea etc) and complications (i.e. Somnolence, constipation and others) were explained to the patient and were aknowledged.  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 She was ANA (+) 1:640 nucleolar pattern; RF (-); other multiple tests were (-)  Rheum ref

## 2015-12-13 NOTE — Progress Notes (Signed)
Subjective:  Patient ID: Jessica Lynn, female    DOB: May 29, 1960  Age: 55 y.o. MRN: [id number]  CC: No chief complaint on file.   HPI Jessica Lynn presents for B CTS R>L, LBP, anxiety, LBP f/u. 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 She was ANA (+) 1:640 nucleolar pattern; RF (-); other multiple tests were (-). Tests reviewed.  Outpatient Medications Prior to Visit  Medication Sig Dispense Refill  . clonazePAM (KLONOPIN) 1 MG tablet Take 1 tablet (1 mg total) by mouth 2 (two) times daily. 30 tablet 3  . DULoxetine (CYMBALTA) 30 MG capsule Take 1 capsule (30 mg total) by mouth at bedtime. 30 capsule 3  . fluticasone (FLONASE) 50 MCG/ACT nasal spray 2 sprays in each nostril two times daily 16 g 6  . HYDROcodone-acetaminophen (NORCO) 5-325 MG tablet Take 1 tablet by mouth 3 (three) times daily as needed for severe pain. 90 tablet 0  . hydrOXYzine (ATARAX/VISTARIL) 10 MG tablet Take 1 tablet (10 mg total) by mouth 3 (three) times daily as needed. 30 tablet 2  . lisinopril-hydrochlorothiazide (ZESTORETIC) 20-12.5 MG tablet Take 1 tablet by mouth daily. 90 tablet 3  . loratadine (CLARITIN) 10 MG tablet Take 1 tablet (10 mg total) by mouth daily. 100 tablet 3  . naproxen (NAPROSYN) 500 MG tablet Take 1 tablet (500 mg total) by mouth 2 (two) times daily with a meal. 60 tablet 3  . rosuvastatin (CRESTOR) 20 MG tablet Take 1 tablet (20 mg total) by mouth daily. 30 tablet 11  . zolpidem (AMBIEN) 10 MG tablet TAKE 1 TABLET BY MOUTH AT BEDTIME AS NEEDED FOR SLEEP 30 tablet 3   No facility-administered medications prior to visit.     ROS Review of Systems  Constitutional: Positive for fatigue. Negative for activity change, appetite change, chills and unexpected weight change.  HENT: Positive for rhinorrhea. Negative for congestion, mouth sores and sinus pressure.   Eyes: Positive for itching. Negative for visual disturbance.    Respiratory: Negative for cough and chest tightness.   Gastrointestinal: Negative for abdominal pain and nausea.  Genitourinary: Negative for difficulty urinating, frequency and vaginal pain.  Musculoskeletal: Positive for arthralgias, back pain and myalgias. Negative for gait problem.  Skin: Negative for pallor and rash.  Neurological: Negative for dizziness, tremors, weakness, numbness and headaches.  Psychiatric/Behavioral: Negative for confusion and sleep disturbance. The patient is nervous/anxious.     Objective:  BP 130/84   Pulse 77   Wt 258 lb (117 kg)   SpO2 98%   BMI 42.93 kg/m   BP Readings from Last 3 Encounters:  12/13/15 130/84  10/29/15 (!) 150/90  08/29/15 128/88    Wt Readings from Last 3 Encounters:  12/13/15 258 lb (117 kg)  10/29/15 249 lb (112.9 kg)  08/29/15 256 lb (116.1 kg)    Physical Exam  Constitutional: She appears well-developed. No distress.  HENT:  Head: Normocephalic.  Right Ear: External ear normal.  Left Ear: External ear normal.  Nose: Nose normal.  Mouth/Throat: Oropharynx is clear and moist.  Eyes: Conjunctivae are normal. Pupils are equal, round, and reactive to light. Right eye exhibits no discharge. Left eye exhibits no discharge.  Neck: Normal range of motion. Neck supple. No JVD present. No tracheal deviation present. No thyromegaly present.  Cardiovascular: Normal rate, regular rhythm and normal heart sounds.   Pulmonary/Chest: No stridor. No respiratory distress. She has  no wheezes.  Abdominal: Soft. Bowel sounds are normal. She exhibits no distension and no mass. There is no tenderness. There is no rebound and no guarding.  Musculoskeletal: She exhibits no edema or tenderness.  Lymphadenopathy:    She has no cervical adenopathy.  Neurological: She displays normal reflexes. No cranial nerve deficit. She exhibits normal muscle tone. Coordination normal.  Skin: No rash noted. No erythema.  Psychiatric: She has a normal mood  and affect. Her behavior is normal. Judgment and thought content normal.    Lab Results  Component Value Date   WBC 6.7 07/26/2013   HGB 13.2 07/26/2013   HCT 39.2 07/26/2013   PLT 278.0 07/26/2013   GLUCOSE 94 10/29/2015   CHOL 173 07/26/2013   TRIG 100.0 07/26/2013   HDL 67.80 07/26/2013   LDLDIRECT 160.7 02/06/2012   LDLCALC 85 07/26/2013   ALT 35 07/26/2013   AST 33 07/26/2013   NA 143 10/29/2015   K 3.7 10/29/2015   CL 106 10/29/2015   CREATININE 0.67 10/29/2015   BUN 19 10/29/2015   CO2 31 10/29/2015   TSH 0.61 07/26/2013    Mm Diag Breast Tomo Bilateral  Result Date: 07/17/2015 CLINICAL DATA:  Followup for left breast calcifications. These were initially evaluated in April 2015. No current complaints. EXAM: 2D DIGITAL DIAGNOSTIC BILATERAL MAMMOGRAM WITH CAD AND ADJUNCT TOMO COMPARISON:  Previous exam(s). ACR Breast Density Category a: The breast tissue is almost entirely fatty. FINDINGS: The small groups of calcifications in the left breast are stable and have been stable for 2 years. There are no breast masses, areas of architectural distortion or new or suspicious calcifications. Mammographic images were processed with CAD. IMPRESSION: Benign left breast calcifications.  No evidence of malignancy. RECOMMENDATION: Screening mammogram in one year.(Code:SM-B-01Y) I have discussed the findings and recommendations with the patient. Results were also provided in writing at the conclusion of the visit. If applicable, a reminder letter will be sent to the patient regarding the next appointment. BI-RADS CATEGORY  2: Benign. Electronically Signed   By: Lajean Manes M.D.   On: 07/17/2015 14:18    Assessment & Plan:   Diagnoses and all orders for this visit:  HYPERCHOLESTEROLEMIA -     HYDROcodone-acetaminophen (NORCO) 5-325 MG tablet; Take 1 tablet by mouth 3 (three) times daily as needed for severe pain.  Allergic rhinitis, unspecified allergic rhinitis type -      HYDROcodone-acetaminophen (NORCO) 5-325 MG tablet; Take 1 tablet by mouth 3 (three) times daily as needed for severe pain.  Chronic pain syndrome -     HYDROcodone-acetaminophen (NORCO) 5-325 MG tablet; Take 1 tablet by mouth 3 (three) times daily as needed for severe pain.  Obesity -     HYDROcodone-acetaminophen (NORCO) 5-325 MG tablet; Take 1 tablet by mouth 3 (three) times daily as needed for severe pain.  HYPERTENSION, MILD -     HYDROcodone-acetaminophen (NORCO) 5-325 MG tablet; Take 1 tablet by mouth 3 (three) times daily as needed for severe pain.  Vitamin D deficiency -     HYDROcodone-acetaminophen (NORCO) 5-325 MG tablet; Take 1 tablet by mouth 3 (three) times daily as needed for severe pain.  Adjustment disorder with mixed anxiety and depressed mood -     HYDROcodone-acetaminophen (NORCO) 5-325 MG tablet; Take 1 tablet by mouth 3 (three) times daily as needed for severe pain.  Other orders -     clonazePAM (KLONOPIN) 1 MG tablet; Take 1 tablet (1 mg total) by mouth 2 (two) times daily.  I am having Ms. Baskett maintain her lisinopril-hydrochlorothiazide, hydrOXYzine, fluticasone, loratadine, rosuvastatin, DULoxetine, clonazePAM, zolpidem, naproxen, HYDROcodone-acetaminophen, folic acid, methotrexate, and prednisoLONE acetate.  Meds ordered this encounter  Medications  . folic acid (FOLVITE) 1 MG tablet    Sig: Take 1 mg by mouth daily.  . methotrexate (RHEUMATREX) 2.5 MG tablet    Sig: Take 10 mg by mouth once a week.  . prednisoLONE acetate (PRED FORTE) 1 % ophthalmic suspension    Sig: Place 1 drop into both eyes 4 (four) times daily.     Follow-up: No Follow-up on file.  Walker Kehr, MD

## 2015-12-13 NOTE — Assessment & Plan Note (Signed)
   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 She was ANA (+) 1:640 nucleolar pattern; RF (-); other multiple tests were (-)

## 2015-12-13 NOTE — Progress Notes (Signed)
Pre visit review using our clinic review tool, if applicable. No additional management support is needed unless otherwise documented below in the visit note. 

## 2015-12-13 NOTE — Assessment & Plan Note (Signed)
Norco prn. Not to take w/Clonazepam, Zolpiden  Potential benefits of a long term opioids use as well as potential risks (i.e. addiction risk, apnea etc) and complications (i.e. Somnolence, constipation and others) were explained to the patient and were aknowledged.  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 She was ANA (+) 1:640 nucleolar pattern; RF (-); other multiple tests were (-)  Rhem ref

## 2015-12-13 NOTE — Addendum Note (Signed)
Addended by: Cassandria Anger on: 12/13/2015 08:29 AM   Modules accepted: Orders

## 2015-12-13 NOTE — Assessment & Plan Note (Signed)
Worse Re-start Cymbalta Clonazepam prn

## 2015-12-13 NOTE — Assessment & Plan Note (Signed)
Vit D 

## 2016-01-01 DIAGNOSIS — Z79899 Other long term (current) drug therapy: Secondary | ICD-10-CM | POA: Insufficient documentation

## 2016-01-02 ENCOUNTER — Encounter: Payer: Self-pay | Admitting: *Deleted

## 2016-01-22 ENCOUNTER — Other Ambulatory Visit: Payer: Self-pay | Admitting: Internal Medicine

## 2016-01-22 ENCOUNTER — Telehealth: Payer: Self-pay | Admitting: *Deleted

## 2016-01-22 DIAGNOSIS — F4323 Adjustment disorder with mixed anxiety and depressed mood: Secondary | ICD-10-CM

## 2016-01-22 DIAGNOSIS — E7800 Pure hypercholesterolemia, unspecified: Secondary | ICD-10-CM

## 2016-01-22 DIAGNOSIS — I10 Essential (primary) hypertension: Secondary | ICD-10-CM

## 2016-01-22 DIAGNOSIS — G894 Chronic pain syndrome: Secondary | ICD-10-CM

## 2016-01-22 DIAGNOSIS — E559 Vitamin D deficiency, unspecified: Secondary | ICD-10-CM

## 2016-01-22 MED ORDER — HYDROCODONE-ACETAMINOPHEN 5-325 MG PO TABS: 1.0000 | ORAL_TABLET | Freq: Three times a day (TID) | ORAL | 0 refills | Status: DC | PRN

## 2016-01-22 NOTE — Telephone Encounter (Signed)
Rec'd call pt is requesting refill on her Hydrocodone. MD is out of office until 10/23 pls advise on refill...[name]

## 2016-01-22 NOTE — Telephone Encounter (Signed)
RX written 

## 2016-01-22 NOTE — Telephone Encounter (Signed)
Notified pt rx ready for pick-up.../lmb 

## 2016-02-07 ENCOUNTER — Ambulatory Visit: Payer: BC Managed Care – PPO | Admitting: Internal Medicine

## 2016-02-11 ENCOUNTER — Ambulatory Visit (INDEPENDENT_AMBULATORY_CARE_PROVIDER_SITE_OTHER): Payer: BC Managed Care – PPO | Admitting: Internal Medicine

## 2016-02-11 ENCOUNTER — Encounter: Payer: Self-pay | Admitting: Internal Medicine

## 2016-02-11 DIAGNOSIS — F411 Generalized anxiety disorder: Secondary | ICD-10-CM

## 2016-02-11 DIAGNOSIS — I10 Essential (primary) hypertension: Secondary | ICD-10-CM

## 2016-02-11 DIAGNOSIS — G894 Chronic pain syndrome: Secondary | ICD-10-CM | POA: Diagnosis not present

## 2016-02-11 DIAGNOSIS — IMO0001 Reserved for inherently not codable concepts without codable children: Secondary | ICD-10-CM

## 2016-02-11 DIAGNOSIS — E559 Vitamin D deficiency, unspecified: Secondary | ICD-10-CM

## 2016-02-11 DIAGNOSIS — E7800 Pure hypercholesterolemia, unspecified: Secondary | ICD-10-CM

## 2016-02-11 DIAGNOSIS — F4323 Adjustment disorder with mixed anxiety and depressed mood: Secondary | ICD-10-CM

## 2016-02-11 DIAGNOSIS — E669 Obesity, unspecified: Secondary | ICD-10-CM

## 2016-02-11 DIAGNOSIS — Z6841 Body Mass Index (BMI) 40.0 and over, adult: Secondary | ICD-10-CM

## 2016-02-11 MED ORDER — HYDROCODONE-ACETAMINOPHEN 5-325 MG PO TABS: 1.0000 | ORAL_TABLET | Freq: Three times a day (TID) | ORAL | 0 refills | Status: DC | PRN

## 2016-02-11 MED ORDER — ZOLPIDEM TARTRATE 10 MG PO TABS: ORAL_TABLET | ORAL | 3 refills | Status: DC

## 2016-02-11 MED ORDER — NAPROXEN 500 MG PO TABS: 500.0000 mg | ORAL_TABLET | Freq: Two times a day (BID) | ORAL | 3 refills | Status: DC

## 2016-02-11 MED ORDER — LISINOPRIL-HYDROCHLOROTHIAZIDE 20-12.5 MG PO TABS: 1.0000 | ORAL_TABLET | Freq: Every day | ORAL | 11 refills | Status: DC

## 2016-02-11 NOTE — Assessment & Plan Note (Signed)
Wt Readings from Last 3 Encounters:  02/11/16 261 lb (118.4 kg)  12/13/15 258 lb (117 kg)  10/29/15 249 lb (112.9 kg)

## 2016-02-11 NOTE — Progress Notes (Signed)
Subjective:  Patient ID: Jessica Lynn, female    DOB: 1961/01/03  Age: 55 y.o. MRN: [id number]  CC: No chief complaint on file.   HPI Jessica Lynn presents for eye vasculitis, FMS, knee OA, HTN f/u. The pt stopped her BP meds due to the pills that looked pwdery...  Outpatient Medications Prior to Visit  Medication Sig Dispense Refill  . Cholecalciferol (VITAMIN D3) 2000 units capsule Take 1 capsule (2,000 Units total) by mouth daily. 100 capsule 3  . clonazePAM (KLONOPIN) 1 MG tablet Take 1 tablet (1 mg total) by mouth 2 (two) times daily. 60 tablet 2  . DULoxetine (CYMBALTA) 30 MG capsule Take 1 capsule (30 mg total) by mouth at bedtime. 30 capsule 5  . fluticasone (FLONASE) 50 MCG/ACT nasal spray 2 sprays in each nostril two times daily 16 g 6  . folic acid (FOLVITE) 1 MG tablet Take 1 mg by mouth daily.    [name] HYDROcodone-acetaminophen (NORCO) 5-325 MG tablet Take 1 tablet by mouth 3 (three) times daily as needed for severe pain. 90 tablet 0  . hydrOXYzine (ATARAX/VISTARIL) 10 MG tablet Take 1 tablet (10 mg total) by mouth 3 (three) times daily as needed. 30 tablet 2  . lisinopril-hydrochlorothiazide (ZESTORETIC) 20-12.5 MG tablet Take 1 tablet by mouth daily. 90 tablet 3  . loratadine (CLARITIN) 10 MG tablet Take 1 tablet (10 mg total) by mouth daily. 100 tablet 3  . naproxen (NAPROSYN) 500 MG tablet Take 1 tablet (500 mg total) by mouth 2 (two) times daily with a meal. 60 tablet 3  . prednisoLONE acetate (PRED FORTE) 1 % ophthalmic suspension Place 1 drop into both eyes 4 (four) times daily.    . rosuvastatin (CRESTOR) 20 MG tablet Take 1 tablet (20 mg total) by mouth daily. 30 tablet 11  . zolpidem (AMBIEN) 10 MG tablet TAKE 1 TABLET BY MOUTH AT BEDTIME AS NEEDED FOR SLEEP 30 tablet 3   No facility-administered medications prior to visit.     ROS Review of Systems  Constitutional: Positive for unexpected weight change. Negative for activity change, appetite change, chills and  fatigue.  HENT: Negative for congestion, mouth sores and sinus pressure.   Eyes: Positive for visual disturbance.  Respiratory: Negative for cough and chest tightness.   Gastrointestinal: Negative for abdominal pain and nausea.  Genitourinary: Negative for difficulty urinating, frequency and vaginal pain.  Musculoskeletal: Positive for arthralgias, back pain, gait problem, myalgias, neck pain and neck stiffness.  Skin: Negative for pallor and rash.  Neurological: Negative for dizziness, tremors, weakness, numbness and headaches.  Psychiatric/Behavioral: Negative for confusion and sleep disturbance.    Objective:  BP 138/80   Pulse 96   Temp 98.6 F (37 C) (Oral)   Wt 261 lb (118.4 kg)   SpO2 98%   BMI 43.43 kg/m   BP Readings from Last 3 Encounters:  02/11/16 138/80  12/13/15 130/84  10/29/15 (!) 150/90    Wt Readings from Last 3 Encounters:  02/11/16 261 lb (118.4 kg)  12/13/15 258 lb (117 kg)  10/29/15 249 lb (112.9 kg)    Physical Exam  Constitutional: She appears well-developed. No distress.  HENT:  Head: Normocephalic.  Right Ear: External ear normal.  Left Ear: External ear normal.  Nose: Nose normal.  Mouth/Throat: Oropharynx is clear and moist.  Eyes: Conjunctivae are normal. Pupils are equal, round, and reactive to light. Right eye exhibits no discharge. Left eye exhibits no discharge.  Neck: Normal range of motion. Neck  supple. No JVD present. No tracheal deviation present. No thyromegaly present.  Cardiovascular: Normal rate, regular rhythm and normal heart sounds.   Pulmonary/Chest: No stridor. No respiratory distress. She has no wheezes.  Abdominal: Soft. Bowel sounds are normal. She exhibits no distension and no mass. There is no tenderness. There is no rebound and no guarding.  Musculoskeletal: She exhibits tenderness. She exhibits no edema.  Lymphadenopathy:    She has no cervical adenopathy.  Neurological: She displays normal reflexes. No cranial  nerve deficit. She exhibits normal muscle tone. Coordination abnormal.  Skin: No rash noted. No erythema.  Psychiatric: She has a normal mood and affect. Her behavior is normal. Judgment and thought content normal.  Obese  Lab Results  Component Value Date   WBC 6.7 07/26/2013   HGB 13.2 07/26/2013   HCT 39.2 07/26/2013   PLT 278.0 07/26/2013   GLUCOSE 94 10/29/2015   CHOL 173 07/26/2013   TRIG 100.0 07/26/2013   HDL 67.80 07/26/2013   LDLDIRECT 160.7 02/06/2012   LDLCALC 85 07/26/2013   ALT 35 07/26/2013   AST 33 07/26/2013   NA 143 10/29/2015   K 3.7 10/29/2015   CL 106 10/29/2015   CREATININE 0.67 10/29/2015   BUN 19 10/29/2015   CO2 31 10/29/2015   TSH 0.61 07/26/2013    Mm Diag Breast Tomo Bilateral  Result Date: 07/17/2015 CLINICAL DATA:  Followup for left breast calcifications. These were initially evaluated in April 2015. No current complaints. EXAM: 2D DIGITAL DIAGNOSTIC BILATERAL MAMMOGRAM WITH CAD AND ADJUNCT TOMO COMPARISON:  Previous exam(s). ACR Breast Density Category a: The breast tissue is almost entirely fatty. FINDINGS: The small groups of calcifications in the left breast are stable and have been stable for 2 years. There are no breast masses, areas of architectural distortion or new or suspicious calcifications. Mammographic images were processed with CAD. IMPRESSION: Benign left breast calcifications.  No evidence of malignancy. RECOMMENDATION: Screening mammogram in one year.(Code:SM-B-01Y) I have discussed the findings and recommendations with the patient. Results were also provided in writing at the conclusion of the visit. If applicable, a reminder letter will be sent to the patient regarding the next appointment. BI-RADS CATEGORY  2: Benign. Electronically Signed   By: Lajean Manes M.D.   On: 07/17/2015 14:18    Assessment & Plan:   Diagnoses and all orders for this visit:  HYPERCHOLESTEROLEMIA -     HYDROcodone-acetaminophen (NORCO) 5-325 MG tablet;  Take 1 tablet by mouth 3 (three) times daily as needed for severe pain.  Chronic pain syndrome -     HYDROcodone-acetaminophen (NORCO) 5-325 MG tablet; Take 1 tablet by mouth 3 (three) times daily as needed for severe pain.  HYPERTENSION, MILD -     HYDROcodone-acetaminophen (NORCO) 5-325 MG tablet; Take 1 tablet by mouth 3 (three) times daily as needed for severe pain.  Vitamin D deficiency -     HYDROcodone-acetaminophen (NORCO) 5-325 MG tablet; Take 1 tablet by mouth 3 (three) times daily as needed for severe pain.  Adjustment disorder with mixed anxiety and depressed mood -     HYDROcodone-acetaminophen (NORCO) 5-325 MG tablet; Take 1 tablet by mouth 3 (three) times daily as needed for severe pain.  Other orders -     zolpidem (AMBIEN) 10 MG tablet; TAKE 1 TABLET BY MOUTH AT BEDTIME AS NEEDED FOR SLEEP -     lisinopril-hydrochlorothiazide (ZESTORETIC) 20-12.5 MG tablet; Take 1 tablet by mouth daily. -     naproxen (NAPROSYN) 500 MG tablet;  Take 1 tablet (500 mg total) by mouth 2 (two) times daily with a meal.   I am having Jessica Lynn maintain her lisinopril-hydrochlorothiazide, hydrOXYzine, fluticasone, loratadine, rosuvastatin, zolpidem, naproxen, folic acid, prednisoLONE acetate, clonazePAM, DULoxetine, Vitamin D3, HYDROcodone-acetaminophen, and methotrexate.  Meds ordered this encounter  Medications  . methotrexate (RHEUMATREX) 2.5 MG tablet    Sig: Take 8 tablets by mouth once a week.     Follow-up: No Follow-up on file.  Walker Kehr, MD

## 2016-02-11 NOTE — Progress Notes (Signed)
Pre visit review using our clinic review tool, if applicable. No additional management support is needed unless otherwise documented below in the visit note. 

## 2016-02-11 NOTE — Assessment & Plan Note (Addendum)
Norco prn  Potential benefits of a long term opioids use as well as potential risks (i.e. addiction risk, apnea etc) and complications (i.e. Somnolence, constipation and others) were explained to the patient and were aknowledged.  2017: s/p Rhem eval by Dr Charlestine Night

## 2016-02-11 NOTE — Assessment & Plan Note (Signed)
Prinizide Risks associated with treatment noncompliance were discussed. Compliance was encouraged.

## 2016-03-18 ENCOUNTER — Telehealth: Payer: Self-pay | Admitting: *Deleted

## 2016-03-18 DIAGNOSIS — G894 Chronic pain syndrome: Secondary | ICD-10-CM

## 2016-03-18 DIAGNOSIS — H44113 Panuveitis, bilateral: Secondary | ICD-10-CM | POA: Insufficient documentation

## 2016-03-18 DIAGNOSIS — I10 Essential (primary) hypertension: Secondary | ICD-10-CM

## 2016-03-18 DIAGNOSIS — E7800 Pure hypercholesterolemia, unspecified: Secondary | ICD-10-CM

## 2016-03-18 DIAGNOSIS — F4323 Adjustment disorder with mixed anxiety and depressed mood: Secondary | ICD-10-CM

## 2016-03-18 DIAGNOSIS — H209 Unspecified iridocyclitis: Secondary | ICD-10-CM | POA: Insufficient documentation

## 2016-03-18 DIAGNOSIS — E559 Vitamin D deficiency, unspecified: Secondary | ICD-10-CM

## 2016-03-18 MED ORDER — HYDROCODONE-ACETAMINOPHEN 5-325 MG PO TABS: 1.0000 | ORAL_TABLET | Freq: Three times a day (TID) | ORAL | 0 refills | Status: DC | PRN

## 2016-03-18 NOTE — Telephone Encounter (Signed)
Notified pt rx ready for pick-up.../lmb 

## 2016-03-18 NOTE — Telephone Encounter (Signed)
OK to fill this prescription with additional refills x0 Thank you!  

## 2016-03-18 NOTE — Telephone Encounter (Signed)
Rec'd call pt requesting refill on Hydrocodone.../lmb 

## 2016-04-16 ENCOUNTER — Telehealth: Payer: Self-pay | Admitting: *Deleted

## 2016-04-16 DIAGNOSIS — I10 Essential (primary) hypertension: Secondary | ICD-10-CM

## 2016-04-16 DIAGNOSIS — E7800 Pure hypercholesterolemia, unspecified: Secondary | ICD-10-CM

## 2016-04-16 DIAGNOSIS — F4323 Adjustment disorder with mixed anxiety and depressed mood: Secondary | ICD-10-CM

## 2016-04-16 DIAGNOSIS — E559 Vitamin D deficiency, unspecified: Secondary | ICD-10-CM

## 2016-04-16 DIAGNOSIS — G894 Chronic pain syndrome: Secondary | ICD-10-CM

## 2016-04-16 NOTE — Telephone Encounter (Signed)
Rec'd call pt requesting refill on her hydrocodone.../lmb 

## 2016-04-16 NOTE — Telephone Encounter (Signed)
OK to fill this/these prescription(s) with additional refills x0 Needs to have an OV every 3 months Thank you!  

## 2016-04-17 MED ORDER — HYDROCODONE-ACETAMINOPHEN 5-325 MG PO TABS: 1.0000 | ORAL_TABLET | Freq: Three times a day (TID) | ORAL | 0 refills | Status: DC | PRN

## 2016-04-17 NOTE — Telephone Encounter (Signed)
Printed script, MD signed, notified pt rx ready for pick-up.../lmb 

## 2016-05-08 ENCOUNTER — Ambulatory Visit: Payer: BC Managed Care – PPO | Admitting: Internal Medicine

## 2016-05-14 ENCOUNTER — Ambulatory Visit (INDEPENDENT_AMBULATORY_CARE_PROVIDER_SITE_OTHER): Payer: BC Managed Care – PPO | Admitting: Internal Medicine

## 2016-05-14 ENCOUNTER — Encounter: Payer: Self-pay | Admitting: Internal Medicine

## 2016-05-14 DIAGNOSIS — F4323 Adjustment disorder with mixed anxiety and depressed mood: Secondary | ICD-10-CM

## 2016-05-14 DIAGNOSIS — H35063 Retinal vasculitis, bilateral: Secondary | ICD-10-CM

## 2016-05-14 DIAGNOSIS — E559 Vitamin D deficiency, unspecified: Secondary | ICD-10-CM

## 2016-05-14 DIAGNOSIS — M545 Low back pain, unspecified: Secondary | ICD-10-CM

## 2016-05-14 DIAGNOSIS — I10 Essential (primary) hypertension: Secondary | ICD-10-CM | POA: Diagnosis not present

## 2016-05-14 DIAGNOSIS — F411 Generalized anxiety disorder: Secondary | ICD-10-CM | POA: Diagnosis not present

## 2016-05-14 DIAGNOSIS — Z23 Encounter for immunization: Secondary | ICD-10-CM

## 2016-05-14 DIAGNOSIS — G894 Chronic pain syndrome: Secondary | ICD-10-CM

## 2016-05-14 DIAGNOSIS — E7800 Pure hypercholesterolemia, unspecified: Secondary | ICD-10-CM

## 2016-05-14 MED ORDER — VALACYCLOVIR HCL 1 G PO TABS
1000.0000 mg | ORAL_TABLET | Freq: Three times a day (TID) | ORAL | 0 refills | Status: DC
Start: 2016-05-14 — End: 2018-09-29

## 2016-05-14 MED ORDER — FLUOROMETHOLONE 0.1 % OP SUSP: 1.0000 [drp] | Freq: Four times a day (QID) | OPHTHALMIC | 0 refills | Status: DC

## 2016-05-14 MED ORDER — HYDROCODONE-ACETAMINOPHEN 5-325 MG PO TABS: 1.0000 | ORAL_TABLET | Freq: Three times a day (TID) | ORAL | 0 refills | Status: DC | PRN

## 2016-05-14 MED ORDER — DORZOLAMIDE HCL-TIMOLOL MAL 2-0.5 % OP SOLN: 1.0000 [drp] | Freq: Two times a day (BID) | OPHTHALMIC | 12 refills | Status: DC

## 2016-05-14 MED ORDER — CLONAZEPAM 1 MG PO TABS: 1.0000 mg | ORAL_TABLET | Freq: Two times a day (BID) | ORAL | 2 refills | Status: DC

## 2016-05-14 MED ORDER — ZOLPIDEM TARTRATE 10 MG PO TABS: ORAL_TABLET | ORAL | 3 refills | Status: DC

## 2016-05-14 NOTE — Assessment & Plan Note (Signed)
Norco prn  Potential benefits of a long term opioids use as well as potential risks (i.e. addiction risk, apnea etc) and complications (i.e. Somnolence, constipation and others) were explained to the patient and were aknowledged. 

## 2016-05-14 NOTE — Assessment & Plan Note (Signed)
Prinizide 

## 2016-05-14 NOTE — Progress Notes (Signed)
Subjective:  Patient ID: Jessica Lynn, female    DOB: 1960/10/21  Age: 56 y.o. MRN: [id number]  CC: No chief complaint on file.   HPI TESHAWNA WELT presents for LBP, arthritis, HTN, anxiety f/u  Outpatient Medications Prior to Visit  Medication Sig Dispense Refill  . Cholecalciferol (VITAMIN D3) 2000 units capsule Take 1 capsule (2,000 Units total) by mouth daily. 100 capsule 3  . clonazePAM (KLONOPIN) 1 MG tablet Take 1 tablet (1 mg total) by mouth 2 (two) times daily. 60 tablet 2  . DULoxetine (CYMBALTA) 30 MG capsule Take 1 capsule (30 mg total) by mouth at bedtime. 30 capsule 5  . fluticasone (FLONASE) 50 MCG/ACT nasal spray 2 sprays in each nostril two times daily 16 g 6  . folic acid (FOLVITE) 1 MG tablet Take 1 mg by mouth daily.    [name] HYDROcodone-acetaminophen (NORCO) 5-325 MG tablet Take 1 tablet by mouth 3 (three) times daily as needed for severe pain. 90 tablet 0  . hydrOXYzine (ATARAX/VISTARIL) 10 MG tablet Take 1 tablet (10 mg total) by mouth 3 (three) times daily as needed. 30 tablet 2  . lisinopril-hydrochlorothiazide (ZESTORETIC) 20-12.5 MG tablet Take 1 tablet by mouth daily. 30 tablet 11  . loratadine (CLARITIN) 10 MG tablet Take 1 tablet (10 mg total) by mouth daily. 100 tablet 3  . methotrexate (RHEUMATREX) 2.5 MG tablet Take 8 tablets by mouth once a week.    . naproxen (NAPROSYN) 500 MG tablet Take 1 tablet (500 mg total) by mouth 2 (two) times daily with a meal. 60 tablet 3  . prednisoLONE acetate (PRED FORTE) 1 % ophthalmic suspension Place 1 drop into both eyes 4 (four) times daily.    . rosuvastatin (CRESTOR) 20 MG tablet Take 1 tablet (20 mg total) by mouth daily. 30 tablet 11  . zolpidem (AMBIEN) 10 MG tablet TAKE 1 TABLET BY MOUTH AT BEDTIME AS NEEDED FOR SLEEP 30 tablet 3   No facility-administered medications prior to visit.     ROS Review of Systems  Constitutional: Positive for fatigue. Negative for activity change, appetite change, chills and  unexpected weight change.  HENT: Negative for congestion, mouth sores and sinus pressure.   Eyes: Positive for pain and redness. Negative for visual disturbance.  Respiratory: Negative for cough and chest tightness.   Gastrointestinal: Negative for abdominal pain and nausea.  Genitourinary: Negative for difficulty urinating, frequency and vaginal pain.  Musculoskeletal: Positive for arthralgias and back pain. Negative for gait problem.  Skin: Positive for rash. Negative for pallor.  Neurological: Negative for dizziness, tremors, weakness, numbness and headaches.  Psychiatric/Behavioral: Positive for sleep disturbance. Negative for confusion. The patient is nervous/anxious.     Objective:  BP 138/72   Pulse 91   Temp 99 F (37.2 C) (Oral)   Resp 20   Wt 256 lb (116.1 kg)   SpO2 98%   BMI 42.60 kg/m   BP Readings from Last 3 Encounters:  05/14/16 138/72  02/11/16 138/80  12/13/15 130/84    Wt Readings from Last 3 Encounters:  05/14/16 256 lb (116.1 kg)  02/11/16 261 lb (118.4 kg)  12/13/15 258 lb (117 kg)    Physical Exam  Constitutional: She appears well-developed. No distress.  HENT:  Head: Normocephalic.  Right Ear: External ear normal.  Left Ear: External ear normal.  Nose: Nose normal.  Mouth/Throat: Oropharynx is clear and moist.  Eyes: Conjunctivae are normal. Pupils are equal, round, and reactive to light. Right eye exhibits  no discharge. Left eye exhibits no discharge.  Neck: Normal range of motion. Neck supple. No JVD present. No tracheal deviation present. No thyromegaly present.  Cardiovascular: Normal rate, regular rhythm and normal heart sounds.   Pulmonary/Chest: No stridor. No respiratory distress. She has no wheezes.  Abdominal: Soft. Bowel sounds are normal. She exhibits no distension and no mass. There is no tenderness. There is no rebound and no guarding.  Musculoskeletal: She exhibits tenderness. She exhibits no edema.  Lymphadenopathy:    She has  no cervical adenopathy.  Neurological: She displays normal reflexes. No cranial nerve deficit. She exhibits normal muscle tone. Coordination normal.  Skin: Rash noted. No erythema.  Psychiatric: She has a normal mood and affect. Her behavior is normal. Judgment and thought content normal.   2 blisters on R shoulder  Lab Results  Component Value Date   WBC 6.7 07/26/2013   HGB 13.2 07/26/2013   HCT 39.2 07/26/2013   PLT 278.0 07/26/2013   GLUCOSE 94 10/29/2015   CHOL 173 07/26/2013   TRIG 100.0 07/26/2013   HDL 67.80 07/26/2013   LDLDIRECT 160.7 02/06/2012   LDLCALC 85 07/26/2013   ALT 35 07/26/2013   AST 33 07/26/2013   NA 143 10/29/2015   K 3.7 10/29/2015   CL 106 10/29/2015   CREATININE 0.67 10/29/2015   BUN 19 10/29/2015   CO2 31 10/29/2015   TSH 0.61 07/26/2013    Mm Diag Breast Tomo Bilateral  Result Date: 07/17/2015 CLINICAL DATA:  Followup for left breast calcifications. These were initially evaluated in April 2015. No current complaints. EXAM: 2D DIGITAL DIAGNOSTIC BILATERAL MAMMOGRAM WITH CAD AND ADJUNCT TOMO COMPARISON:  Previous exam(s). ACR Breast Density Category a: The breast tissue is almost entirely fatty. FINDINGS: The small groups of calcifications in the left breast are stable and have been stable for 2 years. There are no breast masses, areas of architectural distortion or new or suspicious calcifications. Mammographic images were processed with CAD. IMPRESSION: Benign left breast calcifications.  No evidence of malignancy. RECOMMENDATION: Screening mammogram in one year.(Code:SM-B-01Y) I have discussed the findings and recommendations with the patient. Results were also provided in writing at the conclusion of the visit. If applicable, a reminder letter will be sent to the patient regarding the next appointment. BI-RADS CATEGORY  2: Benign. Electronically Signed   By: Lajean Manes M.D.   On: 07/17/2015 14:18    Assessment & Plan:   There are no diagnoses  linked to this encounter. I am having Ms. Fahr maintain her hydrOXYzine, fluticasone, loratadine, rosuvastatin, folic acid, prednisoLONE acetate, clonazePAM, DULoxetine, Vitamin D3, methotrexate, lisinopril-hydrochlorothiazide, naproxen, zolpidem, and HYDROcodone-acetaminophen.  No orders of the defined types were placed in this encounter.    Follow-up: No Follow-up on file.  Walker Kehr, MD

## 2016-05-14 NOTE — Assessment & Plan Note (Signed)
On MTX

## 2016-05-14 NOTE — Assessment & Plan Note (Signed)
Clonazepam prn 

## 2016-05-14 NOTE — Progress Notes (Signed)
Pre visit review using our clinic review tool, if applicable. No additional management support is needed unless otherwise documented below in the visit note. 

## 2016-05-14 NOTE — Assessment & Plan Note (Signed)
On MTX for eye vasculitis

## 2016-05-14 NOTE — Assessment & Plan Note (Signed)
On Vit D 

## 2016-05-15 ENCOUNTER — Ambulatory Visit: Payer: BC Managed Care – PPO | Admitting: Internal Medicine

## 2016-06-11 ENCOUNTER — Other Ambulatory Visit: Payer: Self-pay

## 2016-06-11 MED ORDER — ROSUVASTATIN CALCIUM 20 MG PO TABS: 20.0000 mg | ORAL_TABLET | Freq: Every day | ORAL | 11 refills | Status: DC

## 2016-06-12 ENCOUNTER — Telehealth: Payer: Self-pay | Admitting: *Deleted

## 2016-06-12 DIAGNOSIS — I10 Essential (primary) hypertension: Secondary | ICD-10-CM

## 2016-06-12 DIAGNOSIS — F4323 Adjustment disorder with mixed anxiety and depressed mood: Secondary | ICD-10-CM

## 2016-06-12 DIAGNOSIS — G894 Chronic pain syndrome: Secondary | ICD-10-CM

## 2016-06-12 DIAGNOSIS — E7800 Pure hypercholesterolemia, unspecified: Secondary | ICD-10-CM

## 2016-06-12 DIAGNOSIS — E559 Vitamin D deficiency, unspecified: Secondary | ICD-10-CM

## 2016-06-12 NOTE — Telephone Encounter (Signed)
OK to fill this/these prescription(s) with additional refills x0 Needs to have an OV every 3 months Thank you!  

## 2016-06-12 NOTE — Telephone Encounter (Signed)
Rec'd call pt requesting refill on her Hydrocodone.../lmb 

## 2016-06-13 MED ORDER — HYDROCODONE-ACETAMINOPHEN 5-325 MG PO TABS: 1.0000 | ORAL_TABLET | Freq: Three times a day (TID) | ORAL | 0 refills | Status: DC | PRN

## 2016-06-13 NOTE — Telephone Encounter (Signed)
Printed script, and notified pt rx ready for pick-up...Jessica Lynn

## 2016-07-14 ENCOUNTER — Ambulatory Visit (INDEPENDENT_AMBULATORY_CARE_PROVIDER_SITE_OTHER): Payer: BC Managed Care – PPO | Admitting: Internal Medicine

## 2016-07-14 ENCOUNTER — Encounter: Payer: Self-pay | Admitting: Internal Medicine

## 2016-07-14 ENCOUNTER — Ambulatory Visit (INDEPENDENT_AMBULATORY_CARE_PROVIDER_SITE_OTHER)
Admission: RE | Admit: 2016-07-14 | Discharge: 2016-07-14 | Disposition: A | Discharge status: Home / Self Care | Payer: BC Managed Care – PPO | Source: Ambulatory Visit | Attending: Internal Medicine | Admitting: Internal Medicine

## 2016-07-14 VITALS — BP 166/98 | HR 92 | Temp 98.8°F | Ht 65.0 in | Wt 243.0 lb

## 2016-07-14 DIAGNOSIS — F4323 Adjustment disorder with mixed anxiety and depressed mood: Secondary | ICD-10-CM | POA: Diagnosis not present

## 2016-07-14 DIAGNOSIS — R05 Cough: Secondary | ICD-10-CM

## 2016-07-14 DIAGNOSIS — G894 Chronic pain syndrome: Secondary | ICD-10-CM

## 2016-07-14 DIAGNOSIS — J0101 Acute recurrent maxillary sinusitis: Secondary | ICD-10-CM

## 2016-07-14 DIAGNOSIS — E7800 Pure hypercholesterolemia, unspecified: Secondary | ICD-10-CM

## 2016-07-14 DIAGNOSIS — R059 Cough, unspecified: Secondary | ICD-10-CM

## 2016-07-14 DIAGNOSIS — I10 Essential (primary) hypertension: Secondary | ICD-10-CM | POA: Diagnosis not present

## 2016-07-14 DIAGNOSIS — G8929 Other chronic pain: Secondary | ICD-10-CM | POA: Diagnosis not present

## 2016-07-14 DIAGNOSIS — M2550 Pain in unspecified joint: Secondary | ICD-10-CM | POA: Diagnosis not present

## 2016-07-14 DIAGNOSIS — M545 Low back pain, unspecified: Secondary | ICD-10-CM

## 2016-07-14 DIAGNOSIS — M25511 Pain in right shoulder: Secondary | ICD-10-CM

## 2016-07-14 DIAGNOSIS — E559 Vitamin D deficiency, unspecified: Secondary | ICD-10-CM | POA: Diagnosis not present

## 2016-07-14 MED ORDER — AMOXICILLIN-POT CLAVULANATE 875-125 MG PO TABS: 1.0000 | ORAL_TABLET | Freq: Two times a day (BID) | ORAL | 0 refills | Status: DC

## 2016-07-14 MED ORDER — HYDROCODONE-ACETAMINOPHEN 5-325 MG PO TABS: 1.0000 | ORAL_TABLET | Freq: Three times a day (TID) | ORAL | 0 refills | Status: DC | PRN

## 2016-07-14 MED ORDER — METHYLPREDNISOLONE ACETATE 80 MG/ML IJ SUSP
80.0000 mg | Freq: Once | INTRAMUSCULAR | Status: AC
  Administered 2016-07-14: 80 mg via INTRA_ARTICULAR

## 2016-07-14 NOTE — Progress Notes (Signed)
Pre visit review using our clinic review tool, if applicable. No additional management support is needed unless otherwise documented below in the visit note. 

## 2016-07-14 NOTE — Assessment & Plan Note (Signed)
Start Augmentin 

## 2016-07-14 NOTE — Patient Instructions (Signed)
Postprocedure instructions :    A Band-Aid should be left on for 12 hours. Injection therapy is not a cure itself. It is used in conjunction with other modalities. You can use nonsteroidal anti-inflammatories like ibuprofen , hot and cold compresses. Rest is recommended in the next 24 hours. You need to report immediately  if fever, chills or any signs of infection develop. 

## 2016-07-14 NOTE — Progress Notes (Signed)
Subjective:  Patient ID: Jessica Lynn, female    DOB: Jun 21, 1960  Age: 56 y.o. MRN: [id number]  CC: No chief complaint on file.   HPI Jessica Lynn presents for sinus congestion, cough C/o R shoulder pain - worse. Pt asked for a shot. F/u LBP, insomnia  Outpatient Medications Prior to Visit  Medication Sig Dispense Refill  . Cholecalciferol (VITAMIN D3) 2000 units capsule Take 1 capsule (2,000 Units total) by mouth daily. 100 capsule 3  . clonazePAM (KLONOPIN) 1 MG tablet Take 1 tablet (1 mg total) by mouth 2 (two) times daily. 60 tablet 2  . dorzolamide-timolol (COSOPT) 22.3-6.8 MG/ML ophthalmic solution Place 1 drop into both eyes 2 (two) times daily. 10 mL 12  . DULoxetine (CYMBALTA) 30 MG capsule Take 1 capsule (30 mg total) by mouth at bedtime. 30 capsule 5  . fluorometholone (FML) 0.1 % ophthalmic suspension Place 1 drop into both eyes 4 (four) times daily. 5 mL 0  . fluticasone (FLONASE) 50 MCG/ACT nasal spray 2 sprays in each nostril two times daily 16 g 6  . folic acid (FOLVITE) 1 MG tablet Take 1 mg by mouth daily.    . hydrOXYzine (ATARAX/VISTARIL) 10 MG tablet Take 1 tablet (10 mg total) by mouth 3 (three) times daily as needed. 30 tablet 2  . lisinopril-hydrochlorothiazide (ZESTORETIC) 20-12.5 MG tablet Take 1 tablet by mouth daily. 30 tablet 11  . methotrexate (RHEUMATREX) 2.5 MG tablet Take 8 tablets by mouth once a week.    . zolpidem (AMBIEN) 10 MG tablet TAKE 1 TABLET BY MOUTH AT BEDTIME AS NEEDED FOR SLEEP 30 tablet 3  . HYDROcodone-acetaminophen (NORCO) 5-325 MG tablet Take 1 tablet by mouth 3 (three) times daily as needed for severe pain. 90 tablet 0  . rosuvastatin (CRESTOR) 20 MG tablet Take 1 tablet (20 mg total) by mouth daily. (Patient not taking: Reported on 07/14/2016) 30 tablet 11  . valACYclovir (VALTREX) 1000 MG tablet Take 1 tablet (1,000 mg total) by mouth 3 (three) times daily. (Patient not taking: Reported on 07/14/2016) 21 tablet 0  . loratadine  (CLARITIN) 10 MG tablet Take 1 tablet (10 mg total) by mouth daily. 100 tablet 3  . naproxen (NAPROSYN) 500 MG tablet Take 1 tablet (500 mg total) by mouth 2 (two) times daily with a meal. 60 tablet 3  . prednisoLONE acetate (PRED FORTE) 1 % ophthalmic suspension Place 1 drop into both eyes 4 (four) times daily.     No facility-administered medications prior to visit.     ROS Review of Systems  Constitutional: Negative for activity change, appetite change, chills, fatigue and unexpected weight change.  HENT: Negative for congestion, mouth sores and sinus pressure.   Eyes: Negative for visual disturbance.  Respiratory: Negative for cough and chest tightness.   Gastrointestinal: Negative for abdominal pain and nausea.  Genitourinary: Negative for difficulty urinating, frequency and vaginal pain.  Musculoskeletal: Positive for arthralgias and back pain. Negative for gait problem.  Skin: Negative for pallor and rash.  Neurological: Negative for dizziness, tremors, weakness, numbness and headaches.  Psychiatric/Behavioral: Negative for confusion and sleep disturbance.    Objective:  BP (!) 166/98 (BP Location: Left Arm, Patient Position: Sitting, Cuff Size: Large)   Pulse 92   Temp 98.8 F (37.1 C) (Oral)   Ht 5\' 5"  (1.651 m)   Wt 243 lb 0.6 oz (110.2 kg)   LMP 06/06/2015 Comment: irregular   SpO2 99%   BMI 40.44 kg/m   BP Readings  from Last 3 Encounters:  07/14/16 (!) 166/98  05/14/16 138/72  02/11/16 138/80    Wt Readings from Last 3 Encounters:  07/14/16 243 lb 0.6 oz (110.2 kg)  05/14/16 256 lb (116.1 kg)  02/11/16 261 lb (118.4 kg)    Physical Exam  Constitutional: She appears well-developed. No distress.  HENT:  Head: Normocephalic.  Right Ear: External ear normal.  Left Ear: External ear normal.  Nose: Nose normal.  Mouth/Throat: Oropharynx is clear and moist.  Eyes: Conjunctivae are normal. Pupils are equal, round, and reactive to light. Right eye exhibits no  discharge. Left eye exhibits no discharge.  Neck: Normal range of motion. Neck supple. No JVD present. No tracheal deviation present. No thyromegaly present.  Cardiovascular: Normal rate, regular rhythm and normal heart sounds.   Pulmonary/Chest: No stridor. No respiratory distress. She has no wheezes.  Abdominal: Soft. Bowel sounds are normal. She exhibits no distension and no mass. There is tenderness. There is no rebound and no guarding.  Musculoskeletal: She exhibits no edema or tenderness.  Lymphadenopathy:    She has no cervical adenopathy.  Neurological: She displays normal reflexes. No cranial nerve deficit. She exhibits normal muscle tone. Coordination normal.  Skin: No rash noted. No erythema.  Psychiatric: She has a normal mood and affect. Her behavior is normal. Judgment and thought content normal.   R shoulder is tender     Procedure :Joint Injection,   shoulder   Indication:  Subacromial bursitis with refractory  chronic pain.   Risks including unsuccessful procedure , bleeding, infection, bruising, skin atrophy, "steroid flare-up" and others were explained to the patient in detail as well as the benefits. Informed consent was obtained and signed.   Tthe patient was placed in a comfortable position. Lateral approach was used. Skin was prepped with Betadine and alcohol  and anesthetized with a cooling spray. Then, a 5 cc syringe with a 2 inch long 24-gauge needle was used for a joint injection.. The needle was advanced  Into the subacromial space.The bursa was injected with 3 mL of 2% lidocaine and 40 mg of Depo-Medrol .  Band-Aid was applied.   Tolerated well. Complications: None. Good pain relief following the procedure.   Postprocedure instructions :    A Band-Aid should be left on for 12 hours. Injection therapy is not a cure itself. It is used in conjunction with other modalities. You can use nonsteroidal anti-inflammatories like ibuprofen , hot and cold compresses. Rest  is recommended in the next 24 hours. You need to report immediately  if fever, chills or any signs of infection develop.   Lab Results  Component Value Date   WBC 6.7 07/26/2013   HGB 13.2 07/26/2013   HCT 39.2 07/26/2013   PLT 278.0 07/26/2013   GLUCOSE 94 10/29/2015   CHOL 173 07/26/2013   TRIG 100.0 07/26/2013   HDL 67.80 07/26/2013   LDLDIRECT 160.7 02/06/2012   LDLCALC 85 07/26/2013   ALT 35 07/26/2013   AST 33 07/26/2013   NA 143 10/29/2015   K 3.7 10/29/2015   CL 106 10/29/2015   CREATININE 0.67 10/29/2015   BUN 19 10/29/2015   CO2 31 10/29/2015   TSH 0.61 07/26/2013    Mm Diag Breast Tomo Bilateral  Result Date: 07/17/2015 CLINICAL DATA:  Followup for left breast calcifications. These were initially evaluated in April 2015. No current complaints. EXAM: 2D DIGITAL DIAGNOSTIC BILATERAL MAMMOGRAM WITH CAD AND ADJUNCT TOMO COMPARISON:  Previous exam(s). ACR Breast Density Category a: The  breast tissue is almost entirely fatty. FINDINGS: The small groups of calcifications in the left breast are stable and have been stable for 2 years. There are no breast masses, areas of architectural distortion or new or suspicious calcifications. Mammographic images were processed with CAD. IMPRESSION: Benign left breast calcifications.  No evidence of malignancy. RECOMMENDATION: Screening mammogram in one year.(Code:SM-B-01Y) I have discussed the findings and recommendations with the patient. Results were also provided in writing at the conclusion of the visit. If applicable, a reminder letter will be sent to the patient regarding the next appointment. BI-RADS CATEGORY  2: Benign. Electronically Signed   By: Lajean Manes M.D.   On: 07/17/2015 14:18    Assessment & Plan:   Diagnoses and all orders for this visit:  HYPERCHOLESTEROLEMIA -     HYDROcodone-acetaminophen (NORCO) 5-325 MG tablet; Take 1 tablet by mouth 3 (three) times daily as needed for severe pain.  Chronic pain syndrome -      HYDROcodone-acetaminophen (NORCO) 5-325 MG tablet; Take 1 tablet by mouth 3 (three) times daily as needed for severe pain.  HYPERTENSION, MILD -     HYDROcodone-acetaminophen (NORCO) 5-325 MG tablet; Take 1 tablet by mouth 3 (three) times daily as needed for severe pain.  Vitamin D deficiency -     HYDROcodone-acetaminophen (NORCO) 5-325 MG tablet; Take 1 tablet by mouth 3 (three) times daily as needed for severe pain.  Adjustment disorder with mixed anxiety and depressed mood -     HYDROcodone-acetaminophen (NORCO) 5-325 MG tablet; Take 1 tablet by mouth 3 (three) times daily as needed for severe pain.  Other orders -     amoxicillin-clavulanate (AUGMENTIN) 875-125 MG tablet; Take 1 tablet by mouth 2 (two) times daily.   I have discontinued Ms. Giese loratadine, prednisoLONE acetate, and naproxen. I am also having her start on amoxicillin-clavulanate. Additionally, I am having her maintain her hydrOXYzine, fluticasone, folic acid, DULoxetine, Vitamin D3, methotrexate, lisinopril-hydrochlorothiazide, clonazePAM, zolpidem, dorzolamide-timolol, fluorometholone, valACYclovir, rosuvastatin, Adalimumab, and HYDROcodone-acetaminophen.  Meds ordered this encounter  Medications  . Adalimumab 40 MG/0.8ML PNKT    Sig: Inject 40 mg into the skin.  [name] amoxicillin-clavulanate (AUGMENTIN) 875-125 MG tablet    Sig: Take 1 tablet by mouth 2 (two) times daily.    Dispense:  20 tablet    Refill:  0  . HYDROcodone-acetaminophen (NORCO) 5-325 MG tablet    Sig: Take 1 tablet by mouth 3 (three) times daily as needed for severe pain.    Dispense:  90 tablet    Refill:  0     Follow-up: No Follow-up on file.  Walker Kehr, MD

## 2016-07-14 NOTE — Assessment & Plan Note (Signed)
Prinizide 

## 2016-07-14 NOTE — Assessment & Plan Note (Signed)
Norco prn 

## 2016-07-14 NOTE — Assessment & Plan Note (Signed)
See procedure 

## 2016-07-14 NOTE — Assessment & Plan Note (Signed)
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

## 2016-07-14 NOTE — Assessment & Plan Note (Signed)
On Vit D 

## 2016-07-15 ENCOUNTER — Other Ambulatory Visit: Payer: Self-pay | Admitting: Internal Medicine

## 2016-07-15 DIAGNOSIS — R9389 Abnormal findings on diagnostic imaging of other specified body structures: Secondary | ICD-10-CM

## 2016-07-16 ENCOUNTER — Telehealth: Payer: Self-pay | Admitting: Internal Medicine

## 2016-07-16 NOTE — Telephone Encounter (Signed)
Routing to dr plotnikov, please advise, I will call patient back, thanks 

## 2016-07-16 NOTE — Telephone Encounter (Signed)
Pt called in and gave her chest xray results,  She said that she is not feeling better an would like to stay out of work thur and Friday.  She would like to see if dr Camila Li would write her a note to be out of work those 2 days

## 2016-07-18 NOTE — Telephone Encounter (Signed)
Ok Pls write   Thx 

## 2016-07-18 NOTE — Telephone Encounter (Signed)
Letter written, patient advised ready for pick up at front office

## 2016-08-18 ENCOUNTER — Ambulatory Visit (INDEPENDENT_AMBULATORY_CARE_PROVIDER_SITE_OTHER)
Admission: RE | Admit: 2016-08-18 | Discharge: 2016-08-18 | Disposition: A | Discharge status: Home / Self Care | Payer: BC Managed Care – PPO | Source: Ambulatory Visit | Attending: Internal Medicine | Admitting: Internal Medicine

## 2016-08-18 DIAGNOSIS — R938 Abnormal findings on diagnostic imaging of other specified body structures: Secondary | ICD-10-CM

## 2016-08-18 DIAGNOSIS — R9389 Abnormal findings on diagnostic imaging of other specified body structures: Secondary | ICD-10-CM

## 2016-08-25 ENCOUNTER — Other Ambulatory Visit: Payer: Self-pay | Admitting: Internal Medicine

## 2016-08-27 ENCOUNTER — Telehealth: Payer: Self-pay | Admitting: *Deleted

## 2016-08-27 DIAGNOSIS — G894 Chronic pain syndrome: Secondary | ICD-10-CM

## 2016-08-27 DIAGNOSIS — E559 Vitamin D deficiency, unspecified: Secondary | ICD-10-CM

## 2016-08-27 DIAGNOSIS — E7800 Pure hypercholesterolemia, unspecified: Secondary | ICD-10-CM

## 2016-08-27 DIAGNOSIS — I10 Essential (primary) hypertension: Secondary | ICD-10-CM

## 2016-08-27 DIAGNOSIS — F4323 Adjustment disorder with mixed anxiety and depressed mood: Secondary | ICD-10-CM

## 2016-08-27 MED ORDER — HYDROCODONE-ACETAMINOPHEN 5-325 MG PO TABS: 1.0000 | ORAL_TABLET | Freq: Three times a day (TID) | ORAL | 0 refills | Status: DC | PRN

## 2016-08-27 NOTE — Telephone Encounter (Signed)
Rec'd call pt requesting refill on her Hydrocodone.../lmb 

## 2016-08-27 NOTE — Telephone Encounter (Signed)
Notified pt rx ready for pick-up.../lmb 

## 2016-08-27 NOTE — Telephone Encounter (Signed)
OK to fill this/these prescription(s) with additional refills x0 Needs to have an OV every 3 months Thank you!  

## 2016-10-13 ENCOUNTER — Encounter: Payer: Self-pay | Admitting: Internal Medicine

## 2016-10-13 ENCOUNTER — Ambulatory Visit (INDEPENDENT_AMBULATORY_CARE_PROVIDER_SITE_OTHER): Payer: BC Managed Care – PPO | Admitting: Internal Medicine

## 2016-10-13 VITALS — BP 132/86 | HR 77 | Temp 98.3°F | Ht 65.0 in | Wt 236.0 lb

## 2016-10-13 DIAGNOSIS — E7800 Pure hypercholesterolemia, unspecified: Secondary | ICD-10-CM | POA: Diagnosis not present

## 2016-10-13 DIAGNOSIS — F4323 Adjustment disorder with mixed anxiety and depressed mood: Secondary | ICD-10-CM | POA: Diagnosis not present

## 2016-10-13 DIAGNOSIS — G894 Chronic pain syndrome: Secondary | ICD-10-CM | POA: Diagnosis not present

## 2016-10-13 DIAGNOSIS — I10 Essential (primary) hypertension: Secondary | ICD-10-CM

## 2016-10-13 DIAGNOSIS — Z0000 Encounter for general adult medical examination without abnormal findings: Secondary | ICD-10-CM

## 2016-10-13 DIAGNOSIS — E559 Vitamin D deficiency, unspecified: Secondary | ICD-10-CM | POA: Diagnosis not present

## 2016-10-13 MED ORDER — HYDROCODONE-ACETAMINOPHEN 5-325 MG PO TABS: 1.0000 | ORAL_TABLET | Freq: Three times a day (TID) | ORAL | 0 refills | Status: DC | PRN

## 2016-10-13 MED ORDER — ZOLPIDEM TARTRATE 10 MG PO TABS: ORAL_TABLET | ORAL | 3 refills | Status: DC

## 2016-10-13 MED ORDER — VALSARTAN-HYDROCHLOROTHIAZIDE 160-12.5 MG PO TABS: 1.0000 | ORAL_TABLET | Freq: Every day | ORAL | 11 refills | Status: DC

## 2016-10-13 NOTE — Progress Notes (Signed)
Subjective:  Patient ID: Jessica Lynn, female    DOB: 10-09-1960  Age: 56 y.o. MRN: [id number]  CC: No chief complaint on file.   HPI Jessica Lynn presents for retinal vasculitis on MTX, allergies, chronic pain f/u. C/o cough  Outpatient Medications Prior to Visit  Medication Sig Dispense Refill  . Adalimumab 40 MG/0.8ML PNKT Inject 40 mg into the skin.    [name] amoxicillin-clavulanate (AUGMENTIN) 875-125 MG tablet Take 1 tablet by mouth 2 (two) times daily. 20 tablet 0  . Cholecalciferol (VITAMIN D3) 2000 units capsule Take 1 capsule (2,000 Units total) by mouth daily. 100 capsule 3  . clonazePAM (KLONOPIN) 1 MG tablet Take 1 tablet (1 mg total) by mouth 2 (two) times daily. 60 tablet 2  . dorzolamide-timolol (COSOPT) 22.3-6.8 MG/ML ophthalmic solution Place 1 drop into both eyes 2 (two) times daily. 10 mL 12  . DULoxetine (CYMBALTA) 30 MG capsule Take 1 capsule (30 mg total) by mouth at bedtime. 30 capsule 5  . fluorometholone (FML) 0.1 % ophthalmic suspension Place 1 drop into both eyes 4 (four) times daily. 5 mL 0  . fluticasone (FLONASE) 50 MCG/ACT nasal spray 2 sprays in each nostril two times daily 16 g 6  . folic acid (FOLVITE) 1 MG tablet Take 1 mg by mouth daily.    [name] HYDROcodone-acetaminophen (NORCO) 5-325 MG tablet Take 1 tablet by mouth 3 (three) times daily as needed for severe pain. 90 tablet 0  . hydrOXYzine (ATARAX/VISTARIL) 10 MG tablet TAKE ONE TABLET BY MOUTH THREE TIMES DAILY AS NEEDED 90 tablet 0  . lisinopril-hydrochlorothiazide (ZESTORETIC) 20-12.5 MG tablet Take 1 tablet by mouth daily. 30 tablet 11  . methotrexate (RHEUMATREX) 2.5 MG tablet Take 8 tablets by mouth once a week.    . rosuvastatin (CRESTOR) 20 MG tablet Take 1 tablet (20 mg total) by mouth daily. 30 tablet 11  . valACYclovir (VALTREX) 1000 MG tablet Take 1 tablet (1,000 mg total) by mouth 3 (three) times daily. 21 tablet 0  . zolpidem (AMBIEN) 10 MG tablet TAKE 1 TABLET BY MOUTH AT BEDTIME AS NEEDED  FOR SLEEP 30 tablet 3   No facility-administered medications prior to visit.     ROS Review of Systems  Constitutional: Negative for activity change, appetite change, chills, fatigue and unexpected weight change.  HENT: Negative for congestion, mouth sores and sinus pressure.   Eyes: Negative for visual disturbance.  Respiratory: Negative for cough and chest tightness.   Gastrointestinal: Negative for abdominal pain and nausea.  Genitourinary: Negative for difficulty urinating, frequency and vaginal pain.  Musculoskeletal: Positive for arthralgias. Negative for back pain and gait problem.  Skin: Negative for pallor and rash.  Neurological: Negative for dizziness, tremors, weakness, numbness and headaches.  Psychiatric/Behavioral: Negative for confusion, sleep disturbance and suicidal ideas.    Objective:  BP 132/86 (BP Location: Left Arm, Patient Position: Sitting, Cuff Size: Large)   Pulse 77   Temp 98.3 F (36.8 C) (Oral)   Ht 5\' 5"  (1.651 m)   Wt 236 lb (107 kg)   LMP 06/06/2015 Comment: irregular   SpO2 100%   BMI 39.27 kg/m   BP Readings from Last 3 Encounters:  10/13/16 132/86  07/14/16 (!) 166/98  05/14/16 138/72    Wt Readings from Last 3 Encounters:  10/13/16 236 lb (107 kg)  07/14/16 243 lb 0.6 oz (110.2 kg)  05/14/16 256 lb (116.1 kg)    Physical Exam  Constitutional: She appears well-developed. No distress.  HENT:  Head: Normocephalic.  Right Ear: External ear normal.  Left Ear: External ear normal.  Nose: Nose normal.  Mouth/Throat: Oropharynx is clear and moist.  Eyes: Conjunctivae are normal. Pupils are equal, round, and reactive to light. Right eye exhibits no discharge. Left eye exhibits no discharge.  Neck: Normal range of motion. Neck supple. No JVD present. No tracheal deviation present. No thyromegaly present.  Cardiovascular: Normal rate, regular rhythm and normal heart sounds.   Pulmonary/Chest: No stridor. No respiratory distress. She has  no wheezes.  Abdominal: Soft. Bowel sounds are normal. She exhibits no distension and no mass. There is no tenderness. There is no rebound and no guarding.  Musculoskeletal: She exhibits tenderness. She exhibits no edema.  Lymphadenopathy:    She has no cervical adenopathy.  Neurological: She displays normal reflexes. No cranial nerve deficit. She exhibits normal muscle tone. Coordination normal.  Skin: No rash noted. No erythema.  Psychiatric: She has a normal mood and affect. Her behavior is normal. Judgment and thought content normal.  Obese LS tender  Lab Results  Component Value Date   WBC 6.7 07/26/2013   HGB 13.2 07/26/2013   HCT 39.2 07/26/2013   PLT 278.0 07/26/2013   GLUCOSE 94 10/29/2015   CHOL 173 07/26/2013   TRIG 100.0 07/26/2013   HDL 67.80 07/26/2013   LDLDIRECT 160.7 02/06/2012   LDLCALC 85 07/26/2013   ALT 35 07/26/2013   AST 33 07/26/2013   NA 143 10/29/2015   K 3.7 10/29/2015   CL 106 10/29/2015   CREATININE 0.67 10/29/2015   BUN 19 10/29/2015   CO2 31 10/29/2015   TSH 0.61 07/26/2013    Dg Chest 2 View  Result Date: 08/18/2016 CLINICAL DATA:  Follow-up pneumonia intermittent dyspnea EXAM: CHEST  2 VIEW COMPARISON:  07/14/2016, 11/28/2009 FINDINGS: The right lung is clear. Linear opacity at the lingula and left base, slightly improved. No new consolidation or effusion. Stable cardiomediastinal silhouette. No pneumothorax. IMPRESSION: Improved aeration of the left lung base and lingula. Residual linear opacity at the left base is suggestive of atelectasis or scarring. Electronically Signed   By: Donavan Foil M.D.   On: 08/18/2016 17:04    Assessment & Plan:   There are no diagnoses linked to this encounter. I am having Ms. Wenker maintain her fluticasone, folic acid, DULoxetine, Vitamin D3, methotrexate, lisinopril-hydrochlorothiazide, clonazePAM, zolpidem, dorzolamide-timolol, fluorometholone, valACYclovir, rosuvastatin, Adalimumab,  amoxicillin-clavulanate, hydrOXYzine, and HYDROcodone-acetaminophen.  No orders of the defined types were placed in this encounter.    Follow-up: No Follow-up on file.  Walker Kehr, MD

## 2016-11-14 ENCOUNTER — Telehealth: Payer: Self-pay | Admitting: Internal Medicine

## 2016-11-14 NOTE — Telephone Encounter (Signed)
Pt called for a refill of her HYDROcodone-acetaminophen (NORCO/VICODIN) 5-325 MG tablet  Last seen for 68mo fu 10/13/2016 Please advise

## 2016-11-14 NOTE — Telephone Encounter (Signed)
Check [place] registry last filled 10/13/2016 @ [facility] pls advise...Jessica Lynn

## 2016-11-15 NOTE — Telephone Encounter (Signed)
OK to fill this/these prescription(s) with additional refills x0 Needs to have an OV every 3 months Thank you!  

## 2016-11-17 MED ORDER — HYDROCODONE-ACETAMINOPHEN 5-325 MG PO TABS: 1.0000 | ORAL_TABLET | Freq: Three times a day (TID) | ORAL | 0 refills | Status: DC | PRN

## 2016-11-17 NOTE — Telephone Encounter (Signed)
Printed rx MD signed. Notified pt rx ready for pick-up...Jessica Lynn

## 2016-12-12 ENCOUNTER — Telehealth: Payer: Self-pay | Admitting: Internal Medicine

## 2016-12-12 NOTE — Telephone Encounter (Signed)
Pt called needing a refill on her Cholecalciferol (VITAMIN D3) 2000 units capsule and clonazePAM (KLONOPIN) 1 MG tablet sent to [facility] on [address].

## 2016-12-14 NOTE — Telephone Encounter (Signed)
Vit D3 #100 3 ref Clonazepam w/2 ref Thx

## 2016-12-15 MED ORDER — VITAMIN D3 50 MCG (2000 UT) PO CAPS: 2000.0000 [IU] | ORAL_CAPSULE | Freq: Every day | ORAL | 3 refills | Status: AC

## 2016-12-15 MED ORDER — CLONAZEPAM 1 MG PO TABS: 1.0000 mg | ORAL_TABLET | Freq: Two times a day (BID) | ORAL | 2 refills | Status: DC

## 2016-12-15 NOTE — Telephone Encounter (Signed)
Called refill into [facility] left on pharmacy vm...[name]

## 2016-12-18 ENCOUNTER — Telehealth: Payer: Self-pay | Admitting: Internal Medicine

## 2016-12-18 MED ORDER — HYDROCODONE-ACETAMINOPHEN 5-325 MG PO TABS: 1.0000 | ORAL_TABLET | Freq: Three times a day (TID) | ORAL | 0 refills | Status: DC | PRN

## 2016-12-18 NOTE — Telephone Encounter (Signed)
Notified pt rx ready for pick-up.../lmb 

## 2016-12-18 NOTE — Telephone Encounter (Signed)
Check [place] registry last filled 11/17/2016...Jessica Lynn
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
## 2016-12-18 NOTE — Telephone Encounter (Signed)
OK to fill this/these prescription(s) with additional refills x0 Needs to have an OV every 3 months Thank you!  

## 2017-01-14 ENCOUNTER — Other Ambulatory Visit (INDEPENDENT_AMBULATORY_CARE_PROVIDER_SITE_OTHER): Payer: BC Managed Care – PPO

## 2017-01-14 ENCOUNTER — Encounter: Payer: Self-pay | Admitting: Internal Medicine

## 2017-01-14 ENCOUNTER — Ambulatory Visit (INDEPENDENT_AMBULATORY_CARE_PROVIDER_SITE_OTHER): Payer: BC Managed Care – PPO | Admitting: Internal Medicine

## 2017-01-14 DIAGNOSIS — E559 Vitamin D deficiency, unspecified: Secondary | ICD-10-CM | POA: Diagnosis not present

## 2017-01-14 DIAGNOSIS — M545 Low back pain, unspecified: Secondary | ICD-10-CM

## 2017-01-14 DIAGNOSIS — I10 Essential (primary) hypertension: Secondary | ICD-10-CM | POA: Diagnosis not present

## 2017-01-14 DIAGNOSIS — F411 Generalized anxiety disorder: Secondary | ICD-10-CM | POA: Diagnosis not present

## 2017-01-14 DIAGNOSIS — G894 Chronic pain syndrome: Secondary | ICD-10-CM | POA: Diagnosis not present

## 2017-01-14 DIAGNOSIS — M2550 Pain in unspecified joint: Secondary | ICD-10-CM

## 2017-01-14 DIAGNOSIS — H3093 Unspecified chorioretinal inflammation, bilateral: Secondary | ICD-10-CM

## 2017-01-14 DIAGNOSIS — H3090 Unspecified chorioretinal inflammation, unspecified eye: Secondary | ICD-10-CM | POA: Insufficient documentation

## 2017-01-14 DIAGNOSIS — Z0000 Encounter for general adult medical examination without abnormal findings: Secondary | ICD-10-CM | POA: Diagnosis not present

## 2017-01-14 DIAGNOSIS — J309 Allergic rhinitis, unspecified: Secondary | ICD-10-CM

## 2017-01-14 LAB — CBC WITH DIFFERENTIAL/PLATELET
BASOS ABS: 0.1 10*3/uL (ref 0.0–0.1)
BASOS PCT: 0.6 % (ref 0.0–3.0)
EOS ABS: 0.1 10*3/uL (ref 0.0–0.7)
Eosinophils Relative: 0.8 % (ref 0.0–5.0)
HCT: 36.3 % (ref 36.0–46.0)
Hemoglobin: 11.8 g/dL — ABNORMAL LOW (ref 12.0–15.0)
LYMPHS ABS: 3 10*3/uL (ref 0.7–4.0)
Lymphocytes Relative: 36.8 % (ref 12.0–46.0)
MCHC: 32.6 g/dL (ref 30.0–36.0)
MCV: 94.4 fl (ref 78.0–100.0)
Monocytes Absolute: 0.7 10*3/uL (ref 0.1–1.0)
Monocytes Relative: 8.7 % (ref 3.0–12.0)
NEUTROS ABS: 4.3 10*3/uL (ref 1.4–7.7)
NEUTROS PCT: 53.1 % (ref 43.0–77.0)
PLATELETS: 258 10*3/uL (ref 150.0–400.0)
RBC: 3.85 Mil/uL — ABNORMAL LOW (ref 3.87–5.11)
RDW: 12.9 % (ref 11.5–15.5)
WBC: 8.2 10*3/uL (ref 4.0–10.5)

## 2017-01-14 LAB — URINALYSIS
Ketones, ur: NEGATIVE
Leukocytes, UA: NEGATIVE
Nitrite: NEGATIVE
PH: 5.5 (ref 5.0–8.0)
TOTAL PROTEIN, URINE-UPE24: NEGATIVE
URINE GLUCOSE: NEGATIVE
UROBILINOGEN UA: 0.2 (ref 0.0–1.0)

## 2017-01-14 LAB — BASIC METABOLIC PANEL
BUN: 11 mg/dL (ref 6–23)
CALCIUM: 9.6 mg/dL (ref 8.4–10.5)
CHLORIDE: 107 meq/L (ref 96–112)
CO2: 27 meq/L (ref 19–32)
Creatinine, Ser: 0.71 mg/dL (ref 0.40–1.20)
GFR: 109.94 mL/min (ref >60.00)
GLUCOSE: 98 mg/dL (ref 70–99)
POTASSIUM: 3.8 meq/L (ref 3.5–5.1)
SODIUM: 141 meq/L (ref 135–145)

## 2017-01-14 LAB — LIPID PANEL
CHOL/HDL RATIO: 2
Cholesterol: 155 mg/dL (ref 0–200)
HDL: 64.7 mg/dL (ref >39.00)
LDL CALC: 75 mg/dL (ref 0–99)
NonHDL: 89.85
Triglycerides: 74 mg/dL (ref 0.0–149.0)
VLDL: 14.8 mg/dL (ref 0.0–40.0)

## 2017-01-14 LAB — HEPATIC FUNCTION PANEL
ALK PHOS: 56 U/L (ref 39–117)
ALT: 19 U/L (ref 0–35)
AST: 20 U/L (ref 0–37)
Albumin: 4.3 g/dL (ref 3.5–5.2)
Bilirubin, Direct: 0.3 mg/dL (ref 0.0–0.3)
TOTAL PROTEIN: 7.1 g/dL (ref 6.0–8.3)
Total Bilirubin: 1.2 mg/dL (ref 0.2–1.2)

## 2017-01-14 LAB — VITAMIN B12: VITAMIN B 12: 402 pg/mL (ref 211–911)

## 2017-01-14 LAB — TSH: TSH: 0.8 u[IU]/mL (ref 0.35–4.50)

## 2017-01-14 MED ORDER — CLONAZEPAM 1 MG PO TABS: 1.0000 mg | ORAL_TABLET | Freq: Two times a day (BID) | ORAL | 2 refills | Status: DC

## 2017-01-14 MED ORDER — ZOLPIDEM TARTRATE 10 MG PO TABS: ORAL_TABLET | ORAL | 3 refills | Status: DC

## 2017-01-14 MED ORDER — HYDROCODONE-ACETAMINOPHEN 5-325 MG PO TABS: 1.0000 | ORAL_TABLET | Freq: Three times a day (TID) | ORAL | 0 refills | Status: DC | PRN

## 2017-01-14 NOTE — Assessment & Plan Note (Signed)
On Vit D 

## 2017-01-14 NOTE — Assessment & Plan Note (Signed)
Norco prn  Potential benefits of a long term opioids use as well as potential risks (i.e. addiction risk, apnea etc) and complications (i.e. Somnolence, constipation and others) were explained to the patient and were aknowledged. 

## 2017-01-14 NOTE — Assessment & Plan Note (Addendum)
Flonase, Claritin - restart

## 2017-01-14 NOTE — Assessment & Plan Note (Signed)
norco prn 

## 2017-01-14 NOTE — Assessment & Plan Note (Signed)
F/u w/ Dr Manuella Ghazi at [facility]

## 2017-01-14 NOTE — Assessment & Plan Note (Signed)
Clonazepam prn prn Not to take w/Norco  Potential benefits of a long term benzodiazepines  use as well as potential risks  and complications were explained to the patient and were aknowledged.

## 2017-01-14 NOTE — Assessment & Plan Note (Signed)
Valsartan HCT 

## 2017-01-14 NOTE — Patient Instructions (Signed)
Re-start Claritin

## 2017-01-14 NOTE — Progress Notes (Signed)
Subjective:  Patient ID: Jessica Lynn, female    DOB: [birth year]  Age: 56 y.o. MRN: [id number]  CC: No chief complaint on file.   HPI Jessica Lynn presents for fatigue, allergies, LBP f/u. Pt refused a flu shot  Outpatient Medications Prior to Visit  Medication Sig Dispense Refill  . Adalimumab 40 MG/0.8ML PNKT Inject 40 mg into the skin.    . Cholecalciferol (VITAMIN D3) 2000 units capsule Take 1 capsule (2,000 Units total) by mouth daily. 100 capsule 3  . clonazePAM (KLONOPIN) 1 MG tablet Take 1 tablet (1 mg total) by mouth 2 (two) times daily. 60 tablet 2  . dorzolamide-timolol (COSOPT) 22.3-6.8 MG/ML ophthalmic solution Place 1 drop into both eyes 2 (two) times daily. 10 mL 12  . fluorometholone (FML) 0.1 % ophthalmic suspension Place 1 drop into both eyes 4 (four) times daily. 5 mL 0  . fluticasone (FLONASE) 50 MCG/ACT nasal spray 2 sprays in each nostril two times daily 16 g 6  . folic acid (FOLVITE) 1 MG tablet Take 1 mg by mouth daily.    [name] HYDROcodone-acetaminophen (NORCO/VICODIN) 5-325 MG tablet Take 1 tablet by mouth 3 (three) times daily as needed for moderate pain. 90 tablet 0  . hydrOXYzine (ATARAX/VISTARIL) 10 MG tablet TAKE ONE TABLET BY MOUTH THREE TIMES DAILY AS NEEDED 90 tablet 0  . rosuvastatin (CRESTOR) 20 MG tablet Take 1 tablet (20 mg total) by mouth daily. 30 tablet 11  . valACYclovir (VALTREX) 1000 MG tablet Take 1 tablet (1,000 mg total) by mouth 3 (three) times daily. 21 tablet 0  . valsartan-hydrochlorothiazide (DIOVAN HCT) 160-12.5 MG tablet Take 1 tablet by mouth daily. 30 tablet 11  . zolpidem (AMBIEN) 10 MG tablet TAKE 1 TABLET BY MOUTH AT BEDTIME AS NEEDED FOR SLEEP 30 tablet 3  . amoxicillin-clavulanate (AUGMENTIN) 875-125 MG tablet Take 1 tablet by mouth 2 (two) times daily. (Patient not taking: Reported on 01/14/2017) 20 tablet 0  . DULoxetine (CYMBALTA) 30 MG capsule Take 1 capsule (30 mg total) by mouth at bedtime. (Patient not taking: Reported on  01/14/2017) 30 capsule 5  . methotrexate (RHEUMATREX) 2.5 MG tablet Take 8 tablets by mouth once a week.     No facility-administered medications prior to visit.     ROS Review of Systems  Constitutional: Positive for fatigue. Negative for activity change, appetite change, chills and unexpected weight change.  HENT: Positive for congestion, postnasal drip, rhinorrhea and sinus pressure. Negative for mouth sores.   Eyes: Negative for visual disturbance.  Respiratory: Negative for cough and chest tightness.   Gastrointestinal: Negative for abdominal pain and nausea.  Genitourinary: Negative for difficulty urinating, frequency and vaginal pain.  Musculoskeletal: Positive for arthralgias, back pain and gait problem.  Skin: Negative for pallor and rash.  Neurological: Negative for dizziness, tremors, weakness, numbness and headaches.  Psychiatric/Behavioral: Negative for confusion, sleep disturbance and suicidal ideas.    Objective:  BP 130/82 (BP Location: Left Arm, Patient Position: Sitting, Cuff Size: Large)   Pulse 78   Temp 98.3 F (36.8 C) (Oral)   Ht 5\' 5"  (1.651 m)   Wt 238 lb (108 kg)   LMP 06/06/2015 Comment: irregular   SpO2 98%   BMI 39.61 kg/m   BP Readings from Last 3 Encounters:  01/14/17 130/82  10/13/16 132/86  07/14/16 (!) 166/98    Wt Readings from Last 3 Encounters:  01/14/17 238 lb (108 kg)  10/13/16 236 lb (107 kg)  07/14/16 243 lb 0.6  oz (110.2 kg)    Physical Exam  Constitutional: She appears well-developed. No distress.  HENT:  Head: Normocephalic.  Right Ear: External ear normal.  Left Ear: External ear normal.  Nose: Nose normal.  Mouth/Throat: Oropharynx is clear and moist.  Eyes: Pupils are equal, round, and reactive to light. Conjunctivae are normal. Right eye exhibits no discharge. Left eye exhibits no discharge.  Neck: Normal range of motion. Neck supple. No JVD present. No tracheal deviation present. No thyromegaly present.    Cardiovascular: Normal rate, regular rhythm and normal heart sounds.   Pulmonary/Chest: No stridor. No respiratory distress. She has no wheezes.  Abdominal: Soft. Bowel sounds are normal. She exhibits no distension and no mass. There is no tenderness. There is no rebound and no guarding.  Musculoskeletal: She exhibits tenderness. She exhibits no edema.  Lymphadenopathy:    She has no cervical adenopathy.  Neurological: She displays normal reflexes. No cranial nerve deficit. She exhibits normal muscle tone. Coordination normal.  Skin: No rash noted. No erythema.  Psychiatric: She has a normal mood and affect. Her behavior is normal. Judgment and thought content normal.  Obese  Lab Results  Component Value Date   WBC 6.7 07/26/2013   HGB 13.2 07/26/2013   HCT 39.2 07/26/2013   PLT 278.0 07/26/2013   GLUCOSE 94 10/29/2015   CHOL 173 07/26/2013   TRIG 100.0 07/26/2013   HDL 67.80 07/26/2013   LDLDIRECT 160.7 02/06/2012   LDLCALC 85 07/26/2013   ALT 35 07/26/2013   AST 33 07/26/2013   NA 143 10/29/2015   K 3.7 10/29/2015   CL 106 10/29/2015   CREATININE 0.67 10/29/2015   BUN 19 10/29/2015   CO2 31 10/29/2015   TSH 0.61 07/26/2013    Dg Chest 2 View  Result Date: 08/18/2016 CLINICAL DATA:  Follow-up pneumonia intermittent dyspnea EXAM: CHEST  2 VIEW COMPARISON:  07/14/2016, 11/28/2009 FINDINGS: The right lung is clear. Linear opacity at the lingula and left base, slightly improved. No new consolidation or effusion. Stable cardiomediastinal silhouette. No pneumothorax. IMPRESSION: Improved aeration of the left lung base and lingula. Residual linear opacity at the left base is suggestive of atelectasis or scarring. Electronically Signed   By: Donavan Foil M.D.   On: 08/18/2016 17:04    Assessment & Plan:   There are no diagnoses linked to this encounter. I have discontinued Ms. Roehr DULoxetine, methotrexate, and amoxicillin-clavulanate. I am also having her maintain her  fluticasone, folic acid, dorzolamide-timolol, fluorometholone, valACYclovir, rosuvastatin, Adalimumab, hydrOXYzine, valsartan-hydrochlorothiazide, zolpidem, Vitamin D3, clonazePAM, and HYDROcodone-acetaminophen.  No orders of the defined types were placed in this encounter.    Follow-up: No Follow-up on file.  Walker Kehr, MD

## 2017-02-16 ENCOUNTER — Telehealth: Payer: Self-pay | Admitting: Internal Medicine

## 2017-02-16 NOTE — Telephone Encounter (Signed)
HYDROcodone-acetaminophen (NORCO/VICODIN) 5-325 MG tablet   Patient is requesting a refill on. Please advise.

## 2017-02-18 MED ORDER — HYDROCODONE-ACETAMINOPHEN 5-325 MG PO TABS: 1.0000 | ORAL_TABLET | Freq: Three times a day (TID) | ORAL | 0 refills | Status: DC | PRN

## 2017-02-18 NOTE — Telephone Encounter (Signed)
Done OV q 3 mo Thx 

## 2017-02-18 NOTE — Telephone Encounter (Signed)
Patient has been informed. She has an appointment for 04/08/17

## 2017-03-20 ENCOUNTER — Other Ambulatory Visit: Payer: Self-pay | Admitting: Internal Medicine

## 2017-03-20 ENCOUNTER — Telehealth: Payer: Self-pay | Admitting: Internal Medicine

## 2017-03-20 DIAGNOSIS — M8949 Other hypertrophic osteoarthropathy, multiple sites: Secondary | ICD-10-CM

## 2017-03-20 DIAGNOSIS — M159 Polyosteoarthritis, unspecified: Secondary | ICD-10-CM

## 2017-03-20 DIAGNOSIS — M150 Primary generalized (osteo)arthritis: Principal | ICD-10-CM

## 2017-03-20 MED ORDER — HYDROCODONE-ACETAMINOPHEN 5-325 MG PO TABS: 1.0000 | ORAL_TABLET | Freq: Three times a day (TID) | ORAL | 0 refills | Status: DC | PRN

## 2017-03-20 NOTE — Telephone Encounter (Signed)
Refill request on Norco please.

## 2017-03-20 NOTE — Telephone Encounter (Signed)
Check [place] registry last filled 02/18/2017 MD is out of the office pls advise....Jessica Lynn

## 2017-03-20 NOTE — Telephone Encounter (Signed)
Copied from [place] [id number]. Topic: Quick Communication - Rx Refill/Question >> Mar 20, 2017 10:04 AM Clack, Laban Emperor wrote: Has the patient contacted their pharmacy? Yes.     (Agent: If no, request that the patient contact the pharmacy for the refill.)   Preferred Pharmacy (with phone number or street name): [facility] [place] [place], [place] [address] [phone]  Pt is requesting a med refill on her HYDROcodone-acetaminophen (NORCO/VICODIN) 5-325 MG tablet [[id number]]     Agent: Please be advised that RX refills may take up to 3 business days. We ask that you follow-up with your pharmacy.

## 2017-03-20 NOTE — Telephone Encounter (Signed)
Notified pt rx has been approved and sent to [place].Marland KitchenJohny Lynn

## 2017-03-20 NOTE — Telephone Encounter (Signed)
RX sent

## 2017-04-08 ENCOUNTER — Ambulatory Visit: Payer: BC Managed Care – PPO | Admitting: Internal Medicine

## 2017-04-08 ENCOUNTER — Encounter: Payer: Self-pay | Admitting: Internal Medicine

## 2017-04-08 DIAGNOSIS — F411 Generalized anxiety disorder: Secondary | ICD-10-CM

## 2017-04-08 DIAGNOSIS — M159 Polyosteoarthritis, unspecified: Secondary | ICD-10-CM

## 2017-04-08 DIAGNOSIS — H35063 Retinal vasculitis, bilateral: Secondary | ICD-10-CM | POA: Diagnosis not present

## 2017-04-08 DIAGNOSIS — E7800 Pure hypercholesterolemia, unspecified: Secondary | ICD-10-CM | POA: Diagnosis not present

## 2017-04-08 DIAGNOSIS — E559 Vitamin D deficiency, unspecified: Secondary | ICD-10-CM

## 2017-04-08 DIAGNOSIS — M150 Primary generalized (osteo)arthritis: Secondary | ICD-10-CM

## 2017-04-08 DIAGNOSIS — M545 Low back pain, unspecified: Secondary | ICD-10-CM

## 2017-04-08 DIAGNOSIS — M8949 Other hypertrophic osteoarthropathy, multiple sites: Secondary | ICD-10-CM

## 2017-04-08 DIAGNOSIS — M2550 Pain in unspecified joint: Secondary | ICD-10-CM | POA: Diagnosis not present

## 2017-04-08 MED ORDER — ZOLPIDEM TARTRATE 10 MG PO TABS: ORAL_TABLET | ORAL | 3 refills | Status: DC

## 2017-04-08 MED ORDER — CLONAZEPAM 1 MG PO TABS: 1.0000 mg | ORAL_TABLET | Freq: Two times a day (BID) | ORAL | 2 refills | Status: DC

## 2017-04-08 MED ORDER — HYDROCODONE-ACETAMINOPHEN 5-325 MG PO TABS: 1.0000 | ORAL_TABLET | Freq: Three times a day (TID) | ORAL | 0 refills | Status: DC | PRN

## 2017-04-08 NOTE — Progress Notes (Signed)
Subjective:  Patient ID: Jessica Lynn, female    DOB: [birth year]  Age: 57 y.o. MRN: [id number]  CC: No chief complaint on file.   HPI IYA HAMED presents for chronic pain, anxiety,  Dyslipidemia f/u C/o worsening pain in the hands.Marland KitchenMarland KitchenOff MTX On Crestor...  Outpatient Medications Prior to Visit  Medication Sig Dispense Refill  . Adalimumab 40 MG/0.8ML PNKT Inject 40 mg into the skin.    . Cholecalciferol (VITAMIN D3) 2000 units capsule Take 1 capsule (2,000 Units total) by mouth daily. 100 capsule 3  . clonazePAM (KLONOPIN) 1 MG tablet Take 1 tablet (1 mg total) by mouth 2 (two) times daily. 60 tablet 2  . dorzolamide-timolol (COSOPT) 22.3-6.8 MG/ML ophthalmic solution Place 1 drop into both eyes 2 (two) times daily. 10 mL 12  . fluorometholone (FML) 0.1 % ophthalmic suspension Place 1 drop into both eyes 4 (four) times daily. 5 mL 0  . fluticasone (FLONASE) 50 MCG/ACT nasal spray 2 sprays in each nostril two times daily 16 g 6  . folic acid (FOLVITE) 1 MG tablet Take 1 mg by mouth daily.    [name] HYDROcodone-acetaminophen (NORCO/VICODIN) 5-325 MG tablet Take 1 tablet by mouth 3 (three) times daily as needed for moderate pain. 90 tablet 0  . hydrOXYzine (ATARAX/VISTARIL) 10 MG tablet TAKE ONE TABLET BY MOUTH THREE TIMES DAILY AS NEEDED 90 tablet 0  . rosuvastatin (CRESTOR) 20 MG tablet Take 1 tablet (20 mg total) by mouth daily. 30 tablet 11  . valACYclovir (VALTREX) 1000 MG tablet Take 1 tablet (1,000 mg total) by mouth 3 (three) times daily. 21 tablet 0  . valsartan-hydrochlorothiazide (DIOVAN HCT) 160-12.5 MG tablet Take 1 tablet by mouth daily. 30 tablet 11  . zolpidem (AMBIEN) 10 MG tablet TAKE 1 TABLET BY MOUTH AT BEDTIME AS NEEDED FOR SLEEP 30 tablet 3   No facility-administered medications prior to visit.     ROS Review of Systems  Constitutional: Positive for fatigue. Negative for activity change, appetite change, chills and unexpected weight change.  HENT: Negative for  congestion, mouth sores and sinus pressure.   Eyes: Positive for visual disturbance.  Respiratory: Negative for cough and chest tightness.   Gastrointestinal: Negative for abdominal pain and nausea.  Genitourinary: Negative for difficulty urinating, frequency and vaginal pain.  Musculoskeletal: Positive for arthralgias and back pain. Negative for gait problem.  Skin: Negative for pallor and rash.  Neurological: Negative for dizziness, tremors, weakness, numbness and headaches.  Psychiatric/Behavioral: Negative for confusion and sleep disturbance.    Objective:  BP 124/76 (BP Location: Left Arm, Patient Position: Sitting, Cuff Size: Large)   Pulse 82   Temp 98.5 F (36.9 C) (Oral)   Ht 5\' 5"  (1.651 m)   Wt 255 lb (115.7 kg)   LMP 06/06/2015 Comment: irregular   SpO2 100%   BMI 42.43 kg/m   BP Readings from Last 3 Encounters:  04/08/17 124/76  01/14/17 130/82  10/13/16 132/86    Wt Readings from Last 3 Encounters:  04/08/17 255 lb (115.7 kg)  01/14/17 238 lb (108 kg)  10/13/16 236 lb (107 kg)    Physical Exam  Constitutional: She appears well-developed. No distress.  HENT:  Head: Normocephalic.  Right Ear: External ear normal.  Left Ear: External ear normal.  Nose: Nose normal.  Mouth/Throat: Oropharynx is clear and moist.  Eyes: Conjunctivae are normal. Pupils are equal, round, and reactive to light. Right eye exhibits no discharge. Left eye exhibits no discharge.  Neck: Normal range  of motion. Neck supple. No JVD present. No tracheal deviation present. No thyromegaly present.  Cardiovascular: Normal rate, regular rhythm and normal heart sounds.  Pulmonary/Chest: No stridor. No respiratory distress. She has no wheezes.  Abdominal: Soft. Bowel sounds are normal. She exhibits no distension and no mass. There is no tenderness. There is no rebound and no guarding.  Musculoskeletal: She exhibits tenderness. She exhibits no edema.  Lymphadenopathy:    She has no cervical  adenopathy.  Neurological: She displays normal reflexes. No cranial nerve deficit. She exhibits normal muscle tone. Coordination normal.  Skin: No rash noted. No erythema.  Psychiatric: She has a normal mood and affect. Her behavior is normal. Judgment and thought content normal.  hands - tender w/ROM  Lab Results  Component Value Date   WBC 8.2 01/14/2017   HGB 11.8 (L) 01/14/2017   HCT 36.3 01/14/2017   PLT 258.0 01/14/2017   GLUCOSE 98 01/14/2017   CHOL 155 01/14/2017   TRIG 74.0 01/14/2017   HDL 64.70 01/14/2017   LDLDIRECT 160.7 02/06/2012   LDLCALC 75 01/14/2017   ALT 19 01/14/2017   AST 20 01/14/2017   NA 141 01/14/2017   K 3.8 01/14/2017   CL 107 01/14/2017   CREATININE 0.71 01/14/2017   BUN 11 01/14/2017   CO2 27 01/14/2017   TSH 0.80 01/14/2017    Dg Chest 2 View  Result Date: 08/18/2016 CLINICAL DATA:  Follow-up pneumonia intermittent dyspnea EXAM: CHEST  2 VIEW COMPARISON:  07/14/2016, 11/28/2009 FINDINGS: The right lung is clear. Linear opacity at the lingula and left base, slightly improved. No new consolidation or effusion. Stable cardiomediastinal silhouette. No pneumothorax. IMPRESSION: Improved aeration of the left lung base and lingula. Residual linear opacity at the left base is suggestive of atelectasis or scarring. Electronically Signed   By: Donavan Foil M.D.   On: 08/18/2016 17:04    Assessment & Plan:   There are no diagnoses linked to this encounter. I am having Jessica Lynn maintain her fluticasone, folic acid, dorzolamide-timolol, fluorometholone, valACYclovir, rosuvastatin, Adalimumab, hydrOXYzine, valsartan-hydrochlorothiazide, Vitamin D3, clonazePAM, zolpidem, and HYDROcodone-acetaminophen.  No orders of the defined types were placed in this encounter.    Follow-up: No Follow-up on file.  Walker Kehr, MD

## 2017-04-08 NOTE — Patient Instructions (Signed)
Hold Crestor x 2 weeks due to pains/cramps

## 2017-04-08 NOTE — Assessment & Plan Note (Signed)
Vit D 

## 2017-04-08 NOTE — Assessment & Plan Note (Signed)
Norco prn 

## 2017-04-08 NOTE — Assessment & Plan Note (Signed)
Hold Crestor x 2 weeks due to cramps

## 2017-04-08 NOTE — Assessment & Plan Note (Signed)
Hold Crestor x 2 weeks due to pains/cramps

## 2017-04-08 NOTE — Assessment & Plan Note (Signed)
Off MTX On Humira

## 2017-04-08 NOTE — Assessment & Plan Note (Signed)
Clonazepam prn 

## 2017-05-11 ENCOUNTER — Other Ambulatory Visit: Payer: Self-pay | Admitting: Internal Medicine

## 2017-05-11 DIAGNOSIS — M159 Polyosteoarthritis, unspecified: Secondary | ICD-10-CM

## 2017-05-11 DIAGNOSIS — M150 Primary generalized (osteo)arthritis: Principal | ICD-10-CM

## 2017-05-11 DIAGNOSIS — M8949 Other hypertrophic osteoarthropathy, multiple sites: Secondary | ICD-10-CM

## 2017-05-11 NOTE — Telephone Encounter (Signed)
Check [place] registry last filled 04/13/2017...Jessica Lynn

## 2017-05-11 NOTE — Telephone Encounter (Signed)
Copied from [place] [id number]. Topic: Quick Communication - Rx Refill/Question >> May 11, 2017  8:51 AM Lolita Rieger, RMA wrote: Medication: hydrocodone 5 mg   Has the patient contacted their pharmacy? no   (Agent: If no, request that the patient contact the pharmacy for the refill.)   Preferred Pharmacy (with phone number or street name[facility] [address]   Agent: Please be advised that RX refills may take up to 3 business days. We ask that you follow-up with your pharmacy.

## 2017-05-15 NOTE — Telephone Encounter (Signed)
Patient calling checking status, please advise. Call back [phone]

## 2017-05-15 NOTE — Telephone Encounter (Signed)
Pt is calling to check status of refill. [phone]

## 2017-05-17 MED ORDER — HYDROCODONE-ACETAMINOPHEN 5-325 MG PO TABS: 1.0000 | ORAL_TABLET | Freq: Three times a day (TID) | ORAL | 0 refills | Status: DC | PRN

## 2017-05-17 NOTE — Telephone Encounter (Signed)
I will renew this prescription.  However please let the patient know that she needs to be seen every 3 months by myself for chronic pain management.  Thank you

## 2017-05-18 NOTE — Telephone Encounter (Signed)
Did notify patient.  Patient states [facility] but she will pick up this time from [place].

## 2017-05-18 NOTE — Telephone Encounter (Signed)
Updated pharmacy.../lmb 

## 2017-06-16 ENCOUNTER — Telehealth: Payer: Self-pay | Admitting: Internal Medicine

## 2017-06-16 NOTE — Telephone Encounter (Signed)
Copied from [place] [id number]. Topic: Quick Communication - Rx Refill/Question >> Jun 16, 2017  4:48 PM Wynetta Emery, [place] C wrote: Medication:  HYDROcodone-acetaminophen (NORCO/VICODIN) 5-325 MG tablet    Has the patient contacted their pharmacy? no   (Agent: If no, request that the patient contact the pharmacy for the refill.)   Preferred Pharmacy (with phone number or street name): [id number] [place], [place] - [place] [phone] (Phone) [phone] (Fax)     Agent: Please be advised that RX refills may take up to 3 business days. We ask that you follow-up with your pharmacy.

## 2017-06-17 ENCOUNTER — Other Ambulatory Visit: Payer: Self-pay | Admitting: Internal Medicine

## 2017-06-17 DIAGNOSIS — M159 Polyosteoarthritis, unspecified: Secondary | ICD-10-CM

## 2017-06-17 DIAGNOSIS — M8949 Other hypertrophic osteoarthropathy, multiple sites: Secondary | ICD-10-CM

## 2017-06-17 DIAGNOSIS — M150 Primary generalized (osteo)arthritis: Principal | ICD-10-CM

## 2017-06-17 MED ORDER — HYDROCODONE-ACETAMINOPHEN 5-325 MG PO TABS: 1.0000 | ORAL_TABLET | Freq: Three times a day (TID) | ORAL | 0 refills | Status: DC | PRN

## 2017-06-17 NOTE — Telephone Encounter (Signed)
Check [place] registry last filled 05/17/2017. MD is out of the office pls advise on refill.Marland KitchenJohny Lynn

## 2017-06-17 NOTE — Telephone Encounter (Signed)
Called pt no answer LMOM rx has been sent to [facility].../lmb  

## 2017-06-17 NOTE — Telephone Encounter (Signed)
Hydrocodone-acetaminophen 5-325 LOV: 04/18/17 PCP: Dr Alain Marion [facility] [address] [place], [place]

## 2017-07-08 ENCOUNTER — Ambulatory Visit: Payer: BC Managed Care – PPO | Admitting: Internal Medicine

## 2017-07-08 ENCOUNTER — Encounter: Payer: Self-pay | Admitting: Internal Medicine

## 2017-07-08 DIAGNOSIS — E559 Vitamin D deficiency, unspecified: Secondary | ICD-10-CM | POA: Diagnosis not present

## 2017-07-08 DIAGNOSIS — L299 Pruritus, unspecified: Secondary | ICD-10-CM

## 2017-07-08 DIAGNOSIS — G4700 Insomnia, unspecified: Secondary | ICD-10-CM | POA: Diagnosis not present

## 2017-07-08 DIAGNOSIS — H3093 Unspecified chorioretinal inflammation, bilateral: Secondary | ICD-10-CM

## 2017-07-08 DIAGNOSIS — M8949 Other hypertrophic osteoarthropathy, multiple sites: Secondary | ICD-10-CM

## 2017-07-08 DIAGNOSIS — F411 Generalized anxiety disorder: Secondary | ICD-10-CM | POA: Diagnosis not present

## 2017-07-08 DIAGNOSIS — M545 Low back pain, unspecified: Secondary | ICD-10-CM

## 2017-07-08 DIAGNOSIS — M150 Primary generalized (osteo)arthritis: Secondary | ICD-10-CM | POA: Diagnosis not present

## 2017-07-08 DIAGNOSIS — M159 Polyosteoarthritis, unspecified: Secondary | ICD-10-CM

## 2017-07-08 DIAGNOSIS — H35063 Retinal vasculitis, bilateral: Secondary | ICD-10-CM | POA: Diagnosis not present

## 2017-07-08 DIAGNOSIS — I10 Essential (primary) hypertension: Secondary | ICD-10-CM | POA: Diagnosis not present

## 2017-07-08 MED ORDER — HYDROXYZINE HCL 25 MG PO TABS: 25.0000 mg | ORAL_TABLET | Freq: Three times a day (TID) | ORAL | 3 refills | Status: DC | PRN

## 2017-07-08 MED ORDER — ZOLPIDEM TARTRATE 10 MG PO TABS: ORAL_TABLET | ORAL | 3 refills | Status: DC

## 2017-07-08 MED ORDER — HYDROCODONE-ACETAMINOPHEN 5-325 MG PO TABS: 1.0000 | ORAL_TABLET | Freq: Three times a day (TID) | ORAL | 0 refills | Status: DC | PRN

## 2017-07-08 NOTE — Assessment & Plan Note (Signed)
Valsartan HCT 

## 2017-07-08 NOTE — Assessment & Plan Note (Signed)
Clonazepam prn Not to take w/Norco  Potential benefits of a long term benzodiazepines  use as well as potential risks  and complications were explained to the patient and were aknowledged.  

## 2017-07-08 NOTE — Assessment & Plan Note (Signed)
Humira 

## 2017-07-08 NOTE — Assessment & Plan Note (Signed)
Chronic  Zolpidem Not to take w/Norco  Potential benefits of a long term benzodiazepines  use as well as potential risks  and complications were explained to the patient and were aknowledged.

## 2017-07-08 NOTE — Assessment & Plan Note (Signed)
Chronic Hydroxyzine

## 2017-07-08 NOTE — Progress Notes (Signed)
Subjective:  Patient ID: Jessica Lynn, female    DOB: [birth year]  Age: 57 y.o. MRN: [id number]  CC: No chief complaint on file.   HPI KANYAH MATSUSHIMA presents for vasculitis, chronic pain, anxiety, itching, insomnia f/u.   Outpatient Medications Prior to Visit  Medication Sig Dispense Refill  . Adalimumab 40 MG/0.8ML PNKT Inject 40 mg into the skin.    . Cholecalciferol (VITAMIN D3) 2000 units capsule Take 1 capsule (2,000 Units total) by mouth daily. 100 capsule 3  . clonazePAM (KLONOPIN) 1 MG tablet Take 1 tablet (1 mg total) by mouth 2 (two) times daily. 60 tablet 2  . dorzolamide-timolol (COSOPT) 22.3-6.8 MG/ML ophthalmic solution Place 1 drop into both eyes 2 (two) times daily. 10 mL 12  . fluticasone (FLONASE) 50 MCG/ACT nasal spray 2 sprays in each nostril two times daily 16 g 6  . folic acid (FOLVITE) 1 MG tablet Take 1 mg by mouth daily.    [name] HYDROcodone-acetaminophen (NORCO/VICODIN) 5-325 MG tablet Take 1 tablet by mouth 3 (three) times daily as needed for moderate pain. 90 tablet 0  . hydrOXYzine (ATARAX/VISTARIL) 10 MG tablet TAKE ONE TABLET BY MOUTH THREE TIMES DAILY AS NEEDED 90 tablet 0  . rosuvastatin (CRESTOR) 20 MG tablet Take 1 tablet (20 mg total) by mouth daily. 30 tablet 11  . valACYclovir (VALTREX) 1000 MG tablet Take 1 tablet (1,000 mg total) by mouth 3 (three) times daily. 21 tablet 0  . valsartan-hydrochlorothiazide (DIOVAN HCT) 160-12.5 MG tablet Take 1 tablet by mouth daily. 30 tablet 11  . zolpidem (AMBIEN) 10 MG tablet TAKE 1 TABLET BY MOUTH AT BEDTIME AS NEEDED FOR SLEEP 30 tablet 3  . fluorometholone (FML) 0.1 % ophthalmic suspension Place 1 drop into both eyes 4 (four) times daily. (Patient not taking: Reported on 07/08/2017) 5 mL 0   No facility-administered medications prior to visit.     ROS Review of Systems  Constitutional: Positive for fatigue. Negative for activity change, appetite change, chills and unexpected weight change.  HENT: Negative  for congestion, mouth sores and sinus pressure.   Eyes: Negative for visual disturbance.  Respiratory: Negative for cough and chest tightness.   Gastrointestinal: Negative for abdominal pain and nausea.  Genitourinary: Negative for difficulty urinating, frequency and vaginal pain.  Musculoskeletal: Positive for arthralgias, back pain and gait problem.  Skin: Negative for pallor and rash.  Neurological: Positive for weakness. Negative for dizziness, tremors, numbness and headaches.  Psychiatric/Behavioral: Negative for confusion and sleep disturbance.    Objective:  BP 122/90 (BP Location: Left Arm, Patient Position: Sitting, Cuff Size: Large)   Pulse 79   Temp (!) 97.5 F (36.4 C) (Oral)   Ht 5\' 5"  (1.651 m)   Wt 259 lb (117.5 kg)   LMP 06/06/2015 Comment: irregular   SpO2 97%   BMI 43.10 kg/m   BP Readings from Last 3 Encounters:  07/08/17 122/90  04/08/17 124/76  01/14/17 130/82    Wt Readings from Last 3 Encounters:  07/08/17 259 lb (117.5 kg)  04/08/17 255 lb (115.7 kg)  01/14/17 238 lb (108 kg)    Physical Exam  Constitutional: She appears well-developed. No distress.  HENT:  Head: Normocephalic.  Right Ear: External ear normal.  Left Ear: External ear normal.  Nose: Nose normal.  Mouth/Throat: Oropharynx is clear and moist.  Eyes: Pupils are equal, round, and reactive to light. Conjunctivae are normal. Right eye exhibits no discharge. Left eye exhibits no discharge.  Neck: Normal range  of motion. Neck supple. No JVD present. No tracheal deviation present. No thyromegaly present.  Cardiovascular: Normal rate, regular rhythm and normal heart sounds.  Pulmonary/Chest: No stridor. No respiratory distress. She has no wheezes.  Abdominal: Soft. Bowel sounds are normal. She exhibits no distension and no mass. There is no tenderness. There is no rebound and no guarding.  Musculoskeletal: She exhibits tenderness. She exhibits no edema.  Lymphadenopathy:    She has no  cervical adenopathy.  Neurological: She displays normal reflexes. No cranial nerve deficit. She exhibits normal muscle tone. Coordination normal.  Skin: No rash noted. No erythema.  Psychiatric: She has a normal mood and affect. Her behavior is normal. Judgment and thought content normal.  obese R knee tender LS tender  Lab Results  Component Value Date   WBC 8.2 01/14/2017   HGB 11.8 (L) 01/14/2017   HCT 36.3 01/14/2017   PLT 258.0 01/14/2017   GLUCOSE 98 01/14/2017   CHOL 155 01/14/2017   TRIG 74.0 01/14/2017   HDL 64.70 01/14/2017   LDLDIRECT 160.7 02/06/2012   LDLCALC 75 01/14/2017   ALT 19 01/14/2017   AST 20 01/14/2017   NA 141 01/14/2017   K 3.8 01/14/2017   CL 107 01/14/2017   CREATININE 0.71 01/14/2017   BUN 11 01/14/2017   CO2 27 01/14/2017   TSH 0.80 01/14/2017    Dg Chest 2 View  Result Date: 08/18/2016 CLINICAL DATA:  Follow-up pneumonia intermittent dyspnea EXAM: CHEST  2 VIEW COMPARISON:  07/14/2016, 11/28/2009 FINDINGS: The right lung is clear. Linear opacity at the lingula and left base, slightly improved. No new consolidation or effusion. Stable cardiomediastinal silhouette. No pneumothorax. IMPRESSION: Improved aeration of the left lung base and lingula. Residual linear opacity at the left base is suggestive of atelectasis or scarring. Electronically Signed   By: Donavan Foil M.D.   On: 08/18/2016 17:04    Assessment & Plan:   There are no diagnoses linked to this encounter. I am having Jessica Lynn maintain her fluticasone, folic acid, dorzolamide-timolol, fluorometholone, valACYclovir, rosuvastatin, Adalimumab, hydrOXYzine, valsartan-hydrochlorothiazide, Vitamin D3, zolpidem, clonazePAM, and HYDROcodone-acetaminophen.  No orders of the defined types were placed in this encounter.    Follow-up: No follow-ups on file.  Walker Kehr, MD

## 2017-07-08 NOTE — Assessment & Plan Note (Signed)
Vit D 

## 2017-07-08 NOTE — Assessment & Plan Note (Signed)
On Humira 

## 2017-07-08 NOTE — Assessment & Plan Note (Signed)
Norco prn  Potential benefits of a long term opioids use as well as potential risks (i.e. addiction risk, apnea etc) and complications (i.e. Somnolence, constipation and others) were explained to the patient and were aknowledged. 

## 2017-07-31 ENCOUNTER — Other Ambulatory Visit: Payer: Self-pay | Admitting: Obstetrics

## 2017-07-31 DIAGNOSIS — Z1231 Encounter for screening mammogram for malignant neoplasm of breast: Secondary | ICD-10-CM

## 2017-08-04 ENCOUNTER — Ambulatory Visit
Admission: RE | Admit: 2017-08-04 | Discharge: 2017-08-04 | Disposition: A | Discharge status: Home / Self Care | Payer: BC Managed Care – PPO | Source: Ambulatory Visit | Attending: Obstetrics | Admitting: Obstetrics

## 2017-08-04 DIAGNOSIS — Z1231 Encounter for screening mammogram for malignant neoplasm of breast: Secondary | ICD-10-CM

## 2017-08-10 ENCOUNTER — Telehealth: Payer: Self-pay | Admitting: Internal Medicine

## 2017-08-10 DIAGNOSIS — M150 Primary generalized (osteo)arthritis: Principal | ICD-10-CM

## 2017-08-10 DIAGNOSIS — M8949 Other hypertrophic osteoarthropathy, multiple sites: Secondary | ICD-10-CM

## 2017-08-10 DIAGNOSIS — M159 Polyosteoarthritis, unspecified: Secondary | ICD-10-CM

## 2017-08-10 NOTE — Telephone Encounter (Signed)
Copied from [place] [id number]. Topic: Quick Communication - Rx Refill/Question >> Aug 10, 2017 12:03 PM Yvette Rack wrote: Medication: clonazePAM (KLONOPIN) 1 MG tablet  HYDROcodone-acetaminophen (NORCO/VICODIN) 5-325 MG tablet   Has the patient contacted their pharmacy? Yes.   (Agent: If no, request that the patient contact the pharmacy for the refill.) Preferred Pharmacy (with phone number or street name): [id number] [place], [place] - [place] [phone] (Phone) [phone] (Fax)     Agent: Please be advised that RX refills may take up to 3 business days. We ask that you follow-up with your pharmacy.

## 2017-08-11 MED ORDER — HYDROCODONE-ACETAMINOPHEN 5-325 MG PO TABS: 1.0000 | ORAL_TABLET | Freq: Three times a day (TID) | ORAL | 0 refills | Status: DC | PRN

## 2017-08-11 MED ORDER — CLONAZEPAM 1 MG PO TABS: 1.0000 mg | ORAL_TABLET | Freq: Two times a day (BID) | ORAL | 2 refills | Status: DC

## 2017-08-11 NOTE — Telephone Encounter (Signed)
clonazepam refill Last OV: 07/08/17 Last Refill:04/08/17 #60 tab  [facility] [address] [place]  Hydrocodone-acetaminophen refill Last OV: 07/08/17 Last Refill:07/08/17 (end date 07/08/2018)

## 2017-08-12 NOTE — Telephone Encounter (Signed)
Pt called and states the pharmacy in [place] did not have the medication of Klonopin, medication sent to wrong pharmacy, call pharmacy or pt to advise

## 2017-08-13 MED ORDER — CLONAZEPAM 1 MG PO TABS: 1.0000 mg | ORAL_TABLET | Freq: Two times a day (BID) | ORAL | 2 refills | Status: DC

## 2017-08-13 NOTE — Telephone Encounter (Signed)
Notified pt MD miss and [facility]. [facility] spoke w/rep cancel script that was sent. [facility] had to leave on pharmacy vm. Updated ,ed list../lmb

## 2017-08-13 NOTE — Addendum Note (Signed)
Addended by: Earnstine Regal on: 08/13/2017 09:15 AM   Modules accepted: Orders

## 2017-09-10 ENCOUNTER — Other Ambulatory Visit: Payer: Self-pay | Admitting: Internal Medicine

## 2017-09-10 DIAGNOSIS — M150 Primary generalized (osteo)arthritis: Principal | ICD-10-CM

## 2017-09-10 DIAGNOSIS — M8949 Other hypertrophic osteoarthropathy, multiple sites: Secondary | ICD-10-CM

## 2017-09-10 DIAGNOSIS — M159 Polyosteoarthritis, unspecified: Secondary | ICD-10-CM

## 2017-09-10 NOTE — Telephone Encounter (Signed)
Hydrocodone refill Last Refill:08/11/17 # 90 Last OV: 07/08/17 PCP: Dr. Alain Marion [facility] [place] [facility] [address]

## 2017-09-10 NOTE — Telephone Encounter (Signed)
Copied from [place] [id number]. Topic: Quick Communication - Rx Refill/Question >> Sep 10, 2017 12:00 PM Synthia Innocent wrote: Medication: HYDROcodone-acetaminophen (NORCO/VICODIN) 5-325 MG tablet  Has the patient contacted their pharmacy? Yes. Told to contact office (Agent: If no, request that the patient contact the pharmacy for the refill.) (Agent: If yes, when and what did the pharmacy advise?)  Preferred Pharmacy (with phone number or street name): [facility]: Please be advised that RX refills may take up to 3 business days. We ask that you follow-up with your pharmacy.

## 2017-09-11 NOTE — Telephone Encounter (Signed)
Checked controlled substance database, last filled 08/12/17.

## 2017-09-14 MED ORDER — HYDROCODONE-ACETAMINOPHEN 5-325 MG PO TABS: 1.0000 | ORAL_TABLET | Freq: Three times a day (TID) | ORAL | 0 refills | Status: DC | PRN

## 2017-10-05 ENCOUNTER — Ambulatory Visit (INDEPENDENT_AMBULATORY_CARE_PROVIDER_SITE_OTHER): Payer: BC Managed Care – PPO | Admitting: Obstetrics & Gynecology

## 2017-10-05 ENCOUNTER — Encounter: Payer: Self-pay | Admitting: Obstetrics & Gynecology

## 2017-10-05 VITALS — BP 141/78 | HR 72 | Ht 65.5 in | Wt 270.8 lb

## 2017-10-05 DIAGNOSIS — Z01419 Encounter for gynecological examination (general) (routine) without abnormal findings: Secondary | ICD-10-CM | POA: Diagnosis not present

## 2017-10-05 DIAGNOSIS — Z1151 Encounter for screening for human papillomavirus (HPV): Secondary | ICD-10-CM | POA: Diagnosis not present

## 2017-10-05 DIAGNOSIS — E6609 Other obesity due to excess calories: Secondary | ICD-10-CM

## 2017-10-05 DIAGNOSIS — I10 Essential (primary) hypertension: Secondary | ICD-10-CM

## 2017-10-05 DIAGNOSIS — R009 Unspecified abnormalities of heart beat: Secondary | ICD-10-CM

## 2017-10-05 DIAGNOSIS — Z124 Encounter for screening for malignant neoplasm of cervix: Secondary | ICD-10-CM

## 2017-10-05 NOTE — Progress Notes (Signed)
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

## 2017-10-05 NOTE — Patient Instructions (Signed)
Menopause Menopause is the normal time of life when menstrual periods stop completely. Menopause is complete when you have missed 12 consecutive menstrual periods. It usually occurs between the ages of 48 years and 55 years. Very rarely does a woman develop menopause before the age of 40 years. At menopause, your ovaries stop producing the female hormones estrogen and progesterone. This can cause undesirable symptoms and also affect your health. Sometimes the symptoms may occur 4-5 years before the menopause begins. There is no relationship between menopause and:  Oral contraceptives.  Number of children you had.  Race.  The age your menstrual periods started (menarche).  Heavy smokers and very thin women may develop menopause earlier in life. What are the causes?  The ovaries stop producing the female hormones estrogen and progesterone. Other causes include:  Surgery to remove both ovaries.  The ovaries stop functioning for no known reason.  Tumors of the pituitary gland in the brain.  Medical disease that affects the ovaries and hormone production.  Radiation treatment to the abdomen or pelvis.  Chemotherapy that affects the ovaries.  What are the signs or symptoms?  Hot flashes.  Night sweats.  Decrease in sex drive.  Vaginal dryness and thinning of the vagina causing painful intercourse.  Dryness of the skin and developing wrinkles.  Headaches.  Tiredness.  Irritability.  Memory problems.  Weight gain.  Bladder infections.  Hair growth of the face and chest.  Infertility. More serious symptoms include:  Loss of bone (osteoporosis) causing breaks (fractures).  Depression.  Hardening and narrowing of the arteries (atherosclerosis) causing heart attacks and strokes.  How is this diagnosed?  When the menstrual periods have stopped for 12 straight months.  Physical exam.  Hormone studies of the blood. How is this treated? There are many treatment  choices and nearly as many questions about them. The decisions to treat or not to treat menopausal changes is an individual choice made with your health care provider. Your health care provider can discuss the treatments with you. Together, you can decide which treatment will work best for you. Your treatment choices may include:  Hormone therapy (estrogen and progesterone).  Non-hormonal medicines.  Treating the individual symptoms with medicine (for example antidepressants for depression).  Herbal medicines that may help specific symptoms.  Counseling by a psychiatrist or psychologist.  Group therapy.  Lifestyle changes including: ? Eating healthy. ? Regular exercise. ? Limiting caffeine and alcohol. ? Stress management and meditation.  No treatment.  Follow these instructions at home:  Take the medicine your health care provider gives you as directed.  Get plenty of sleep and rest.  Exercise regularly.  Eat a diet that contains calcium (good for the bones) and soy products (acts like estrogen hormone).  Avoid alcoholic beverages.  Do not smoke.  If you have hot flashes, dress in layers.  Take supplements, calcium, and vitamin D to strengthen bones.  You can use over-the-counter lubricants or moisturizers for vaginal dryness.  Group therapy is sometimes very helpful.  Acupuncture may be helpful in some cases. Contact a health care provider if:  You are not sure you are in menopause.  You are having menopausal symptoms and need advice and treatment.  You are still having menstrual periods after age 55 years.  You have pain with intercourse.  Menopause is complete (no menstrual period for 12 months) and you develop vaginal bleeding.  You need a referral to a specialist (gynecologist, psychiatrist, or psychologist) for treatment. Get help right   away if:  You have severe depression.  You have excessive vaginal bleeding.  You fell and think you have a  broken bone.  You have pain when you urinate.  You develop leg or chest pain.  You have a fast pounding heart beat (palpitations).  You have severe headaches.  You develop vision problems.  You feel a lump in your breast.  You have abdominal pain or severe indigestion. This information is not intended to replace advice given to you by your health care provider. Make sure you discuss any questions you have with your health care provider. Document Released: 06/14/2003 Document Revised: 08/30/2015 Document Reviewed: 10/21/2012 Elsevier Interactive Patient Education  2017 Elsevier Inc.  

## 2017-10-05 NOTE — Progress Notes (Signed)
Subjective:     Jessica Lynn is a 57 y.o. female here for a routine exam.  [id number]. S/p SVD x2. Pt reports recurrent SABs- unknown dates and times.  LMP 2 years prev. Was followed by Dr. Ruthann Lynn. i Current complaints: vasomotor sx that are bad. Pt reports that she also gets cold. She 'just deal with.'   Drinks a lot of soda. Can drink a 2 L Pepsi daily.     Gynecologic History Patient's last menstrual period was 06/06/2015. Contraception: post menopausal status Last Pap: 2017. Results were: normal Last mammogram: 08/04/2017. Results were: normal  Obstetric History OB History  Gravida Para Term Preterm AB Living  2 2 2     2   SAB TAB Ectopic Multiple Live Births          2    # Outcome Date GA Lbr Len/2nd Weight Sex Delivery Anes PTL Lv  2 Term 1991 [redacted]w[redacted]d   M Vag-Spont   LIV  1 Term 49 [redacted]w[redacted]d   F Vag-Spont  N LIV   The following portions of the patient's history were reviewed and updated as appropriate: allergies, current medications, past family history, past medical history, past social history, past surgical history and problem list.  Review of Systems Pertinent items are noted in HPI.    Objective:  BP (!) 141/78   Pulse 72   Ht 5' 5.5" (1.664 m)   Wt 270 lb 12.8 oz (122.8 kg)   LMP 06/06/2015 Comment: irregular   BMI 44.38 kg/m   General Appearance:    Alert, cooperative, no distress, appears stated age  Head:    Normocephalic, without obvious abnormality, atraumatic  Eyes:    conjunctiva/corneas clear, EOM's intact, both eyes  Ears:    Normal external ear canals, both ears  Nose:   Nares normal, septum midline, mucosa normal, no drainage    or sinus tenderness  Throat:   Lips, mucosa, and tongue normal; teeth and gums normal  Neck:   Supple, symmetrical, trachea midline, no adenopathy;    thyroid:  no enlargement/tenderness/nodules  Back:     Symmetric, no curvature, ROM normal, no CVA tenderness  Lungs:     Clear to auscultation bilaterally, respirations  unlabored  Chest Wall:    No tenderness or deformity   Heart:    Regular rate and rhythm, S1 and S2 normal, no murmur, rub   or gallop  Breast Exam:    No tenderness, masses, or nipple abnormality  Abdomen:     Soft, non-tender, bowel sounds active all four quadrants,    no masses, no organomegaly  Genitalia:    Normal female without lesion, discharge or tenderness; uterus palpated. Does not appear to be enlarged. No adnexal masses but, this exam is limited by pts body habitus.      Extremities:   Extremities normal, atraumatic, no cyanosis or edema  Pulses:   2+ and symmetric all extremities  Skin:   Skin color, texture, turgor normal, no rashes or lesions    Assessment:    Healthy female exam.   Menopausal state- handout given on Menopause.  Rec dressing in layers. F/u if menopausal sx worsen.  Obesity- pt reports that she is limiting sodas which is her biggest issue.   Elevated blood pressure- pt has a dx of HTN. She is on meds and reports that  she is taking them- followed by primary care.      Plan:    Follow up in: 1 year.  F/u PAP and hrHPV rec decrease or eliminate soda with sugar or sugar substitutes.   Jessica Lynn, M.D., Jessica Lynn

## 2017-10-06 LAB — CYTOLOGY - PAP
Diagnosis: NEGATIVE
HPV (WINDOPATH): NOT DETECTED

## 2017-10-07 ENCOUNTER — Encounter: Payer: Self-pay | Admitting: Internal Medicine

## 2017-10-07 ENCOUNTER — Ambulatory Visit: Payer: BC Managed Care – PPO | Admitting: Internal Medicine

## 2017-10-07 VITALS — BP 128/90 | HR 74 | Ht 65.5 in | Wt 270.0 lb

## 2017-10-07 DIAGNOSIS — M545 Low back pain, unspecified: Secondary | ICD-10-CM

## 2017-10-07 DIAGNOSIS — M150 Primary generalized (osteo)arthritis: Secondary | ICD-10-CM | POA: Diagnosis not present

## 2017-10-07 DIAGNOSIS — M159 Polyosteoarthritis, unspecified: Secondary | ICD-10-CM

## 2017-10-07 DIAGNOSIS — E559 Vitamin D deficiency, unspecified: Secondary | ICD-10-CM | POA: Diagnosis not present

## 2017-10-07 DIAGNOSIS — G5603 Carpal tunnel syndrome, bilateral upper limbs: Secondary | ICD-10-CM | POA: Diagnosis not present

## 2017-10-07 DIAGNOSIS — F411 Generalized anxiety disorder: Secondary | ICD-10-CM

## 2017-10-07 DIAGNOSIS — I10 Essential (primary) hypertension: Secondary | ICD-10-CM

## 2017-10-07 DIAGNOSIS — Z23 Encounter for immunization: Secondary | ICD-10-CM | POA: Diagnosis not present

## 2017-10-07 DIAGNOSIS — M8949 Other hypertrophic osteoarthropathy, multiple sites: Secondary | ICD-10-CM

## 2017-10-07 MED ORDER — HYDROCODONE-ACETAMINOPHEN 5-325 MG PO TABS: 1.0000 | ORAL_TABLET | Freq: Three times a day (TID) | ORAL | 0 refills | Status: DC | PRN

## 2017-10-07 MED ORDER — CLONAZEPAM 1 MG PO TABS: 1.0000 mg | ORAL_TABLET | Freq: Two times a day (BID) | ORAL | 2 refills | Status: DC

## 2017-10-07 MED ORDER — ZOLPIDEM TARTRATE 10 MG PO TABS: ORAL_TABLET | ORAL | 3 refills | Status: DC

## 2017-10-07 NOTE — Addendum Note (Signed)
Addended by: Cresenciano Lick on: 10/07/2017 02:39 PM   Modules accepted: Orders

## 2017-10-07 NOTE — Progress Notes (Signed)
Subjective:  Patient ID: Jessica Lynn, female    DOB: 1960/12/13  Age: 57 y.o. MRN: [id number]  CC: No chief complaint on file.   HPI Jessica Lynn presents for chronic pain, anxiety, HTN f/u C/o B hand numbness L>R fingers 1-2-3 worse; holding babies  Outpatient Medications Prior to Visit  Medication Sig Dispense Refill  . Adalimumab 40 MG/0.8ML PNKT Inject 40 mg into the skin.    . Cholecalciferol (VITAMIN D3) 2000 units capsule Take 1 capsule (2,000 Units total) by mouth daily. 100 capsule 3  . clonazePAM (KLONOPIN) 1 MG tablet Take 1 tablet (1 mg total) by mouth 2 (two) times daily. 60 tablet 2  . cycloSPORINE modified (NEORAL) 50 MG capsule Take 1 capsule by mouth 2 (two) times daily.    . dorzolamide-timolol (COSOPT) 22.3-6.8 MG/ML ophthalmic solution Place 1 drop into both eyes 2 (two) times daily. 10 mL 12  . fluorometholone (FML) 0.1 % ophthalmic suspension Place 1 drop into both eyes 4 (four) times daily. 5 mL 0  . fluticasone (FLONASE) 50 MCG/ACT nasal spray 2 sprays in each nostril two times daily 16 g 6  . folic acid (FOLVITE) 1 MG tablet Take 1 mg by mouth daily.    [name] HYDROcodone-acetaminophen (NORCO/VICODIN) 5-325 MG tablet Take 1 tablet by mouth 3 (three) times daily as needed for moderate pain. 90 tablet 0  . hydrOXYzine (ATARAX/VISTARIL) 25 MG tablet Take 1 tablet (25 mg total) by mouth every 8 (eight) hours as needed for itching. 90 tablet 3  . mycophenolate (CELLCEPT) 500 MG tablet Take by mouth.    . rosuvastatin (CRESTOR) 20 MG tablet Take 1 tablet (20 mg total) by mouth daily. 30 tablet 11  . valACYclovir (VALTREX) 1000 MG tablet Take 1 tablet (1,000 mg total) by mouth 3 (three) times daily. 21 tablet 0  . valsartan-hydrochlorothiazide (DIOVAN HCT) 160-12.5 MG tablet Take 1 tablet by mouth daily. 30 tablet 11  . zolpidem (AMBIEN) 10 MG tablet TAKE 1 TABLET BY MOUTH AT BEDTIME AS NEEDED FOR SLEEP 30 tablet 3   No facility-administered medications prior to visit.      ROS: Review of Systems  Constitutional: Negative for activity change, appetite change, chills, fatigue and unexpected weight change.  HENT: Negative for congestion, mouth sores and sinus pressure.   Eyes: Negative for visual disturbance.  Respiratory: Negative for cough and chest tightness.   Gastrointestinal: Negative for abdominal pain and nausea.  Genitourinary: Negative for difficulty urinating, frequency and vaginal pain.  Musculoskeletal: Negative for back pain and gait problem.  Skin: Negative for pallor and rash.  Neurological: Positive for numbness. Negative for dizziness, tremors, weakness and headaches.  Psychiatric/Behavioral: Negative for confusion and sleep disturbance.    Objective:  BP 128/90 (BP Location: Left Arm, Patient Position: Sitting, Cuff Size: Large)   Pulse 74   Ht 5' 5.5" (1.664 m)   Wt 270 lb (122.5 kg)   LMP 06/06/2015 Comment: irregular   SpO2 97%   BMI 44.25 kg/m   BP Readings from Last 3 Encounters:  10/07/17 128/90  10/05/17 (!) 141/78  07/08/17 122/90    Wt Readings from Last 3 Encounters:  10/07/17 270 lb (122.5 kg)  10/05/17 270 lb 12.8 oz (122.8 kg)  07/08/17 259 lb (117.5 kg)    Physical Exam  Constitutional: She appears well-developed. No distress.  HENT:  Head: Normocephalic.  Right Ear: External ear normal.  Left Ear: External ear normal.  Nose: Nose normal.  Mouth/Throat: Oropharynx is clear  and moist.  Eyes: Pupils are equal, round, and reactive to light. Conjunctivae are normal. Right eye exhibits no discharge. Left eye exhibits no discharge.  Neck: Normal range of motion. Neck supple. No JVD present. No tracheal deviation present. No thyromegaly present.  Cardiovascular: Normal rate, regular rhythm and normal heart sounds.  Pulmonary/Chest: No stridor. No respiratory distress. She has no wheezes.  Abdominal: Soft. Bowel sounds are normal. She exhibits no distension and no mass. There is no tenderness. There is no  rebound and no guarding.  Musculoskeletal: She exhibits no edema or tenderness.  Lymphadenopathy:    She has no cervical adenopathy.  Neurological: She displays normal reflexes. No cranial nerve deficit. She exhibits normal muscle tone. Coordination normal.  Skin: No rash noted. No erythema.  Psychiatric: She has a normal mood and affect. Her behavior is normal. Judgment and thought content normal.    Lab Results  Component Value Date   WBC 8.2 01/14/2017   HGB 11.8 (L) 01/14/2017   HCT 36.3 01/14/2017   PLT 258.0 01/14/2017   GLUCOSE 98 01/14/2017   CHOL 155 01/14/2017   TRIG 74.0 01/14/2017   HDL 64.70 01/14/2017   LDLDIRECT 160.7 02/06/2012   LDLCALC 75 01/14/2017   ALT 19 01/14/2017   AST 20 01/14/2017   NA 141 01/14/2017   K 3.8 01/14/2017   CL 107 01/14/2017   CREATININE 0.71 01/14/2017   BUN 11 01/14/2017   CO2 27 01/14/2017   TSH 0.80 01/14/2017    Mm 3d Screen Breast Bilateral  Result Date: 08/05/2017 CLINICAL DATA:  Screening. EXAM: DIGITAL SCREENING BILATERAL MAMMOGRAM WITH TOMO AND CAD COMPARISON:  Previous exam(s). ACR Breast Density Category b: There are scattered areas of fibroglandular density. FINDINGS: There are no findings suspicious for malignancy. Images were processed with CAD. IMPRESSION: No mammographic evidence of malignancy. A result letter of this screening mammogram will be mailed directly to the patient. RECOMMENDATION: Screening mammogram in one year. (Code:SM-B-01Y) BI-RADS CATEGORY  1: Negative. Electronically Signed   By: Abelardo Diesel M.D.   On: 08/05/2017 09:19    Assessment & Plan:   There are no diagnoses linked to this encounter.   No orders of the defined types were placed in this encounter.    Follow-up: No follow-ups on file.  Walker Kehr, MD

## 2017-10-07 NOTE — Assessment & Plan Note (Signed)
Clonazepam prn Not to take w/Norco  Potential benefits of a long term benzodiazepines  use as well as potential risks  and complications were explained to the patient and were aknowledged.  

## 2017-10-07 NOTE — Assessment & Plan Note (Signed)
Vit D 

## 2017-10-07 NOTE — Assessment & Plan Note (Signed)
Valsartan HCT Risks associated with treatment noncompliance were discussed. Compliance was encouraged.

## 2017-10-07 NOTE — Assessment & Plan Note (Signed)
Norco prn  Potential benefits of a long term opioids use as well as potential risks (i.e. addiction risk, apnea etc) and complications (i.e. Somnolence, constipation and others) were explained to the patient and were aknowledged. 

## 2017-10-07 NOTE — Assessment & Plan Note (Signed)
Splints Info Options discussed

## 2017-10-07 NOTE — Patient Instructions (Signed)
Carpal Tunnel Syndrome Carpal tunnel syndrome is a condition that causes pain in your hand and arm. The carpal tunnel is a narrow area that is on the palm side of your wrist. Repeated wrist motion or certain diseases may cause swelling in the tunnel. This swelling can pinch the main nerve in the wrist (median nerve). Follow these instructions at home: If you have a splint:  Wear it as told by your doctor. Remove it only as told by your doctor.  Loosen the splint if your fingers: ? Become numb and tingle. ? Turn blue and cold.  Keep the splint clean and dry. General instructions  Take over-the-counter and prescription medicines only as told by your doctor.  Rest your wrist from any activity that may be causing your pain. If needed, talk to your employer about changes that can be made in your work, such as getting a wrist pad to use while typing.  If directed, apply ice to the painful area: ? Put ice in a plastic bag. ? Place a towel between your skin and the bag. ? Leave the ice on for 20 minutes, 2-3 times per day.  Keep all follow-up visits as told by your doctor. This is important.  Do any exercises as told by your doctor, physical therapist, or occupational therapist. Contact a doctor if:  You have new symptoms.  Medicine does not help your pain.  Your symptoms get worse. This information is not intended to replace advice given to you by your health care provider. Make sure you discuss any questions you have with your health care provider. Document Released: 03/13/2011 Document Revised: 08/30/2015 Document Reviewed: 08/09/2014 Elsevier Interactive Patient Education  2018 Elsevier Inc.  

## 2017-11-02 ENCOUNTER — Other Ambulatory Visit: Payer: Self-pay | Admitting: *Deleted

## 2017-11-02 MED ORDER — VALSARTAN-HYDROCHLOROTHIAZIDE 160-12.5 MG PO TABS: 1.0000 | ORAL_TABLET | Freq: Every day | ORAL | 11 refills | Status: DC

## 2017-11-08 ENCOUNTER — Other Ambulatory Visit: Payer: Self-pay | Admitting: Internal Medicine

## 2017-11-09 ENCOUNTER — Telehealth: Payer: Self-pay | Admitting: Internal Medicine

## 2017-11-09 DIAGNOSIS — M8949 Other hypertrophic osteoarthropathy, multiple sites: Secondary | ICD-10-CM

## 2017-11-09 DIAGNOSIS — M150 Primary generalized (osteo)arthritis: Principal | ICD-10-CM

## 2017-11-09 DIAGNOSIS — M159 Polyosteoarthritis, unspecified: Secondary | ICD-10-CM

## 2017-11-09 NOTE — Telephone Encounter (Signed)
Copied from [place] [id number]. Topic: Quick Communication - Rx Refill/Question >> Nov 09, 2017  1:14 PM Jessica Lynn wrote: Medication:HYDROcodone-acetaminophen (NORCO/VICODIN) 5-325 MG - pt takes 3/day - she has 1 day left Has the patient contacted their pharmacy? No - controlled Preferred Pharmacy (with phone number or street name): [id number] [place] [place] - [address] [phone] (Phone) [phone] (Fax)

## 2017-11-12 MED ORDER — HYDROCODONE-ACETAMINOPHEN 5-325 MG PO TABS: 1.0000 | ORAL_TABLET | Freq: Three times a day (TID) | ORAL | 0 refills | Status: DC | PRN

## 2017-11-12 NOTE — Telephone Encounter (Signed)
Patient is calling to check the status of this getting called in. I see that it says it was printed, told patient to follow up with pharmacy.

## 2017-11-12 NOTE — Telephone Encounter (Signed)
Pt picked up.

## 2017-12-14 ENCOUNTER — Other Ambulatory Visit: Payer: Self-pay | Admitting: Internal Medicine

## 2017-12-14 DIAGNOSIS — M8949 Other hypertrophic osteoarthropathy, multiple sites: Secondary | ICD-10-CM

## 2017-12-14 DIAGNOSIS — M150 Primary generalized (osteo)arthritis: Principal | ICD-10-CM

## 2017-12-14 DIAGNOSIS — M159 Polyosteoarthritis, unspecified: Secondary | ICD-10-CM

## 2017-12-14 NOTE — Telephone Encounter (Signed)
Copied from [place] [id number]. Topic: Quick Communication - See Telephone Encounter >> Dec 14, 2017  8:21 AM Conception Chancy, NT wrote: CRM for notification. See Telephone encounter for: 12/14/17.  Patient is needing a refill on the following medications, sent to the following pharmacies.   rosuvastatin (CRESTOR) 20 MG tablet.  [id number] - [place], [place] - [address] [place] [address] Phone: [phone] Fax: [phone]  HYDROcodone-acetaminophen (NORCO/VICODIN) 5-325 MG tablet  [id number] [place], [place] [address] [address] Phone: [phone] Fax: [phone]

## 2017-12-14 NOTE — Telephone Encounter (Signed)
Crestor refill Last Refill:06/11/16 # 30 11 refills Hydrocodone acetaminophen refill Last refill 11/12/17 #90 0 refills Last OV: 10/07/17 PCP: [facility] [facility] [address] [employer]  Crestor rx. expired Norco controlled substance

## 2017-12-15 MED ORDER — ROSUVASTATIN CALCIUM 20 MG PO TABS: 20.0000 mg | ORAL_TABLET | Freq: Every day | ORAL | 11 refills | Status: DC

## 2017-12-18 ENCOUNTER — Telehealth: Payer: Self-pay | Admitting: Internal Medicine

## 2017-12-18 MED ORDER — HYDROCODONE-ACETAMINOPHEN 5-325 MG PO TABS: 1.0000 | ORAL_TABLET | Freq: Three times a day (TID) | ORAL | 0 refills | Status: DC | PRN

## 2017-12-18 NOTE — Telephone Encounter (Unsigned)
Copied from [place] [id number]. Topic: Quick Communication - See Telephone Encounter >> Dec 14, 2017  8:21 AM Conception Chancy, NT wrote: CRM for notification. See Telephone encounter for: 12/14/17.  Patient is needing a refill on the following medications, sent to the following pharmacies.   rosuvastatin (CRESTOR) 20 MG tablet.  [id number] - [place], [place] - [address] [place] [address] Phone: [phone] Fax: [phone]  HYDROcodone-acetaminophen (NORCO/VICODIN) 5-325 MG tablet  [id number] [place] [place] - [place] [address] Phone: [phone] Fax: [phone]

## 2017-12-18 NOTE — Telephone Encounter (Signed)
Norco refill Last Refill:11/12/17 # 90 with 0 refills Last OV: 10/07/17 PCP: Dr. Alain Marion [facility]

## 2017-12-18 NOTE — Telephone Encounter (Signed)
RX request already sent to PCP

## 2018-01-06 ENCOUNTER — Ambulatory Visit: Payer: BC Managed Care – PPO | Admitting: Internal Medicine

## 2018-01-06 ENCOUNTER — Encounter: Payer: Self-pay | Admitting: Internal Medicine

## 2018-01-06 VITALS — BP 126/72 | HR 79 | Temp 97.6°F | Ht 65.5 in | Wt 278.0 lb

## 2018-01-06 DIAGNOSIS — M5441 Lumbago with sciatica, right side: Secondary | ICD-10-CM | POA: Diagnosis not present

## 2018-01-06 DIAGNOSIS — Z6841 Body Mass Index (BMI) 40.0 and over, adult: Secondary | ICD-10-CM

## 2018-01-06 DIAGNOSIS — Z23 Encounter for immunization: Secondary | ICD-10-CM | POA: Diagnosis not present

## 2018-01-06 DIAGNOSIS — M159 Polyosteoarthritis, unspecified: Secondary | ICD-10-CM

## 2018-01-06 DIAGNOSIS — M150 Primary generalized (osteo)arthritis: Secondary | ICD-10-CM | POA: Diagnosis not present

## 2018-01-06 DIAGNOSIS — E66813 Obesity, class 3: Secondary | ICD-10-CM

## 2018-01-06 DIAGNOSIS — E559 Vitamin D deficiency, unspecified: Secondary | ICD-10-CM

## 2018-01-06 DIAGNOSIS — M8949 Other hypertrophic osteoarthropathy, multiple sites: Secondary | ICD-10-CM

## 2018-01-06 MED ORDER — ZOLPIDEM TARTRATE 10 MG PO TABS: ORAL_TABLET | ORAL | 3 refills | Status: DC

## 2018-01-06 MED ORDER — HYDROCODONE-ACETAMINOPHEN 5-325 MG PO TABS: 1.0000 | ORAL_TABLET | Freq: Three times a day (TID) | ORAL | 0 refills | Status: DC | PRN

## 2018-01-06 MED ORDER — METHYLPREDNISOLONE ACETATE 80 MG/ML IJ SUSP
80.0000 mg | Freq: Once | INTRAMUSCULAR | Status: AC
  Administered 2018-01-06: 80 mg via INTRAMUSCULAR

## 2018-01-06 MED ORDER — NAPROXEN 500 MG PO TABS: 500.0000 mg | ORAL_TABLET | Freq: Two times a day (BID) | ORAL | 0 refills | Status: DC | PRN

## 2018-01-06 MED ORDER — CLONAZEPAM 1 MG PO TABS: 1.0000 mg | ORAL_TABLET | Freq: Two times a day (BID) | ORAL | 2 refills | Status: DC

## 2018-01-06 NOTE — Assessment & Plan Note (Signed)
Vit D 

## 2018-01-06 NOTE — Progress Notes (Signed)
Subjective:  Patient ID: Jessica Lynn, female    DOB: 31-May-1960  Age: 57 y.o. MRN: [id number]  CC: No chief complaint on file.   HPI LAWANDA HOLZHEIMER presents for LBP, anxiety, HTN C/o worsening R sciatica  Outpatient Medications Prior to Visit  Medication Sig Dispense Refill  . Adalimumab 40 MG/0.8ML PNKT Inject 40 mg into the skin.    . Cholecalciferol (VITAMIN D3) 2000 units capsule Take 1 capsule (2,000 Units total) by mouth daily. 100 capsule 3  . clonazePAM (KLONOPIN) 1 MG tablet Take 1 tablet (1 mg total) by mouth 2 (two) times daily. 60 tablet 2  . cycloSPORINE modified (NEORAL) 50 MG capsule Take 1 capsule by mouth 2 (two) times daily.    . dorzolamide-timolol (COSOPT) 22.3-6.8 MG/ML ophthalmic solution Place 1 drop into both eyes 2 (two) times daily. 10 mL 12  . fluorometholone (FML) 0.1 % ophthalmic suspension Place 1 drop into both eyes 4 (four) times daily. 5 mL 0  . fluticasone (FLONASE) 50 MCG/ACT nasal spray 2 sprays in each nostril two times daily 16 g 6  . folic acid (FOLVITE) 1 MG tablet Take 1 mg by mouth daily.    [name] HYDROcodone-acetaminophen (NORCO/VICODIN) 5-325 MG tablet Take 1 tablet by mouth 3 (three) times daily as needed for moderate pain. schedule office visit 90 tablet 0  . hydrOXYzine (ATARAX/VISTARIL) 25 MG tablet TAKE 1 TABLET (25 MG TOTAL) BY MOUTH EVERY 8 (EIGHT) HOURS AS NEEDED FOR ITCHING. 90 tablet 3  . mycophenolate (CELLCEPT) 500 MG tablet Take by mouth.    . rosuvastatin (CRESTOR) 20 MG tablet Take 1 tablet (20 mg total) by mouth daily. 30 tablet 11  . valACYclovir (VALTREX) 1000 MG tablet Take 1 tablet (1,000 mg total) by mouth 3 (three) times daily. 21 tablet 0  . valsartan-hydrochlorothiazide (DIOVAN HCT) 160-12.5 MG tablet Take 1 tablet by mouth daily. 30 tablet 11  . zolpidem (AMBIEN) 10 MG tablet TAKE 1 TABLET BY MOUTH AT BEDTIME AS NEEDED FOR SLEEP 30 tablet 3   No facility-administered medications prior to visit.     ROS: Review of  Systems  Constitutional: Positive for unexpected weight change. Negative for activity change, appetite change, chills and fatigue.  HENT: Negative for congestion, mouth sores and sinus pressure.   Eyes: Negative for visual disturbance.  Respiratory: Negative for cough and chest tightness.   Gastrointestinal: Negative for abdominal pain and nausea.  Genitourinary: Negative for difficulty urinating, frequency and vaginal pain.  Musculoskeletal: Positive for back pain. Negative for gait problem.  Skin: Negative for pallor and rash.  Neurological: Negative for dizziness, tremors, weakness, numbness and headaches.  Psychiatric/Behavioral: Negative for confusion and sleep disturbance.    Objective:  BP 126/72 (BP Location: Left Arm, Patient Position: Sitting, Cuff Size: Large)   Pulse 79   Temp 97.6 F (36.4 C) (Oral)   Ht 5' 5.5" (1.664 m)   Wt 278 lb (126.1 kg)   LMP 06/06/2015 Comment: irregular   SpO2 95%   BMI 45.56 kg/m   BP Readings from Last 3 Encounters:  01/06/18 126/72  10/07/17 128/90  10/05/17 (!) 141/78    Wt Readings from Last 3 Encounters:  01/06/18 278 lb (126.1 kg)  10/07/17 270 lb (122.5 kg)  10/05/17 270 lb 12.8 oz (122.8 kg)    Physical Exam  Constitutional: She appears well-developed. No distress.  HENT:  Head: Normocephalic.  Right Ear: External ear normal.  Left Ear: External ear normal.  Nose: Nose normal.  Mouth/Throat: Oropharynx is clear and moist.  Eyes: Pupils are equal, round, and reactive to light. Conjunctivae are normal. Right eye exhibits no discharge. Left eye exhibits no discharge.  Neck: Normal range of motion. Neck supple. No JVD present. No tracheal deviation present. No thyromegaly present.  Cardiovascular: Normal rate, regular rhythm and normal heart sounds.  Pulmonary/Chest: No stridor. No respiratory distress. She has no wheezes.  Abdominal: Soft. Bowel sounds are normal. She exhibits no distension and no mass. There is no  tenderness. There is no rebound and no guarding.  Musculoskeletal: She exhibits tenderness. She exhibits no edema.  Lymphadenopathy:    She has no cervical adenopathy.  Neurological: She displays normal reflexes. No cranial nerve deficit. She exhibits normal muscle tone. Coordination normal.  Skin: No rash noted. No erythema.  Psychiatric: She has a normal mood and affect. Her behavior is normal. Judgment and thought content normal.   obese  Lab Results  Component Value Date   WBC 8.2 01/14/2017   HGB 11.8 (L) 01/14/2017   HCT 36.3 01/14/2017   PLT 258.0 01/14/2017   GLUCOSE 98 01/14/2017   CHOL 155 01/14/2017   TRIG 74.0 01/14/2017   HDL 64.70 01/14/2017   LDLDIRECT 160.7 02/06/2012   LDLCALC 75 01/14/2017   ALT 19 01/14/2017   AST 20 01/14/2017   NA 141 01/14/2017   K 3.8 01/14/2017   CL 107 01/14/2017   CREATININE 0.71 01/14/2017   BUN 11 01/14/2017   CO2 27 01/14/2017   TSH 0.80 01/14/2017    Mm 3d Screen Breast Bilateral  Result Date: 08/05/2017 CLINICAL DATA:  Screening. EXAM: DIGITAL SCREENING BILATERAL MAMMOGRAM WITH TOMO AND CAD COMPARISON:  Previous exam(s). ACR Breast Density Category b: There are scattered areas of fibroglandular density. FINDINGS: There are no findings suspicious for malignancy. Images were processed with CAD. IMPRESSION: No mammographic evidence of malignancy. A result letter of this screening mammogram will be mailed directly to the patient. RECOMMENDATION: Screening mammogram in one year. (Code:SM-B-01Y) BI-RADS CATEGORY  1: Negative. Electronically Signed   By: Abelardo Diesel M.D.   On: 08/05/2017 09:19    Assessment & Plan:   There are no diagnoses linked to this encounter.   No orders of the defined types were placed in this encounter.    Follow-up: No follow-ups on file.  Walker Kehr, MD

## 2018-01-06 NOTE — Assessment & Plan Note (Addendum)
Worse - R sciatica and R knee pain Naproxen prn Norco prn  Potential benefits of a long term opioids use as well as potential risks (i.e. addiction risk, apnea etc) and complications (i.e. Somnolence, constipation and others) were explained to the patient and were aknowledged.  Dr Lynann Bologna f/u [facility] ref Depo-medrol 80 mg IM

## 2018-01-06 NOTE — Assessment & Plan Note (Addendum)
Worse Discussed Wt loss clinic ref

## 2018-02-01 ENCOUNTER — Ambulatory Visit: Payer: Self-pay

## 2018-02-01 NOTE — Telephone Encounter (Signed)
Called pt again. Pt  was wanting to know when was her last Tetanus shot. Pt informed it was 10/07/17.   Reason for Disposition . General information question, no triage required and triager able to answer question  Answer Assessment - Initial Assessment Questions 1. REASON FOR CALL or QUESTION: "What is your reason for calling today?" or "How can I best help you?" or "What question do you have that I can help answer?"     Pt wanted to know when her last tetanus shot was, Pt informed it was 10/07/17.  Protocols used: INFORMATION ONLY CALL-A-AH

## 2018-02-01 NOTE — Telephone Encounter (Signed)
Called and unable to leave message- mailbox is full. Pt wanting to know the difference between TB and Tetanus

## 2018-02-11 ENCOUNTER — Other Ambulatory Visit: Payer: Self-pay | Admitting: Internal Medicine

## 2018-02-12 ENCOUNTER — Other Ambulatory Visit: Payer: Self-pay | Admitting: Internal Medicine

## 2018-02-12 DIAGNOSIS — M159 Polyosteoarthritis, unspecified: Secondary | ICD-10-CM

## 2018-02-12 DIAGNOSIS — M150 Primary generalized (osteo)arthritis: Principal | ICD-10-CM

## 2018-02-12 DIAGNOSIS — M8949 Other hypertrophic osteoarthropathy, multiple sites: Secondary | ICD-10-CM

## 2018-02-12 NOTE — Telephone Encounter (Signed)
Copied from [place] [id number]. Topic: Quick Communication - Rx Refill/Question >> Feb 12, 2018 10:46 AM Burchel, Abbi R wrote: Medication: HYDROcodone-acetaminophen (NORCO/VICODIN) 5-325 MG tablet  Preferred Pharmacy: [id number] - [place], [place] - [address] [place] [address] Phone: [phone] Fax: [phone]    Pt was advised that RX refills may take up to 3 business days. We ask that you follow-up with your pharmacy.

## 2018-02-12 NOTE — Telephone Encounter (Signed)
Requested medication (s) are due for refill today: yes  Requested medication (s) are on the active medication list: yes  Last refill:  01/06/18 #30  Future visit scheduled: yes 04/08/18  Notes to clinic:  [place] on 01/06/18    Requested Prescriptions  Pending Prescriptions Disp Refills   HYDROcodone-acetaminophen (NORCO/VICODIN) 5-325 MG tablet 90 tablet 0    Sig: Take 1 tablet by mouth 3 (three) times daily as needed for moderate pain. schedule office visit     Not Delegated - Analgesics:  Opioid Agonist Combinations Failed - 02/12/2018 10:50 AM      Failed - This refill cannot be delegated      Failed - Urine Drug Screen completed in last 360 days.      Passed - Valid encounter within last 6 months    Recent Outpatient Visits          1 month ago Need for influenza vaccination   [place] Plotnikov, Evie Lacks, MD   4 months ago Need for Tdap vaccination   [place] Plotnikov, Evie Lacks, MD   7 months ago Retinal vasculitis of both eyes   [place], MD   10 months ago Retinal vasculitis of both eyes   [facility] Plotnikov, Evie Lacks, MD   1 year ago Essential hypertension   [place], Evie Lacks, MD      Future Appointments            In 1 month Plotnikov, Evie Lacks, MD [place], [place]

## 2018-02-16 MED ORDER — HYDROCODONE-ACETAMINOPHEN 5-325 MG PO TABS: 1.0000 | ORAL_TABLET | Freq: Three times a day (TID) | ORAL | 0 refills | Status: DC | PRN

## 2018-03-12 ENCOUNTER — Other Ambulatory Visit: Payer: Self-pay | Admitting: Internal Medicine

## 2018-03-17 ENCOUNTER — Other Ambulatory Visit: Payer: Self-pay | Admitting: Internal Medicine

## 2018-03-17 DIAGNOSIS — M8949 Other hypertrophic osteoarthropathy, multiple sites: Secondary | ICD-10-CM

## 2018-03-17 DIAGNOSIS — M150 Primary generalized (osteo)arthritis: Principal | ICD-10-CM

## 2018-03-17 DIAGNOSIS — M159 Polyosteoarthritis, unspecified: Secondary | ICD-10-CM

## 2018-03-17 NOTE — Telephone Encounter (Signed)
Copied from [place] [id number]. Topic: Quick Communication - Rx Refill/Question >> Mar 17, 2018  9:18 AM Oneta Rack wrote: Medication: HYDROcodone-acetaminophen (NORCO/VICODIN) 5-325 MG tablet   Has the patient contacted their pharmacy? Yes  (Agent: If yes, when and what did the pharmacy advise?) contact PCP   Preferred Pharmacy (with phone number or street name):  [id number] - [place], [place] - [address] [phone] (Phone) [phone] (Fax)    Agent: Please be advised that RX refills may take up to 3 business days. We ask that you follow-up with your pharmacy.

## 2018-03-23 ENCOUNTER — Other Ambulatory Visit: Payer: Self-pay | Admitting: Internal Medicine

## 2018-03-23 MED ORDER — HYDROCODONE-ACETAMINOPHEN 5-325 MG PO TABS: 1.0000 | ORAL_TABLET | Freq: Three times a day (TID) | ORAL | 0 refills | Status: DC | PRN

## 2018-03-23 NOTE — Telephone Encounter (Signed)
sch OV 

## 2018-04-08 ENCOUNTER — Ambulatory Visit: Payer: BC Managed Care – PPO | Admitting: Internal Medicine

## 2018-04-08 ENCOUNTER — Encounter: Payer: Self-pay | Admitting: Internal Medicine

## 2018-04-08 DIAGNOSIS — F411 Generalized anxiety disorder: Secondary | ICD-10-CM | POA: Diagnosis not present

## 2018-04-08 DIAGNOSIS — G4700 Insomnia, unspecified: Secondary | ICD-10-CM

## 2018-04-08 DIAGNOSIS — M159 Polyosteoarthritis, unspecified: Secondary | ICD-10-CM

## 2018-04-08 DIAGNOSIS — M5441 Lumbago with sciatica, right side: Secondary | ICD-10-CM | POA: Diagnosis not present

## 2018-04-08 DIAGNOSIS — I10 Essential (primary) hypertension: Secondary | ICD-10-CM | POA: Diagnosis not present

## 2018-04-08 DIAGNOSIS — M150 Primary generalized (osteo)arthritis: Secondary | ICD-10-CM

## 2018-04-08 DIAGNOSIS — J309 Allergic rhinitis, unspecified: Secondary | ICD-10-CM

## 2018-04-08 DIAGNOSIS — M8949 Other hypertrophic osteoarthropathy, multiple sites: Secondary | ICD-10-CM

## 2018-04-08 MED ORDER — HYDROCODONE-ACETAMINOPHEN 5-325 MG PO TABS: 1.0000 | ORAL_TABLET | Freq: Three times a day (TID) | ORAL | 0 refills | Status: DC | PRN

## 2018-04-08 MED ORDER — MONTELUKAST SODIUM 10 MG PO TABS: 10.0000 mg | ORAL_TABLET | Freq: Every day | ORAL | 3 refills | Status: DC

## 2018-04-08 NOTE — Progress Notes (Signed)
Subjective:  Patient ID: Jessica Lynn, female    DOB: March 02, 1961  Age: 58 y.o. MRN: [id number]  CC: No chief complaint on file.   HPI Jessica Lynn presents for LBP/OA, dyslipidemia, anxiety, HTN C/o LBP irrad to the R hip x 3 mo C/o allergies - worse...  Outpatient Medications Prior to Visit  Medication Sig Dispense Refill  . Adalimumab 40 MG/0.8ML PNKT Inject 40 mg into the skin.    . Cholecalciferol (VITAMIN D3) 2000 units capsule Take 1 capsule (2,000 Units total) by mouth daily. 100 capsule 3  . clonazePAM (KLONOPIN) 1 MG tablet Take 1 tablet (1 mg total) by mouth 2 (two) times daily. 60 tablet 2  . cycloSPORINE modified (NEORAL) 50 MG capsule Take 1 capsule by mouth 2 (two) times daily.    . dorzolamide-timolol (COSOPT) 22.3-6.8 MG/ML ophthalmic solution Place 1 drop into both eyes 2 (two) times daily. 10 mL 12  . fluorometholone (FML) 0.1 % ophthalmic suspension Place 1 drop into both eyes 4 (four) times daily. 5 mL 0  . fluticasone (FLONASE) 50 MCG/ACT nasal spray 2 sprays in each nostril two times daily 16 g 6  . folic acid (FOLVITE) 1 MG tablet Take 1 mg by mouth daily.    [name] HYDROcodone-acetaminophen (NORCO/VICODIN) 5-325 MG tablet Take 1 tablet by mouth 3 (three) times daily as needed for moderate pain. 90 tablet 0  . hydrOXYzine (ATARAX/VISTARIL) 25 MG tablet TAKE 1 TABLET (25 MG TOTAL) BY MOUTH EVERY 8 (EIGHT) HOURS AS NEEDED FOR ITCHING. 90 tablet 3  . mycophenolate (CELLCEPT) 500 MG tablet Take by mouth.    . naproxen (NAPROSYN) 500 MG tablet TAKE 1 TABLET BY MOUTH TWICE DAILY WITH FOOD FOR MODERATE PAIN 60 tablet 3  . rosuvastatin (CRESTOR) 20 MG tablet Take 1 tablet (20 mg total) by mouth daily. 30 tablet 11  . valACYclovir (VALTREX) 1000 MG tablet Take 1 tablet (1,000 mg total) by mouth 3 (three) times daily. 21 tablet 0  . valsartan-hydrochlorothiazide (DIOVAN HCT) 160-12.5 MG tablet Take 1 tablet by mouth daily. 30 tablet 11  . zolpidem (AMBIEN) 10 MG tablet TAKE  1 TABLET BY MOUTH EVERY NIGHT AT BEDTIME AS NEEDED FOR SLEEP 30 tablet 1   No facility-administered medications prior to visit.     ROS: Review of Systems  Constitutional: Negative.  Negative for activity change, appetite change, chills, diaphoresis, fatigue, fever and unexpected weight change.  HENT: Negative for congestion, ear pain, facial swelling, hearing loss, mouth sores, nosebleeds, postnasal drip, rhinorrhea, sinus pressure, sneezing, sore throat, tinnitus and trouble swallowing.   Eyes: Negative for pain, discharge, redness, itching and visual disturbance.  Respiratory: Negative for cough, chest tightness, shortness of breath, wheezing and stridor.   Cardiovascular: Negative for chest pain, palpitations and leg swelling.  Gastrointestinal: Negative for abdominal distention, anal bleeding, blood in stool, constipation, diarrhea, nausea and rectal pain.  Genitourinary: Negative for difficulty urinating, dysuria, flank pain, frequency, genital sores, hematuria, pelvic pain, urgency, vaginal bleeding and vaginal discharge.  Musculoskeletal: Positive for arthralgias, back pain and gait problem. Negative for joint swelling, neck pain and neck stiffness.  Skin: Negative.  Negative for rash.  Neurological: Negative for dizziness, tremors, seizures, syncope, speech difficulty, weakness, numbness and headaches.  Hematological: Negative for adenopathy. Does not bruise/bleed easily.  Psychiatric/Behavioral: Negative for behavioral problems, decreased concentration, dysphoric mood, sleep disturbance and suicidal ideas. The patient is nervous/anxious.     Objective:  BP 130/82 (BP Location: Left Arm, Patient Position: Sitting, Cuff  Size: Large)   Pulse 81   Temp (!) 97.5 F (36.4 C) (Oral)   Ht 5' 5.5" (1.664 m)   Wt 270 lb (122.5 kg)   LMP 06/06/2015 Comment: irregular   SpO2 95%   BMI 44.25 kg/m   BP Readings from Last 3 Encounters:  04/08/18 130/82  01/06/18 126/72  10/07/17 128/90     Wt Readings from Last 3 Encounters:  04/08/18 270 lb (122.5 kg)  01/06/18 278 lb (126.1 kg)  10/07/17 270 lb (122.5 kg)    Physical Exam Constitutional:      General: She is not in acute distress.    Appearance: Normal appearance. She is well-developed.  HENT:     Head: Normocephalic.     Right Ear: External ear normal.     Left Ear: External ear normal.     Nose: Nose normal.  Eyes:     General:        Right eye: No discharge.        Left eye: No discharge.     Conjunctiva/sclera: Conjunctivae normal.     Pupils: Pupils are equal, round, and reactive to light.  Neck:     Musculoskeletal: Normal range of motion and neck supple.     Thyroid: No thyromegaly.     Vascular: No JVD.     Trachea: No tracheal deviation.  Cardiovascular:     Rate and Rhythm: Normal rate and regular rhythm.     Heart sounds: Normal heart sounds.  Pulmonary:     Effort: No respiratory distress.     Breath sounds: No stridor. No wheezing.  Abdominal:     General: Bowel sounds are normal. There is no distension.     Palpations: Abdomen is soft. There is no mass.     Tenderness: There is no abdominal tenderness. There is no guarding or rebound.  Musculoskeletal:        General: Tenderness present.  Lymphadenopathy:     Cervical: No cervical adenopathy.  Skin:    Findings: No erythema or rash.  Neurological:     Cranial Nerves: No cranial nerve deficit.     Motor: No abnormal muscle tone.     Coordination: Coordination normal.     Deep Tendon Reflexes: Reflexes normal.  Psychiatric:        Behavior: Behavior normal.        Thought Content: Thought content normal.        Judgment: Judgment normal.   Obese LS tender  Lab Results  Component Value Date   WBC 8.2 01/14/2017   HGB 11.8 (L) 01/14/2017   HCT 36.3 01/14/2017   PLT 258.0 01/14/2017   GLUCOSE 98 01/14/2017   CHOL 155 01/14/2017   TRIG 74.0 01/14/2017   HDL 64.70 01/14/2017   LDLDIRECT 160.7 02/06/2012   LDLCALC 75  01/14/2017   ALT 19 01/14/2017   AST 20 01/14/2017   NA 141 01/14/2017   K 3.8 01/14/2017   CL 107 01/14/2017   CREATININE 0.71 01/14/2017   BUN 11 01/14/2017   CO2 27 01/14/2017   TSH 0.80 01/14/2017    Mm 3d Screen Breast Bilateral  Result Date: 08/05/2017 CLINICAL DATA:  Screening. EXAM: DIGITAL SCREENING BILATERAL MAMMOGRAM WITH TOMO AND CAD COMPARISON:  Previous exam(s). ACR Breast Density Category b: There are scattered areas of fibroglandular density. FINDINGS: There are no findings suspicious for malignancy. Images were processed with CAD. IMPRESSION: No mammographic evidence of malignancy. A result letter of this screening mammogram will  be mailed directly to the patient. RECOMMENDATION: Screening mammogram in one year. (Code:SM-B-01Y) BI-RADS CATEGORY  1: Negative. Electronically Signed   By: Abelardo Diesel M.D.   On: 08/05/2017 09:19    Assessment & Plan:   Diagnoses and all orders for this visit:  Primary osteoarthritis involving multiple joints     No orders of the defined types were placed in this encounter.    Follow-up: No follow-ups on file.  Walker Kehr, MD

## 2018-04-08 NOTE — Assessment & Plan Note (Signed)
Clonazepam prn Not to take w/Norco  Potential benefits of a long term benzodiazepines  use as well as potential risks  and complications were explained to the patient and were aknowledged.

## 2018-04-08 NOTE — Assessment & Plan Note (Signed)
Zolpidem Not to take w/Norco  Potential benefits of a long term benzodiazepines  use as well as potential risks  and complications were explained to the patient and were aknowledged.

## 2018-04-08 NOTE — Assessment & Plan Note (Signed)
Valsartan HCT 

## 2018-04-08 NOTE — Assessment & Plan Note (Signed)
Worse - added Singulair

## 2018-04-08 NOTE — Assessment & Plan Note (Signed)
F/u w/Dr Lynann Bologna Norco prn  Potential benefits of a long term opioids use as well as potential risks (i.e. addiction risk, apnea etc) and complications (i.e. Somnolence, constipation and others) were explained to the patient and were aknowledged.

## 2018-04-26 ENCOUNTER — Telehealth: Payer: Self-pay | Admitting: Internal Medicine

## 2018-04-26 NOTE — Telephone Encounter (Signed)
Copied from [place] [phone]. Topic: Quick Communication - See Telephone Encounter >> Apr 26, 2018  9:47 AM Ivar Drape wrote: CRM for notification. See Telephone encounter for: 04/26/18. Patient would like for the provider to fax paperwork to her insurance company okaying for the patient to receive zolpidem (AMBIEN) 10 MG tablet medication monthly.

## 2018-04-29 NOTE — Telephone Encounter (Signed)
Patient called to check on the status of her prior authorization for her Zolpidem.  Patient said she requested it on Monday and still has not heard anything.

## 2018-04-30 NOTE — Telephone Encounter (Signed)
Key: [id number]

## 2018-04-30 NOTE — Telephone Encounter (Signed)
Approvedtoday  Your PA request has been approved. Additional information will be provided in the approval communication

## 2018-04-30 NOTE — Telephone Encounter (Signed)
Pt.notified

## 2018-05-14 ENCOUNTER — Other Ambulatory Visit: Payer: Self-pay | Admitting: Internal Medicine

## 2018-05-14 DIAGNOSIS — M159 Polyosteoarthritis, unspecified: Secondary | ICD-10-CM

## 2018-05-14 DIAGNOSIS — M8949 Other hypertrophic osteoarthropathy, multiple sites: Secondary | ICD-10-CM

## 2018-05-14 DIAGNOSIS — M150 Primary generalized (osteo)arthritis: Principal | ICD-10-CM

## 2018-05-14 NOTE — Telephone Encounter (Signed)
Copied from [place] [id number]. Topic: Quick Communication - Rx Refill/Question >> May 14, 2018  3:08 PM Sheran Luz wrote: Medication: HYDROcodone-acetaminophen (NORCO/VICODIN) 5-325 MG tablet   Patient is requesting a refill of this medication.   Preferred Pharmacy (with phone number or street name):[id number] - [place],  - [address] AT [place] [phone] (Phone) [phone] (Fax)

## 2018-05-18 NOTE — Telephone Encounter (Signed)
Patient called back to check status of the refill requested for Hydrocodone.

## 2018-05-19 MED ORDER — HYDROCODONE-ACETAMINOPHEN 5-325 MG PO TABS: 1.0000 | ORAL_TABLET | Freq: Three times a day (TID) | ORAL | 0 refills | Status: DC | PRN

## 2018-06-07 ENCOUNTER — Other Ambulatory Visit: Payer: Self-pay | Admitting: Internal Medicine

## 2018-06-16 ENCOUNTER — Other Ambulatory Visit: Payer: Self-pay | Admitting: Internal Medicine

## 2018-06-16 DIAGNOSIS — M8949 Other hypertrophic osteoarthropathy, multiple sites: Secondary | ICD-10-CM

## 2018-06-16 DIAGNOSIS — M150 Primary generalized (osteo)arthritis: Principal | ICD-10-CM

## 2018-06-16 DIAGNOSIS — M159 Polyosteoarthritis, unspecified: Secondary | ICD-10-CM

## 2018-06-16 NOTE — Telephone Encounter (Signed)
Medication not delegated to NT to refill  

## 2018-06-16 NOTE — Telephone Encounter (Signed)
Copied from [place] [id number]. Topic: Quick Communication - Rx Refill/Question >> Jun 16, 2018  9:53 AM Reyne [place] L wrote: Medication: HYDROcodone-acetaminophen (NORCO/VICODIN) 5-325 MG tablet  Has the patient contacted their pharmacy? No - states she has to call office to get this refilled (Agent: If no, request that the patient contact the pharmacy for the refill.) (Agent: If yes, when and what did the pharmacy advise?)  Preferred Pharmacy (with phone number or street name): [facility] #[id number] - [place], [place] - [address] AT [place] [phone] (Phone) [phone] (Fax)  Agent: Please be advised that RX refills may take up to 3 business days. We ask that you follow-up with your pharmacy.

## 2018-06-16 NOTE — Telephone Encounter (Signed)
Control database checked last refill: 05/19/2018 LOV: 04/08/2018 NOV: 07/08/2018

## 2018-06-16 NOTE — Addendum Note (Signed)
Addended by: Raford Pitcher R on: 06/16/2018 11:52 AM   Modules accepted: Orders

## 2018-06-18 NOTE — Telephone Encounter (Signed)
Pt called in to check the status of her refill request for this medication. I did advise.

## 2018-06-20 MED ORDER — HYDROCODONE-ACETAMINOPHEN 5-325 MG PO TABS: 1.0000 | ORAL_TABLET | Freq: Three times a day (TID) | ORAL | 0 refills | Status: DC | PRN

## 2018-06-20 NOTE — Telephone Encounter (Signed)
Needs ov

## 2018-06-21 NOTE — Telephone Encounter (Signed)
Patient has appt on 4/2 already scheduled

## 2018-06-21 NOTE — Telephone Encounter (Signed)
Can you please schedule an appointment for patient. Thank you  

## 2018-07-07 ENCOUNTER — Other Ambulatory Visit: Payer: Self-pay

## 2018-07-07 DIAGNOSIS — M8949 Other hypertrophic osteoarthropathy, multiple sites: Secondary | ICD-10-CM

## 2018-07-07 DIAGNOSIS — M150 Primary generalized (osteo)arthritis: Principal | ICD-10-CM

## 2018-07-07 DIAGNOSIS — M159 Polyosteoarthritis, unspecified: Secondary | ICD-10-CM

## 2018-07-08 ENCOUNTER — Ambulatory Visit: Payer: BC Managed Care – PPO | Admitting: Internal Medicine

## 2018-07-08 NOTE — Telephone Encounter (Signed)
Pt was unable to do virtual and rescheduled for 09/07/18

## 2018-07-22 ENCOUNTER — Other Ambulatory Visit: Payer: Self-pay | Admitting: Internal Medicine

## 2018-07-22 DIAGNOSIS — M150 Primary generalized (osteo)arthritis: Principal | ICD-10-CM

## 2018-07-22 DIAGNOSIS — M8949 Other hypertrophic osteoarthropathy, multiple sites: Secondary | ICD-10-CM

## 2018-07-22 DIAGNOSIS — M159 Polyosteoarthritis, unspecified: Secondary | ICD-10-CM

## 2018-07-22 NOTE — Telephone Encounter (Signed)
Requested medication (s) are due for refill today: yes  Requested medication (s) are on the active medication list:yes  Last refill:  06/20/2018  Future visit scheduled: yes  Notes to clinic:  Not delegated   Requested Prescriptions  Pending Prescriptions Disp Refills   HYDROcodone-acetaminophen (NORCO/VICODIN) 5-325 MG tablet 90 tablet 0    Sig: Take 1 tablet by mouth 3 (three) times daily as needed for severe pain.     Not Delegated - Analgesics:  Opioid Agonist Combinations Failed - 07/22/2018  9:17 AM      Failed - This refill cannot be delegated      Failed - Urine Drug Screen completed in last 360 days.      Passed - Valid encounter within last 6 months    Recent Outpatient Visits          3 months ago Primary osteoarthritis involving multiple joints   [place], MD   6 months ago Need for influenza vaccination   [place] Plotnikov, Evie Lacks, MD   9 months ago Need for Tdap vaccination   [place] Plotnikov, Evie Lacks, MD   1 year ago Retinal vasculitis of both eyes   [place], Evie Lacks, MD   1 year ago Retinal vasculitis of both eyes   [place], Evie Lacks, MD      Future Appointments            In 1 month Plotnikov, Evie Lacks, MD [place], [place]

## 2018-07-23 ENCOUNTER — Other Ambulatory Visit: Payer: Self-pay | Admitting: Internal Medicine

## 2018-07-28 NOTE — Telephone Encounter (Signed)
Please advise on refill for the patient

## 2018-07-30 ENCOUNTER — Telehealth: Payer: Self-pay | Admitting: Internal Medicine

## 2018-07-30 NOTE — Telephone Encounter (Signed)
Pt is unable to do virtual, please advise about refill

## 2018-07-30 NOTE — Telephone Encounter (Unsigned)
Copied from [place] [id number]. Topic: Quick Communication - Rx Refill/Question >> Jul 30, 2018  2:59 PM Carolyn Stare wrote:  Pt is following up her req from the below  refill   Medication HYDROcodone-acetaminophen (NORCO/VICODIN) 5-325 MG tablise   Preferred Pharmacy [facility] [address]  Agent: Please be advised that RX refills may take up to 3 business days. We ask that you follow-up with your pharmacy.

## 2018-08-02 NOTE — Telephone Encounter (Signed)
When should I call her? Thx

## 2018-08-02 NOTE — Telephone Encounter (Signed)
Can you see if patient will do telephone visit?

## 2018-08-02 NOTE — Telephone Encounter (Signed)
Phone call visit has been made for tomorrow 4/28 @ 830am.

## 2018-08-03 ENCOUNTER — Encounter: Payer: Self-pay | Admitting: Internal Medicine

## 2018-08-03 ENCOUNTER — Ambulatory Visit (INDEPENDENT_AMBULATORY_CARE_PROVIDER_SITE_OTHER): Payer: BC Managed Care – PPO | Admitting: Internal Medicine

## 2018-08-03 DIAGNOSIS — M159 Polyosteoarthritis, unspecified: Secondary | ICD-10-CM

## 2018-08-03 DIAGNOSIS — M150 Primary generalized (osteo)arthritis: Secondary | ICD-10-CM

## 2018-08-03 DIAGNOSIS — M25561 Pain in right knee: Secondary | ICD-10-CM

## 2018-08-03 DIAGNOSIS — G8929 Other chronic pain: Secondary | ICD-10-CM

## 2018-08-03 DIAGNOSIS — E7800 Pure hypercholesterolemia, unspecified: Secondary | ICD-10-CM

## 2018-08-03 DIAGNOSIS — F419 Anxiety disorder, unspecified: Secondary | ICD-10-CM

## 2018-08-03 DIAGNOSIS — M5441 Lumbago with sciatica, right side: Secondary | ICD-10-CM

## 2018-08-03 DIAGNOSIS — I10 Essential (primary) hypertension: Secondary | ICD-10-CM

## 2018-08-03 DIAGNOSIS — M8949 Other hypertrophic osteoarthropathy, multiple sites: Secondary | ICD-10-CM

## 2018-08-03 MED ORDER — CLONAZEPAM 1 MG PO TABS: ORAL_TABLET | ORAL | 3 refills | Status: DC

## 2018-08-03 MED ORDER — HYDROCODONE-ACETAMINOPHEN 5-325 MG PO TABS: 1.0000 | ORAL_TABLET | Freq: Three times a day (TID) | ORAL | 0 refills | Status: DC | PRN

## 2018-08-03 NOTE — Progress Notes (Signed)
Virtual Visit via Telephone Note  I connected with Jessica Lynn on 08/03/18 at  8:30 AM EDT by telephone and verified that I am speaking with the correct person using two identifiers.   I discussed the limitations, risks, security and privacy concerns of performing an evaluation and management service by telephone and the availability of in person appointments. I also discussed with the patient that there may be a patient responsible charge related to this service. The patient expressed understanding and agreed to proceed.   History of Present Illness:   Jessica Lynn is complaining of chronic low back pain.  Her right knee has been hurting her to stand on.  Follow-up on hypertension, dyslipidemia.  She is been having a lot of anxiety with her new job assignments.  She is not her prescription for clonazepam that she received 2 or 3 years ago. Observations/Objective:  On the patient's voice sounds normal Assessment and Plan:  See plan Follow Up Instructions:    I discussed the assessment and treatment plan with the patient. The patient was provided an opportunity to ask questions and all were answered. The patient agreed with the plan and demonstrated an understanding of the instructions.   The patient was advised to call back or seek an in-person evaluation if the symptoms worsen or if the condition fails to improve as anticipated.  I provided >20 minutes of non-face-to-face time during this encounter.   Walker Kehr, MD  Patient ID: Jessica Lynn, female   DOB: 11-06-1960, 58 y.o.   MRN: [id number]

## 2018-08-03 NOTE — Telephone Encounter (Signed)
Noted, thank you

## 2018-08-03 NOTE — Assessment & Plan Note (Signed)
Norco prn  Potential benefits of a long term opioids use as well as potential risks (i.e. addiction risk, apnea etc) and complications (i.e. Somnolence, constipation and others) were explained to the patient and were aknowledged. 

## 2018-08-03 NOTE — Assessment & Plan Note (Signed)
Worse due to COVID Clonazepam prn Not to take w/Norco  Potential benefits of a long term benzodiazepines  use as well as potential risks  and complications were explained to the patient and were aknowledged.

## 2018-08-03 NOTE — Assessment & Plan Note (Signed)
Continue with valsartan HCT

## 2018-08-03 NOTE — Assessment & Plan Note (Signed)
Continue with Crestor 

## 2018-08-16 ENCOUNTER — Encounter: Payer: Self-pay | Admitting: Internal Medicine

## 2018-08-23 ENCOUNTER — Encounter: Payer: Self-pay | Admitting: Internal Medicine

## 2018-09-07 ENCOUNTER — Other Ambulatory Visit: Payer: Self-pay

## 2018-09-07 ENCOUNTER — Ambulatory Visit (INDEPENDENT_AMBULATORY_CARE_PROVIDER_SITE_OTHER): Payer: BC Managed Care – PPO | Admitting: Internal Medicine

## 2018-09-07 ENCOUNTER — Encounter: Payer: Self-pay | Admitting: Internal Medicine

## 2018-09-07 ENCOUNTER — Other Ambulatory Visit (INDEPENDENT_AMBULATORY_CARE_PROVIDER_SITE_OTHER): Payer: BC Managed Care – PPO

## 2018-09-07 VITALS — BP 126/84 | HR 91 | Temp 98.1°F | Ht 65.5 in | Wt 255.0 lb

## 2018-09-07 DIAGNOSIS — M150 Primary generalized (osteo)arthritis: Secondary | ICD-10-CM

## 2018-09-07 DIAGNOSIS — Z0000 Encounter for general adult medical examination without abnormal findings: Secondary | ICD-10-CM

## 2018-09-07 DIAGNOSIS — M5441 Lumbago with sciatica, right side: Secondary | ICD-10-CM

## 2018-09-07 DIAGNOSIS — M159 Polyosteoarthritis, unspecified: Secondary | ICD-10-CM

## 2018-09-07 DIAGNOSIS — M25511 Pain in right shoulder: Secondary | ICD-10-CM

## 2018-09-07 DIAGNOSIS — E559 Vitamin D deficiency, unspecified: Secondary | ICD-10-CM | POA: Diagnosis not present

## 2018-09-07 DIAGNOSIS — I10 Essential (primary) hypertension: Secondary | ICD-10-CM

## 2018-09-07 DIAGNOSIS — G8929 Other chronic pain: Secondary | ICD-10-CM

## 2018-09-07 DIAGNOSIS — F419 Anxiety disorder, unspecified: Secondary | ICD-10-CM | POA: Diagnosis not present

## 2018-09-07 DIAGNOSIS — M8949 Other hypertrophic osteoarthropathy, multiple sites: Secondary | ICD-10-CM

## 2018-09-07 LAB — CBC WITH DIFFERENTIAL/PLATELET
Basophils Absolute: 0 10*3/uL (ref 0.0–0.1)
Basophils Relative: 0.5 % (ref 0.0–3.0)
Eosinophils Absolute: 0.1 10*3/uL (ref 0.0–0.7)
Eosinophils Relative: 1.4 % (ref 0.0–5.0)
HCT: 38.6 % (ref 36.0–46.0)
Hemoglobin: 13.1 g/dL (ref 12.0–15.0)
Lymphocytes Relative: 41.5 % (ref 12.0–46.0)
Lymphs Abs: 3.6 10*3/uL (ref 0.7–4.0)
MCHC: 34 g/dL (ref 30.0–36.0)
MCV: 92 fl (ref 78.0–100.0)
Monocytes Absolute: 0.7 10*3/uL (ref 0.1–1.0)
Monocytes Relative: 7.9 % (ref 3.0–12.0)
Neutro Abs: 4.2 10*3/uL (ref 1.4–7.7)
Neutrophils Relative %: 48.7 % (ref 43.0–77.0)
Platelets: 322 10*3/uL (ref 150.0–400.0)
RBC: 4.19 Mil/uL (ref 3.87–5.11)
RDW: 13.6 % (ref 11.5–15.5)
WBC: 8.6 10*3/uL (ref 4.0–10.5)

## 2018-09-07 LAB — URINALYSIS
Bilirubin Urine: NEGATIVE
Hgb urine dipstick: NEGATIVE
Ketones, ur: NEGATIVE
Leukocytes,Ua: NEGATIVE
Nitrite: NEGATIVE
Specific Gravity, Urine: 1.025 (ref 1.000–1.030)
Total Protein, Urine: NEGATIVE
Urine Glucose: NEGATIVE
Urobilinogen, UA: 1 (ref 0.0–1.0)
pH: 6 (ref 5.0–8.0)

## 2018-09-07 MED ORDER — NAPROXEN 500 MG PO TABS: ORAL_TABLET | ORAL | 3 refills | Status: DC

## 2018-09-07 MED ORDER — HYDROCODONE-ACETAMINOPHEN 5-325 MG PO TABS: 1.0000 | ORAL_TABLET | Freq: Three times a day (TID) | ORAL | 0 refills | Status: DC | PRN

## 2018-09-07 NOTE — Assessment & Plan Note (Signed)
Valsartan HCT Risks associated with treatment noncompliance were discussed. Compliance was encouraged.

## 2018-09-07 NOTE — Assessment & Plan Note (Signed)
Injection was offered

## 2018-09-07 NOTE — Assessment & Plan Note (Signed)
Vit D 

## 2018-09-07 NOTE — Assessment & Plan Note (Signed)
We discussed age appropriate health related issues, including available/recomended screening tests and vaccinations. We discussed a need for adhering to healthy diet and exercise. Labs were ordered to be later reviewed . All questions were answered. Colon, PAP, mammo pending

## 2018-09-07 NOTE — Assessment & Plan Note (Signed)
Norco prn  Potential benefits of a long term opioids use as well as potential risks (i.e. addiction risk, apnea etc) and complications (i.e. Somnolence, constipation and others) were explained to the patient and were aknowledged. 

## 2018-09-07 NOTE — Progress Notes (Signed)
Subjective:  Patient ID: Jessica Lynn, female    DOB: 1960-09-11  Age: 58 y.o. MRN: [id number]  CC: No chief complaint on file.   HPI MAGGIE SENSENEY presents for pain in the R shoulder and R arm. Working on the computer 5 h a day C/o chest discomfort - long time, better w/exertion  Outpatient Medications Prior to Visit  Medication Sig Dispense Refill  . Adalimumab 40 MG/0.8ML PNKT Inject 40 mg into the skin.    . Cholecalciferol (VITAMIN D3) 2000 units capsule Take 1 capsule (2,000 Units total) by mouth daily. 100 capsule 3  . clonazePAM (KLONOPIN) 1 MG tablet Take 1 tablet (1 mg total) by mouth 2 (two) times daily. 60 tablet 2  . clonazePAM (KLONOPIN) 1 MG tablet Take 1/2 to 1  Tablet by mouth two times daily as needed for nerves 60 tablet 3  . cycloSPORINE modified (NEORAL) 50 MG capsule Take 1 capsule by mouth 2 (two) times daily.    . dorzolamide-timolol (COSOPT) 22.3-6.8 MG/ML ophthalmic solution Place 1 drop into both eyes 2 (two) times daily. 10 mL 12  . fluorometholone (FML) 0.1 % ophthalmic suspension Place 1 drop into both eyes 4 (four) times daily. 5 mL 0  . fluticasone (FLONASE) 50 MCG/ACT nasal spray 2 sprays in each nostril two times daily 16 g 6  . folic acid (FOLVITE) 1 MG tablet Take 1 mg by mouth daily.    [name] HYDROcodone-acetaminophen (NORCO/VICODIN) 5-325 MG tablet Take 1 tablet by mouth 3 (three) times daily as needed for severe pain. 90 tablet 0  . hydrOXYzine (ATARAX/VISTARIL) 25 MG tablet TAKE 1 TABLET (25 MG TOTAL) BY MOUTH EVERY 8 (EIGHT) HOURS AS NEEDED FOR ITCHING. 90 tablet 3  . montelukast (SINGULAIR) 10 MG tablet Take 1 tablet (10 mg total) by mouth daily. 90 tablet 3  . mycophenolate (CELLCEPT) 500 MG tablet Take by mouth.    . naproxen (NAPROSYN) 500 MG tablet TAKE 1 TABLET BY MOUTH TWICE DAILY WITH FOOD FOR MODERATE PAIN 60 tablet 3  . rosuvastatin (CRESTOR) 20 MG tablet Take 1 tablet (20 mg total) by mouth daily. 30 tablet 11  . valACYclovir (VALTREX)  1000 MG tablet Take 1 tablet (1,000 mg total) by mouth 3 (three) times daily. 21 tablet 0  . valsartan-hydrochlorothiazide (DIOVAN HCT) 160-12.5 MG tablet Take 1 tablet by mouth daily. 30 tablet 11  . zolpidem (AMBIEN) 10 MG tablet TAKE 1 TABLET BY MOUTH EVERY NIGHT AT BEDTIME AS NEEDED FOR SLEEP 30 tablet 1   No facility-administered medications prior to visit.     ROS: Review of Systems  Constitutional: Positive for fatigue. Negative for activity change, appetite change, chills and unexpected weight change.  HENT: Negative for congestion, mouth sores and sinus pressure.   Eyes: Negative for visual disturbance.  Respiratory: Negative for cough and chest tightness.   Gastrointestinal: Negative for abdominal pain and nausea.  Genitourinary: Negative for difficulty urinating, frequency and vaginal pain.  Musculoskeletal: Positive for arthralgias, back pain, neck pain and neck stiffness. Negative for gait problem.  Skin: Negative for pallor and rash.  Neurological: Negative for dizziness, tremors, weakness, numbness and headaches.  Psychiatric/Behavioral: Negative for confusion and sleep disturbance.    Objective:  BP 126/84 (BP Location: Left Arm, Patient Position: Sitting, Cuff Size: Large)   Pulse 91   Temp 98.1 F (36.7 C) (Oral)   Ht 5' 5.5" (1.664 m)   Wt 255 lb (115.7 kg)   LMP 06/06/2015 Comment: irregular  SpO2 96%   BMI 41.79 kg/m   BP Readings from Last 3 Encounters:  09/07/18 126/84  04/08/18 130/82  01/06/18 126/72    Wt Readings from Last 3 Encounters:  09/07/18 255 lb (115.7 kg)  04/08/18 270 lb (122.5 kg)  01/06/18 278 lb (126.1 kg)    Physical Exam Constitutional:      General: She is not in acute distress.    Appearance: She is well-developed.  HENT:     Head: Normocephalic.     Right Ear: External ear normal.     Left Ear: External ear normal.     Nose: Nose normal.  Eyes:     General:        Right eye: No discharge.        Left eye: No  discharge.     Conjunctiva/sclera: Conjunctivae normal.     Pupils: Pupils are equal, round, and reactive to light.  Neck:     Musculoskeletal: Normal range of motion and neck supple.     Thyroid: No thyromegaly.     Vascular: No JVD.     Trachea: No tracheal deviation.  Cardiovascular:     Rate and Rhythm: Normal rate and regular rhythm.     Heart sounds: Normal heart sounds.  Pulmonary:     Effort: No respiratory distress.     Breath sounds: No stridor. No wheezing.  Abdominal:     General: Bowel sounds are normal. There is no distension.     Palpations: Abdomen is soft. There is mass.     Tenderness: There is no abdominal tenderness. There is no guarding or rebound.  Musculoskeletal:        General: Tenderness present.  Lymphadenopathy:     Cervical: No cervical adenopathy.  Skin:    Findings: No erythema or rash.  Neurological:     Cranial Nerves: No cranial nerve deficit.     Motor: No abnormal muscle tone.     Coordination: Coordination normal.     Deep Tendon Reflexes: Reflexes normal.  Psychiatric:        Behavior: Behavior normal.        Thought Content: Thought content normal.        Judgment: Judgment normal.   sternum - sensitive to palpation  Lab Results  Component Value Date   WBC 8.2 01/14/2017   HGB 11.8 (L) 01/14/2017   HCT 36.3 01/14/2017   PLT 258.0 01/14/2017   GLUCOSE 98 01/14/2017   CHOL 155 01/14/2017   TRIG 74.0 01/14/2017   HDL 64.70 01/14/2017   LDLDIRECT 160.7 02/06/2012   LDLCALC 75 01/14/2017   ALT 19 01/14/2017   AST 20 01/14/2017   NA 141 01/14/2017   K 3.8 01/14/2017   CL 107 01/14/2017   CREATININE 0.71 01/14/2017   BUN 11 01/14/2017   CO2 27 01/14/2017   TSH 0.80 01/14/2017    Mm 3d Screen Breast Bilateral  Result Date: 08/05/2017 CLINICAL DATA:  Screening. EXAM: DIGITAL SCREENING BILATERAL MAMMOGRAM WITH TOMO AND CAD COMPARISON:  Previous exam(s). ACR Breast Density Category b: There are scattered areas of fibroglandular  density. FINDINGS: There are no findings suspicious for malignancy. Images were processed with CAD. IMPRESSION: No mammographic evidence of malignancy. A result letter of this screening mammogram will be mailed directly to the patient. RECOMMENDATION: Screening mammogram in one year. (Code:SM-B-01Y) BI-RADS CATEGORY  1: Negative. Electronically Signed   By: Abelardo Diesel M.D.   On: 08/05/2017 09:19    Assessment & Plan:  There are no diagnoses linked to this encounter.   No orders of the defined types were placed in this encounter.    Follow-up: No follow-ups on file.  Walker Kehr, MD

## 2018-09-07 NOTE — Assessment & Plan Note (Signed)
Clonazepam prn Not to take w/Norco  Potential benefits of a long term benzodiazepines  use as well as potential risks  and complications were explained to the patient and were aknowledged.  

## 2018-09-08 LAB — HEPATIC FUNCTION PANEL
ALT: 15 U/L (ref 0–35)
AST: 18 U/L (ref 0–37)
Albumin: 4.3 g/dL (ref 3.5–5.2)
Alkaline Phosphatase: 71 U/L (ref 39–117)
Bilirubin, Direct: 0.2 mg/dL (ref 0.0–0.3)
Total Bilirubin: 1.2 mg/dL (ref 0.2–1.2)
Total Protein: 7.8 g/dL (ref 6.0–8.3)

## 2018-09-08 LAB — LIPID PANEL
Cholesterol: 271 mg/dL — ABNORMAL HIGH (ref 0–200)
HDL: 72.2 mg/dL (ref >39.00)
LDL Cholesterol: 183 mg/dL — ABNORMAL HIGH (ref 0–99)
NonHDL: 198.43
Total CHOL/HDL Ratio: 4
Triglycerides: 78 mg/dL (ref 0.0–149.0)
VLDL: 15.6 mg/dL (ref 0.0–40.0)

## 2018-09-08 LAB — BASIC METABOLIC PANEL
BUN: 19 mg/dL (ref 6–23)
CO2: 27 mEq/L (ref 19–32)
Calcium: 9.8 mg/dL (ref 8.4–10.5)
Chloride: 103 mEq/L (ref 96–112)
Creatinine, Ser: 0.75 mg/dL (ref 0.40–1.20)
GFR: 96.52 mL/min (ref >60.00)
Glucose, Bld: 95 mg/dL (ref 70–99)
Potassium: 3.7 mEq/L (ref 3.5–5.1)
Sodium: 138 mEq/L (ref 135–145)

## 2018-09-08 LAB — TSH: TSH: 0.74 u[IU]/mL (ref 0.35–4.50)

## 2018-09-08 LAB — VITAMIN D 25 HYDROXY (VIT D DEFICIENCY, FRACTURES): VITD: 35.6 ng/mL (ref 30.00–100.00)

## 2018-09-15 ENCOUNTER — Other Ambulatory Visit: Payer: Self-pay

## 2018-09-15 ENCOUNTER — Ambulatory Visit: Payer: BC Managed Care – PPO | Admitting: *Deleted

## 2018-09-15 VITALS — Ht 65.5 in | Wt 250.0 lb

## 2018-09-15 DIAGNOSIS — Z86010 Personal history of colonic polyps: Secondary | ICD-10-CM

## 2018-09-15 MED ORDER — NA SULFATE-K SULFATE-MG SULF 17.5-3.13-1.6 GM/177ML PO SOLN: ORAL | 0 refills | Status: DC

## 2018-09-15 NOTE — Progress Notes (Signed)
Patient's pre-visit was done today over the phone with the patient due to Covid-19. Name,DOB and address verified. Insurance verified. Packet of Prep instructions mailed to patient including copy of a consent form and pre-procedure patient acknowledgement form AND SUPREP COUPON-pt is aware. Patient understands to call us back with any questions or concerns.   Patient denies any allergies to eggs or soy. Patient denies any problems with anesthesia/sedation. Patient denies any oxygen use at home. Patient denies taking any diet/weight loss medications or blood thinners. EMMI education assisgned to patient on colonoscopy, this was explained and instructions given to patient.

## 2018-09-17 ENCOUNTER — Encounter: Payer: Self-pay | Admitting: Internal Medicine

## 2018-09-27 ENCOUNTER — Other Ambulatory Visit: Payer: Self-pay | Admitting: Obstetrics & Gynecology

## 2018-09-27 DIAGNOSIS — Z1231 Encounter for screening mammogram for malignant neoplasm of breast: Secondary | ICD-10-CM

## 2018-09-28 ENCOUNTER — Telehealth: Payer: Self-pay | Admitting: Internal Medicine

## 2018-09-28 NOTE — Telephone Encounter (Signed)
Spoke w/patient regarding the Covid-19 Screening Questions:   Do you now or have you had a fever in the last 14 days? NO Do you have any respiratory symptoms of shortness of breath or cough now or in the last 14 days? NO Do you have any family members or close contacts with diagnosed or suspected Covid-19 in the past 14 days? NO Have you been tested for Covid-19 and found to be positive? NO

## 2018-09-29 ENCOUNTER — Other Ambulatory Visit: Payer: Self-pay

## 2018-09-29 ENCOUNTER — Ambulatory Visit (AMBULATORY_SURGERY_CENTER): Payer: BC Managed Care – PPO | Admitting: Internal Medicine

## 2018-09-29 ENCOUNTER — Encounter: Payer: Self-pay | Admitting: Internal Medicine

## 2018-09-29 VITALS — BP 142/82 | HR 66 | Temp 97.6°F | Resp 15 | Ht 65.5 in | Wt 250.0 lb

## 2018-09-29 DIAGNOSIS — Z86010 Personal history of colonic polyps: Secondary | ICD-10-CM

## 2018-09-29 DIAGNOSIS — Z8371 Family history of colonic polyps: Secondary | ICD-10-CM

## 2018-09-29 MED ORDER — SODIUM CHLORIDE 0.9 % IV SOLN: 500.0000 mL | INTRAVENOUS | Status: DC

## 2018-09-29 NOTE — Progress Notes (Signed)
Report to PACU, RN, vss, BBS= Clear.  

## 2018-09-29 NOTE — Patient Instructions (Signed)
Information on diverticulosis given to you today.  Repeat colonoscopy in 5 years.  YOU HAD AN ENDOSCOPIC PROCEDURE TODAY AT [facility]:   Refer to the procedure report that was given to you for any specific questions about what was found during the examination.  If the procedure report does not answer your questions, please call your gastroenterologist to clarify.  If you requested that your care partner not be given the details of your procedure findings, then the procedure report has been included in a sealed envelope for you to review at your convenience later.  YOU SHOULD EXPECT: Some feelings of bloating in the abdomen. Passage of more gas than usual.  Walking can help get rid of the air that was put into your GI tract during the procedure and reduce the bloating. If you had a lower endoscopy (such as a colonoscopy or flexible sigmoidoscopy) you may notice spotting of blood in your stool or on the toilet paper. If you underwent a bowel prep for your procedure, you may not have a normal bowel movement for a few days.  Please Note:  You might notice some irritation and congestion in your nose or some drainage.  This is from the oxygen used during your procedure.  There is no need for concern and it should clear up in a day or so.  SYMPTOMS TO REPORT IMMEDIATELY:   Following lower endoscopy (colonoscopy or flexible sigmoidoscopy):  Excessive amounts of blood in the stool  Significant tenderness or worsening of abdominal pains  Swelling of the abdomen that is new, acute  Fever of 100F or higher  For urgent or emergent issues, a gastroenterologist can be reached at any hour by calling [phone].   DIET:  We do recommend a small meal at first, but then you may proceed to your regular diet.  Drink plenty of fluids but you should avoid alcoholic beverages for 24 hours.  ACTIVITY:  You should plan to take it easy for the rest of today and you should NOT DRIVE or use heavy  machinery until tomorrow (because of the sedation medicines used during the test).    FOLLOW UP: Our staff will call the number listed on your records 48-72 hours following your procedure to check on you and address any questions or concerns that you may have regarding the information given to you following your procedure. If we do not reach you, we will leave a message.  We will attempt to reach you two times.  During this call, we will ask if you have developed any symptoms of COVID 19. If you develop any symptoms (ie: fever, flu-like symptoms, shortness of breath, cough etc.) before then, please call [phone].  If you test positive for Covid 19 in the 2 weeks post procedure, please call and report this information to [place].    If any biopsies were taken you will be contacted by phone or by letter within the next 1-3 weeks.  Please call us at [phone] if you have not heard about the biopsies in 3 weeks.    SIGNATURES/CONFIDENTIALITY: You and/or your care partner have signed paperwork which will be entered into your electronic medical record.  These signatures attest to the fact that that the information above on your After Visit Summary has been reviewed and is understood.  Full responsibility of the confidentiality of this discharge information lies with you and/or your care-partner.

## 2018-09-29 NOTE — Op Note (Signed)
[place] Patient Name: Jessica Lynn Procedure Date: 09/29/2018 7:32 AM MRN: [id number] Endoscopist: Docia Chuck. Henrene Pastor , MD Age: 58 Referring MD:  Date of Birth: Feb 19, 1961 Gender: Female Account #: [id number] Procedure:                Colonoscopy Indications:              High risk colon cancer surveillance: Personal                            history of adenoma (10 mm or greater in size), High                            risk colon cancer surveillance: Personal history of                            multiple (3 or more) adenomas. Brother with CRC <                            61 yrs. Prior exam 07-2015 Medicines:                Monitored Anesthesia Care Procedure:                Pre-Anesthesia Assessment:                           - Prior to the procedure, a History and Physical                            was performed, and patient medications and                            allergies were reviewed. The patient's tolerance of                            previous anesthesia was also reviewed. The risks                            and benefits of the procedure and the sedation                            options and risks were discussed with the patient.                            All questions were answered, and informed consent                            was obtained. Prior Anticoagulants: The patient has                            taken no previous anticoagulant or antiplatelet                            agents. ASA Grade Assessment: II - A patient with  mild systemic disease. After reviewing the risks                            and benefits, the patient was deemed in                            satisfactory condition to undergo the procedure.                           After obtaining informed consent, the colonoscope                            was passed under direct vision. Throughout the                            procedure, the patient's blood pressure,  pulse, and                            oxygen saturations were monitored continuously. The                            Colonoscope was introduced through the anus and                            advanced to the the cecum, identified by                            appendiceal orifice and ileocecal valve. The                            ileocecal valve, appendiceal orifice, and rectum                            were photographed. The quality of the bowel                            preparation was excellent. The colonoscopy was                            performed without difficulty. The patient tolerated                            the procedure well. The bowel preparation used was                            SUPREP via split dose instruction. Scope In: 7:49:45 AM Scope Out: 8:06:29 AM Scope Withdrawal Time: 0 hours 11 minutes 18 seconds  Total Procedure Duration: 0 hours 16 minutes 44 seconds  Findings:                 Diverticula were found in the sigmoid colon.                           The exam was otherwise without abnormality on  direct and retroflexion views. Complications:            No immediate complications. Estimated blood loss:                            None. Estimated Blood Loss:     Estimated blood loss: none. Impression:               - Diverticulosis in the sigmoid colon.                           - The examination was otherwise normal on direct                            and retroflexion views.                           - No specimens collected. Recommendation:           - Repeat colonoscopy in 5 years for surveillance                            (personal and family hx).                           - Patient has a contact number available for                            emergencies. The signs and symptoms of potential                            delayed complications were discussed with the                            patient. Return to normal activities  tomorrow.                            Written discharge instructions were provided to the                            patient.                           - Resume previous diet.                           - Continue present medications. Docia Chuck. Henrene Pastor, MD 09/29/2018 8:17:13 AM This report has been signed electronically.

## 2018-10-01 ENCOUNTER — Telehealth: Payer: Self-pay

## 2018-10-01 NOTE — Telephone Encounter (Signed)
  Follow up Call-  Call back number 09/29/2018  Post procedure Call Back phone  # [phone]  Permission to leave phone message Yes  Some recent data might be hidden     Patient questions:  Do you have a fever, pain , or abdominal swelling? No. Pain Score  0 *  Have you tolerated food without any problems? Yes.    Have you been able to return to your normal activities? Yes.    Do you have any questions about your discharge instructions: Diet   No. Medications  No. Follow up visit  No.  Do you have questions or concerns about your Care? No.  Actions: * If pain score is 4 or above: No action needed, pain <4.  1. Have you developed a fever since your procedure? no  2.   Have you had an respiratory symptoms (SOB or cough) since your procedure? no  3.   Have you tested positive for COVID 19 since your procedure no  4.   Have you had any family members/close contacts diagnosed with the COVID 19 since your procedure?  no   If yes to any of these questions please route to Joylene John, RN and Alphonsa Gin, Therapist, sports.

## 2018-10-08 ENCOUNTER — Other Ambulatory Visit: Payer: Self-pay | Admitting: Internal Medicine

## 2018-10-08 DIAGNOSIS — M8949 Other hypertrophic osteoarthropathy, multiple sites: Secondary | ICD-10-CM

## 2018-10-08 DIAGNOSIS — M159 Polyosteoarthritis, unspecified: Secondary | ICD-10-CM

## 2018-10-08 NOTE — Telephone Encounter (Signed)
REFILL HYDROcodone-acetaminophen (NORCO/VICODIN) 5-325 MG tablet  PHARMACY [id number] [place], [place] [phone] (Phone) [phone] (Fax)

## 2018-10-12 IMAGING — DX DG CHEST 2V
2 series · 2 of 2 positions shown · non-contrast
Comparison: 11/28/2009 and 09/19/2008 chest x-ray.

CLINICAL DATA: 55-year-old female with cough and congestion with
intermittent wheezing for the past month. Hypertension. Former
smoker. Initial encounter.

EXAM:
CHEST  2 VIEW

[chest pa]
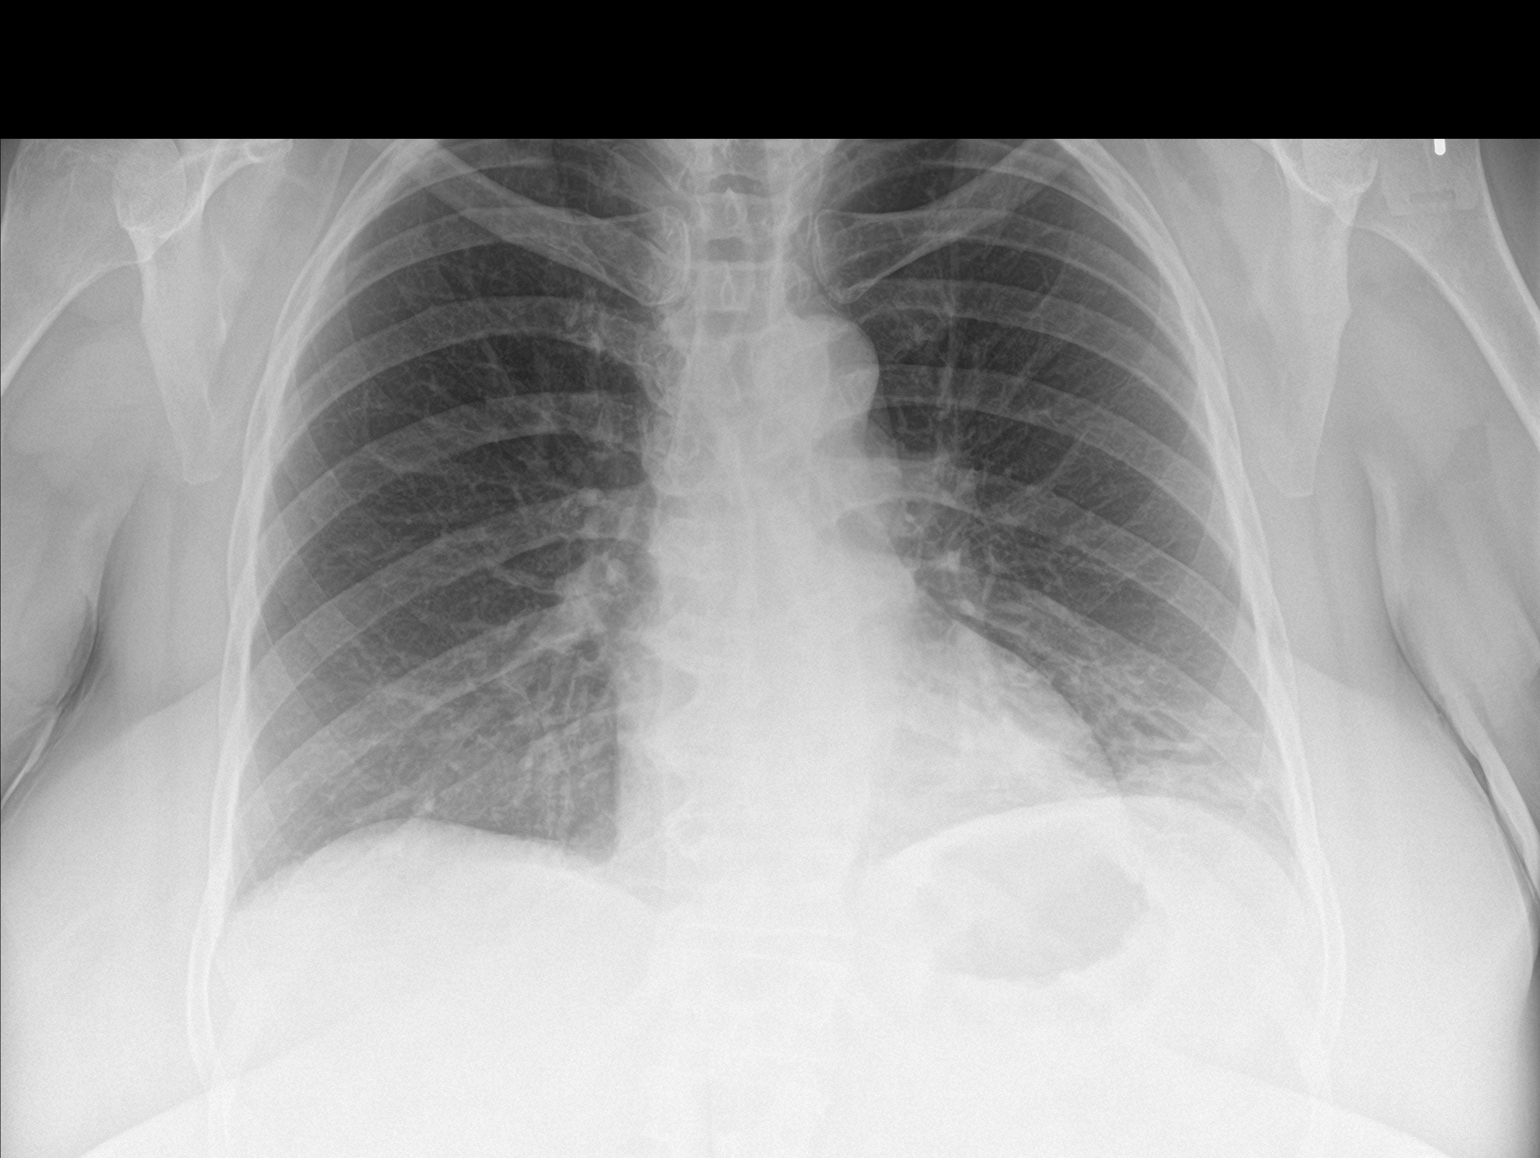

[chest lat]
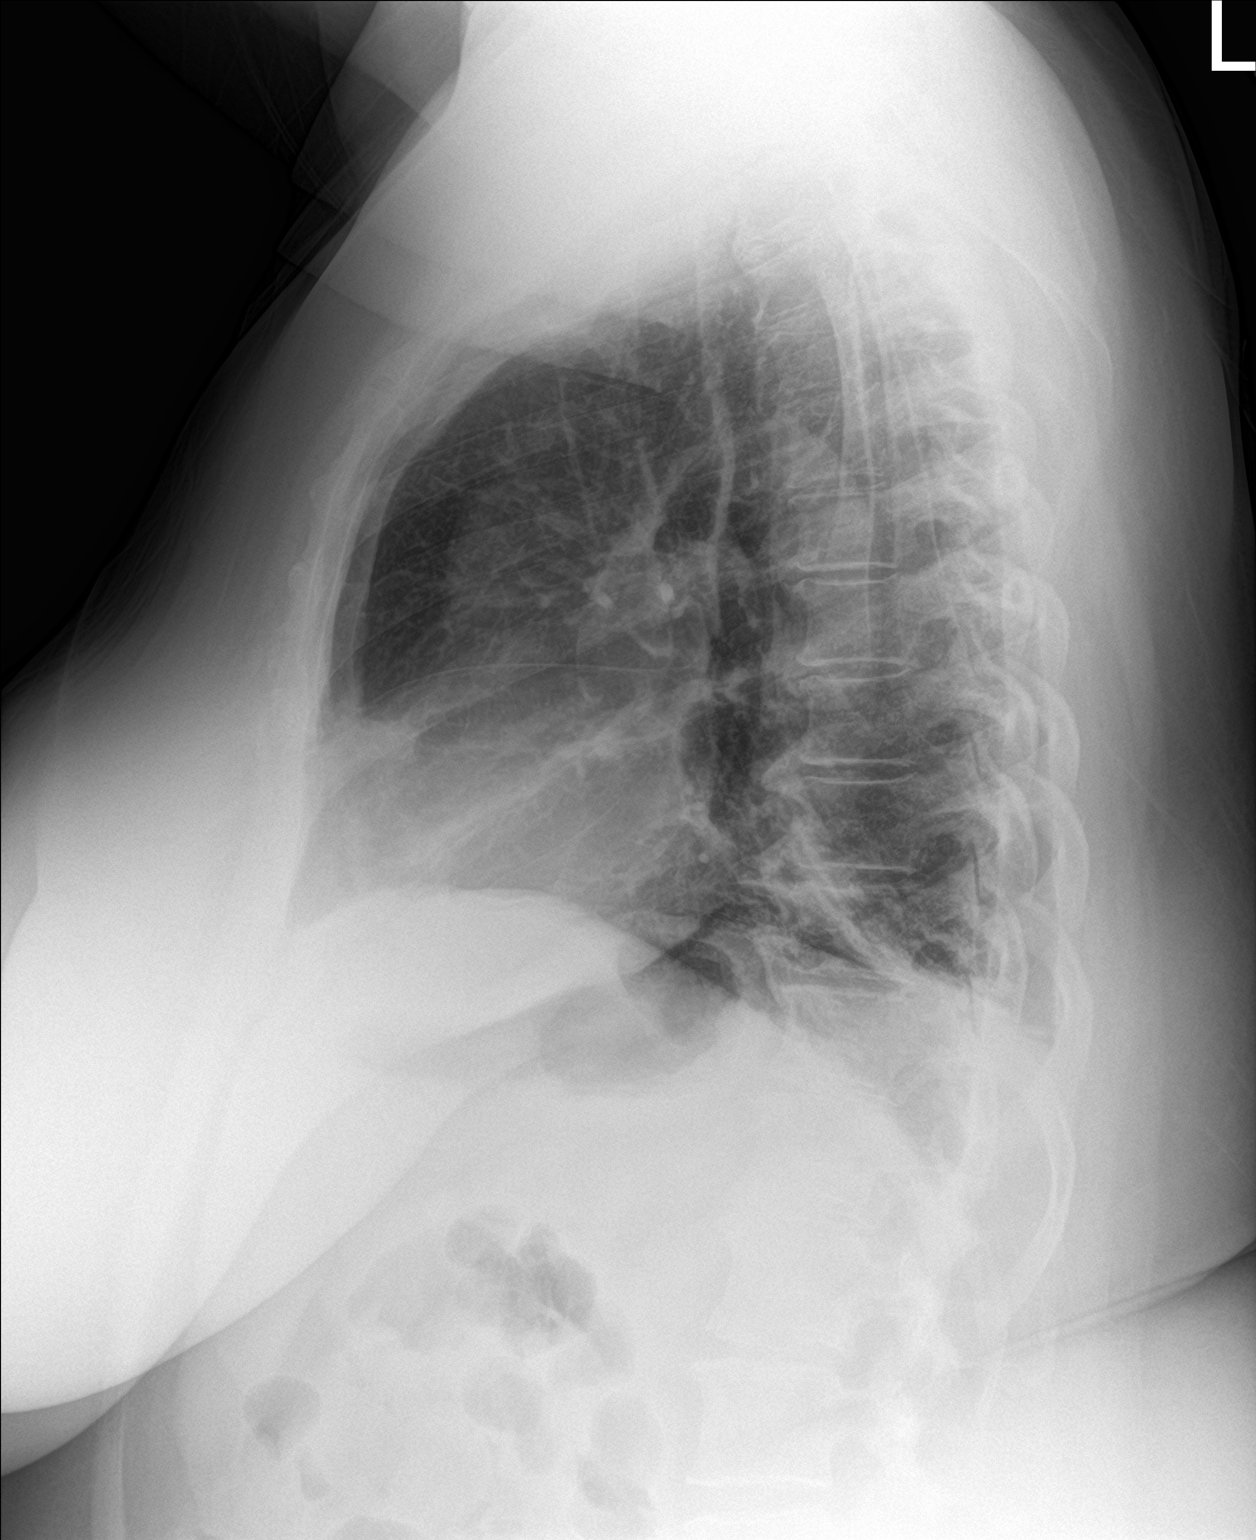

[2 of 2 positions shown; findings below may reference images not displayed]

FINDINGS: Parenchymal changes left lower lobe and lingula. Appearance
suggestive of atelectasis although infiltrate not excluded. Close
follow-up until clearance recommended to exclude underlying
obstructing lesion.

No pulmonary edema or pneumothorax.

Slightly tortuous aorta.

Heart size within normal limits.

Mild degenerative changes thoracic spine with slight curvature.
Right shoulder joint degenerative changes.
IMPRESSION: Parenchymal changes left lower lobe and lingula. Appearance
suggestive of atelectasis although infiltrate not excluded. Close
follow-up until clearance recommended to exclude underlying
obstructing lesion.

Slightly tortuous aorta.

## 2018-10-12 MED ORDER — HYDROCODONE-ACETAMINOPHEN 5-325 MG PO TABS: 1.0000 | ORAL_TABLET | Freq: Three times a day (TID) | ORAL | 0 refills | Status: DC | PRN

## 2018-10-21 ENCOUNTER — Other Ambulatory Visit: Payer: Self-pay | Admitting: Internal Medicine

## 2018-10-28 ENCOUNTER — Other Ambulatory Visit: Payer: Self-pay | Admitting: Internal Medicine

## 2018-10-29 NOTE — Telephone Encounter (Signed)
[place] Controlled Database Checked Last filled:09/28/18 # 30 LOV w/you: 09/07/18 Next appt w/you: 12/08/18

## 2018-11-11 ENCOUNTER — Other Ambulatory Visit: Payer: Self-pay | Admitting: Internal Medicine

## 2018-11-12 ENCOUNTER — Telehealth: Payer: Self-pay | Admitting: Internal Medicine

## 2018-11-12 ENCOUNTER — Other Ambulatory Visit: Payer: Self-pay

## 2018-11-12 ENCOUNTER — Ambulatory Visit
Admission: RE | Admit: 2018-11-12 | Discharge: 2018-11-12 | Disposition: A | Discharge status: Home / Self Care | Payer: BC Managed Care – PPO | Source: Ambulatory Visit | Attending: Obstetrics & Gynecology | Admitting: Obstetrics & Gynecology

## 2018-11-12 DIAGNOSIS — Z1231 Encounter for screening mammogram for malignant neoplasm of breast: Secondary | ICD-10-CM

## 2018-11-12 DIAGNOSIS — M8949 Other hypertrophic osteoarthropathy, multiple sites: Secondary | ICD-10-CM

## 2018-11-12 DIAGNOSIS — M159 Polyosteoarthritis, unspecified: Secondary | ICD-10-CM

## 2018-11-12 MED ORDER — HYDROCODONE-ACETAMINOPHEN 5-325 MG PO TABS: 1.0000 | ORAL_TABLET | Freq: Three times a day (TID) | ORAL | 0 refills | Status: DC | PRN

## 2018-11-12 NOTE — Telephone Encounter (Signed)
The [facility].  Please make sure that the patient sees me every 3 months to see me.  Thank you

## 2018-11-12 NOTE — Telephone Encounter (Signed)
Check [place] registry last filled 10/12/2018.Marland KitchenJohny Chess

## 2018-11-12 NOTE — Telephone Encounter (Signed)
Medication Refill - Medication: HYDROcodone-acetaminophen (NORCO/VICODIN) 5-325 MG tablet    Preferred Pharmacy (with phone number or street name):  [id number] - [place], [place] - [address] [phone] (Phone) [phone] (Fax)

## 2018-11-15 NOTE — Telephone Encounter (Signed)
Patient is schedule 12/08/18 with PCP.

## 2018-12-02 ENCOUNTER — Other Ambulatory Visit: Payer: Self-pay

## 2018-12-02 DIAGNOSIS — Z20822 Contact with and (suspected) exposure to covid-19: Secondary | ICD-10-CM

## 2018-12-04 LAB — NOVEL CORONAVIRUS, NAA: SARS-CoV-2, NAA: NOT DETECTED

## 2018-12-08 ENCOUNTER — Ambulatory Visit (INDEPENDENT_AMBULATORY_CARE_PROVIDER_SITE_OTHER): Payer: BC Managed Care – PPO | Admitting: Internal Medicine

## 2018-12-08 ENCOUNTER — Encounter: Payer: Self-pay | Admitting: Internal Medicine

## 2018-12-08 ENCOUNTER — Other Ambulatory Visit: Payer: Self-pay

## 2018-12-08 VITALS — BP 130/86 | HR 103 | Temp 98.2°F | Ht 65.5 in | Wt 262.0 lb

## 2018-12-08 DIAGNOSIS — I10 Essential (primary) hypertension: Secondary | ICD-10-CM | POA: Diagnosis not present

## 2018-12-08 DIAGNOSIS — M25511 Pain in right shoulder: Secondary | ICD-10-CM

## 2018-12-08 DIAGNOSIS — M150 Primary generalized (osteo)arthritis: Secondary | ICD-10-CM | POA: Diagnosis not present

## 2018-12-08 DIAGNOSIS — M8949 Other hypertrophic osteoarthropathy, multiple sites: Secondary | ICD-10-CM

## 2018-12-08 DIAGNOSIS — G8929 Other chronic pain: Secondary | ICD-10-CM

## 2018-12-08 DIAGNOSIS — M159 Polyosteoarthritis, unspecified: Secondary | ICD-10-CM

## 2018-12-08 DIAGNOSIS — M25561 Pain in right knee: Secondary | ICD-10-CM | POA: Diagnosis not present

## 2018-12-08 DIAGNOSIS — M5441 Lumbago with sciatica, right side: Secondary | ICD-10-CM | POA: Diagnosis not present

## 2018-12-08 MED ORDER — HYDROCODONE-ACETAMINOPHEN 5-325 MG PO TABS: 1.0000 | ORAL_TABLET | Freq: Three times a day (TID) | ORAL | 0 refills | Status: DC | PRN

## 2018-12-08 NOTE — Patient Instructions (Addendum)
Postprocedure instructions :    A Band-Aid should be left on for 12 hours. Injection therapy is not a cure itself. It is used in conjunction with other modalities. You can use nonsteroidal anti-inflammatories like ibuprofen , hot and cold compresses. Rest is recommended in the next 24 hours. You need to report immediately  if fever, chills or any signs of infection develop.   These suggestions will probably help you to improve your metabolism if you are not overweight and to lose weight if you are overweight: 1.  Reduce your consumption of sugars and starches.  Eliminate high fructose corn syrup from your diet.  Reduce your consumption of processed foods.  For desserts try to have seasonal fruits, berries, nuts, cheeses or dark chocolate with more than 70% cacao. 2.  Do not snack 3.  You do not have to eat breakfast.  If you choose to have breakfast-eat plain greek yogurt, eggs, oatmeal (without sugar) 4.  Drink water, freshly brewed unsweetened tea (green, black or herbal) or coffee.  Do not drink sodas including diet sodas , juices, beverages sweetened with artificial sweeteners. 5.  Reduce your consumption of refined grains. 6.  Avoid protein drinks such as Optifast, Slim fast etc. Eat chicken, fish, meat, dairy and beans for your sources of protein 7.  Natural unprocessed fats like cold pressed virgin olive oil, butter, coconut oil are good for you.  Eat avocados 8.  Increase your consumption of fiber.  Fruits, berries, vegetables, whole grains, flaxseeds, Chia seeds, beans, popcorn, nuts, oatmeal are good sources of fiber 9.  Use vinegar in your diet, i.e. apple cider vinegar, red wine or balsamic vinegar 10.  You can try fasting.  For example you can skip breakfast and lunch every other day (24-hour fast) 11.  Stress reduction, good night sleep, relaxation, meditation, yoga and other physical activity is likely to help you to maintain low weight too. 12.  If you drink alcohol, limit your  alcohol intake to no more than 2 drinks a day.   Mediterranean diet is good for you. ([employer] has a typical Mediterranean cuisine menu) The Mediterranean diet is a way of eating based on the traditional cuisine of countries bordering the [place] Sea. While there is no single definition of the Mediterranean diet, it is typically high in vegetables, fruits, whole grains, beans, nut and seeds, and olive oil. The main components of Mediterranean diet include: . Daily consumption of vegetables, fruits, whole grains and healthy fats  . Weekly intake of fish, poultry, beans and eggs  . Moderate portions of dairy products  . Limited intake of red meat Other important elements of the Mediterranean diet are sharing meals with family and friends, enjoying a glass of red wine and being physically active. Health benefits of a Mediterranean diet: A traditional Mediterranean diet consisting of large quantities of fresh fruits and vegetables, nuts, fish and olive oil-coupled with physical activity-can reduce your risk of serious mental and physical health problems by: Preventing heart disease and strokes. Following a Mediterranean diet limits your intake of refined breads, processed foods, and red meat, and encourages drinking red wine instead of hard liquor-all factors that can help prevent heart disease and stroke. Keeping you agile. If you're an older adult, the nutrients gained with a Mediterranean diet may reduce your risk of developing muscle weakness and other signs of frailty by about 70 percent. Reducing the risk of Alzheimer's. Research suggests that the Mediterranean diet may improve cholesterol, blood sugar levels, and   overall blood vessel health, which in turn may reduce your risk of Alzheimer's disease or dementia. Halving the risk of Parkinson's disease. The high levels of antioxidants in the Mediterranean diet can prevent cells from undergoing a damaging process called oxidative stress,  thereby cutting the risk of Parkinson's disease in half. Increasing longevity. By reducing your risk of developing heart disease or cancer with the Mediterranean diet, you're reducing your risk of death at any age by 20%. Protecting against type 2 diabetes. A Mediterranean diet is rich in fiber which digests slowly, prevents huge swings in blood sugar, and can help you maintain a healthy weight.    Cabbage soup recipe that will not make you gain weight: Take 1 small head of cabbage, 1 average pack of celery, 4 green peppers, 4 onions, 2 cans diced tomatoes (they are not available without salt), salt and spices to taste.  Chop cabbage, celery, peppers and onions.  And tomatoes and 2-2.5 liters (2.5 quarts) of water so that it would just cover the vegetables.  Bring to boil.  Add spices and salt.  Turn heat to low/medium and simmer for 20-25 minutes.  Naturally, you can make a smaller batch and change some of the ingredients.  

## 2018-12-08 NOTE — Assessment & Plan Note (Signed)
Valsartan HCT Risks associated with treatment noncompliance were discussed. Compliance was encouraged.

## 2018-12-08 NOTE — Assessment & Plan Note (Signed)
Worse Will inject per the pt's request

## 2018-12-08 NOTE — Assessment & Plan Note (Signed)
Norco prn  Potential benefits of a long term opioids use as well as potential risks (i.e. addiction risk, apnea etc) and complications (i.e. Somnolence, constipation and others) were explained to the patient and were aknowledged. 

## 2018-12-08 NOTE — Progress Notes (Signed)
Subjective:  Patient ID: Jessica Lynn, female    DOB: [birth year]  Age: 58 y.o. MRN: [id number]  CC: No chief complaint on file.   HPI LAKELA HELMLINGER presents for LBP, anxiety, HTN C/o shoulders and knee pain-severe  Outpatient Medications Prior to Visit  Medication Sig Dispense Refill  . Adalimumab 40 MG/0.8ML PNKT Inject 40 mg into the skin.    . Cholecalciferol (VITAMIN D3) 2000 units capsule Take 1 capsule (2,000 Units total) by mouth daily. 100 capsule 3  . clonazePAM (KLONOPIN) 1 MG tablet Take 1/2 to 1  Tablet by mouth two times daily as needed for nerves 60 tablet 3  . dorzolamide-timolol (COSOPT) 22.3-6.8 MG/ML ophthalmic solution Place 1 drop into both eyes 2 (two) times daily. 10 mL 12  . folic acid (FOLVITE) 1 MG tablet Take 1 mg by mouth daily.    [name] HYDROcodone-acetaminophen (NORCO/VICODIN) 5-325 MG tablet Take 1 tablet by mouth 3 (three) times daily as needed for severe pain. 90 tablet 0  . hydrOXYzine (ATARAX/VISTARIL) 25 MG tablet TAKE 1 TABLET (25 MG TOTAL) BY MOUTH EVERY 8 (EIGHT) HOURS AS NEEDED FOR ITCHING. 90 tablet 3  . montelukast (SINGULAIR) 10 MG tablet Take 1 tablet (10 mg total) by mouth daily. 90 tablet 3  . naproxen (NAPROSYN) 500 MG tablet TAKE 1 TABLET BY MOUTH TWICE DAILY WITH FOOD FOR MODERATE PAIN 60 tablet 3  . rosuvastatin (CRESTOR) 20 MG tablet Take 1 tablet (20 mg total) by mouth daily. 30 tablet 11  . valsartan-hydrochlorothiazide (DIOVAN-HCT) 160-12.5 MG tablet TAKE 1 TABLET BY MOUTH DAILY 30 tablet 11  . zolpidem (AMBIEN) 10 MG tablet TAKE 1 TABLET BY MOUTH AT BEDTIME AS NEEDED FOR SLEEP 30 tablet 2   No facility-administered medications prior to visit.     ROS: Review of Systems  Constitutional: Positive for fatigue and unexpected weight change. Negative for activity change, appetite change and chills.  HENT: Negative for congestion, mouth sores and sinus pressure.   Eyes: Negative for visual disturbance.  Respiratory: Negative for cough  and chest tightness.   Gastrointestinal: Negative for abdominal pain and nausea.  Genitourinary: Negative for difficulty urinating, frequency and vaginal pain.  Musculoskeletal: Positive for arthralgias, back pain and gait problem.  Skin: Negative for pallor and rash.  Neurological: Negative for dizziness, tremors, weakness, numbness and headaches.  Psychiatric/Behavioral: Negative for confusion, sleep disturbance and suicidal ideas.    Objective:  Ht 5' 5.5" (1.664 m)   LMP 06/06/2015 Comment: irregular   BMI 40.97 kg/m   BP Readings from Last 3 Encounters:  09/29/18 (!) 142/82  09/07/18 126/84  04/08/18 130/82    Wt Readings from Last 3 Encounters:  09/29/18 250 lb (113.4 kg)  09/15/18 250 lb (113.4 kg)  09/07/18 255 lb (115.7 kg)    Physical Exam Constitutional:      General: She is not in acute distress.    Appearance: She is well-developed.  HENT:     Head: Normocephalic.     Right Ear: External ear normal.     Left Ear: External ear normal.     Nose: Nose normal.  Eyes:     General:        Right eye: No discharge.        Left eye: No discharge.     Conjunctiva/sclera: Conjunctivae normal.     Pupils: Pupils are equal, round, and reactive to light.  Neck:     Musculoskeletal: Normal range of motion and neck supple.  Thyroid: No thyromegaly.     Vascular: No JVD.     Trachea: No tracheal deviation.  Cardiovascular:     Rate and Rhythm: Normal rate and regular rhythm.     Heart sounds: Normal heart sounds.  Pulmonary:     Effort: No respiratory distress.     Breath sounds: No stridor. No wheezing.  Abdominal:     General: Bowel sounds are normal. There is no distension.     Palpations: Abdomen is soft. There is no mass.     Tenderness: There is no abdominal tenderness. There is no guarding or rebound.  Musculoskeletal:        General: Tenderness present.  Lymphadenopathy:     Cervical: No cervical adenopathy.  Skin:    Findings: No erythema or rash.   Neurological:     Cranial Nerves: No cranial nerve deficit.     Motor: No abnormal muscle tone.     Coordination: Coordination normal.     Deep Tendon Reflexes: Reflexes normal.  Psychiatric:        Behavior: Behavior normal.        Thought Content: Thought content normal.        Judgment: Judgment normal.   Both shoulders are painful more on the right Right knee is painful   Procedure Note :     Procedure : Joint Injection, R  knee   Indication:  Joint osteoarthritis with refractory  chronic pain.   Risks including unsuccessful procedure , bleeding, infection, bruising, skin atrophy, "steroid flare-up" and others were explained to the patient in detail as well as the benefits. Informed consent was obtained and signed.   Tthe patient was placed in a comfortable position. Lateral approach was used. Skin was prepped with Betadine and alcohol  and anesthetized a cooling spray. Then, a 5 cc syringe with a 1.5 inch long 25-gauge needle was used for a joint injection.. The needle was advanced  Into the knee joint cavity. I aspirated a small amount of intra-articular fluid to confirm correct placement of the needle and injected the joint with 5 mL of 2% lidocaine and 40 mg of Depo-Medrol .  Band-Aid was applied.   Tolerated well. Complications: None. Good pain relief following the procedure.   Postprocedure instructions :    A Band-Aid should be left on for 12 hours. Injection therapy is not a cure itself. It is used in conjunction with other modalities. You can use nonsteroidal anti-inflammatories like ibuprofen , hot and cold compresses. Rest is recommended in the next 24 hours. You need to report immediately  if fever, chills or any signs of infection develop.   Procedure :Joint Injection,  R shoulder   Indication:  Subacromial bursitis with refractory  chronic pain.   Risks including unsuccessful procedure , bleeding, infection, bruising, skin atrophy, "steroid flare-up" and others  were explained to the patient in detail as well as the benefits. Informed consent was obtained and signed.   Tthe patient was placed in a comfortable position. Lateral approach was used. Skin was prepped with Betadine and alcohol  and anesthetized with a cooling spray. Then, a 5 cc syringe with a 2 inch long 24-gauge needle was used for a joint injection.. The needle was advanced  Into the subacromial space.The bursa was injected with 3 mL of 2% lidocaine and 40 mg of Depo-Medrol .  Band-Aid was applied.   Tolerated well. Complications: None. Good pain relief following the procedure.   Postprocedure instructions :  A Band-Aid should be left on for 12 hours. Injection therapy is not a cure itself. It is used in conjunction with other modalities. You can use nonsteroidal anti-inflammatories like ibuprofen , hot and cold compresses. Rest is recommended in the next 24 hours. You need to report immediately  if fever, chills or any signs of infection develop.   Lab Results  Component Value Date   WBC 8.6 09/07/2018   HGB 13.1 09/07/2018   HCT 38.6 09/07/2018   PLT 322.0 09/07/2018   GLUCOSE 95 09/07/2018   CHOL 271 (H) 09/07/2018   TRIG 78.0 09/07/2018   HDL 72.20 09/07/2018   LDLDIRECT 160.7 02/06/2012   LDLCALC 183 (H) 09/07/2018   ALT 15 09/07/2018   AST 18 09/07/2018   NA 138 09/07/2018   K 3.7 09/07/2018   CL 103 09/07/2018   CREATININE 0.75 09/07/2018   BUN 19 09/07/2018   CO2 27 09/07/2018   TSH 0.74 09/07/2018    Mm 3d Screen Breast Bilateral  Result Date: 11/12/2018 CLINICAL DATA:  Screening. EXAM: DIGITAL SCREENING BILATERAL MAMMOGRAM WITH TOMO AND CAD COMPARISON:  Previous exam(s). ACR Breast Density Category b: There are scattered areas of fibroglandular density. FINDINGS: There are no findings suspicious for malignancy. Images were processed with CAD. IMPRESSION: No mammographic evidence of malignancy. A result letter of this screening mammogram will be mailed directly to  the patient. RECOMMENDATION: Screening mammogram in one year. (Code:SM-B-01Y) BI-RADS CATEGORY  1: Negative. Electronically Signed   By: Lillia Mountain M.D.   On: 11/12/2018 15:09    Assessment & Plan:   There are no diagnoses linked to this encounter.   No orders of the defined types were placed in this encounter.    Follow-up: No follow-ups on file.  Walker Kehr, MD

## 2018-12-09 ENCOUNTER — Encounter: Payer: Self-pay | Admitting: Internal Medicine

## 2018-12-29 ENCOUNTER — Other Ambulatory Visit: Payer: Self-pay | Admitting: Internal Medicine

## 2019-01-06 ENCOUNTER — Other Ambulatory Visit: Payer: Self-pay | Admitting: Internal Medicine

## 2019-01-06 DIAGNOSIS — M159 Polyosteoarthritis, unspecified: Secondary | ICD-10-CM

## 2019-01-06 DIAGNOSIS — M8949 Other hypertrophic osteoarthropathy, multiple sites: Secondary | ICD-10-CM

## 2019-01-06 NOTE — Telephone Encounter (Signed)
Pt is requesting a refill for HYDROcodone-acetaminophen (NORCO/VICODIN) 5-325 MG tablet    Pharmacy:  [id number] - [place], [place] - [address] AT [place] [phone] (Phone) [phone] (Fax)

## 2019-01-08 MED ORDER — HYDROCODONE-ACETAMINOPHEN 5-325 MG PO TABS: 1.0000 | ORAL_TABLET | Freq: Three times a day (TID) | ORAL | 0 refills | Status: DC | PRN

## 2019-01-10 ENCOUNTER — Other Ambulatory Visit: Payer: Self-pay | Admitting: Internal Medicine

## 2019-01-10 NOTE — Telephone Encounter (Signed)
Please send to correct pharmacy. Thank you

## 2019-01-10 NOTE — Telephone Encounter (Signed)
Patient states this was sent to the wrong  Pharmacy.  Patient need medications sent to  [id number] [place], [place] Patient would like to see if we can stop the prescription sent to [facility]   Patient Call back [phone]

## 2019-01-10 NOTE — Addendum Note (Signed)
Addended by: Karren Cobble on: 01/10/2019 11:47 AM   Modules accepted: Orders

## 2019-01-14 MED ORDER — HYDROCODONE-ACETAMINOPHEN 5-325 MG PO TABS: 1.0000 | ORAL_TABLET | Freq: Three times a day (TID) | ORAL | 0 refills | Status: DC | PRN

## 2019-01-31 ENCOUNTER — Other Ambulatory Visit: Payer: Self-pay | Admitting: Internal Medicine

## 2019-01-31 NOTE — Telephone Encounter (Signed)
[place] Controlled Database Checked Last filled: 01/01/19 # 30 LOV w/you: 09/07/18 Next appt w/you: 03/09/19

## 2019-02-14 ENCOUNTER — Other Ambulatory Visit: Payer: Self-pay | Admitting: Internal Medicine

## 2019-02-14 DIAGNOSIS — M8949 Other hypertrophic osteoarthropathy, multiple sites: Secondary | ICD-10-CM

## 2019-02-14 DIAGNOSIS — M159 Polyosteoarthritis, unspecified: Secondary | ICD-10-CM

## 2019-02-14 NOTE — Telephone Encounter (Signed)
Medication Refill - Medication:  HYDROcodone-acetaminophen (NORCO/VICODIN) 5-325 MG tablet   Has the patient contacted their pharmacy?  Yes advised to call office.   Preferred Pharmacy (with phone number or street name):  [id number] - [place], [place] - [address] AT [address] [phone] (Phone) [phone] (Fax)   Agent: Please be advised that RX refills may take up to 3 business days. We ask that you follow-up with your pharmacy.

## 2019-02-14 NOTE — Telephone Encounter (Signed)
Requested medication (s) are due for refill today: yes  Requested medication (s) are on the active medication list: yes  Last refill:  01/14/2019  Future visit scheduled: yes  Notes to clinic:  Refill cannot be delegated    Requested Prescriptions  Pending Prescriptions Disp Refills   HYDROcodone-acetaminophen (NORCO/VICODIN) 5-325 MG tablet 90 tablet 0    Sig: Take 1 tablet by mouth 3 (three) times daily as needed for severe pain.     Not Delegated - Analgesics:  Opioid Agonist Combinations Failed - 02/14/2019  9:24 AM      Failed - This refill cannot be delegated      Failed - Urine Drug Screen completed in last 360 days.      Passed - Valid encounter within last 6 months    Recent Outpatient Visits          2 months ago Right-sided low back pain with right-sided sciatica, unspecified chronicity   [place], Evie Lacks, MD   5 months ago Well adult exam   [place] Plotnikov, Evie Lacks, MD   6 months ago Essential hypertension   [place], Evie Lacks, MD   10 months ago Primary osteoarthritis involving multiple joints   [place], Evie Lacks, MD   1 year ago Need for influenza vaccination   [place], MD      Future Appointments            In 3 weeks Plotnikov, Evie Lacks, MD [place], [place]

## 2019-02-15 MED ORDER — HYDROCODONE-ACETAMINOPHEN 5-325 MG PO TABS: 1.0000 | ORAL_TABLET | Freq: Three times a day (TID) | ORAL | 0 refills | Status: DC | PRN

## 2019-03-09 ENCOUNTER — Other Ambulatory Visit: Payer: Self-pay

## 2019-03-09 ENCOUNTER — Ambulatory Visit (INDEPENDENT_AMBULATORY_CARE_PROVIDER_SITE_OTHER): Payer: BC Managed Care – PPO | Admitting: Internal Medicine

## 2019-03-09 ENCOUNTER — Encounter: Payer: Self-pay | Admitting: Internal Medicine

## 2019-03-09 DIAGNOSIS — F419 Anxiety disorder, unspecified: Secondary | ICD-10-CM

## 2019-03-09 DIAGNOSIS — E559 Vitamin D deficiency, unspecified: Secondary | ICD-10-CM

## 2019-03-09 DIAGNOSIS — M5441 Lumbago with sciatica, right side: Secondary | ICD-10-CM | POA: Diagnosis not present

## 2019-03-09 DIAGNOSIS — G8929 Other chronic pain: Secondary | ICD-10-CM

## 2019-03-09 DIAGNOSIS — M25561 Pain in right knee: Secondary | ICD-10-CM | POA: Diagnosis not present

## 2019-03-09 DIAGNOSIS — M8949 Other hypertrophic osteoarthropathy, multiple sites: Secondary | ICD-10-CM

## 2019-03-09 DIAGNOSIS — M159 Polyosteoarthritis, unspecified: Secondary | ICD-10-CM

## 2019-03-09 DIAGNOSIS — M25562 Pain in left knee: Secondary | ICD-10-CM

## 2019-03-09 MED ORDER — FOLIC ACID 1 MG PO TABS: 1.0000 mg | ORAL_TABLET | Freq: Every day | ORAL | 3 refills | Status: DC

## 2019-03-09 MED ORDER — HYDROCODONE-ACETAMINOPHEN 5-325 MG PO TABS: 1.0000 | ORAL_TABLET | Freq: Three times a day (TID) | ORAL | 0 refills | Status: DC | PRN

## 2019-03-09 NOTE — Assessment & Plan Note (Signed)
Vit D 

## 2019-03-09 NOTE — Assessment & Plan Note (Signed)
Clonazepam prn Not to take w/Norco  Potential benefits of a long term benzodiazepines  use as well as potential risks  and complications were explained to the patient and were aknowledged.  

## 2019-03-09 NOTE — Assessment & Plan Note (Signed)
Norco prn  Potential benefits of a long term opioids use as well as potential risks (i.e. addiction risk, apnea etc) and complications (i.e. Somnolence, constipation and others) were explained to the patient and were aknowledged. 

## 2019-03-09 NOTE — Assessment & Plan Note (Addendum)
L>R s/p fall x2 Norco prn  Potential benefits of a long term opioids use as well as potential risks (i.e. addiction risk, apnea etc) and complications (i.e. Somnolence, constipation and others) were explained to the patient and were aknowledged.

## 2019-03-09 NOTE — Progress Notes (Signed)
Hello      Subjective:  Patient ID: Jessica Lynn, female    DOB: [birth year]  Age: 58 y.o. MRN: [id number]  CC: No chief complaint on file.   HPI CLAIRA OLIVA presents for OA, LBP The pt fell twice, last time at school - c/o pain in both knees. The pt went to [facility] on 02/22/19 C/o LBP  Outpatient Medications Prior to Visit  Medication Sig Dispense Refill  . Cholecalciferol (VITAMIN D3) 2000 units capsule Take 1 capsule (2,000 Units total) by mouth daily. 100 capsule 3  . clonazePAM (KLONOPIN) 1 MG tablet Take 1/2 to 1  Tablet by mouth two times daily as needed for nerves 60 tablet 3  . dorzolamide-timolol (COSOPT) 22.3-6.8 MG/ML ophthalmic solution Place 1 drop into both eyes 2 (two) times daily. 10 mL 12  . HYDROcodone-acetaminophen (NORCO/VICODIN) 5-325 MG tablet Take 1 tablet by mouth 3 (three) times daily as needed for severe pain. 90 tablet 0  . hydrOXYzine (ATARAX/VISTARIL) 25 MG tablet TAKE 1 TABLET (25 MG TOTAL) BY MOUTH EVERY 8 (EIGHT) HOURS AS NEEDED FOR ITCHING. 90 tablet 3  . montelukast (SINGULAIR) 10 MG tablet Take 1 tablet (10 mg total) by mouth daily. 90 tablet 3  . naproxen (NAPROSYN) 500 MG tablet TAKE 1 TABLET BY MOUTH TWICE DAILY WITH FOOD FOR MODERATE PAIN 60 tablet 3  . rosuvastatin (CRESTOR) 20 MG tablet TAKE 1 TABLET(20 MG) BY MOUTH DAILY 30 tablet 11  . valsartan-hydrochlorothiazide (DIOVAN-HCT) 160-12.5 MG tablet TAKE 1 TABLET BY MOUTH DAILY 30 tablet 11  . zolpidem (AMBIEN) 10 MG tablet TAKE 1 TABLET BY MOUTH AT BEDTIME AS NEEDED FOR SLEEP 30 tablet 2  . Adalimumab 40 MG/0.8ML PNKT Inject 40 mg into the skin.    . folic acid (FOLVITE) 1 MG tablet Take 1 mg by mouth daily.     No facility-administered medications prior to visit.     ROS: Review of Systems  Constitutional: Positive for fatigue. Negative for activity change, appetite change, chills and unexpected weight change.  HENT: Negative for congestion, mouth sores and sinus pressure.   Eyes:  Negative for visual disturbance.  Respiratory: Negative for cough and chest tightness.   Gastrointestinal: Negative for abdominal pain and nausea.  Genitourinary: Negative for difficulty urinating, frequency and vaginal pain.  Musculoskeletal: Positive for arthralgias, back pain and gait problem.  Skin: Negative for pallor and rash.  Neurological: Negative for dizziness, tremors, weakness, numbness and headaches.  Psychiatric/Behavioral: Negative for confusion and sleep disturbance. The patient is nervous/anxious.     Objective:  BP 126/82 (BP Location: Left Arm, Patient Position: Sitting, Cuff Size: Large)   Pulse 77   Temp (!) 97.4 F (36.3 C) (Oral)   Ht 5' 5.5" (1.664 m)   Wt 258 lb (117 kg)   LMP 06/06/2015 Comment: irregular   SpO2 99%   BMI 42.28 kg/m   BP Readings from Last 3 Encounters:  03/09/19 126/82  12/08/18 130/86  09/29/18 (!) 142/82    Wt Readings from Last 3 Encounters:  03/09/19 258 lb (117 kg)  12/08/18 262 lb (118.8 kg)  09/29/18 250 lb (113.4 kg)    Physical Exam Constitutional:      General: She is not in acute distress.    Appearance: She is well-developed. She is obese.  HENT:     Head: Normocephalic.     Right Ear: External ear normal.     Left Ear: External ear normal.     Nose: Nose normal.  Eyes:     General:        Right eye: No discharge.        Left eye: No discharge.     Conjunctiva/sclera: Conjunctivae normal.     Pupils: Pupils are equal, round, and reactive to light.  Neck:     Musculoskeletal: Normal range of motion and neck supple.     Thyroid: No thyromegaly.     Vascular: No JVD.     Trachea: No tracheal deviation.  Cardiovascular:     Rate and Rhythm: Normal rate and regular rhythm.     Heart sounds: Normal heart sounds.  Pulmonary:     Effort: No respiratory distress.     Breath sounds: No stridor. No wheezing.  Abdominal:     General: Bowel sounds are normal. There is no distension.     Palpations: Abdomen is  soft. There is no mass.     Tenderness: There is no abdominal tenderness. There is no guarding or rebound.  Musculoskeletal:        General: Tenderness present.  Lymphadenopathy:     Cervical: No cervical adenopathy.  Skin:    Findings: No erythema or rash.  Neurological:     Mental Status: She is oriented to person, place, and time.     Cranial Nerves: No cranial nerve deficit.     Motor: No abnormal muscle tone.     Coordination: Coordination normal.     Deep Tendon Reflexes: Reflexes normal.  Psychiatric:        Behavior: Behavior normal.        Thought Content: Thought content normal.        Judgment: Judgment normal.   pain in B knees, LS spine w/ROM  Lab Results  Component Value Date   WBC 8.6 09/07/2018   HGB 13.1 09/07/2018   HCT 38.6 09/07/2018   PLT 322.0 09/07/2018   GLUCOSE 95 09/07/2018   CHOL 271 (H) 09/07/2018   TRIG 78.0 09/07/2018   HDL 72.20 09/07/2018   LDLDIRECT 160.7 02/06/2012   LDLCALC 183 (H) 09/07/2018   ALT 15 09/07/2018   AST 18 09/07/2018   NA 138 09/07/2018   K 3.7 09/07/2018   CL 103 09/07/2018   CREATININE 0.75 09/07/2018   BUN 19 09/07/2018   CO2 27 09/07/2018   TSH 0.74 09/07/2018    Mm 3d Screen Breast Bilateral  Result Date: 11/12/2018 CLINICAL DATA:  Screening. EXAM: DIGITAL SCREENING BILATERAL MAMMOGRAM WITH TOMO AND CAD COMPARISON:  Previous exam(s). ACR Breast Density Category b: There are scattered areas of fibroglandular density. FINDINGS: There are no findings suspicious for malignancy. Images were processed with CAD. IMPRESSION: No mammographic evidence of malignancy. A result letter of this screening mammogram will be mailed directly to the patient. RECOMMENDATION: Screening mammogram in one year. (Code:SM-B-01Y) BI-RADS CATEGORY  1: Negative. Electronically Signed   By: Lillia Mountain M.D.   On: 11/12/2018 15:09    Assessment & Plan:   There are no diagnoses linked to this encounter.   No orders of the defined types were  placed in this encounter.    Follow-up: No follow-ups on file.  Walker Kehr, MD AP

## 2019-04-07 ENCOUNTER — Other Ambulatory Visit: Payer: Self-pay | Admitting: Internal Medicine

## 2019-04-07 DIAGNOSIS — M8949 Other hypertrophic osteoarthropathy, multiple sites: Secondary | ICD-10-CM

## 2019-04-07 DIAGNOSIS — M159 Polyosteoarthritis, unspecified: Secondary | ICD-10-CM

## 2019-04-07 NOTE — Telephone Encounter (Signed)
Medication Refill: HYDROcodone-acetaminophen (NORCO/VICODIN) 5-325 MG tablet [id number]     Pharmacy:  [id number] - [place], [place] - [address] AT [facility] [address] [facility] [address] Phone:  [phone]  Fax:  [phone]        Pt aware of turn around time.

## 2019-04-07 NOTE — Telephone Encounter (Signed)
Requested medication (s) are due for refill today: yes  Requested medication (s) are on the active medication list: yes  Last refill:  11//2020  Future visit scheduled: yes  Notes to clinic:  not delegated    Requested Prescriptions  Pending Prescriptions Disp Refills   HYDROcodone-acetaminophen (NORCO/VICODIN) 5-325 MG tablet 90 tablet 0    Sig: Take 1 tablet by mouth 3 (three) times daily as needed for severe pain.      Not Delegated - Analgesics:  Opioid Agonist Combinations Failed - 04/07/2019  9:13 AM      Failed - This refill cannot be delegated      Failed - Urine Drug Screen completed in last 360 days.      Passed - Valid encounter within last 6 months    Recent Outpatient Visits           4 weeks ago Vitamin D deficiency   [place] Plotnikov, Evie Lacks, MD   4 months ago Right-sided low back pain with right-sided sciatica, unspecified chronicity   [place], Evie Lacks, MD   7 months ago Well adult exam   [place] Plotnikov, Evie Lacks, MD   8 months ago Essential hypertension   [place], Evie Lacks, MD   12 months ago Primary osteoarthritis involving multiple joints   [place], MD       Future Appointments             In 2 months Plotnikov, Evie Lacks, MD [place] [facility]

## 2019-04-12 MED ORDER — HYDROCODONE-ACETAMINOPHEN 5-325 MG PO TABS: 1.0000 | ORAL_TABLET | Freq: Three times a day (TID) | ORAL | 0 refills | Status: DC | PRN

## 2019-04-13 ENCOUNTER — Ambulatory Visit: Payer: BC Managed Care – PPO | Attending: Internal Medicine

## 2019-04-13 DIAGNOSIS — Z20822 Contact with and (suspected) exposure to covid-19: Secondary | ICD-10-CM

## 2019-04-14 LAB — NOVEL CORONAVIRUS, NAA: SARS-CoV-2, NAA: NOT DETECTED

## 2019-05-09 ENCOUNTER — Other Ambulatory Visit: Payer: Self-pay | Admitting: Internal Medicine

## 2019-05-09 DIAGNOSIS — M8949 Other hypertrophic osteoarthropathy, multiple sites: Secondary | ICD-10-CM

## 2019-05-09 DIAGNOSIS — M159 Polyosteoarthritis, unspecified: Secondary | ICD-10-CM

## 2019-05-09 NOTE — Telephone Encounter (Signed)
        1. Which medications need to be refilled? (please list name of each medication and dose if known)  zolpidem (AMBIEN) 10 MG tablet naproxen (NAPROSYN) 500 MG tablet HYDROcodone-acetaminophen (NORCO/VICODIN) 5-325 MG tablet   2. Which pharmacy/location (including street and city if local pharmacy) is medication to be sent to? [facility] [id number] - [place], [place] - [address]  3. Do they need a 30 day or 90 day supply? [place]

## 2019-05-11 MED ORDER — NAPROXEN 500 MG PO TABS: ORAL_TABLET | ORAL | 3 refills | Status: DC

## 2019-05-11 MED ORDER — HYDROCODONE-ACETAMINOPHEN 5-325 MG PO TABS: 1.0000 | ORAL_TABLET | Freq: Three times a day (TID) | ORAL | 0 refills | Status: DC | PRN

## 2019-05-11 MED ORDER — ZOLPIDEM TARTRATE 10 MG PO TABS: 10.0000 mg | ORAL_TABLET | Freq: Every evening | ORAL | 2 refills | Status: DC | PRN

## 2019-05-11 NOTE — Telephone Encounter (Signed)
Patient calling again about refills.

## 2019-05-12 ENCOUNTER — Other Ambulatory Visit: Payer: Self-pay | Admitting: Internal Medicine

## 2019-05-12 DIAGNOSIS — M159 Polyosteoarthritis, unspecified: Secondary | ICD-10-CM

## 2019-05-12 DIAGNOSIS — M8949 Other hypertrophic osteoarthropathy, multiple sites: Secondary | ICD-10-CM

## 2019-05-12 MED ORDER — NAPROXEN 500 MG PO TABS: ORAL_TABLET | ORAL | 3 refills | Status: DC

## 2019-05-12 MED ORDER — HYDROCODONE-ACETAMINOPHEN 5-325 MG PO TABS: 1.0000 | ORAL_TABLET | Freq: Three times a day (TID) | ORAL | 0 refills | Status: DC | PRN

## 2019-05-12 MED ORDER — ZOLPIDEM TARTRATE 10 MG PO TABS: 10.0000 mg | ORAL_TABLET | Freq: Every evening | ORAL | 2 refills | Status: DC | PRN

## 2019-06-07 ENCOUNTER — Encounter: Payer: Self-pay | Admitting: Internal Medicine

## 2019-06-07 ENCOUNTER — Other Ambulatory Visit: Payer: Self-pay

## 2019-06-07 ENCOUNTER — Ambulatory Visit: Payer: BC Managed Care – PPO | Admitting: Internal Medicine

## 2019-06-07 DIAGNOSIS — E559 Vitamin D deficiency, unspecified: Secondary | ICD-10-CM

## 2019-06-07 DIAGNOSIS — I10 Essential (primary) hypertension: Secondary | ICD-10-CM | POA: Diagnosis not present

## 2019-06-07 DIAGNOSIS — M2550 Pain in unspecified joint: Secondary | ICD-10-CM | POA: Diagnosis not present

## 2019-06-07 DIAGNOSIS — H35063 Retinal vasculitis, bilateral: Secondary | ICD-10-CM | POA: Diagnosis not present

## 2019-06-07 DIAGNOSIS — F419 Anxiety disorder, unspecified: Secondary | ICD-10-CM

## 2019-06-07 NOTE — Assessment & Plan Note (Signed)
sch OV w/Opth

## 2019-06-07 NOTE — Assessment & Plan Note (Signed)
On Vit D 

## 2019-06-07 NOTE — Assessment & Plan Note (Signed)
  Not to take w/Norco  Potential benefits of a long term benzodiazepines  use as well as potential risks  and complications were explained to the patient and were aknowledged.

## 2019-06-07 NOTE — Assessment & Plan Note (Signed)
Valsartan HCT 

## 2019-06-07 NOTE — Progress Notes (Signed)
Subjective:  Patient ID: Jessica Lynn, female    DOB: [birth year]  Age: 59 y.o. MRN: [id number]  CC: No chief complaint on file.   HPI Jessica Lynn presents for LBP, insomnia, anxiety f/u  Outpatient Medications Prior to Visit  Medication Sig Dispense Refill  . Adalimumab 40 MG/0.8ML PNKT Inject 40 mg into the skin.    . Cholecalciferol (VITAMIN D3) 2000 units capsule Take 1 capsule (2,000 Units total) by mouth daily. 100 capsule 3  . clonazePAM (KLONOPIN) 1 MG tablet Take 1/2 to 1  Tablet by mouth two times daily as needed for nerves 60 tablet 3  . dorzolamide-timolol (COSOPT) 22.3-6.8 MG/ML ophthalmic solution Place 1 drop into both eyes 2 (two) times daily. 10 mL 12  . folic acid (FOLVITE) 1 MG tablet Take 1 tablet (1 mg total) by mouth daily. 100 tablet 3  . HYDROcodone-acetaminophen (NORCO/VICODIN) 5-325 MG tablet Take 1 tablet by mouth 3 (three) times daily as needed for severe pain. 90 tablet 0  . hydrOXYzine (ATARAX/VISTARIL) 25 MG tablet TAKE 1 TABLET (25 MG TOTAL) BY MOUTH EVERY 8 (EIGHT) HOURS AS NEEDED FOR ITCHING. 90 tablet 3  . naproxen (NAPROSYN) 500 MG tablet TAKE 1 TABLET BY MOUTH TWICE DAILY WITH FOOD FOR MODERATE PAIN 60 tablet 3  . rosuvastatin (CRESTOR) 20 MG tablet TAKE 1 TABLET(20 MG) BY MOUTH DAILY 30 tablet 11  . valsartan-hydrochlorothiazide (DIOVAN-HCT) 160-12.5 MG tablet TAKE 1 TABLET BY MOUTH DAILY 30 tablet 11  . zolpidem (AMBIEN) 10 MG tablet Take 1 tablet (10 mg total) by mouth at bedtime as needed. for sleep 30 tablet 2  . montelukast (SINGULAIR) 10 MG tablet Take 1 tablet (10 mg total) by mouth daily. 90 tablet 3   No facility-administered medications prior to visit.    ROS: Review of Systems  Constitutional: Positive for fatigue. Negative for activity change, appetite change, chills and unexpected weight change.  HENT: Negative for congestion, mouth sores and sinus pressure.   Eyes: Positive for visual disturbance.  Respiratory: Negative for  cough and chest tightness.   Gastrointestinal: Negative for abdominal pain and nausea.  Genitourinary: Negative for difficulty urinating, frequency and vaginal pain.  Musculoskeletal: Positive for arthralgias, back pain and gait problem.  Skin: Negative for pallor and rash.  Neurological: Negative for dizziness, tremors, weakness, numbness and headaches.  Psychiatric/Behavioral: Negative for confusion and sleep disturbance.    Objective:  BP 124/80 (BP Location: Left Arm, Patient Position: Sitting, Cuff Size: Large)   Pulse 77   Temp 98 F (36.7 C) (Oral)   Ht 5' 5.5" (1.664 m)   Wt 260 lb (117.9 kg)   LMP 06/06/2015 Comment: irregular   SpO2 99%   BMI 42.61 kg/m   BP Readings from Last 3 Encounters:  06/07/19 124/80  03/09/19 126/82  12/08/18 130/86    Wt Readings from Last 3 Encounters:  06/07/19 260 lb (117.9 kg)  03/09/19 258 lb (117 kg)  12/08/18 262 lb (118.8 kg)    Physical Exam Constitutional:      General: She is not in acute distress.    Appearance: She is well-developed. She is obese.  HENT:     Head: Normocephalic.     Right Ear: External ear normal.     Left Ear: External ear normal.     Nose: Nose normal.  Eyes:     General:        Right eye: No discharge.        Left eye: No  discharge.     Conjunctiva/sclera: Conjunctivae normal.     Pupils: Pupils are equal, round, and reactive to light.  Neck:     Thyroid: No thyromegaly.     Vascular: No JVD.     Trachea: No tracheal deviation.  Cardiovascular:     Rate and Rhythm: Normal rate and regular rhythm.     Heart sounds: Normal heart sounds.  Pulmonary:     Effort: No respiratory distress.     Breath sounds: No stridor. No wheezing.  Abdominal:     General: Bowel sounds are normal. There is no distension.     Palpations: Abdomen is soft. There is no mass.     Tenderness: There is no abdominal tenderness. There is no guarding or rebound.  Musculoskeletal:        General: Tenderness present.      Cervical back: Normal range of motion and neck supple.  Lymphadenopathy:     Cervical: No cervical adenopathy.  Skin:    Findings: No erythema or rash.  Neurological:     Mental Status: She is oriented to person, place, and time.     Cranial Nerves: No cranial nerve deficit.     Motor: No abnormal muscle tone.     Coordination: Coordination normal.     Gait: Gait abnormal.     Deep Tendon Reflexes: Reflexes normal.  Psychiatric:        Behavior: Behavior normal.        Thought Content: Thought content normal.        Judgment: Judgment normal.   LS - tendew, knees, shoulders w/pain  Lab Results  Component Value Date   WBC 8.6 09/07/2018   HGB 13.1 09/07/2018   HCT 38.6 09/07/2018   PLT 322.0 09/07/2018   GLUCOSE 95 09/07/2018   CHOL 271 (H) 09/07/2018   TRIG 78.0 09/07/2018   HDL 72.20 09/07/2018   LDLDIRECT 160.7 02/06/2012   LDLCALC 183 (H) 09/07/2018   ALT 15 09/07/2018   AST 18 09/07/2018   NA 138 09/07/2018   K 3.7 09/07/2018   CL 103 09/07/2018   CREATININE 0.75 09/07/2018   BUN 19 09/07/2018   CO2 27 09/07/2018   TSH 0.74 09/07/2018    MM 3D SCREEN BREAST BILATERAL  Result Date: 11/12/2018 CLINICAL DATA:  Screening. EXAM: DIGITAL SCREENING BILATERAL MAMMOGRAM WITH TOMO AND CAD COMPARISON:  Previous exam(s). ACR Breast Density Category b: There are scattered areas of fibroglandular density. FINDINGS: There are no findings suspicious for malignancy. Images were processed with CAD. IMPRESSION: No mammographic evidence of malignancy. A result letter of this screening mammogram will be mailed directly to the patient. RECOMMENDATION: Screening mammogram in one year. (Code:SM-B-01Y) BI-RADS CATEGORY  1: Negative. Electronically Signed   By: Lillia Mountain M.D.   On: 11/12/2018 15:09    Assessment & Plan:   There are no diagnoses linked to this encounter.   No orders of the defined types were placed in this encounter.    Follow-up: No follow-ups on file.  Walker Kehr, MD

## 2019-06-07 NOTE — Assessment & Plan Note (Signed)
Norco prn Loose wt, stretch

## 2019-07-04 ENCOUNTER — Other Ambulatory Visit: Payer: Self-pay

## 2019-07-04 DIAGNOSIS — M8949 Other hypertrophic osteoarthropathy, multiple sites: Secondary | ICD-10-CM

## 2019-07-04 DIAGNOSIS — M159 Polyosteoarthritis, unspecified: Secondary | ICD-10-CM

## 2019-07-04 NOTE — Telephone Encounter (Signed)
Hydrocodone-Acetamin 5-325 Mg  Checked controlled substance database. Last filled 06/04/2019 Last office visit 06/07/2019 Next Office visit 09/07/2019

## 2019-07-04 NOTE — Telephone Encounter (Signed)
1.Medication Requested:HYDROcodone-acetaminophen (NORCO/VICODIN) 5-325 MG tablet  2. Pharmacy (Name, [place], [id number] [place], [place]  3. On Med List: Yes   4. Last Visit with PCP: 3.2.21  5. Next visit date with PCP: 6.2.21    Agent: Please be advised that RX refills may take up to 3 business days. We ask that you follow-up with your pharmacy.

## 2019-07-05 MED ORDER — HYDROCODONE-ACETAMINOPHEN 5-325 MG PO TABS: 1.0000 | ORAL_TABLET | Freq: Three times a day (TID) | ORAL | 0 refills | Status: DC | PRN

## 2019-08-02 ENCOUNTER — Other Ambulatory Visit: Payer: Self-pay | Admitting: Internal Medicine

## 2019-08-02 DIAGNOSIS — M159 Polyosteoarthritis, unspecified: Secondary | ICD-10-CM

## 2019-08-02 DIAGNOSIS — M8949 Other hypertrophic osteoarthropathy, multiple sites: Secondary | ICD-10-CM

## 2019-08-02 NOTE — Telephone Encounter (Signed)
    1.Medication Requested:HYDROcodone-acetaminophen (NORCO/VICODIN) 5-325 MG tablet  2. Pharmacy (Name, [place], City[id number] [place], [place]  3. On Med List: yes  4. Last Visit with PCP: 06/07/19  5. Next visit date with PCP:09/07/19   Agent: Please be advised that RX refills may take up to 3 business days. We ask that you follow-up with your pharmacy.

## 2019-08-03 MED ORDER — HYDROCODONE-ACETAMINOPHEN 5-325 MG PO TABS: 1.0000 | ORAL_TABLET | Freq: Three times a day (TID) | ORAL | 0 refills | Status: DC | PRN

## 2019-08-08 ENCOUNTER — Other Ambulatory Visit: Payer: Self-pay | Admitting: Internal Medicine

## 2019-09-01 ENCOUNTER — Telehealth: Payer: Self-pay

## 2019-09-01 DIAGNOSIS — M8949 Other hypertrophic osteoarthropathy, multiple sites: Secondary | ICD-10-CM

## 2019-09-01 DIAGNOSIS — M159 Polyosteoarthritis, unspecified: Secondary | ICD-10-CM

## 2019-09-01 NOTE — Telephone Encounter (Signed)
1.Medication Requested:HYDROcodone-acetaminophen (NORCO/VICODIN) 5-325 MG tablet  2. Pharmacy (Name, [place], City[id number] [place], [place]  3. On Med List: Yes   4. Last Visit with PCP: 3.2.2021  5. Next visit date with PCP: 6.2.21    Agent: Please be advised that RX refills may take up to 3 business days. We ask that you follow-up with your pharmacy.

## 2019-09-01 NOTE — Telephone Encounter (Signed)
[place] Controlled Database Checked Last filled:  08/03/19  #90

## 2019-09-02 MED ORDER — HYDROCODONE-ACETAMINOPHEN 5-325 MG PO TABS: 1.0000 | ORAL_TABLET | Freq: Three times a day (TID) | ORAL | 0 refills | Status: DC | PRN

## 2019-09-02 NOTE — Telephone Encounter (Signed)
Will renew the prescription.  The patient needs an office visit next month.  Thanks

## 2019-09-06 NOTE — Telephone Encounter (Signed)
Pt has appt already schedule for 09/07/19.Marland KitchenJohny Lynn

## 2019-09-07 ENCOUNTER — Ambulatory Visit: Payer: BC Managed Care – PPO | Admitting: Internal Medicine

## 2019-09-07 ENCOUNTER — Other Ambulatory Visit: Payer: Self-pay

## 2019-09-07 ENCOUNTER — Encounter: Payer: Self-pay | Admitting: Internal Medicine

## 2019-09-07 DIAGNOSIS — E559 Vitamin D deficiency, unspecified: Secondary | ICD-10-CM | POA: Diagnosis not present

## 2019-09-07 DIAGNOSIS — M8949 Other hypertrophic osteoarthropathy, multiple sites: Secondary | ICD-10-CM | POA: Diagnosis not present

## 2019-09-07 DIAGNOSIS — M159 Polyosteoarthritis, unspecified: Secondary | ICD-10-CM

## 2019-09-07 DIAGNOSIS — M5441 Lumbago with sciatica, right side: Secondary | ICD-10-CM | POA: Diagnosis not present

## 2019-09-07 DIAGNOSIS — I10 Essential (primary) hypertension: Secondary | ICD-10-CM | POA: Diagnosis not present

## 2019-09-07 MED ORDER — HYDROCODONE-ACETAMINOPHEN 5-325 MG PO TABS: 1.0000 | ORAL_TABLET | Freq: Three times a day (TID) | ORAL | 0 refills | Status: DC | PRN

## 2019-09-07 NOTE — Assessment & Plan Note (Signed)
Vit D 

## 2019-09-07 NOTE — Assessment & Plan Note (Signed)
Norco prn  Potential benefits of a long term opioids use as well as potential risks (i.e. addiction risk, apnea etc) and complications (i.e. Somnolence, constipation and others) were explained to the patient and were aknowledged. 

## 2019-09-07 NOTE — Progress Notes (Signed)
Subjective:  Patient ID: Jessica Lynn, female    DOB: September 20, 1960  Age: 59 y.o. MRN: [id number]  CC: No chief complaint on file.   HPI Jessica Lynn presents for OA/LBP, HTN, Vit D def f/u  Outpatient Medications Prior to Visit  Medication Sig Dispense Refill  . Adalimumab 40 MG/0.8ML PNKT Inject 40 mg into the skin.    . Cholecalciferol (VITAMIN D3) 2000 units capsule Take 1 capsule (2,000 Units total) by mouth daily. 100 capsule 3  . clonazePAM (KLONOPIN) 1 MG tablet Take 1/2 to 1  Tablet by mouth two times daily as needed for nerves 60 tablet 3  . dorzolamide-timolol (COSOPT) 22.3-6.8 MG/ML ophthalmic solution Place 1 drop into both eyes 2 (two) times daily. 10 mL 12  . folic acid (FOLVITE) 1 MG tablet Take 1 tablet (1 mg total) by mouth daily. 100 tablet 3  . HYDROcodone-acetaminophen (NORCO/VICODIN) 5-325 MG tablet Take 1 tablet by mouth 3 (three) times daily as needed for severe pain. 90 tablet 0  . hydrOXYzine (ATARAX/VISTARIL) 25 MG tablet TAKE 1 TABLET (25 MG TOTAL) BY MOUTH EVERY 8 (EIGHT) HOURS AS NEEDED FOR ITCHING. 90 tablet 3  . naproxen (NAPROSYN) 500 MG tablet TAKE 1 TABLET BY MOUTH TWICE DAILY WITH FOOD FOR MODERATE PAIN 60 tablet 3  . rosuvastatin (CRESTOR) 20 MG tablet TAKE 1 TABLET(20 MG) BY MOUTH DAILY 30 tablet 11  . valsartan-hydrochlorothiazide (DIOVAN-HCT) 160-12.5 MG tablet TAKE 1 TABLET BY MOUTH DAILY 30 tablet 11  . zolpidem (AMBIEN) 10 MG tablet TAKE 1 TABLET(10 MG) BY MOUTH AT BEDTIME AS NEEDED FOR SLEEP 30 tablet 3  . montelukast (SINGULAIR) 10 MG tablet Take 1 tablet (10 mg total) by mouth daily. 90 tablet 3   No facility-administered medications prior to visit.    ROS: Review of Systems  Constitutional: Negative for activity change, appetite change, chills, fatigue and unexpected weight change.  HENT: Negative for congestion, mouth sores and sinus pressure.   Eyes: Negative for visual disturbance.  Respiratory: Negative for cough and chest  tightness.   Gastrointestinal: Negative for abdominal pain and nausea.  Genitourinary: Negative for difficulty urinating, frequency and vaginal pain.  Musculoskeletal: Positive for arthralgias and back pain. Negative for gait problem.  Skin: Negative for pallor and rash.  Neurological: Negative for dizziness, tremors, weakness, numbness and headaches.  Psychiatric/Behavioral: Negative for confusion and sleep disturbance.    Objective:  BP (!) 142/80   Pulse 88   Temp 98.3 F (36.8 C) (Oral)   Ht 5' 5.5" (1.664 m)   Wt 258 lb (117 kg)   LMP 06/06/2015 Comment: irregular   SpO2 99%   BMI 42.28 kg/m   BP Readings from Last 3 Encounters:  09/07/19 (!) 142/80  06/07/19 124/80  03/09/19 126/82    Wt Readings from Last 3 Encounters:  09/07/19 258 lb (117 kg)  06/07/19 260 lb (117.9 kg)  03/09/19 258 lb (117 kg)    Physical Exam Constitutional:      General: She is not in acute distress.    Appearance: She is well-developed. She is obese.  HENT:     Head: Normocephalic.     Right Ear: External ear normal.     Left Ear: External ear normal.     Nose: Nose normal.  Eyes:     General:        Right eye: No discharge.        Left eye: No discharge.     Conjunctiva/sclera: Conjunctivae normal.  Pupils: Pupils are equal, round, and reactive to light.  Neck:     Thyroid: No thyromegaly.     Vascular: No JVD.     Trachea: No tracheal deviation.  Cardiovascular:     Rate and Rhythm: Normal rate and regular rhythm.     Heart sounds: Normal heart sounds.  Pulmonary:     Effort: No respiratory distress.     Breath sounds: No stridor. No wheezing.  Abdominal:     General: Bowel sounds are normal. There is no distension.     Palpations: Abdomen is soft. There is no mass.     Tenderness: There is no abdominal tenderness. There is no guarding or rebound.  Musculoskeletal:        General: Tenderness present.     Cervical back: Normal range of motion and neck supple.    Lymphadenopathy:     Cervical: No cervical adenopathy.  Skin:    Findings: No erythema or rash.  Neurological:     Mental Status: She is oriented to person, place, and time.     Cranial Nerves: No cranial nerve deficit.     Motor: No abnormal muscle tone.     Coordination: Coordination normal.     Deep Tendon Reflexes: Reflexes normal.  Psychiatric:        Behavior: Behavior normal.        Thought Content: Thought content normal.        Judgment: Judgment normal.   LS spine, knees - painful  Lab Results  Component Value Date   WBC 8.6 09/07/2018   HGB 13.1 09/07/2018   HCT 38.6 09/07/2018   PLT 322.0 09/07/2018   GLUCOSE 95 09/07/2018   CHOL 271 (H) 09/07/2018   TRIG 78.0 09/07/2018   HDL 72.20 09/07/2018   LDLDIRECT 160.7 02/06/2012   LDLCALC 183 (H) 09/07/2018   ALT 15 09/07/2018   AST 18 09/07/2018   NA 138 09/07/2018   K 3.7 09/07/2018   CL 103 09/07/2018   CREATININE 0.75 09/07/2018   BUN 19 09/07/2018   CO2 27 09/07/2018   TSH 0.74 09/07/2018    MM 3D SCREEN BREAST BILATERAL  Result Date: 11/12/2018 CLINICAL DATA:  Screening. EXAM: DIGITAL SCREENING BILATERAL MAMMOGRAM WITH TOMO AND CAD COMPARISON:  Previous exam(s). ACR Breast Density Category b: There are scattered areas of fibroglandular density. FINDINGS: There are no findings suspicious for malignancy. Images were processed with CAD. IMPRESSION: No mammographic evidence of malignancy. A result letter of this screening mammogram will be mailed directly to the patient. RECOMMENDATION: Screening mammogram in one year. (Code:SM-B-01Y) BI-RADS CATEGORY  1: Negative. Electronically Signed   By: Lillia Mountain M.D.   On: 11/12/2018 15:09    Assessment & Plan:   There are no diagnoses linked to this encounter.   No orders of the defined types were placed in this encounter.    Follow-up: No follow-ups on file.  Walker Kehr, MD

## 2019-09-07 NOTE — Assessment & Plan Note (Signed)
Valsartan HCT 

## 2019-10-03 ENCOUNTER — Other Ambulatory Visit: Payer: Self-pay | Admitting: Internal Medicine

## 2019-10-03 DIAGNOSIS — M159 Polyosteoarthritis, unspecified: Secondary | ICD-10-CM

## 2019-10-03 DIAGNOSIS — M8949 Other hypertrophic osteoarthropathy, multiple sites: Secondary | ICD-10-CM

## 2019-10-03 MED ORDER — HYDROCODONE-ACETAMINOPHEN 5-325 MG PO TABS: 1.0000 | ORAL_TABLET | Freq: Three times a day (TID) | ORAL | 0 refills | Status: DC | PRN

## 2019-10-03 MED ORDER — ZOLPIDEM TARTRATE 10 MG PO TABS: ORAL_TABLET | ORAL | 0 refills | Status: DC

## 2019-10-03 NOTE — Telephone Encounter (Signed)
Please advise in PCP's absence. Thanks!   [place] Controlled Database Checked Last filled: Hydrocodone/APAP   09/02/19 # 90 Zolpidem  09/11/19 # 30 LOV:  09/07/19 Next appt:  12/08/19

## 2019-10-03 NOTE — Telephone Encounter (Signed)
    1.Medication Requested: HYDROcodone-acetaminophen (NORCO/VICODIN) 5-325 MG tablet zolpidem (AMBIEN) 10 MG tablet  2. Pharmacy (Name, Street, [id number] - [place], [place] - [address] AT [place]  3. On Med List: yes  4. Last Visit with PCP: 09/2019  5. Next visit date with PCP: 12/08/19   Agent: Please be advised that RX refills may take up to 3 business days. We ask that you follow-up with your pharmacy.

## 2019-10-03 NOTE — Telephone Encounter (Signed)
Done erx 

## 2019-10-23 ENCOUNTER — Other Ambulatory Visit: Payer: Self-pay | Admitting: Internal Medicine

## 2019-11-03 ENCOUNTER — Other Ambulatory Visit: Payer: Self-pay | Admitting: Internal Medicine

## 2019-11-03 DIAGNOSIS — M8949 Other hypertrophic osteoarthropathy, multiple sites: Secondary | ICD-10-CM

## 2019-11-03 DIAGNOSIS — M159 Polyosteoarthritis, unspecified: Secondary | ICD-10-CM

## 2019-11-03 DIAGNOSIS — M150 Primary generalized (osteo)arthritis: Secondary | ICD-10-CM

## 2019-11-03 NOTE — Telephone Encounter (Signed)
HYDROcodone-acetaminophen (NORCO/VICODIN) 5-325 MG tablet  [id number] [address], [place] Phone:  [phone]  Fax:  [phone]      Last visit: 6.2.21 Next visit: 9.2.21

## 2019-11-03 NOTE — Telephone Encounter (Signed)
[place] Controlled Database Checked Last filled: 10/04/2019 (90) LOV w/you: 09/07/2019 Next appt w/you:12/08/2019

## 2019-11-04 MED ORDER — HYDROCODONE-ACETAMINOPHEN 5-325 MG PO TABS: 1.0000 | ORAL_TABLET | Freq: Three times a day (TID) | ORAL | 0 refills | Status: DC | PRN

## 2019-11-07 NOTE — Telephone Encounter (Signed)
See med refill.

## 2019-12-02 ENCOUNTER — Other Ambulatory Visit: Payer: Self-pay | Admitting: Internal Medicine

## 2019-12-02 DIAGNOSIS — M8949 Other hypertrophic osteoarthropathy, multiple sites: Secondary | ICD-10-CM

## 2019-12-02 DIAGNOSIS — M159 Polyosteoarthritis, unspecified: Secondary | ICD-10-CM

## 2019-12-02 NOTE — Telephone Encounter (Signed)
    1.Medication Requested:HYDROcodone-acetaminophen (NORCO/VICODIN) 5-325 MG tablet 2. Pharmacy (Name, [place], City[id number] [place], [place]  3. On Med List: yes  4. Last Visit with PCP: 09/07/19  5. Next visit date with PCP:12/16/19   Agent: Please be advised that RX refills may take up to 3 business days. We ask that you follow-up with your pharmacy.

## 2019-12-05 NOTE — Telephone Encounter (Signed)
New Message:   Pt is calling to check on the status of her refill request. Please advise.

## 2019-12-05 NOTE — Telephone Encounter (Signed)
[place] Controlled Database Checked Last filled: 11/04/2019 (90) LOV w/you: 09/07/2019 Next appt w/you: 12/16/2019

## 2019-12-06 MED ORDER — HYDROCODONE-ACETAMINOPHEN 5-325 MG PO TABS: 1.0000 | ORAL_TABLET | Freq: Three times a day (TID) | ORAL | 0 refills | Status: DC | PRN

## 2019-12-06 NOTE — Telephone Encounter (Signed)
Please fill in PCP's absence

## 2019-12-06 NOTE — Telephone Encounter (Signed)
Done erx 

## 2019-12-08 ENCOUNTER — Ambulatory Visit: Payer: BC Managed Care – PPO | Admitting: Internal Medicine

## 2019-12-11 ENCOUNTER — Other Ambulatory Visit: Payer: Self-pay | Admitting: Internal Medicine

## 2019-12-16 ENCOUNTER — Other Ambulatory Visit: Payer: Self-pay

## 2019-12-16 ENCOUNTER — Encounter: Payer: Self-pay | Admitting: Internal Medicine

## 2019-12-16 ENCOUNTER — Ambulatory Visit: Payer: BC Managed Care – PPO | Admitting: Internal Medicine

## 2019-12-16 VITALS — BP 140/80 | HR 88 | Temp 97.8°F | Ht 65.5 in | Wt 249.0 lb

## 2019-12-16 DIAGNOSIS — M2550 Pain in unspecified joint: Secondary | ICD-10-CM | POA: Diagnosis not present

## 2019-12-16 DIAGNOSIS — F419 Anxiety disorder, unspecified: Secondary | ICD-10-CM

## 2019-12-16 DIAGNOSIS — Z6841 Body Mass Index (BMI) 40.0 and over, adult: Secondary | ICD-10-CM

## 2019-12-16 DIAGNOSIS — Z0000 Encounter for general adult medical examination without abnormal findings: Secondary | ICD-10-CM

## 2019-12-16 DIAGNOSIS — R202 Paresthesia of skin: Secondary | ICD-10-CM | POA: Diagnosis not present

## 2019-12-16 MED ORDER — CLONAZEPAM 1 MG PO TABS
ORAL_TABLET | ORAL | 3 refills | Status: DC
Start: 2019-12-16 — End: 2021-11-05

## 2019-12-16 MED ORDER — ROSUVASTATIN CALCIUM 20 MG PO TABS
ORAL_TABLET | ORAL | 11 refills | Status: DC
Start: 2019-12-16 — End: 2020-03-19

## 2019-12-16 NOTE — Assessment & Plan Note (Signed)
Stop Crestor x 1 month Labs Norco prn

## 2019-12-16 NOTE — Assessment & Plan Note (Signed)
Better Wt Readings from Last 3 Encounters:  12/16/19 249 lb (112.9 kg)  09/07/19 258 lb (117 kg)  06/07/19 260 lb (117.9 kg)

## 2019-12-16 NOTE — Progress Notes (Signed)
Subjective:  Patient ID: Jessica Lynn, female    DOB: [birth year]  Age: 59 y.o. MRN: [id number]  CC: No chief complaint on file.   HPI RONDELL FRICK presents for OA, anxiety, stress F/u dyslipidemia C/o right foot pains, "pins and needles" - worse  Advised to get vaccinated for COVID ASAP  Outpatient Medications Prior to Visit  Medication Sig Dispense Refill  . Adalimumab 40 MG/0.8ML PNKT Inject 40 mg into the skin.    . Cholecalciferol (VITAMIN D3) 2000 units capsule Take 1 capsule (2,000 Units total) by mouth daily. 100 capsule 3  . clonazePAM (KLONOPIN) 1 MG tablet Take 1/2 to 1  Tablet by mouth two times daily as needed for nerves 60 tablet 3  . dorzolamide-timolol (COSOPT) 22.3-6.8 MG/ML ophthalmic solution Place 1 drop into both eyes 2 (two) times daily. 10 mL 12  . folic acid (FOLVITE) 1 MG tablet Take 1 tablet (1 mg total) by mouth daily. 100 tablet 3  . HYDROcodone-acetaminophen (NORCO/VICODIN) 5-325 MG tablet Take 1 tablet by mouth 3 (three) times daily as needed for severe pain. 90 tablet 0  . hydrOXYzine (ATARAX/VISTARIL) 25 MG tablet TAKE 1 TABLET (25 MG TOTAL) BY MOUTH EVERY 8 (EIGHT) HOURS AS NEEDED FOR ITCHING. 90 tablet 3  . naproxen (NAPROSYN) 500 MG tablet TAKE 1 TABLET BY MOUTH TWICE DAILY WITH FOOD FOR MODERATE PAIN 60 tablet 3  . rosuvastatin (CRESTOR) 20 MG tablet TAKE 1 TABLET(20 MG) BY MOUTH DAILY 30 tablet 11  . valsartan-hydrochlorothiazide (DIOVAN-HCT) 160-12.5 MG tablet TAKE 1 TABLET BY MOUTH DAILY 30 tablet 11  . zolpidem (AMBIEN) 10 MG tablet TAKE 1 TABLET(10 MG) BY MOUTH AT BEDTIME AS NEEDED FOR SLEEP 30 tablet 0  . montelukast (SINGULAIR) 10 MG tablet Take 1 tablet (10 mg total) by mouth daily. 90 tablet 3   No facility-administered medications prior to visit.    ROS: Review of Systems  Constitutional: Positive for fatigue. Negative for activity change, appetite change, chills and unexpected weight change.  HENT: Negative for congestion, mouth  sores and sinus pressure.   Eyes: Negative for visual disturbance.  Respiratory: Negative for cough and chest tightness.   Gastrointestinal: Negative for abdominal pain and nausea.  Genitourinary: Negative for difficulty urinating, frequency and vaginal pain.  Musculoskeletal: Positive for arthralgias, back pain and gait problem.  Skin: Negative for pallor and rash.  Neurological: Negative for dizziness, tremors, weakness, numbness and headaches.  Psychiatric/Behavioral: Positive for decreased concentration and sleep disturbance. Negative for confusion. The patient is nervous/anxious.     Objective:  BP 140/80 (BP Location: Left Arm, Patient Position: Sitting, Cuff Size: Large)   Pulse 88   Temp 97.8 F (36.6 C) (Oral)   Ht 5' 5.5" (1.664 m)   Wt 249 lb (112.9 kg)   LMP 06/06/2015 Comment: irregular   SpO2 99%   BMI 40.81 kg/m   BP Readings from Last 3 Encounters:  12/16/19 140/80  09/07/19 (!) 142/80  06/07/19 124/80    Wt Readings from Last 3 Encounters:  12/16/19 249 lb (112.9 kg)  09/07/19 258 lb (117 kg)  06/07/19 260 lb (117.9 kg)    Physical Exam Constitutional:      General: She is not in acute distress.    Appearance: She is well-developed. She is obese.  HENT:     Head: Normocephalic.     Right Ear: External ear normal.     Left Ear: External ear normal.     Nose: Nose normal.  Eyes:  General:        Right eye: No discharge.        Left eye: No discharge.     Conjunctiva/sclera: Conjunctivae normal.     Pupils: Pupils are equal, round, and reactive to light.  Neck:     Thyroid: No thyromegaly.     Vascular: No JVD.     Trachea: No tracheal deviation.  Cardiovascular:     Rate and Rhythm: Normal rate and regular rhythm.     Heart sounds: Normal heart sounds.  Pulmonary:     Effort: No respiratory distress.     Breath sounds: No stridor. No wheezing.  Abdominal:     General: Bowel sounds are normal. There is no distension.     Palpations:  Abdomen is soft. There is no mass.     Tenderness: There is no abdominal tenderness. There is no guarding or rebound.  Musculoskeletal:        General: Tenderness present.     Cervical back: Normal range of motion and neck supple.  Lymphadenopathy:     Cervical: No cervical adenopathy.  Skin:    Findings: No erythema or rash.  Neurological:     Mental Status: She is oriented to person, place, and time.     Cranial Nerves: No cranial nerve deficit.     Motor: No abnormal muscle tone.     Coordination: Coordination normal.     Gait: Gait abnormal.     Deep Tendon Reflexes: Reflexes normal.  Psychiatric:        Behavior: Behavior normal.        Thought Content: Thought content normal.        Judgment: Judgment normal.   painful R arch Pain in knees, LS and shoulders  Lab Results  Component Value Date   WBC 8.6 09/07/2018   HGB 13.1 09/07/2018   HCT 38.6 09/07/2018   PLT 322.0 09/07/2018   GLUCOSE 95 09/07/2018   CHOL 271 (H) 09/07/2018   TRIG 78.0 09/07/2018   HDL 72.20 09/07/2018   LDLDIRECT 160.7 02/06/2012   LDLCALC 183 (H) 09/07/2018   ALT 15 09/07/2018   AST 18 09/07/2018   NA 138 09/07/2018   K 3.7 09/07/2018   CL 103 09/07/2018   CREATININE 0.75 09/07/2018   BUN 19 09/07/2018   CO2 27 09/07/2018   TSH 0.74 09/07/2018    MM 3D SCREEN BREAST BILATERAL  Result Date: 11/12/2018 CLINICAL DATA:  Screening. EXAM: DIGITAL SCREENING BILATERAL MAMMOGRAM WITH TOMO AND CAD COMPARISON:  Previous exam(s). ACR Breast Density Category b: There are scattered areas of fibroglandular density. FINDINGS: There are no findings suspicious for malignancy. Images were processed with CAD. IMPRESSION: No mammographic evidence of malignancy. A result letter of this screening mammogram will be mailed directly to the patient. RECOMMENDATION: Screening mammogram in one year. (Code:SM-B-01Y) BI-RADS CATEGORY  1: Negative. Electronically Signed   By: Lillia Mountain M.D.   On: 11/12/2018 15:09     Assessment & Plan:    Walker Kehr, MD

## 2019-12-16 NOTE — Patient Instructions (Addendum)
Stop Crestor x 1 month  Wt Readings from Last 3 Encounters:  12/16/19 249 lb (112.9 kg)  09/07/19 258 lb (117 kg)  06/07/19 260 lb (117.9 kg)

## 2019-12-16 NOTE — Assessment & Plan Note (Signed)
Clonazepam prn 

## 2019-12-17 LAB — COMPLETE METABOLIC PANEL WITH GFR
AG Ratio: 1.4 (calc) (ref 1.0–2.5)
ALT: 21 U/L (ref 6–29)
AST: 29 U/L (ref 10–35)
Albumin: 4.3 g/dL (ref 3.6–5.1)
Alkaline phosphatase (APISO): 59 U/L (ref 37–153)
BUN: 14 mg/dL (ref 7–25)
CO2: 27 mmol/L (ref 20–32)
Calcium: 9.8 mg/dL (ref 8.6–10.4)
Chloride: 103 mmol/L (ref 98–110)
Creat: 0.81 mg/dL (ref 0.50–1.05)
GFR, Est African American: 93 mL/min/{1.73_m2} (ref >=60)
GFR, Est Non African American: 81 mL/min/{1.73_m2} (ref >=60)
Globulin: 3.1 g/dL (calc) (ref 1.9–3.7)
Glucose, Bld: 88 mg/dL (ref 65–99)
Potassium: 3.5 mmol/L (ref 3.5–5.3)
Sodium: 141 mmol/L (ref 135–146)
Total Bilirubin: 1.3 mg/dL — ABNORMAL HIGH (ref 0.2–1.2)
Total Protein: 7.4 g/dL (ref 6.1–8.1)

## 2019-12-17 LAB — CBC WITH DIFFERENTIAL/PLATELET
Absolute Monocytes: 612 cells/uL (ref 200–950)
Basophils Absolute: 43 cells/uL (ref 0–200)
Basophils Relative: 0.6 %
Eosinophils Absolute: 209 cells/uL (ref 15–500)
Eosinophils Relative: 2.9 %
HCT: 34.4 % — ABNORMAL LOW (ref 35.0–45.0)
Hemoglobin: 11.6 g/dL — ABNORMAL LOW (ref 11.7–15.5)
Lymphs Abs: 2974 cells/uL (ref 850–3900)
MCH: 30.4 pg (ref 27.0–33.0)
MCHC: 33.7 g/dL (ref 32.0–36.0)
MCV: 90.3 fL (ref 80.0–100.0)
MPV: 9.9 fL (ref 7.5–12.5)
Monocytes Relative: 8.5 %
Neutro Abs: 3362 cells/uL (ref 1500–7800)
Neutrophils Relative %: 46.7 %
Platelets: 262 10*3/uL (ref 140–400)
RBC: 3.81 10*6/uL (ref 3.80–5.10)
RDW: 13 % (ref 11.0–15.0)
Total Lymphocyte: 41.3 %
WBC: 7.2 10*3/uL (ref 3.8–10.8)

## 2019-12-17 LAB — HEMOGLOBIN A1C
Hgb A1c MFr Bld: 5.8 % of total Hgb — ABNORMAL HIGH (ref <5.7)
Mean Plasma Glucose: 120 (calc)
eAG (mmol/L): 6.6 (calc)

## 2019-12-17 LAB — LIPID PANEL
Cholesterol: 148 mg/dL (ref <200)
HDL: 70 mg/dL (ref >=50)
LDL Cholesterol (Calc): 63 mg/dL (calc)
Non-HDL Cholesterol (Calc): 78 mg/dL (calc) (ref <130)
Total CHOL/HDL Ratio: 2.1 (calc) (ref <5.0)
Triglycerides: 71 mg/dL (ref <150)

## 2019-12-17 LAB — URINALYSIS
Bilirubin Urine: NEGATIVE
Glucose, UA: NEGATIVE
Hgb urine dipstick: NEGATIVE
Ketones, ur: NEGATIVE
Nitrite: NEGATIVE
Protein, ur: NEGATIVE
Specific Gravity, Urine: 1.01 (ref 1.001–1.03)
pH: 5.5 (ref 5.0–8.0)

## 2019-12-17 LAB — TSH: TSH: 0.93 mIU/L (ref 0.40–4.50)

## 2019-12-17 LAB — VITAMIN B12: Vitamin B-12: 466 pg/mL (ref 200–1100)

## 2019-12-21 ENCOUNTER — Other Ambulatory Visit: Payer: Self-pay | Admitting: Internal Medicine

## 2019-12-21 DIAGNOSIS — Z1231 Encounter for screening mammogram for malignant neoplasm of breast: Secondary | ICD-10-CM

## 2020-01-03 ENCOUNTER — Other Ambulatory Visit: Payer: Self-pay | Admitting: Emergency Medicine

## 2020-01-03 DIAGNOSIS — M8949 Other hypertrophic osteoarthropathy, multiple sites: Secondary | ICD-10-CM

## 2020-01-03 DIAGNOSIS — M159 Polyosteoarthritis, unspecified: Secondary | ICD-10-CM

## 2020-01-03 NOTE — Telephone Encounter (Signed)
Pt is calling requesting a refill on her HYDROcodone-acetaminophen (NORCO/VICODIN) 5-325 MG tablet. [facility] [address] Thanks.

## 2020-01-03 NOTE — Telephone Encounter (Signed)
[place] Controlled Database Checked Last filled: 12/06/2019 (90) LOV w/you: 12/16/19 Next appt w/you: 03/19/20

## 2020-01-05 ENCOUNTER — Other Ambulatory Visit: Payer: Self-pay | Admitting: Internal Medicine

## 2020-01-05 ENCOUNTER — Ambulatory Visit
Admission: RE | Admit: 2020-01-05 | Discharge: 2020-01-05 | Disposition: A | Discharge status: Home / Self Care | Payer: BC Managed Care – PPO | Source: Ambulatory Visit | Attending: Internal Medicine | Admitting: Internal Medicine

## 2020-01-05 ENCOUNTER — Other Ambulatory Visit: Payer: Self-pay

## 2020-01-05 DIAGNOSIS — Z1231 Encounter for screening mammogram for malignant neoplasm of breast: Secondary | ICD-10-CM

## 2020-01-05 MED ORDER — HYDROCODONE-ACETAMINOPHEN 5-325 MG PO TABS: 1.0000 | ORAL_TABLET | Freq: Three times a day (TID) | ORAL | 0 refills | Status: DC | PRN

## 2020-01-09 ENCOUNTER — Other Ambulatory Visit: Payer: Self-pay | Admitting: Internal Medicine

## 2020-01-10 ENCOUNTER — Other Ambulatory Visit: Payer: Self-pay | Admitting: Internal Medicine

## 2020-02-02 ENCOUNTER — Telehealth: Payer: Self-pay | Admitting: Internal Medicine

## 2020-02-02 DIAGNOSIS — M159 Polyosteoarthritis, unspecified: Secondary | ICD-10-CM

## 2020-02-02 DIAGNOSIS — M8949 Other hypertrophic osteoarthropathy, multiple sites: Secondary | ICD-10-CM

## 2020-02-02 NOTE — Telephone Encounter (Signed)
  1.Medication Requested: HYDROcodone-acetaminophen (NORCO/VICODIN) 5-325 MG tablet  2. Pharmacy (Name, Street, [place]): [id number] [place], [place]        3. On Med List: yes  4. Last Visit with PCP: 12/16/19  5. Next visit date with PCP: 03/19/20   Agent: Please be advised that RX refills may take up to 3 business days. We ask that you follow-up with your pharmacy.

## 2020-02-03 MED ORDER — HYDROCODONE-ACETAMINOPHEN 5-325 MG PO TABS: 1.0000 | ORAL_TABLET | Freq: Three times a day (TID) | ORAL | 0 refills | Status: DC | PRN

## 2020-02-03 NOTE — Telephone Encounter (Signed)
Okay.  Thanks.

## 2020-02-16 ENCOUNTER — Encounter: Payer: Self-pay | Admitting: Family Medicine

## 2020-02-16 ENCOUNTER — Other Ambulatory Visit: Payer: Self-pay

## 2020-02-16 ENCOUNTER — Ambulatory Visit (INDEPENDENT_AMBULATORY_CARE_PROVIDER_SITE_OTHER): Payer: BC Managed Care – PPO | Admitting: Family Medicine

## 2020-02-16 ENCOUNTER — Other Ambulatory Visit (HOSPITAL_COMMUNITY)
Admission: RE | Admit: 2020-02-16 | Discharge: 2020-02-16 | Disposition: A | Discharge status: Home / Self Care | Payer: BC Managed Care – PPO | Source: Ambulatory Visit | Attending: Family Medicine | Admitting: Family Medicine

## 2020-02-16 VITALS — BP 137/78 | HR 79 | Ht 65.0 in | Wt 254.0 lb

## 2020-02-16 DIAGNOSIS — Z01419 Encounter for gynecological examination (general) (routine) without abnormal findings: Secondary | ICD-10-CM | POA: Diagnosis present

## 2020-02-16 DIAGNOSIS — N951 Menopausal and female climacteric states: Secondary | ICD-10-CM | POA: Diagnosis not present

## 2020-02-16 NOTE — Progress Notes (Signed)
GYNECOLOGY ANNUAL PREVENTATIVE CARE ENCOUNTER NOTE  Subjective:   Jessica Lynn is a 59 y.o. [id number] female here for a routine annual gynecologic exam.  Current complaints: Some vasomotor symptoms.   Denies abnormal vaginal bleeding, discharge, pelvic pain, problems with intercourse or other gynecologic concerns.    Gynecologic History Patient's last menstrual period was 06/06/2015. Patient is sexually active  Contraception: post menopausal status Last Pap: 2019. Results were: normal Last mammogram: 12/2019. Results were: normal  Obstetric History OB History  Gravida Para Term Preterm AB Living  2 2 2     2   SAB TAB Ectopic Multiple Live Births          2    # Outcome Date GA Lbr Len/2nd Weight Sex Delivery Anes PTL Lv  2 Term 1991 [redacted]w[redacted]d   M Vag-Spont   LIV  1 Term 63 [redacted]w[redacted]d   F Vag-Spont  N LIV    Past Medical History:  Diagnosis Date  . Allergic rhinitis    year around   . Allergy   . Atypical chest pain   . GERD (gastroesophageal reflux disease)   . Hypercholesterolemia   . Mild hypertension   . Obesity   . Postherpetic neuralgia    from shingles  . Shingles   . Snoring   . Somatic dysfunction     Past Surgical History:  Procedure Laterality Date  . DILATION AND CURETTAGE OF UTERUS     x2 w/ miscarriages  . TUBAL LIGATION    . VAGINAL DELIVERY     x2    Current Outpatient Medications on File Prior to Visit  Medication Sig Dispense Refill  . Adalimumab 40 MG/0.8ML PNKT Inject 40 mg into the skin.    . Cholecalciferol (VITAMIN D3) 2000 units capsule Take 1 capsule (2,000 Units total) by mouth daily. 100 capsule 3  . clonazePAM (KLONOPIN) 1 MG tablet Take 1/2 to 1  Tablet by mouth two times daily as needed for nerves 60 tablet 3  . dorzolamide-timolol (COSOPT) 22.3-6.8 MG/ML ophthalmic solution Place 1 drop into both eyes 2 (two) times daily. 10 mL 12  . folic acid (FOLVITE) 1 MG tablet Take 1 tablet (1 mg total) by mouth daily. 100 tablet 3  .  hydrOXYzine (ATARAX/VISTARIL) 25 MG tablet TAKE 1 TABLET (25 MG TOTAL) BY MOUTH EVERY 8 (EIGHT) HOURS AS NEEDED FOR ITCHING. 90 tablet 3  . naproxen (NAPROSYN) 500 MG tablet TAKE 1 TABLET BY MOUTH TWICE DAILY WITH FOOD FOR MODERATE PAIN 60 tablet 3  . valsartan-hydrochlorothiazide (DIOVAN-HCT) 160-12.5 MG tablet TAKE 1 TABLET BY MOUTH DAILY 30 tablet 11  . zolpidem (AMBIEN) 10 MG tablet TAKE 1 TABLET(10 MG) BY MOUTH AT BEDTIME AS NEEDED FOR SLEEP 30 tablet 1  . HYDROcodone-acetaminophen (NORCO/VICODIN) 5-325 MG tablet Take 1 tablet by mouth 3 (three) times daily as needed for severe pain. (Patient not taking: Reported on 02/16/2020) 90 tablet 0  . montelukast (SINGULAIR) 10 MG tablet Take 1 tablet (10 mg total) by mouth daily. 90 tablet 3  . rosuvastatin (CRESTOR) 20 MG tablet TAKE 1 TABLET(20 MG) BY MOUTH DAILY (Patient not taking: Reported on 02/16/2020) 30 tablet 11   No current facility-administered medications on file prior to visit.    Allergies  Allergen Reactions  . Oxycodone Itching    Social History   Socioeconomic History  . Marital status: Married    Spouse name: [place] Bradby  . Number of children: Not on file  . Years of education:  Not on file  . Highest education level: Not on file  Occupational History  . Occupation: Financial planner: [employer]  Tobacco Use  . Smoking status: Former Smoker    Packs/day: 0.20    Years: 4.00    Pack years: 0.80    Types: Cigarettes    Quit date: 04/07/2006    Years since quitting: 13.8  . Smokeless tobacco: Never Used  Vaping Use  . Vaping Use: Never used  Substance and Sexual Activity  . Alcohol use: No    Alcohol/week: 0.0 standard drinks    Comment: social use  . Drug use: No  . Sexual activity: Yes    Birth control/protection: None  Other Topics Concern  . Not on file  Social History Narrative  . Not on file   Social Determinants of Health   Financial Resource Strain:   . Difficulty of Paying  Living Expenses: Not on file  Food Insecurity:   . Worried About Charity fundraiser in the Last Year: Not on file  . Ran Out of Food in the Last Year: Not on file  Transportation Needs:   . Lack of Transportation (Medical): Not on file  . Lack of Transportation (Non-Medical): Not on file  Physical Activity:   . Days of Exercise per Week: Not on file  . Minutes of Exercise per Session: Not on file  Stress:   . Feeling of Stress : Not on file  Social Connections:   . Frequency of Communication with Friends and Family: Not on file  . Frequency of Social Gatherings with Friends and Family: Not on file  . Attends Religious Services: Not on file  . Active Member of Clubs or Organizations: Not on file  . Attends Archivist Meetings: Not on file  . Marital Status: Not on file  Intimate Partner Violence:   . Fear of Current or Ex-Partner: Not on file  . Emotionally Abused: Not on file  . Physically Abused: Not on file  . Sexually Abused: Not on file    Family History  Problem Relation Age of Onset  . Colon cancer Brother 28  . Colon polyps Mother   . Colon polyps Father   . Colon cancer Maternal Uncle   . Esophageal cancer Neg Hx   . Rectal cancer Neg Hx   . Stomach cancer Neg Hx     The following portions of the patient's history were reviewed and updated as appropriate: allergies, current medications, past family history, past medical history, past social history, past surgical history and problem list.  Review of Systems Pertinent items are noted in HPI.   Objective:  BP 137/78   Pulse 79   Ht 5\' 5"  (1.651 m)   Wt 254 lb (115.2 kg)   LMP 06/06/2015 Comment: irregular   BMI 42.27 kg/m  Wt Readings from Last 3 Encounters:  02/16/20 254 lb (115.2 kg)  12/16/19 249 lb (112.9 kg)  09/07/19 258 lb (117 kg)     Chaperone present during exam  CONSTITUTIONAL: Well-developed, well-nourished female in no acute distress.  HENT:  Normocephalic, atraumatic, External  right and left ear normal. Oropharynx is clear and moist EYES: Conjunctivae and EOM are normal. Pupils are equal, round, and reactive to light. No scleral icterus.  NECK: Normal range of motion, supple, no masses.  Normal thyroid.   CARDIOVASCULAR: Normal heart rate noted, regular rhythm RESPIRATORY: Clear to auscultation bilaterally. Effort and breath sounds normal, no problems  with respiration noted. BREASTS: Symmetric in size. No masses, skin changes, nipple drainage, or lymphadenopathy. ABDOMEN: Soft, normal bowel sounds, no distention noted.  No tenderness, rebound or guarding.  PELVIC: Normal appearing external genitalia; normal appearing vaginal mucosa and cervix.  No abnormal discharge noted.  Normal uterine size, no other palpable masses, no uterine or adnexal tenderness. MUSCULOSKELETAL: Normal range of motion. No tenderness.  No cyanosis, clubbing, or edema.  2+ distal pulses. SKIN: Skin is warm and dry. No rash noted. Not diaphoretic. No erythema. No pallor. NEUROLOGIC: Alert and oriented to person, place, and time. Normal reflexes, muscle tone coordination. No cranial nerve deficit noted. PSYCHIATRIC: Normal mood and affect. Normal behavior. Normal judgment and thought content.  Assessment:  Annual gynecologic examination with pap smear   Plan:  1. Well Woman Exam Will follow up results of pap smear and manage accordingly. Mammogram reviewed. - Cytology - PAP( [facility])  2. Vasomotor symptoms Discussed options - recommended Evening Primrose oil.   Routine preventative health maintenance measures emphasized. Please refer to After Visit Summary for other counseling recommendations.    Loma Boston, [place] for [employer]

## 2020-02-16 NOTE — Patient Instructions (Signed)
For hot flashes and night sweats you can use Evening Primrose Oil 500mg  daily.

## 2020-02-16 NOTE — Progress Notes (Signed)
Patient up to date on mammogram- 09-/30/2021

## 2020-02-17 LAB — CYTOLOGY - PAP
Comment: NEGATIVE
Diagnosis: NEGATIVE
High risk HPV: NEGATIVE

## 2020-03-05 ENCOUNTER — Telehealth: Payer: Self-pay | Admitting: Internal Medicine

## 2020-03-05 NOTE — Telephone Encounter (Signed)
Check [place] registry last filled 02/03/2020.Marland KitchenJohny Chess

## 2020-03-05 NOTE — Telephone Encounter (Signed)
     1.Medication Requested:HYDROcodone-acetaminophen (NORCO/VICODIN) 5-325 MG tablet  2. Pharmacy (Name, [place], City):[id number] [place] [place]  3. On Med List: yes  4. Last Visit with PCP: 12/16/19  5. Next visit date with PCP: 03/19/20   Agent: Please be advised that RX refills may take up to 3 business days. We ask that you follow-up with your pharmacy.

## 2020-03-06 ENCOUNTER — Other Ambulatory Visit: Payer: Self-pay | Admitting: Internal Medicine

## 2020-03-06 DIAGNOSIS — M8949 Other hypertrophic osteoarthropathy, multiple sites: Secondary | ICD-10-CM

## 2020-03-06 DIAGNOSIS — M159 Polyosteoarthritis, unspecified: Secondary | ICD-10-CM

## 2020-03-06 MED ORDER — HYDROCODONE-ACETAMINOPHEN 5-325 MG PO TABS: 1.0000 | ORAL_TABLET | Freq: Three times a day (TID) | ORAL | 0 refills | Status: DC | PRN

## 2020-03-06 NOTE — Telephone Encounter (Signed)
Done.  She needs to schedule an office visit.  Thanks

## 2020-03-06 NOTE — Progress Notes (Signed)
Okay.  Done.  She needs to schedule an office visit every 3 months.  Thanks

## 2020-03-07 NOTE — Telephone Encounter (Signed)
Pt has appt 03/21/20.Marland KitchenJohny Lynn

## 2020-03-12 ENCOUNTER — Other Ambulatory Visit: Payer: Self-pay | Admitting: Internal Medicine

## 2020-03-12 NOTE — Telephone Encounter (Signed)
Check [place] registry last filled 02/11/2020../lmb  

## 2020-03-16 ENCOUNTER — Other Ambulatory Visit: Payer: Self-pay

## 2020-03-19 ENCOUNTER — Other Ambulatory Visit: Payer: Self-pay

## 2020-03-19 ENCOUNTER — Ambulatory Visit: Payer: BC Managed Care – PPO | Admitting: Internal Medicine

## 2020-03-19 ENCOUNTER — Encounter: Payer: Self-pay | Admitting: Internal Medicine

## 2020-03-19 DIAGNOSIS — J309 Allergic rhinitis, unspecified: Secondary | ICD-10-CM

## 2020-03-19 DIAGNOSIS — E7800 Pure hypercholesterolemia, unspecified: Secondary | ICD-10-CM

## 2020-03-19 DIAGNOSIS — M159 Polyosteoarthritis, unspecified: Secondary | ICD-10-CM

## 2020-03-19 DIAGNOSIS — Z6841 Body Mass Index (BMI) 40.0 and over, adult: Secondary | ICD-10-CM

## 2020-03-19 DIAGNOSIS — M8949 Other hypertrophic osteoarthropathy, multiple sites: Secondary | ICD-10-CM

## 2020-03-19 DIAGNOSIS — I10 Essential (primary) hypertension: Secondary | ICD-10-CM

## 2020-03-19 MED ORDER — PRAVASTATIN SODIUM 20 MG PO TABS: 20.0000 mg | ORAL_TABLET | Freq: Every day | ORAL | 3 refills | Status: DC

## 2020-03-19 MED ORDER — MONTELUKAST SODIUM 10 MG PO TABS: 10.0000 mg | ORAL_TABLET | Freq: Every day | ORAL | 3 refills | Status: DC

## 2020-03-19 MED ORDER — HYDROCODONE-ACETAMINOPHEN 5-325 MG PO TABS: 1.0000 | ORAL_TABLET | Freq: Three times a day (TID) | ORAL | 0 refills | Status: DC | PRN

## 2020-03-19 NOTE — Assessment & Plan Note (Signed)
Cramps are better Switch to Pravachol

## 2020-03-19 NOTE — Assessment & Plan Note (Signed)
Comnt w/Valsartan HCT

## 2020-03-19 NOTE — Assessment & Plan Note (Signed)
Pt lost wt 

## 2020-03-19 NOTE — Progress Notes (Signed)
Subjective:  Patient ID: Jessica Lynn, female    DOB: 1960/06/01  Age: 59 y.o. 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 Adalimumab 40 MG/0.8ML PNKT Inject 40 mg into the skin.    . Cholecalciferol (VITAMIN D3) 2000 units capsule Take 1 capsule (2,000 Units total) by mouth daily. 100 capsule 3  . clonazePAM (KLONOPIN) 1 MG tablet Take 1/2 to 1  Tablet by mouth two times daily as needed for nerves 60 tablet 3  . dorzolamide-timolol (COSOPT) 22.3-6.8 MG/ML ophthalmic solution Place 1 drop into both eyes 2 (two) times daily. 10 mL 12  . folic acid (FOLVITE) 1 MG tablet Take 1 tablet (1 mg total) by mouth daily. 100 tablet 3  . HYDROcodone-acetaminophen (NORCO/VICODIN) 5-325 MG tablet Take 1 tablet by mouth 3 (three) times daily as needed for severe pain. 90 tablet 0  . hydrOXYzine (ATARAX/VISTARIL) 25 MG tablet TAKE 1 TABLET (25 MG TOTAL) BY MOUTH EVERY 8 (EIGHT) HOURS AS NEEDED FOR ITCHING. 90 tablet 3  . naproxen (NAPROSYN) 500 MG tablet TAKE 1 TABLET BY MOUTH TWICE DAILY WITH FOOD FOR MODERATE PAIN 60 tablet 3  . valsartan-hydrochlorothiazide (DIOVAN-HCT) 160-12.5 MG tablet TAKE 1 TABLET BY MOUTH DAILY 30 tablet 11  . zolpidem (AMBIEN) 10 MG tablet TAKE 1 TABLET(10 MG) BY MOUTH AT BEDTIME AS NEEDED FOR SLEEP 30 tablet 3  . montelukast (SINGULAIR) 10 MG tablet Take 1 tablet (10 mg total) by mouth daily. 90 tablet 3  . rosuvastatin (CRESTOR) 20 MG tablet TAKE 1 TABLET(20 MG) BY MOUTH DAILY (Patient not taking: No sig reported) 30 tablet 11   No facility-administered medications prior to visit.    ROS: Review of Systems  Constitutional: Negative for activity change, appetite change, chills, fatigue and unexpected weight change.  HENT: Negative for congestion, mouth sores and sinus pressure.   Eyes: Negative for visual disturbance.   Respiratory: Negative for cough and chest tightness.   Gastrointestinal: Negative for abdominal pain and nausea.  Genitourinary: Negative for difficulty urinating, frequency and vaginal pain.  Musculoskeletal: Positive for arthralgias, back pain and gait problem.  Skin: Negative for pallor and rash.  Neurological: Negative for dizziness, tremors, weakness, numbness and headaches.  Psychiatric/Behavioral: Negative for confusion and sleep disturbance.    Objective:  BP 138/82 (BP Location: Left Arm)   Pulse 90   Temp 98.2 F (36.8 C) (Oral)   Wt 251 lb 6.4 oz (114 kg)   LMP 06/06/2015 Comment: irregular   SpO2 99%   BMI 41.84 kg/m   BP Readings from Last 3 Encounters:  03/19/20 138/82  02/16/20 137/78  12/16/19 140/80    Wt Readings from Last 3 Encounters:  03/19/20 251 lb 6.4 oz (114 kg)  02/16/20 254 lb (115.2 kg)  12/16/19 249 lb (112.9 kg)    Physical Exam Constitutional:      General: She is not in acute distress.    Appearance: She is well-developed.  HENT:     Head: Normocephalic.     Right Ear: External ear normal.     Left Ear: External ear normal.     Nose: Nose normal.     Mouth/Throat:     Mouth: Oropharynx is clear and moist.  Eyes:     General:        Right eye: No discharge.  Left eye: No discharge.     Conjunctiva/sclera: Conjunctivae normal.     Pupils: Pupils are equal, round, and reactive to light.  Neck:     Thyroid: No thyromegaly.     Vascular: No JVD.     Trachea: No tracheal deviation.  Cardiovascular:     Rate and Rhythm: Normal rate and regular rhythm.     Heart sounds: Normal heart sounds.  Pulmonary:     Effort: No respiratory distress.     Breath sounds: No stridor. No wheezing.  Abdominal:     General: Bowel sounds are normal. There is no distension.     Palpations: Abdomen is soft. There is no mass.     Tenderness: There is no abdominal tenderness. There is no guarding or rebound.  Musculoskeletal:        General:  Tenderness present. No edema.     Cervical back: Normal range of motion and neck supple.  Lymphadenopathy:     Cervical: No cervical adenopathy.  Skin:    Findings: No erythema or rash.  Neurological:     Cranial Nerves: No cranial nerve deficit.     Motor: No abnormal muscle tone.     Coordination: Coordination normal.     Gait: Gait abnormal.     Deep Tendon Reflexes: Reflexes normal.  Psychiatric:        Mood and Affect: Mood and affect normal.        Behavior: Behavior normal.        Thought Content: Thought content normal.        Judgment: Judgment normal.   LS - tender w/ROM  Lab Results  Component Value Date   WBC 7.2 12/16/2019   HGB 11.6 (L) 12/16/2019   HCT 34.4 (L) 12/16/2019   PLT 262 12/16/2019   GLUCOSE 88 12/16/2019   CHOL 148 12/16/2019   TRIG 71 12/16/2019   HDL 70 12/16/2019   LDLDIRECT 160.7 02/06/2012   LDLCALC 63 12/16/2019   ALT 21 12/16/2019   AST 29 12/16/2019   NA 141 12/16/2019   K 3.5 12/16/2019   CL 103 12/16/2019   CREATININE 0.81 12/16/2019   BUN 14 12/16/2019   CO2 27 12/16/2019   TSH 0.93 12/16/2019   HGBA1C 5.8 (H) 12/16/2019    No results found.  Assessment & Plan:    Walker Kehr, MD

## 2020-03-19 NOTE — Assessment & Plan Note (Signed)
Singulair renewed 

## 2020-04-03 ENCOUNTER — Telehealth: Payer: Self-pay | Admitting: Internal Medicine

## 2020-04-03 DIAGNOSIS — M150 Primary generalized (osteo)arthritis: Secondary | ICD-10-CM

## 2020-04-03 DIAGNOSIS — M8949 Other hypertrophic osteoarthropathy, multiple sites: Secondary | ICD-10-CM

## 2020-04-03 DIAGNOSIS — M159 Polyosteoarthritis, unspecified: Secondary | ICD-10-CM

## 2020-04-03 NOTE — Telephone Encounter (Signed)
1.Medication Requested: HYDROcodone-acetaminophen (NORCO/VICODIN) 5-325 MG tablet    2. Pharmacy (Name, Street, [place]): [phone] [place] [place] - [address]  3. On Med List: yes   4. Last Visit with PCP: 12.13.21  5. Next visit date with PCP: 3.21.22   Agent: Please be advised that RX refills may take up to 3 business days. We ask that you follow-up with your pharmacy.

## 2020-04-04 NOTE — Telephone Encounter (Signed)
Sent to Dr. Alain Marion.

## 2020-04-05 MED ORDER — HYDROCODONE-ACETAMINOPHEN 5-325 MG PO TABS: 1.0000 | ORAL_TABLET | Freq: Three times a day (TID) | ORAL | 0 refills | Status: DC | PRN

## 2020-04-05 NOTE — Telephone Encounter (Signed)
OK. Thx

## 2020-04-08 ENCOUNTER — Other Ambulatory Visit: Payer: Self-pay | Admitting: Internal Medicine

## 2020-04-25 ENCOUNTER — Other Ambulatory Visit: Payer: Self-pay | Admitting: Internal Medicine

## 2020-05-03 ENCOUNTER — Telehealth: Payer: Self-pay | Admitting: Internal Medicine

## 2020-05-03 DIAGNOSIS — M159 Polyosteoarthritis, unspecified: Secondary | ICD-10-CM

## 2020-05-03 DIAGNOSIS — M8949 Other hypertrophic osteoarthropathy, multiple sites: Secondary | ICD-10-CM

## 2020-05-03 NOTE — Telephone Encounter (Signed)
Patient requesting refill for HYDROcodone-acetaminophen (NORCO/VICODIN) 5-325 MG tablet Pharmacy [id number] [place], [place]

## 2020-05-03 NOTE — Telephone Encounter (Signed)
Check [place] registry last filled 04/05/2020. NOV 06/25/20.Marland KitchenJohny Lynn

## 2020-05-04 ENCOUNTER — Other Ambulatory Visit: Payer: Self-pay | Admitting: Internal Medicine

## 2020-05-06 MED ORDER — HYDROCODONE-ACETAMINOPHEN 5-325 MG PO TABS: 1.0000 | ORAL_TABLET | Freq: Three times a day (TID) | ORAL | 0 refills | Status: DC | PRN

## 2020-05-06 NOTE — Telephone Encounter (Signed)
Done. Thx.

## 2020-05-22 ENCOUNTER — Ambulatory Visit (INDEPENDENT_AMBULATORY_CARE_PROVIDER_SITE_OTHER): Payer: Self-pay

## 2020-05-22 ENCOUNTER — Other Ambulatory Visit: Payer: Self-pay

## 2020-05-22 ENCOUNTER — Ambulatory Visit (HOSPITAL_COMMUNITY)
Admission: EM | Admit: 2020-05-22 | Discharge: 2020-05-22 | Disposition: A | Discharge status: Home / Self Care | Payer: Self-pay | Attending: Urgent Care | Admitting: Urgent Care

## 2020-05-22 ENCOUNTER — Encounter (HOSPITAL_COMMUNITY): Payer: Self-pay | Admitting: Emergency Medicine

## 2020-05-22 DIAGNOSIS — M25561 Pain in right knee: Secondary | ICD-10-CM

## 2020-05-22 DIAGNOSIS — M545 Low back pain, unspecified: Secondary | ICD-10-CM

## 2020-05-22 MED ORDER — TIZANIDINE HCL 4 MG PO TABS: 4.0000 mg | ORAL_TABLET | Freq: Every day | ORAL | 0 refills | Status: DC

## 2020-05-22 MED ORDER — NAPROXEN 375 MG PO TABS: 375.0000 mg | ORAL_TABLET | Freq: Two times a day (BID) | ORAL | 0 refills | Status: DC
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
## 2020-05-28 ENCOUNTER — Other Ambulatory Visit: Payer: Self-pay | Admitting: Internal Medicine

## 2020-06-04 ENCOUNTER — Telehealth: Payer: Self-pay | Admitting: Internal Medicine

## 2020-06-04 NOTE — Telephone Encounter (Signed)
  HYDROcodone-acetaminophen (NORCO/VICODIN) 5-325 MG tablet [id number] [place], [place] Phone:  [phone]  Fax:  [phone]     Last seen-12.13.21 Next apt- 03.21.22 Requesting a refill

## 2020-06-05 ENCOUNTER — Other Ambulatory Visit: Payer: Self-pay | Admitting: Internal Medicine

## 2020-06-05 DIAGNOSIS — M8949 Other hypertrophic osteoarthropathy, multiple sites: Secondary | ICD-10-CM

## 2020-06-05 DIAGNOSIS — M159 Polyosteoarthritis, unspecified: Secondary | ICD-10-CM

## 2020-06-05 MED ORDER — HYDROCODONE-ACETAMINOPHEN 5-325 MG PO TABS: 1.0000 | ORAL_TABLET | Freq: Three times a day (TID) | ORAL | 0 refills | Status: DC | PRN

## 2020-06-05 NOTE — Progress Notes (Signed)
Okay.  Done.  Office visit every 3 months.  Thank you

## 2020-06-05 NOTE — Telephone Encounter (Signed)
It was done.  Thanks °

## 2020-06-25 ENCOUNTER — Encounter: Payer: Self-pay | Admitting: Internal Medicine

## 2020-06-25 ENCOUNTER — Ambulatory Visit: Payer: BC Managed Care – PPO | Admitting: Internal Medicine

## 2020-06-25 ENCOUNTER — Other Ambulatory Visit: Payer: Self-pay

## 2020-06-25 DIAGNOSIS — E785 Hyperlipidemia, unspecified: Secondary | ICD-10-CM

## 2020-06-25 DIAGNOSIS — M545 Low back pain, unspecified: Secondary | ICD-10-CM | POA: Diagnosis not present

## 2020-06-25 DIAGNOSIS — Z6841 Body Mass Index (BMI) 40.0 and over, adult: Secondary | ICD-10-CM

## 2020-06-25 DIAGNOSIS — G4700 Insomnia, unspecified: Secondary | ICD-10-CM

## 2020-06-25 DIAGNOSIS — M159 Polyosteoarthritis, unspecified: Secondary | ICD-10-CM

## 2020-06-25 DIAGNOSIS — M8949 Other hypertrophic osteoarthropathy, multiple sites: Secondary | ICD-10-CM | POA: Diagnosis not present

## 2020-06-25 MED ORDER — ZOLPIDEM TARTRATE 10 MG PO TABS: ORAL_TABLET | ORAL | 3 refills | Status: DC

## 2020-06-25 MED ORDER — HYDROCODONE-ACETAMINOPHEN 5-325 MG PO TABS
1.0000 | ORAL_TABLET | Freq: Three times a day (TID) | ORAL | 0 refills | Status: DC | PRN
Start: 2020-06-25 — End: 2020-08-03

## 2020-06-25 NOTE — Patient Instructions (Signed)
Plantar Fasciitis Rehab Ask your health care provider which exercises are safe for you. Do exercises exactly as told by your health care provider and adjust them as directed. It is normal to feel mild stretching, pulling, tightness, or discomfort as you do these exercises. Stop right away if you feel sudden pain or your pain gets worse. Do not begin these exercises until told by your health care provider. Stretching and range-of-motion exercises These exercises warm up your muscles and joints and improve the movement and flexibility of your foot. These exercises also help to relieve pain. Plantar fascia stretch 1. Sit with your left / right leg crossed over your opposite knee. 2. Hold your heel with one hand with that thumb near your arch. With your other hand, hold your toes and gently pull them back toward the top of your foot. You should feel a stretch on the base (bottom) of your toes, or the bottom of your foot (plantar fascia), or both. 3. Hold this stretch for__________ seconds. 4. Slowly release your toes and return to the starting position. Repeat __________ times. Complete this exercise __________ times a day.   Gastrocnemius stretch, standing This exercise is also called a calf (gastroc) stretch. It stretches the muscles in the back of the upper calf. 1. Stand with your hands against a wall. 2. Extend your left / right leg behind you, and bend your front knee slightly. 3. Keeping your heels on the floor, your toes facing forward, and your back knee straight, shift your weight toward the wall. Do not arch your back. You should feel a gentle stretch in your upper calf. 4. Hold this position for __________ seconds. Repeat __________ times. Complete this exercise __________ times a day.   Soleus stretch, standing This exercise is also called a calf (soleus) stretch. It stretches the muscles in the back of the lower calf. 1. Stand with your hands against a wall. 2. Extend your left / right  leg behind you, and bend your front knee slightly. 3. Keeping your heels on the floor and your toes facing forward, bend your back knee and shift your weight slightly over your back leg. You should feel a gentle stretch deep in your lower calf. 4. Hold this position for __________ seconds. Repeat __________ times. Complete this exercise __________ times a day. Gastroc and soleus stretch, standing step This exercise stretches the muscles in the back of the lower leg. These muscles are in the upper calf (gastrocnemius) and the lower calf (soleus). 1. Stand with the ball of your left / right foot on the front of a step. The ball of your foot is on the walking surface, right under your toes. 2. Keep your other foot firmly on the same step. 3. Hold on to the wall or a railing for balance. 4. Slowly lift your other foot, allowing your body weight to press your heel down over the edge of the front of the step. Keep knee straight and unbent. You should feel a stretch in your calf. 5. Hold this position for __________ seconds. 6. Return both feet to the step. 7. Repeat this exercise with a slight bend in your left / right knee. Repeat __________ times with your left / right knee straight and __________ times with your left / right knee bent. Complete this exercise __________ times a day. Balance exercise This exercise builds your balance and strength control of your arch to help take pressure off your plantar fascia. Single leg stand If this exercise   is too easy, you can try it with your eyes closed or while standing on a pillow. 1. Without shoes, stand near a railing or in a doorway. You may hold on to the railing or door frame as needed. 2. Stand on your left / right foot. Keep your big toe down on the floor and lift the arch of your foot. You should feel a stretch across the bottom of your foot and your arch. Do not let your foot roll inward. 3. Hold this position for __________ seconds. Repeat  __________ times. Complete this exercise __________ times a day. This information is not intended to replace advice given to you by your health care provider. Make sure you discuss any questions you have with your health care provider. Document Revised: 01/05/2020 Document Reviewed: 01/05/2020 Elsevier Patient Education  2021 [employer]. Plantar Fasciitis  Plantar fasciitis is a painful foot condition that affects the heel. It occurs when the band of tissue that connects the toes to the heel bone (plantar fascia) becomes irritated. This can happen as the result of exercising too much or doing other repetitive activities (overuse injury). Plantar fasciitis can cause mild irritation to severe pain that makes it difficult to walk or move. The pain is usually worse in the morning after sleeping, or after sitting or lying down for a period of time. Pain may also be worse after long periods of walking or standing. What are the causes? This condition may be caused by:  Standing for long periods of time.  Wearing shoes that do not have good arch support.  Doing activities that put stress on joints (high-impact activities). This includes ballet and exercise that makes your heart beat faster (aerobic exercise), such as running.  Being overweight.  An abnormal way of walking (gait).  Tight muscles in the back of your lower leg (calf).  High arches in your feet or flat feet.  Starting a new athletic activity. What are the signs or symptoms? The main symptom of this condition is heel pain. Pain may get worse after the following:  Taking the first steps after a time of rest, especially in the morning after awakening, or after you have been sitting or lying down for a while.  Long periods of standing still. Pain may decrease after 30-45 minutes of activity, such as gentle walking. How is this diagnosed? This condition may be diagnosed based on your medical history, a physical exam, and your  symptoms. Your health care provider will check for:  A tender area on the bottom of your foot.  A high arch in your foot or flat feet.  Pain when you move your foot.  Difficulty moving your foot. You may have imaging tests to confirm the diagnosis, such as:  X-rays.  Ultrasound.  MRI. How is this treated? Treatment for plantar fasciitis depends on how severe your condition is. Treatment may include:  Rest, ice, pressure (compression), and raising (elevating) the affected foot. This is called RICE therapy. Your health care provider may recommend RICE therapy along with over-the-counter pain medicines to manage your pain.  Exercises to stretch your calves and your plantar fascia.  A splint that holds your foot in a stretched, upward position while you sleep (night splint).  Physical therapy to relieve symptoms and prevent problems in the future.  Injections of steroid medicine (cortisone) to relieve pain and inflammation.  Stimulating your plantar fascia with electrical impulses (extracorporeal shock wave therapy). This is usually the last treatment option before surgery.  Surgery, if other treatments have not worked after 12 months. Follow these instructions at home: Managing pain, stiffness, and swelling  If directed, put ice on the painful area. To do this: ? Put ice in a plastic bag, or use a frozen bottle of water. ? Place a towel between your skin and the bag or bottle. ? Roll the bottom of your foot over the bag or bottle. ? Do this for 20 minutes, 2-3 times a day.  Wear athletic shoes that have air-sole or gel-sole cushions, or try soft shoe inserts that are designed for plantar fasciitis.  Elevate your foot above the level of your heart while you are sitting or lying down.   Activity  Avoid activities that cause pain. Ask your health care provider what activities are safe for you.  Do physical therapy exercises and stretches as told by your health care  provider.  Try activities and forms of exercise that are easier on your joints (low impact). Examples include swimming, water aerobics, and biking. General instructions  Take over-the-counter and prescription medicines only as told by your health care provider.  Wear a night splint while sleeping, if told by your health care provider. Loosen the splint if your toes tingle, become numb, or turn cold and blue.  Maintain a healthy weight, or work with your health care provider to lose weight as needed.  Keep all follow-up visits. This is important. Contact a health care provider if you have:  Symptoms that do not go away with home treatment.  Pain that gets worse.  Pain that affects your ability to move or do daily activities. Summary  Plantar fasciitis is a painful foot condition that affects the heel. It occurs when the band of tissue that connects the toes to the heel bone (plantar fascia) becomes irritated.  Heel pain is the main symptom of this condition. It may get worse after exercising too much or standing still for a long time.  Treatment varies, but it usually starts with rest, ice, pressure (compression), and raising (elevating) the affected foot. This is called RICE therapy. Over-the-counter medicines can also be used to manage pain. This information is not intended to replace advice given to you by your health care provider. Make sure you discuss any questions you have with your health care provider. Document Revised: 07/11/2019 Document Reviewed: 07/11/2019 Elsevier Patient Education  2021 [employer].
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
## 2020-07-07 DIAGNOSIS — M8949 Other hypertrophic osteoarthropathy, multiple sites: Secondary | ICD-10-CM | POA: Insufficient documentation

## 2020-07-07 DIAGNOSIS — M150 Primary generalized (osteo)arthritis: Secondary | ICD-10-CM | POA: Insufficient documentation

## 2020-07-07 DIAGNOSIS — M159 Polyosteoarthritis, unspecified: Secondary | ICD-10-CM | POA: Insufficient documentation

## 2020-07-07 DIAGNOSIS — E785 Hyperlipidemia, unspecified: Secondary | ICD-10-CM | POA: Insufficient documentation

## 2020-07-07 NOTE — Assessment & Plan Note (Signed)
Continue with zolpidem as needed Not to take w/Norco  Potential benefits of a long term benzodiazepines  use as well as potential risks  and complications were explained to the patient and were aknowledged.

## 2020-07-07 NOTE — Assessment & Plan Note (Signed)
Continue with Norco prn.  Potential benefits of a long term opioids use as well as potential risks (i.e. addiction risk, apnea etc) and complications (i.e. Somnolence, constipation and others) were explained to the patient and were aknowledged.

## 2020-07-07 NOTE — Assessment & Plan Note (Signed)
Continue with pravastatin 

## 2020-07-07 NOTE — Assessment & Plan Note (Signed)
The patient was able to lose some weight

## 2020-08-01 ENCOUNTER — Telehealth: Payer: Self-pay | Admitting: Internal Medicine

## 2020-08-01 DIAGNOSIS — M8949 Other hypertrophic osteoarthropathy, multiple sites: Secondary | ICD-10-CM

## 2020-08-01 DIAGNOSIS — M159 Polyosteoarthritis, unspecified: Secondary | ICD-10-CM

## 2020-08-01 NOTE — Telephone Encounter (Signed)
1.Medication Requested: HYDROcodone-acetaminophen (NORCO/VICODIN) 5-325 MG tablet    2. Pharmacy (Name, Street, [place]): [id number] - [place], [place]  3. On Med List: yes   4. Last Visit with PCP: 06-25-20  5. Next visit date with PCP: 09-25-20   Agent: Please be advised that RX refills may take up to 3 business days. We ask that you follow-up with your pharmacy.

## 2020-08-02 NOTE — Telephone Encounter (Signed)
Check [place] registry last filled 07/03/2020.Marland KitchenJohny Lynn

## 2020-08-03 MED ORDER — HYDROCODONE-ACETAMINOPHEN 5-325 MG PO TABS: 1.0000 | ORAL_TABLET | Freq: Three times a day (TID) | ORAL | 0 refills | Status: DC | PRN

## 2020-08-03 NOTE — Telephone Encounter (Signed)
Okay; thanks.

## 2020-08-19 ENCOUNTER — Encounter (HOSPITAL_COMMUNITY): Payer: Self-pay | Admitting: Emergency Medicine

## 2020-08-19 ENCOUNTER — Ambulatory Visit (HOSPITAL_COMMUNITY)
Admission: EM | Admit: 2020-08-19 | Discharge: 2020-08-19 | Disposition: A | Discharge status: Home / Self Care | Payer: BC Managed Care – PPO | Attending: Emergency Medicine | Admitting: Emergency Medicine

## 2020-08-19 ENCOUNTER — Other Ambulatory Visit: Payer: Self-pay

## 2020-08-19 DIAGNOSIS — M5431 Sciatica, right side: Secondary | ICD-10-CM

## 2020-08-19 MED ORDER — KETOROLAC TROMETHAMINE 30 MG/ML IJ SOLN
INTRAMUSCULAR | Status: AC
  Filled 2020-08-19: qty 1

## 2020-08-19 MED ORDER — KETOROLAC TROMETHAMINE 30 MG/ML IJ SOLN
30.0000 mg | Freq: Once | INTRAMUSCULAR | Status: AC
  Administered 2020-08-19: 30 mg via INTRAMUSCULAR

## 2020-08-19 NOTE — Discharge Instructions (Signed)
Take Naproxen twice a day for pain.  You can also take muscle relaxer (tizanidine) as needed for muscle pain and spasms.    Use heat, ice, or alternate between heat and ice as needed for comfort.    If your symptoms do not improve in the next few days, follow up with orthopedics.    Follow up with your primary care.

## 2020-08-19 NOTE — ED Triage Notes (Addendum)
Pt presents with lower back pain with radiation into right leg. States has hx of pinched nerve.   States took previous rx of muscle relaxer with no relief.

## 2020-08-19 NOTE — ED Provider Notes (Signed)
[facility]    CSN: [id number] Arrival date & time: 08/19/20  1348      History   Chief Complaint Chief Complaint  Patient presents with  . Back Pain    HPI Jessica Lynn is a 60 y.o. female.   Patient here for evaluation of right sided back pain that radiates down into right buttocks and thigh.  Reports history of same but states that it is worse this time.  Reports taking naproxen with minimal relief.  Reports this episode of pain start yesterday.  Denies any loss of bowel or bladder control.  Denies any difficulty urinating or constipation.  Denies any trauma, injury, or other precipitating event. Denies any fevers, chest pain, shortness of breath, N/V/D, numbness, tingling, weakness, abdominal pain, or headaches.   ROS: As per HPI, all other pertinent ROS negative   The history is provided by the patient.  Back Pain   Past Medical History:  Diagnosis Date  . Allergic rhinitis    year around   . Allergy   . Atypical chest pain   . GERD (gastroesophageal reflux disease)   . Hypercholesterolemia   . Mild hypertension   . Obesity   . Postherpetic neuralgia    from shingles  . Shingles   . Snoring   . Somatic dysfunction     Patient Active Problem List   Diagnosis Date Noted  . Primary osteoarthritis involving multiple joints 07/07/2020  . Dyslipidemia 07/07/2020  . Well adult exam 09/07/2018  . Pruritus 07/08/2017  . 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 Shoulder pain, right 08/29/2015  . CTS (carpal tunnel syndrome) 11/17/2014  . Knee pain, bilateral 08/10/2014  . Arthralgia 10/27/2013  . Insomnia 10/27/2013  . Anxiety disorder 02/01/2013  . Acute sinusitis 08/02/2012  . Low back pain 08/05/2011  . Vitamin D deficiency 12/05/2010  . 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  . SHINGLES 08/15/2008  . HYPERCHOLESTEROLEMIA 08/15/2008  . Obesity 08/15/2008  . Essential hypertension 08/15/2008  . Allergic rhinitis 08/15/2008  .  GERD 08/15/2008  . SOMATIC DYSFUNCTION 08/15/2008  . SNORING 08/15/2008  . CHEST PAIN, ATYPICAL 08/15/2008    Past Surgical History:  Procedure Laterality Date  . DILATION AND CURETTAGE OF UTERUS     x2 w/ miscarriages  . TUBAL LIGATION    . VAGINAL DELIVERY     x2    OB History    Gravida  2   Para  2   Term  2   Preterm      AB      Living  2     SAB      IAB      Ectopic      Multiple      Live Births  2            Home Medications    Prior to Admission medications   Medication Sig Start Date End Date Taking? Authorizing Provider  Adalimumab 40 MG/0.8ML PNKT Inject 40 mg into the skin. 07/08/16   [provider]  Cholecalciferol (VITAMIN D3) 2000 units capsule Take 1 capsule (2,000 Units total) by mouth daily. 12/15/16   Plotnikov, Evie Lacks, MD  clonazePAM (KLONOPIN) 1 MG tablet Take 1/2 to 1  Tablet by mouth two times daily as needed for nerves 12/16/19   Plotnikov, Evie Lacks, MD  folic acid (FOLVITE) 1 MG tablet TAKE 1 TABLET(1 MG) BY MOUTH DAILY 04/08/20   Plotnikov, Evie Lacks, MD  HYDROcodone-acetaminophen (NORCO/VICODIN) [phone]  MG tablet Take 1 tablet by mouth 3 (three) times daily as needed for severe pain. 08/03/20 08/03/21  Plotnikov, Evie Lacks, MD  hydrOXYzine (ATARAX/VISTARIL) 25 MG tablet TAKE 1 TABLET BY MOUTH EVERY 8 HOURS AS NEEDED FOR ITCHING. 05/28/20   Plotnikov, Evie Lacks, MD  montelukast (SINGULAIR) 10 MG tablet Take 1 tablet (10 mg total) by mouth daily. 03/19/20 03/19/21  Plotnikov, Evie Lacks, MD  naproxen (NAPROSYN) 375 MG tablet Take 1 tablet (375 mg total) by mouth 2 (two) times daily with a meal. 05/22/20   Jaynee Eagles, PA-C  pravastatin (PRAVACHOL) 20 MG tablet Take 1 tablet (20 mg total) by mouth daily. 03/19/20   Plotnikov, Evie Lacks, MD  tiZANidine (ZANAFLEX) 4 MG tablet Take 1 tablet (4 mg total) by mouth at bedtime. 05/22/20   Jaynee Eagles, PA-C  valsartan-hydrochlorothiazide (DIOVAN-HCT) 160-12.5 MG tablet TAKE 1 TABLET BY  MOUTH DAILY 10/23/19   Plotnikov, Evie Lacks, MD  zolpidem (AMBIEN) 10 MG tablet 5-10 mg qhs prn 06/25/20   Plotnikov, Evie Lacks, MD    Family History Family History  Problem Relation Age of Onset  . Colon cancer Brother 62  . Colon polyps Mother   . Colon polyps Father   . Colon cancer Maternal Uncle   . Esophageal cancer Neg Hx   . Rectal cancer Neg Hx   . Stomach cancer Neg Hx     Social History Social History   Tobacco Use  . Smoking status: Former Smoker    Packs/day: 0.20    Years: 4.00    Pack years: 0.80    Types: Cigarettes    Quit date: 04/07/2006    Years since quitting: 14.3  . Smokeless tobacco: Never Used  Vaping Use  . Vaping Use: Never used  Substance Use Topics  . Alcohol use: No    Alcohol/week: 0.0 standard drinks    Comment: social use  . Drug use: No     Allergies   Crestor [rosuvastatin] and Oxycodone   Review of Systems Review of Systems  Musculoskeletal: Positive for back pain.  All other systems reviewed and are negative.    Physical Exam Triage Vital Signs ED Triage Vitals  Enc Vitals Group     BP 08/19/20 1507 133/84     Pulse Rate 08/19/20 1507 74     Resp 08/19/20 1507 18     Temp 08/19/20 1507 98.7 F (37.1 C)     Temp Source 08/19/20 1507 Oral     SpO2 08/19/20 1507 97 %     Weight --      Height --      Head Circumference --      Peak Flow --      Pain Score 08/19/20 1504 10     Pain Loc --      Pain Edu? --      Excl. in [place]? --    No data found.  Updated Vital Signs BP 133/84 (BP Location: Right Arm)   Pulse 74   Temp 98.7 F (37.1 C) (Oral)   Resp 18   LMP 06/06/2015 Comment: irregular   SpO2 97%   Visual Acuity Right Eye Distance:   Left Eye Distance:   Bilateral Distance:    Right Eye Near:   Left Eye Near:    Bilateral Near:     Physical Exam Vitals and nursing note reviewed.  Constitutional:      General: She is not in acute distress.    Appearance: Normal appearance.  She is not  ill-appearing, toxic-appearing or diaphoretic.  HENT:     Head: Normocephalic and atraumatic.  Eyes:     Conjunctiva/sclera: Conjunctivae normal.  Cardiovascular:     Rate and Rhythm: Normal rate.     Pulses: Normal pulses.  Pulmonary:     Effort: Pulmonary effort is normal.  Abdominal:     General: Abdomen is flat.  Musculoskeletal:     Cervical back: Normal range of motion.     Lumbar back: Tenderness present. No bony tenderness. Decreased range of motion (due to pain). Positive right straight leg raise test.  Skin:    General: Skin is warm and dry.  Neurological:     General: No focal deficit present.     Mental Status: She is alert and oriented to person, place, and time.  Psychiatric:        Mood and Affect: Mood normal.      UC Treatments / Results  Labs (all labs ordered are listed, but only abnormal results are displayed) Labs Reviewed - No data to display  EKG   Radiology No results found.  Procedures Procedures (including critical care time)  Medications Ordered in UC Medications  ketorolac (TORADOL) 30 MG/ML injection 30 mg (30 mg Intramuscular Given 08/19/20 1535)    Initial Impression / Assessment and Plan / UC Course  I have reviewed the triage vital signs and the nursing notes.  Pertinent labs & imaging results that were available during my care of the patient were reviewed by me and considered in my medical decision making (see chart for details).    Assessment negative for red flags or concerns.  This is likely sciatica.  Patient requesting a shot.  We will give Toradol IM in office.  Patient offered prescriptions for naproxen and muscle relaxer but states that she already has this at home.  Encourage patient to use heat, ice, or alternate between heat and ice for comfort.  If symptoms do not improve recommend following up with orthopedics or primary care provider. Final Clinical Impressions(s) / UC Diagnoses   Final diagnoses:  Sciatica of right  side     Discharge Instructions     Take Naproxen twice a day for pain.  You can also take muscle relaxer (tizanidine) as needed for muscle pain and spasms.    Use heat, ice, or alternate between heat and ice as needed for comfort.    If your symptoms do not improve in the next few days, follow up with orthopedics.    Follow up with your primary care.      ED Prescriptions    None     PDMP not reviewed this encounter.   Pearson Forster, NP 08/19/20 1538

## 2020-08-30 ENCOUNTER — Other Ambulatory Visit: Payer: Self-pay | Admitting: Internal Medicine

## 2020-09-04 ENCOUNTER — Telehealth: Payer: Self-pay | Admitting: Internal Medicine

## 2020-09-04 DIAGNOSIS — M8949 Other hypertrophic osteoarthropathy, multiple sites: Secondary | ICD-10-CM

## 2020-09-04 DIAGNOSIS — M159 Polyosteoarthritis, unspecified: Secondary | ICD-10-CM

## 2020-09-04 NOTE — Telephone Encounter (Signed)
Check [place] registry last filled 08/06/2020...lmb

## 2020-09-04 NOTE — Telephone Encounter (Signed)
1.Medication Requested: HYDROcodone-acetaminophen (NORCO/VICODIN) 5-325 MG tablet    2. Pharmacy (Name, Street, [place]): [id number] [place], [place]  3. On Med List: yes   4. Last Visit with PCP: 06-25-20  5. Next visit date with PCP: 09-05-20   Agent: Please be advised that RX refills may take up to 3 business days. We ask that you follow-up with your pharmacy.

## 2020-09-05 ENCOUNTER — Encounter: Payer: Self-pay | Admitting: Internal Medicine

## 2020-09-05 ENCOUNTER — Other Ambulatory Visit: Payer: Self-pay

## 2020-09-05 ENCOUNTER — Ambulatory Visit: Payer: BC Managed Care – PPO | Admitting: Internal Medicine

## 2020-09-05 DIAGNOSIS — I10 Essential (primary) hypertension: Secondary | ICD-10-CM

## 2020-09-05 DIAGNOSIS — M545 Low back pain, unspecified: Secondary | ICD-10-CM | POA: Diagnosis not present

## 2020-09-05 DIAGNOSIS — N62 Hypertrophy of breast: Secondary | ICD-10-CM | POA: Diagnosis not present

## 2020-09-05 LAB — URINALYSIS
Bilirubin Urine: NEGATIVE
Hgb urine dipstick: NEGATIVE
Ketones, ur: NEGATIVE
Leukocytes,Ua: NEGATIVE
Nitrite: NEGATIVE
Specific Gravity, Urine: <=1.005 — AB (ref 1.000–1.030)
Total Protein, Urine: NEGATIVE
Urine Glucose: NEGATIVE
Urobilinogen, UA: 0.2 (ref 0.0–1.0)
pH: 7 (ref 5.0–8.0)

## 2020-09-05 MED ORDER — HYDROCODONE-ACETAMINOPHEN 5-325 MG PO TABS
1.0000 | ORAL_TABLET | Freq: Three times a day (TID) | ORAL | 0 refills | Status: DC | PRN
Start: 2020-09-05 — End: 2020-10-05

## 2020-09-05 MED ORDER — METHYLPREDNISOLONE 4 MG PO TBPK
ORAL_TABLET | ORAL | 0 refills | Status: DC
Start: 2020-09-05 — End: 2020-11-14

## 2020-09-05 NOTE — Assessment & Plan Note (Signed)
Cont w/Valsartan HCT. Stable

## 2020-09-05 NOTE — Telephone Encounter (Signed)
Already have Scheduled appt for 09/25/20.Jessica KitchenJohny Chess

## 2020-09-05 NOTE — Addendum Note (Signed)
Addended by: Boris Lown B on: 09/05/2020 09:37 AM   Modules accepted: Orders

## 2020-09-05 NOTE — Assessment & Plan Note (Signed)
Breast reduction option to relieve neck, LBP was discussed w/pt
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
## 2020-09-05 NOTE — Telephone Encounter (Signed)
Okay.  Needs office visit every 3 months.  Thanks
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
## 2020-09-25 ENCOUNTER — Ambulatory Visit: Payer: BC Managed Care – PPO | Admitting: Internal Medicine

## 2020-10-03 ENCOUNTER — Telehealth: Payer: Self-pay | Admitting: Internal Medicine

## 2020-10-03 DIAGNOSIS — M159 Polyosteoarthritis, unspecified: Secondary | ICD-10-CM

## 2020-10-03 DIAGNOSIS — M8949 Other hypertrophic osteoarthropathy, multiple sites: Secondary | ICD-10-CM

## 2020-10-03 NOTE — Telephone Encounter (Signed)
1.Medication Requested: HYDROcodone-acetaminophen (NORCO/VICODIN) 5-325 MG tablet   2. Pharmacy (Name, Street, [place]): [id number] [place], [place]  3. On Med List: yes   4. Last Visit with PCP: 09-05-20  5. Next visit date with PCP: 12-06-20   Agent: Please be advised that RX refills may take up to 3 business days. We ask that you follow-up with your pharmacy.

## 2020-10-04 NOTE — Telephone Encounter (Signed)
Check [place] registry last filled 09/05/2020.Marland KitchenJohny Chess

## 2020-10-05 MED ORDER — HYDROCODONE-ACETAMINOPHEN 5-325 MG PO TABS: 1.0000 | ORAL_TABLET | Freq: Three times a day (TID) | ORAL | 0 refills | Status: DC | PRN

## 2020-10-05 NOTE — Telephone Encounter (Signed)
Ok Thx 

## 2020-10-14 ENCOUNTER — Other Ambulatory Visit: Payer: Self-pay | Admitting: Internal Medicine

## 2020-11-02 ENCOUNTER — Telehealth: Payer: Self-pay | Admitting: Internal Medicine

## 2020-11-02 DIAGNOSIS — M159 Polyosteoarthritis, unspecified: Secondary | ICD-10-CM

## 2020-11-02 DIAGNOSIS — M8949 Other hypertrophic osteoarthropathy, multiple sites: Secondary | ICD-10-CM

## 2020-11-02 DIAGNOSIS — G894 Chronic pain syndrome: Secondary | ICD-10-CM

## 2020-11-02 NOTE — Telephone Encounter (Signed)
Needs UDS as last is from years ago and was not consistent. Please call to come for sample and then re-route once this is collected.

## 2020-11-02 NOTE — Telephone Encounter (Signed)
Patient calling to request refill for HYDROcodone-acetaminophen (NORCO/VICODIN) 5-325 MG tablet   [id number] [place] [place]

## 2020-11-02 NOTE — Telephone Encounter (Signed)
Check [place] registry last filled 10/05/2020. MD is out of the office until 11/07/20. Pls advise.Marland KitchenJohny Lynn

## 2020-11-05 ENCOUNTER — Other Ambulatory Visit: Payer: BC Managed Care – PPO

## 2020-11-05 ENCOUNTER — Other Ambulatory Visit: Payer: Self-pay

## 2020-11-05 DIAGNOSIS — G894 Chronic pain syndrome: Secondary | ICD-10-CM

## 2020-11-05 NOTE — Telephone Encounter (Signed)
Notified pt w/MD response. Pt states she can come in today. Inform her will add to lab schedule.Marland KitchenJohny Chess

## 2020-11-06 MED ORDER — HYDROCODONE-ACETAMINOPHEN 5-325 MG PO TABS: 1.0000 | ORAL_TABLET | Freq: Three times a day (TID) | ORAL | 0 refills | Status: DC | PRN

## 2020-11-06 NOTE — Telephone Encounter (Signed)
Sent in refill of hydrocodone.

## 2020-11-06 NOTE — Telephone Encounter (Signed)
Pt came on yesterday to complete UDS.Jessica KitchenJohny Lynn

## 2020-11-06 NOTE — Addendum Note (Signed)
Addended by: Pricilla Holm A on: 11/06/2020 09:14 AM   Modules accepted: Orders

## 2020-11-06 NOTE — Telephone Encounter (Signed)
Notified pt MD sent rx to [facility]../lmb 

## 2020-11-07 LAB — DRUG MONITOR, OPIATES,W/CONF, URINE
Codeine: NEGATIVE ng/mL (ref <50)
Hydrocodone: NEGATIVE ng/mL (ref <50)
Hydromorphone: NEGATIVE ng/mL (ref <50)
Morphine: NEGATIVE ng/mL (ref <50)
Norhydrocodone: 54 ng/mL — ABNORMAL HIGH (ref <50)
Opiates: POSITIVE ng/mL — AB (ref <100)

## 2020-11-07 LAB — DRUG MONITOR, ALCOHOLMETAB, W/CONF, URINE: Alcohol Metabolites: NEGATIVE ng/mL (ref <500)

## 2020-11-07 LAB — PRESCRIBED DRUGS,MEDMATCH(R)

## 2020-11-07 LAB — DRUG MONITOR, TRAMADOL,QN, URINE
Desmethyltramadol: 475 ng/mL — ABNORMAL HIGH (ref <100)
Tramadol: 1323 ng/mL — ABNORMAL HIGH (ref <100)

## 2020-11-07 LAB — DRUG MONITOR, MARIJUANAMETAB, W/CONF, URINE: Marijuana Metabolite: NEGATIVE ng/mL (ref <20)

## 2020-11-07 LAB — DRUG MONITOR,BARBITURATE,W/CONF, URINE: Barbiturates: NEGATIVE ng/mL (ref <300)

## 2020-11-07 LAB — DM TEMPLATE

## 2020-11-07 LAB — DRUG MONITOR,AMPHETAMINE,W/CONF, URINE: Amphetamines: NEGATIVE ng/mL (ref <500)

## 2020-11-07 LAB — DRUG MONITOR, BENZO,W/CONF, URINE: Benzodiazepines: NEGATIVE ng/mL (ref <100)

## 2020-11-07 LAB — DRUG MONITOR, OXYCODONE,W/CONF, URINE: Oxycodone: NEGATIVE ng/mL (ref <100)

## 2020-11-07 LAB — DRUG MONITOR, COCAINEMETAB, W/CONF, URINE: Cocaine Metabolite: NEGATIVE ng/mL (ref <150)

## 2020-11-09 ENCOUNTER — Other Ambulatory Visit: Payer: Self-pay | Admitting: Internal Medicine

## 2020-11-14 ENCOUNTER — Ambulatory Visit: Payer: BC Managed Care – PPO | Admitting: Internal Medicine

## 2020-11-14 ENCOUNTER — Other Ambulatory Visit: Payer: Self-pay

## 2020-11-14 ENCOUNTER — Encounter: Payer: Self-pay | Admitting: Internal Medicine

## 2020-11-14 DIAGNOSIS — M25561 Pain in right knee: Secondary | ICD-10-CM

## 2020-11-14 DIAGNOSIS — M79606 Pain in leg, unspecified: Secondary | ICD-10-CM

## 2020-11-14 DIAGNOSIS — G8929 Other chronic pain: Secondary | ICD-10-CM

## 2020-11-14 DIAGNOSIS — M545 Low back pain, unspecified: Secondary | ICD-10-CM | POA: Diagnosis not present

## 2020-11-14 DIAGNOSIS — F419 Anxiety disorder, unspecified: Secondary | ICD-10-CM

## 2020-11-14 DIAGNOSIS — I10 Essential (primary) hypertension: Secondary | ICD-10-CM | POA: Diagnosis not present

## 2020-11-14 DIAGNOSIS — M25562 Pain in left knee: Secondary | ICD-10-CM

## 2020-11-14 MED ORDER — ZOLPIDEM TARTRATE 10 MG PO TABS: ORAL_TABLET | ORAL | 3 refills | Status: DC

## 2020-11-14 NOTE — Assessment & Plan Note (Signed)
Get good shoes -- [facility]

## 2020-11-14 NOTE — Assessment & Plan Note (Signed)
Clonazepam prn Not to take w/Norco  Potential benefits of a long term benzodiazepines  use as well as potential risks  and complications were explained to the patient and were aknowledged.

## 2020-11-14 NOTE — Patient Instructions (Signed)
Shoes: [facility]
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
## 2020-11-14 NOTE — Assessment & Plan Note (Signed)
LBP and RLE pain is worser
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
## 2020-11-14 NOTE — Assessment & Plan Note (Signed)
Valsartan HCT Risks associated with treatment noncompliance were discussed. Compliance was encouraged.

## 2020-11-29 ENCOUNTER — Other Ambulatory Visit: Payer: Self-pay | Admitting: Internal Medicine

## 2020-12-04 ENCOUNTER — Other Ambulatory Visit: Payer: Self-pay | Admitting: Internal Medicine

## 2020-12-04 DIAGNOSIS — Z1231 Encounter for screening mammogram for malignant neoplasm of breast: Secondary | ICD-10-CM

## 2020-12-06 ENCOUNTER — Telehealth: Payer: Self-pay | Admitting: Internal Medicine

## 2020-12-06 ENCOUNTER — Ambulatory Visit: Payer: BC Managed Care – PPO | Admitting: Internal Medicine

## 2020-12-06 DIAGNOSIS — M8949 Other hypertrophic osteoarthropathy, multiple sites: Secondary | ICD-10-CM

## 2020-12-06 DIAGNOSIS — M159 Polyosteoarthritis, unspecified: Secondary | ICD-10-CM

## 2020-12-06 NOTE — Telephone Encounter (Signed)
1.Medication Requested: HYDROcodone-acetaminophen (NORCO/VICODIN) 5-325 MG tablet  2. Pharmacy (Name, [place], [facility] [phone] - [place], [place] - [address] AT [place]  Phone:  [phone] Fax:  [phone]   3. On Med List: yes  4. Last Visit with PCP: 08.10.22  5. Next visit date with PCP: n/a   Agent: Please be advised that RX refills may take up to 3 business days. We ask that you follow-up with your pharmacy.

## 2020-12-07 MED ORDER — HYDROCODONE-ACETAMINOPHEN 5-325 MG PO TABS: 1.0000 | ORAL_TABLET | Freq: Three times a day (TID) | ORAL | 0 refills | Status: DC | PRN

## 2020-12-07 NOTE — Telephone Encounter (Signed)
Okay.  Done.  Thanks 

## 2020-12-23 ENCOUNTER — Other Ambulatory Visit: Payer: Self-pay | Admitting: Internal Medicine

## 2021-01-04 ENCOUNTER — Telehealth: Payer: Self-pay | Admitting: Internal Medicine

## 2021-01-04 DIAGNOSIS — M159 Polyosteoarthritis, unspecified: Secondary | ICD-10-CM

## 2021-01-04 NOTE — Telephone Encounter (Signed)
1.Medication Requested: HYDROcodone-acetaminophen (NORCO/VICODIN) 5-325 MG tablet  2. Pharmacy (Name, Street, [place]): [id number] [place] [place]  Phone:  [phone] Fax:  [phone]   3. On Med List: yes  4. Last Visit with PCP: 08.10.22  5. Next visit date with PCP: 11.10.22   Agent: Please be advised that RX refills may take up to 3 business days. We ask that you follow-up with your pharmacy.

## 2021-01-08 ENCOUNTER — Ambulatory Visit
Admission: RE | Admit: 2021-01-08 | Discharge: 2021-01-08 | Disposition: A | Discharge status: Home / Self Care | Payer: BC Managed Care – PPO | Source: Ambulatory Visit | Attending: Internal Medicine | Admitting: Internal Medicine

## 2021-01-08 ENCOUNTER — Other Ambulatory Visit: Payer: Self-pay

## 2021-01-08 DIAGNOSIS — Z1231 Encounter for screening mammogram for malignant neoplasm of breast: Secondary | ICD-10-CM

## 2021-01-08 MED ORDER — HYDROCODONE-ACETAMINOPHEN 5-325 MG PO TABS: 1.0000 | ORAL_TABLET | Freq: Three times a day (TID) | ORAL | 0 refills | Status: DC | PRN

## 2021-01-08 NOTE — Telephone Encounter (Signed)
OK. Thx

## 2021-01-14 ENCOUNTER — Ambulatory Visit: Payer: BC Managed Care – PPO

## 2021-02-07 ENCOUNTER — Telehealth: Payer: Self-pay | Admitting: Internal Medicine

## 2021-02-07 DIAGNOSIS — M159 Polyosteoarthritis, unspecified: Secondary | ICD-10-CM

## 2021-02-07 NOTE — Telephone Encounter (Signed)
1.Medication Requested: HYDROcodone-acetaminophen (NORCO/VICODIN) 5-325 MG tablet  2. Pharmacy (Name, Street, [place]): [id number] [place], [place]  3. On Med List: yes   4. Last Visit with PCP: 12-06-2020  5. Next visit date with PCP: 02-14-2021   Agent: Please be advised that RX refills may take up to 3 business days. We ask that you follow-up with your pharmacy.

## 2021-02-08 ENCOUNTER — Other Ambulatory Visit (INDEPENDENT_AMBULATORY_CARE_PROVIDER_SITE_OTHER): Payer: BC Managed Care – PPO

## 2021-02-08 ENCOUNTER — Other Ambulatory Visit: Payer: Self-pay

## 2021-02-08 ENCOUNTER — Telehealth: Payer: Self-pay | Admitting: Internal Medicine

## 2021-02-08 DIAGNOSIS — I10 Essential (primary) hypertension: Secondary | ICD-10-CM | POA: Diagnosis not present

## 2021-02-08 DIAGNOSIS — M545 Low back pain, unspecified: Secondary | ICD-10-CM

## 2021-02-08 LAB — COMPREHENSIVE METABOLIC PANEL
ALT: 61 U/L — ABNORMAL HIGH (ref 0–35)
AST: 40 U/L — ABNORMAL HIGH (ref 0–37)
Albumin: 4.1 g/dL (ref 3.5–5.2)
Alkaline Phosphatase: 60 U/L (ref 39–117)
BUN: 14 mg/dL (ref 6–23)
CO2: 30 mEq/L (ref 19–32)
Calcium: 9.4 mg/dL (ref 8.4–10.5)
Chloride: 102 mEq/L (ref 96–112)
Creatinine, Ser: 0.85 mg/dL (ref 0.40–1.20)
GFR: 75.21 mL/min (ref >60.00)
Glucose, Bld: 107 mg/dL — ABNORMAL HIGH (ref 70–99)
Potassium: 3.8 mEq/L (ref 3.5–5.1)
Sodium: 139 mEq/L (ref 135–145)
Total Bilirubin: 1 mg/dL (ref 0.2–1.2)
Total Protein: 7.3 g/dL (ref 6.0–8.3)

## 2021-02-08 NOTE — Telephone Encounter (Signed)
Patient requesting a call back to discuss lab orders for today

## 2021-02-08 NOTE — Telephone Encounter (Signed)
Called pt she states she wanted to know was the blood work fasting, but she didn't;t what before having done. Inform pt per chart no cholesterol panel was ordered so she could have ate. Only time you need tofast if he is checking cholesterol,,,/lmb

## 2021-02-09 LAB — PMP SCREEN PROFILE (10S), URINE
Amphetamine Scrn, Ur: NEGATIVE ng/mL
BARBITURATE SCREEN URINE: NEGATIVE ng/mL
BENZODIAZEPINE SCREEN, URINE: NEGATIVE ng/mL
CANNABINOIDS UR QL SCN: NEGATIVE ng/mL
Cocaine (Metab) Scrn, Ur: NEGATIVE ng/mL
Creatinine(Crt), U: 73 mg/dL (ref 20.0–300.0)
Methadone Screen, Urine: NEGATIVE ng/mL
OXYCODONE+OXYMORPHONE UR QL SCN: NEGATIVE ng/mL
Opiate Scrn, Ur: NEGATIVE ng/mL
Ph of Urine: 6.6 (ref 4.5–8.9)
Phencyclidine Qn, Ur: NEGATIVE ng/mL
Propoxyphene Scrn, Ur: NEGATIVE ng/mL

## 2021-02-09 MED ORDER — HYDROCODONE-ACETAMINOPHEN 5-325 MG PO TABS: 1.0000 | ORAL_TABLET | Freq: Three times a day (TID) | ORAL | 0 refills | Status: DC | PRN

## 2021-02-09 NOTE — Telephone Encounter (Signed)
Okay.  Needs office visit every 3 months.  Thanks

## 2021-02-11 NOTE — Telephone Encounter (Signed)
Appt 02/24/21.Marland KitchenJohny Lynn

## 2021-02-14 ENCOUNTER — Encounter: Payer: Self-pay | Admitting: Internal Medicine

## 2021-02-14 ENCOUNTER — Ambulatory Visit: Payer: BC Managed Care – PPO | Admitting: Internal Medicine

## 2021-02-14 ENCOUNTER — Other Ambulatory Visit: Payer: Self-pay

## 2021-02-14 DIAGNOSIS — F419 Anxiety disorder, unspecified: Secondary | ICD-10-CM

## 2021-02-14 DIAGNOSIS — G4700 Insomnia, unspecified: Secondary | ICD-10-CM

## 2021-02-14 DIAGNOSIS — M545 Low back pain, unspecified: Secondary | ICD-10-CM

## 2021-02-14 DIAGNOSIS — G8929 Other chronic pain: Secondary | ICD-10-CM

## 2021-02-14 DIAGNOSIS — I10 Essential (primary) hypertension: Secondary | ICD-10-CM | POA: Diagnosis not present

## 2021-02-14 DIAGNOSIS — M25561 Pain in right knee: Secondary | ICD-10-CM | POA: Diagnosis not present

## 2021-02-14 DIAGNOSIS — M25562 Pain in left knee: Secondary | ICD-10-CM

## 2021-02-14 DIAGNOSIS — M79606 Pain in leg, unspecified: Secondary | ICD-10-CM

## 2021-02-14 MED ORDER — ZOLPIDEM TARTRATE 10 MG PO TABS: ORAL_TABLET | ORAL | 3 refills | Status: DC

## 2021-02-14 NOTE — Patient Instructions (Signed)
[employer] insoles

## 2021-02-14 NOTE — Assessment & Plan Note (Signed)
Cont w/Clonazepam prn  Potential benefits of a long term benzodiazepines  use as well as potential risks  and complications were explained to the patient and were aknowledged. 

## 2021-02-14 NOTE — Assessment & Plan Note (Signed)
Cont on Valsartan HCT

## 2021-02-14 NOTE — Assessment & Plan Note (Signed)
Cont w/Zolpidem prn Not to take w/Norco  Potential benefits of a long term benzodiazepines  use as well as potential risks  and complications were explained to the patient and were aknowledged. 

## 2021-02-14 NOTE — Progress Notes (Signed)
Subjective:  Patient ID: Jessica Lynn, female    DOB: 1960-09-07  Age: 60 y.o. MRN: [id number]  CC: Follow-up (3 month f/u)   HPI Jessica Lynn presents for OA, HTN, knee pain, insomnia, foot pain  Outpatient Medications Prior to Visit  Medication Sig Dispense Refill   Adalimumab 40 MG/0.8ML PNKT Inject 40 mg into the skin.     azaTHIOprine (IMURAN) 50 MG tablet Take 100 mg by mouth daily. Take 1 tablet by mouth daily     brimonidine-timolol (COMBIGAN) 0.2-0.5 % ophthalmic solution Place 1 drop into both eyes 2 times daily.     Cholecalciferol (VITAMIN D3) 2000 units capsule Take 1 capsule (2,000 Units total) by mouth daily. 100 capsule 3   clonazePAM (KLONOPIN) 1 MG tablet Take 1/2 to 1  Tablet by mouth two times daily as needed for nerves 60 tablet 3   dorzolamide-timolol (COSOPT) 22.3-6.8 MG/ML ophthalmic solution 1 drop 2 (two) times daily. 1 drop 2 (two) times daily     folic acid (FOLVITE) 1 MG tablet TAKE 1 TABLET(1 MG) BY MOUTH DAILY 100 tablet 3   HYDROcodone-acetaminophen (NORCO/VICODIN) 5-325 MG tablet Take 1 tablet by mouth 3 (three) times daily as needed for severe pain. 90 tablet 0   hydrOXYzine (ATARAX/VISTARIL) 25 MG tablet TAKE 1 TABLET BY MOUTH EVERY 8 HOURS AS NEEDED FOR ITCHING 270 tablet 1   montelukast (SINGULAIR) 10 MG tablet TAKE 1 TABLET(10 MG) BY MOUTH DAILY 90 tablet 3   naproxen (NAPROSYN) 500 MG tablet TAKE 1 TABLET BY MOUTH TWICE DAILY WITH FOOD FOR MODERATE PAIN 60 tablet 3   pravastatin (PRAVACHOL) 20 MG tablet Take 1 tablet (20 mg total) by mouth daily. 90 tablet 3   prednisoLONE acetate (PRED FORTE) 1 % ophthalmic suspension 1 drop 4 (four) times daily. 1 drop 4 (four) times daily.     valsartan-hydrochlorothiazide (DIOVAN-HCT) 160-12.5 MG tablet TAKE 1 TABLET BY MOUTH DAILY 30 tablet 11   zolpidem (AMBIEN) 10 MG tablet TAKE 1 TABLET(10 MG) BY MOUTH AT BEDTIME AS NEEDED FOR SLEEP 30 tablet 3   zolpidem (AMBIEN) 10 MG tablet 5-10 mg qhs prn (Patient not  taking: Reported on 02/14/2021) 30 tablet 3   No facility-administered medications prior to visit.    ROS: Review of Systems  Constitutional:  Positive for fatigue. Negative for activity change, appetite change, chills and unexpected weight change.  HENT:  Negative for congestion, mouth sores and sinus pressure.   Eyes:  Negative for visual disturbance.  Respiratory:  Negative for cough and chest tightness.   Gastrointestinal:  Negative for abdominal pain and nausea.  Genitourinary:  Negative for difficulty urinating, frequency and vaginal pain.  Musculoskeletal:  Positive for arthralgias, back pain and gait problem.  Skin:  Negative for pallor and rash.  Neurological:  Negative for dizziness, tremors, weakness, numbness and headaches.  Psychiatric/Behavioral:  Negative for confusion, sleep disturbance and suicidal ideas.    Objective:  BP 102/62 (BP Location: Left Arm)   Pulse 65   Temp 98.9 F (37.2 C) (Oral)   Ht 5\' 5"  (1.651 m)   Wt 218 lb 12.8 oz (99.2 kg)   LMP 06/06/2015 Comment: irregular   SpO2 98%   BMI 36.41 kg/m   BP Readings from Last 3 Encounters:  02/14/21 102/62  11/14/20 134/88  09/05/20 138/80    Wt Readings from Last 3 Encounters:  02/14/21 218 lb 12.8 oz (99.2 kg)  11/14/20 231 lb 12.8 oz (105.1 kg)  09/05/20 235 lb  6.4 oz (106.8 kg)    Physical Exam Constitutional:      General: She is not in acute distress.    Appearance: She is well-developed. She is obese.  HENT:     Head: Normocephalic.     Right Ear: External ear normal.     Left Ear: External ear normal.     Nose: Nose normal.  Eyes:     General:        Right eye: No discharge.        Left eye: No discharge.     Conjunctiva/sclera: Conjunctivae normal.     Pupils: Pupils are equal, round, and reactive to light.  Neck:     Thyroid: No thyromegaly.     Vascular: No JVD.     Trachea: No tracheal deviation.  Cardiovascular:     Rate and Rhythm: Normal rate and regular rhythm.      Heart sounds: Normal heart sounds.  Pulmonary:     Effort: No respiratory distress.     Breath sounds: No stridor. No wheezing.  Abdominal:     General: Bowel sounds are normal. There is no distension.     Palpations: Abdomen is soft. There is no mass.     Tenderness: There is no abdominal tenderness. There is no guarding or rebound.  Musculoskeletal:        General: Tenderness present.     Cervical back: Normal range of motion and neck supple. No rigidity.  Lymphadenopathy:     Cervical: No cervical adenopathy.  Skin:    Findings: No erythema or rash.  Neurological:     Cranial Nerves: No cranial nerve deficit.     Motor: No abnormal muscle tone.     Coordination: Coordination normal.     Deep Tendon Reflexes: Reflexes normal.  Psychiatric:        Behavior: Behavior normal.        Thought Content: Thought content normal.        Judgment: Judgment normal.    Lab Results  Component Value Date   WBC 7.2 12/16/2019   HGB 11.6 (L) 12/16/2019   HCT 34.4 (L) 12/16/2019   PLT 262 12/16/2019   GLUCOSE 107 (H) 02/08/2021   CHOL 148 12/16/2019   TRIG 71 12/16/2019   HDL 70 12/16/2019   LDLDIRECT 160.7 02/06/2012   LDLCALC 63 12/16/2019   ALT 61 (H) 02/08/2021   AST 40 (H) 02/08/2021   NA 139 02/08/2021   K 3.8 02/08/2021   CL 102 02/08/2021   CREATININE 0.85 02/08/2021   BUN 14 02/08/2021   CO2 30 02/08/2021   TSH 0.93 12/16/2019   HGBA1C 5.8 (H) 12/16/2019    MM 3D SCREEN BREAST BILATERAL  Result Date: 01/12/2021 CLINICAL DATA:  Screening. EXAM: DIGITAL SCREENING BILATERAL MAMMOGRAM WITH TOMOSYNTHESIS AND CAD TECHNIQUE: Bilateral screening digital craniocaudal and mediolateral oblique mammograms were obtained. Bilateral screening digital breast tomosynthesis was performed. The images were evaluated with computer-aided detection. COMPARISON:  Previous exam(s). ACR Breast Density Category a: The breast tissue is almost entirely fatty. FINDINGS: There are no findings  suspicious for malignancy. IMPRESSION: No mammographic evidence of malignancy. A result letter of this screening mammogram will be mailed directly to the patient. RECOMMENDATION: Screening mammogram in one year. (Code:SM-B-01Y) BI-RADS CATEGORY  1: Negative. Electronically Signed   By: Everlean Alstrom M.D.   On: 01/12/2021 08:51    Assessment & Plan:   Problem List Items Addressed This Visit     Anxiety disorder  Cont w/Clonazepam prn  Potential benefits of a long term benzodiazepines  use as well as potential risks  and complications were explained to the patient and were aknowledged.       Essential hypertension    Cont on Valsartan HCT      Insomnia    Cont w/Zolpidem prn Not to take w/Norco  Potential benefits of a long term benzodiazepines  use as well as potential risks  and complications were explained to the patient and were aknowledged.      Knee pain, bilateral    Norco prn  Potential benefits of a long term opioids use as well as potential risks (i.e. addiction risk, apnea etc) and complications (i.e. Somnolence, constipation and others) were explained to the patient and were aknowledged.      Low back pain radiating down leg    Get good shoes -- [employer] NB         Meds ordered this encounter  Medications   zolpidem (AMBIEN) 10 MG tablet    Sig: TAKE 1 TABLET(10 MG) BY MOUTH AT BEDTIME AS NEEDED FOR SLEEP    Dispense:  30 tablet    Refill:  3      Follow-up: Return in about 3 months (around 05/17/2021) for a follow-up visit.  Walker Kehr, MD

## 2021-02-14 NOTE — Assessment & Plan Note (Signed)
Norco prn  Potential benefits of a long term opioids use as well as potential risks (i.e. addiction risk, apnea etc) and complications (i.e. Somnolence, constipation and others) were explained to the patient and were aknowledged. 

## 2021-02-17 NOTE — Assessment & Plan Note (Signed)
Get good shoes -- [facility]

## 2021-03-08 ENCOUNTER — Telehealth: Payer: Self-pay | Admitting: Internal Medicine

## 2021-03-08 NOTE — Telephone Encounter (Signed)
Check [place] registry last filled 02/09/2021.Marland KitchenJohny Lynn

## 2021-03-08 NOTE — Telephone Encounter (Signed)
1.Medication Requested: HYDROcodone-acetaminophen (NORCO/VICODIN) 5-325 MG tablet 2. Pharmacy (Name, Street, [place]): [id number] [place], [place] Phone:  [phone]  Fax:  [phone]     3. On Med List: Y  4. Last Visit with PCP:02/14/2021  5. Next visit date with PCP: 05/20/2021   Agent: Please be advised that RX refills may take up to 3 business days. We ask that you follow-up with your pharmacy.

## 2021-03-10 ENCOUNTER — Other Ambulatory Visit: Payer: Self-pay | Admitting: Internal Medicine

## 2021-03-10 DIAGNOSIS — M159 Polyosteoarthritis, unspecified: Secondary | ICD-10-CM

## 2021-03-10 MED ORDER — HYDROCODONE-ACETAMINOPHEN 5-325 MG PO TABS: 1.0000 | ORAL_TABLET | Freq: Three times a day (TID) | ORAL | 0 refills | Status: DC | PRN

## 2021-03-10 NOTE — Progress Notes (Signed)
OK 

## 2021-04-02 ENCOUNTER — Other Ambulatory Visit: Payer: Self-pay | Admitting: Internal Medicine

## 2021-04-10 ENCOUNTER — Telehealth: Payer: Self-pay | Admitting: Internal Medicine

## 2021-04-10 DIAGNOSIS — M159 Polyosteoarthritis, unspecified: Secondary | ICD-10-CM

## 2021-04-10 NOTE — Telephone Encounter (Signed)
1.Medication Requested: HYDROcodone-acetaminophen (NORCO/VICODIN) 5-325 MG tablet  2. Pharmacy (Name, Street, [place]): [id number] [place], [place]  3. On Med List: yes   4. Last Visit with PCP: 02-14-2021  5. Next visit date with PCP: 05-20-2021   Agent: Please be advised that RX refills may take up to 3 business days. We ask that you follow-up with your pharmacy.

## 2021-04-12 NOTE — Telephone Encounter (Signed)
Patient calling in  Calling to check status of refill request

## 2021-04-12 NOTE — Telephone Encounter (Signed)
MD is out of the office, refill is on MD desk for approval../lmb

## 2021-04-14 NOTE — Telephone Encounter (Signed)
Okay.  Thanks.

## 2021-04-15 MED ORDER — HYDROCODONE-ACETAMINOPHEN 5-325 MG PO TABS: 1.0000 | ORAL_TABLET | Freq: Three times a day (TID) | ORAL | 0 refills | Status: DC | PRN

## 2021-05-03 ENCOUNTER — Other Ambulatory Visit: Payer: Self-pay | Admitting: Internal Medicine

## 2021-05-15 ENCOUNTER — Telehealth: Payer: Self-pay

## 2021-05-15 DIAGNOSIS — M159 Polyosteoarthritis, unspecified: Secondary | ICD-10-CM

## 2021-05-15 NOTE — Telephone Encounter (Signed)
Pt is requesting a refill on: HYDROcodone-acetaminophen (NORCO/VICODIN) 5-325 MG tablet  [id number] [place], [place]  LOV 02/14/21  Pt CB [phone]

## 2021-05-15 NOTE — Telephone Encounter (Signed)
Pt calling in for PA for zolpidem (AMBIEN) 10 MG tablet for this year.  Pt states that [facility] provided her with this number for the nurse to call. [phone]   Pt CB [phone]

## 2021-05-16 MED ORDER — HYDROCODONE-ACETAMINOPHEN 5-325 MG PO TABS: 1.0000 | ORAL_TABLET | Freq: Three times a day (TID) | ORAL | 0 refills | Status: DC | PRN

## 2021-05-16 NOTE — Telephone Encounter (Signed)
Okay.  Thanks.

## 2021-05-20 ENCOUNTER — Other Ambulatory Visit: Payer: Self-pay | Admitting: Internal Medicine

## 2021-05-20 ENCOUNTER — Ambulatory Visit: Payer: BC Managed Care – PPO | Admitting: Internal Medicine

## 2021-05-21 ENCOUNTER — Other Ambulatory Visit: Payer: Self-pay

## 2021-05-21 ENCOUNTER — Encounter: Payer: Self-pay | Admitting: Internal Medicine

## 2021-05-21 ENCOUNTER — Ambulatory Visit: Payer: BC Managed Care – PPO | Admitting: Internal Medicine

## 2021-05-21 VITALS — BP 130/76 | HR 79 | Temp 98.1°F | Ht 65.0 in | Wt 209.0 lb

## 2021-05-21 DIAGNOSIS — M79606 Pain in leg, unspecified: Secondary | ICD-10-CM

## 2021-05-21 DIAGNOSIS — F419 Anxiety disorder, unspecified: Secondary | ICD-10-CM | POA: Diagnosis not present

## 2021-05-21 DIAGNOSIS — I10 Essential (primary) hypertension: Secondary | ICD-10-CM

## 2021-05-21 DIAGNOSIS — M79671 Pain in right foot: Secondary | ICD-10-CM

## 2021-05-21 DIAGNOSIS — M545 Low back pain, unspecified: Secondary | ICD-10-CM

## 2021-05-21 DIAGNOSIS — M159 Polyosteoarthritis, unspecified: Secondary | ICD-10-CM

## 2021-05-21 DIAGNOSIS — Z6841 Body Mass Index (BMI) 40.0 and over, adult: Secondary | ICD-10-CM

## 2021-05-21 MED ORDER — HYDROCODONE-ACETAMINOPHEN 5-325 MG PO TABS: 1.0000 | ORAL_TABLET | Freq: Three times a day (TID) | ORAL | 0 refills | Status: DC | PRN

## 2021-05-21 NOTE — Progress Notes (Signed)
Subjective:  Patient ID: Jessica Lynn, female    DOB: 30-Oct-1960  Age: 61 y.o. MRN: [id number]  CC: No chief complaint on file.   HPI Jessica Lynn presents for R foot pain, LBP, anxiety  Outpatient Medications Prior to Visit  Medication Sig Dispense Refill   Adalimumab 40 MG/0.8ML PNKT Inject 40 mg into the skin.     azaTHIOprine (IMURAN) 50 MG tablet Take 100 mg by mouth daily. Take 1 tablet by mouth daily     brimonidine-timolol (COMBIGAN) 0.2-0.5 % ophthalmic solution Place 1 drop into both eyes 2 times daily.     Cholecalciferol (VITAMIN D3) 2000 units capsule Take 1 capsule (2,000 Units total) by mouth daily. 100 capsule 3   clonazePAM (KLONOPIN) 1 MG tablet Take 1/2 to 1  Tablet by mouth two times daily as needed for nerves 60 tablet 3   dorzolamide-timolol (COSOPT) 22.3-6.8 MG/ML ophthalmic solution 1 drop 2 (two) times daily. 1 drop 2 (two) times daily     folic acid (FOLVITE) 1 MG tablet TAKE 1 TABLET(1 MG) BY MOUTH DAILY 100 tablet 3   hydrOXYzine (ATARAX/VISTARIL) 25 MG tablet TAKE 1 TABLET BY MOUTH EVERY 8 HOURS AS NEEDED FOR ITCHING 270 tablet 1   montelukast (SINGULAIR) 10 MG tablet TAKE 1 TABLET(10 MG) BY MOUTH DAILY 90 tablet 3   naproxen (NAPROSYN) 500 MG tablet TAKE 1 TABLET BY MOUTH TWICE DAILY WITH FOOD FOR MODERATE PAIN 60 tablet 3   pravastatin (PRAVACHOL) 20 MG tablet TAKE 1 TABLET(20 MG) BY MOUTH DAILY 90 tablet 3   prednisoLONE acetate (PRED FORTE) 1 % ophthalmic suspension 1 drop 4 (four) times daily. 1 drop 4 (four) times daily.     valsartan-hydrochlorothiazide (DIOVAN-HCT) 160-12.5 MG tablet TAKE 1 TABLET BY MOUTH DAILY 30 tablet 11   zolpidem (AMBIEN) 10 MG tablet TAKE 1 TABLET(10 MG) BY MOUTH AT BEDTIME AS NEEDED FOR SLEEP 30 tablet 3   HYDROcodone-acetaminophen (NORCO/VICODIN) 5-325 MG tablet Take 1 tablet by mouth 3 (three) times daily as needed for severe pain. 90 tablet 0   No facility-administered medications prior to visit.    ROS: Review of  Systems  Constitutional:  Positive for fatigue. Negative for activity change, appetite change, chills and unexpected weight change.  HENT:  Negative for congestion, mouth sores and sinus pressure.   Eyes:  Negative for visual disturbance.  Respiratory:  Negative for cough and chest tightness.   Gastrointestinal:  Negative for abdominal pain and nausea.  Genitourinary:  Negative for difficulty urinating, frequency and vaginal pain.  Musculoskeletal:  Positive for arthralgias, back pain and gait problem.  Skin:  Negative for pallor and rash.  Neurological:  Negative for dizziness, tremors, weakness, numbness and headaches.  Psychiatric/Behavioral:  Negative for confusion and sleep disturbance. The patient is nervous/anxious.    Objective:  BP 130/76 (BP Location: Left Arm, Patient Position: Sitting, Cuff Size: Large)    Pulse 79    Temp 98.1 F (36.7 C) (Oral)    Ht 5\' 5"  (1.651 m)    Wt 209 lb (94.8 kg)    LMP 06/06/2015 Comment: irregular    SpO2 99%    BMI 34.78 kg/m   BP Readings from Last 3 Encounters:  05/21/21 130/76  02/14/21 102/62  11/14/20 134/88    Wt Readings from Last 3 Encounters:  05/21/21 209 lb (94.8 kg)  02/14/21 218 lb 12.8 oz (99.2 kg)  11/14/20 231 lb 12.8 oz (105.1 kg)    Physical Exam Constitutional:  General: She is not in acute distress.    Appearance: She is well-developed. She is obese.  HENT:     Head: Normocephalic.     Right Ear: External ear normal.     Left Ear: External ear normal.     Nose: Nose normal.  Eyes:     General:        Right eye: No discharge.        Left eye: No discharge.     Conjunctiva/sclera: Conjunctivae normal.     Pupils: Pupils are equal, round, and reactive to light.  Neck:     Thyroid: No thyromegaly.     Vascular: No JVD.     Trachea: No tracheal deviation.  Cardiovascular:     Rate and Rhythm: Normal rate and regular rhythm.     Heart sounds: Normal heart sounds.  Pulmonary:     Effort: No respiratory  distress.     Breath sounds: No stridor. No wheezing.  Abdominal:     General: Bowel sounds are normal. There is no distension.     Palpations: Abdomen is soft. There is no mass.     Tenderness: There is no abdominal tenderness. There is no guarding or rebound.  Musculoskeletal:        General: No tenderness.     Cervical back: Normal range of motion and neck supple. No rigidity.  Lymphadenopathy:     Cervical: No cervical adenopathy.  Skin:    Findings: No erythema or rash.  Neurological:     Cranial Nerves: No cranial nerve deficit.     Motor: No abnormal muscle tone.     Coordination: Coordination normal.     Deep Tendon Reflexes: Reflexes normal.  Psychiatric:        Behavior: Behavior normal.        Thought Content: Thought content normal.        Judgment: Judgment normal.    Lab Results  Component Value Date   WBC 7.2 12/16/2019   HGB 11.6 (L) 12/16/2019   HCT 34.4 (L) 12/16/2019   PLT 262 12/16/2019   GLUCOSE 107 (H) 02/08/2021   CHOL 148 12/16/2019   TRIG 71 12/16/2019   HDL 70 12/16/2019   LDLDIRECT 160.7 02/06/2012   LDLCALC 63 12/16/2019   ALT 61 (H) 02/08/2021   AST 40 (H) 02/08/2021   NA 139 02/08/2021   K 3.8 02/08/2021   CL 102 02/08/2021   CREATININE 0.85 02/08/2021   BUN 14 02/08/2021   CO2 30 02/08/2021   TSH 0.93 12/16/2019   HGBA1C 5.8 (H) 12/16/2019    MM 3D SCREEN BREAST BILATERAL  Result Date: 01/12/2021 CLINICAL DATA:  Screening. EXAM: DIGITAL SCREENING BILATERAL MAMMOGRAM WITH TOMOSYNTHESIS AND CAD TECHNIQUE: Bilateral screening digital craniocaudal and mediolateral oblique mammograms were obtained. Bilateral screening digital breast tomosynthesis was performed. The images were evaluated with computer-aided detection. COMPARISON:  Previous exam(s). ACR Breast Density Category a: The breast tissue is almost entirely fatty. FINDINGS: There are no findings suspicious for malignancy. IMPRESSION: No mammographic evidence of malignancy. A result  letter of this screening mammogram will be mailed directly to the patient. RECOMMENDATION: Screening mammogram in one year. (Code:SM-B-01Y) BI-RADS CATEGORY  1: Negative. Electronically Signed   By: Everlean Alstrom M.D.   On: 01/12/2021 08:51    Assessment & Plan:   Problem List Items Addressed This Visit     Anxiety disorder    Cont w/Clonazepam prn  Potential benefits of a long term benzodiazepines  use  as well as potential risks  and complications were explained to the patient and were aknowledged. Not to take w/Norco      Essential hypertension    Cont on Valsartan HCT      Foot pain, right - Primary    Worse Podiatry ref      Relevant Orders   Ambulatory referral to Podiatry   Low back pain radiating down leg    Cont w/Norco prn  Potential benefits of a long term opioids use as well as potential risks (i.e. addiction risk, apnea etc) and complications (i.e. Somnolence, constipation and others) were explained to the patient and were aknowledged.  Get good shoes -- [facility] NB      Relevant Medications   HYDROcodone-acetaminophen (NORCO/VICODIN) 5-325 MG tablet   HYDROcodone-acetaminophen (NORCO/VICODIN) 5-325 MG tablet   Obesity    Better on diet/fasting      Primary osteoarthritis involving multiple joints   Relevant Medications   HYDROcodone-acetaminophen (NORCO/VICODIN) 5-325 MG tablet   HYDROcodone-acetaminophen (NORCO/VICODIN) 5-325 MG tablet      Meds ordered this encounter  Medications   HYDROcodone-acetaminophen (NORCO/VICODIN) 5-325 MG tablet    Sig: Take 1 tablet by mouth 3 (three) times daily as needed for severe pain.    Dispense:  90 tablet    Refill:  0    Please fill on or after 06/14/21.  Office visit every 3 months   HYDROcodone-acetaminophen (NORCO/VICODIN) 5-325 MG tablet    Sig: Take 1 tablet by mouth 3 (three) times daily as needed for severe pain.    Dispense:  90 tablet    Refill:  0    Please fill on or after 05/16/21.  Office  visit every 3 months      Follow-up: Return in about 3 months (around 08/18/2021) for a follow-up visit.  Walker Kehr, MD

## 2021-05-21 NOTE — Assessment & Plan Note (Signed)
Better on diet/fasting

## 2021-05-21 NOTE — Assessment & Plan Note (Signed)
Cont on Valsartan HCT

## 2021-05-21 NOTE — Assessment & Plan Note (Signed)
Cont w/Clonazepam prn  Potential benefits of a long term benzodiazepines  use as well as potential risks  and complications were explained to the patient and were aknowledged. Not to take w/Norco 

## 2021-05-21 NOTE — Assessment & Plan Note (Signed)
Worse Podiatry ref

## 2021-05-21 NOTE — Assessment & Plan Note (Signed)
Cont w/Norco prn  Potential benefits of a long term opioids use as well as potential risks (i.e. addiction risk, apnea etc) and complications (i.e. Somnolence, constipation and others) were explained to the patient and were aknowledged.  Get good shoes -- [employer] NB

## 2021-05-21 NOTE — Patient Instructions (Addendum)
Get good shoes -- [facility] NB  Sign up for [employer] ( via [employer] on your phone or your ipad). If you don't have a Art therapist card  - go to [employer] branch. They will set you up in 15 minutes. It is free. You can check out books to read and to listen, check out magazines and newspapers, movies etc.   The Obesity Code book by Sharman Cheek   These suggestions will probably help you to improve your metabolism if you are not overweight and to lose weight if you are overweight: 1.  Reduce your consumption of sugars and starches.  Eliminate high fructose corn syrup from your diet.  Reduce your consumption of processed foods.  For desserts try to have seasonal fruits, berries, nuts, cheeses or dark chocolate with more than 70% cacao. 2.  Do not snack 3.  You do not have to eat breakfast.  If you choose to have breakfast - eat plain greek yogurt, eggs, oatmeal (without sugar) - use honey if you need to. 4.  Drink water, freshly brewed unsweetened tea (green, black or herbal) or coffee.  Do not drink sodas including diet sodas , juices, beverages sweetened with artificial sweeteners. 5.  Reduce your consumption of refined grains. 6.  Avoid protein drinks such as Optifast, Slim fast etc. Eat chicken, fish, meat, dairy and beans for your sources of protein. 7.  Natural unprocessed fats like cold pressed virgin olive oil, butter, coconut oil are good for you.  Eat avocados. 8.  Increase your consumption of fiber.  Fruits, berries, vegetables, whole grains, flaxseed, chia seeds, beans, popcorn, nuts, oatmeal are good sources of fiber 9.  Use vinegar in your diet, i.e. apple cider vinegar, red wine or balsamic vinegar 10.  You can try fasting.  For example you can skip breakfast and lunch every other day (24-hour fast) 11.  Stress reduction, good night sleep, relaxation, meditation, yoga and other physical activity is likely to help you to maintain low weight too. 12.  If you drink alcohol,  limit your alcohol intake to no more than 2 drinks a day.

## 2021-06-13 ENCOUNTER — Ambulatory Visit: Payer: BC Managed Care – PPO | Admitting: Sports Medicine

## 2021-06-20 ENCOUNTER — Telehealth: Payer: Self-pay

## 2021-06-20 DIAGNOSIS — M159 Polyosteoarthritis, unspecified: Secondary | ICD-10-CM

## 2021-06-20 NOTE — Telephone Encounter (Signed)
Pt requesting a refill for: ?HYDROcodone-acetaminophen (NORCO/VICODIN) 5-325 MG tablet ? ?Pharmacy: ?[id number] - [place], [place] - [address] AT [place] [address] ? ?LOV 05/21/21 ?

## 2021-06-21 MED ORDER — HYDROCODONE-ACETAMINOPHEN 5-325 MG PO TABS: 1.0000 | ORAL_TABLET | Freq: Three times a day (TID) | ORAL | 0 refills | Status: DC | PRN

## 2021-06-21 NOTE — Telephone Encounter (Signed)
Okay.  Thanks.

## 2021-06-21 NOTE — Telephone Encounter (Signed)
Patient calling in ? ?Rx was suppose to be sent to [facility] & [facility].[name] please resubmit  ? ?[id number] - [place], [place] - [address] AT [place] Phone:  [phone]  ?Fax:  [phone]  ?  ? ?

## 2021-06-22 MED ORDER — HYDROCODONE-ACETAMINOPHEN 5-325 MG PO TABS: 1.0000 | ORAL_TABLET | Freq: Three times a day (TID) | ORAL | 0 refills | Status: DC | PRN

## 2021-06-22 NOTE — Telephone Encounter (Signed)
Okay.  Thanks.

## 2021-06-25 ENCOUNTER — Other Ambulatory Visit: Payer: Self-pay

## 2021-06-25 ENCOUNTER — Emergency Department (HOSPITAL_BASED_OUTPATIENT_CLINIC_OR_DEPARTMENT_OTHER)
Admission: EM | Admit: 2021-06-25 | Discharge: 2021-06-25 | Disposition: A | Discharge status: Home / Self Care | Payer: BC Managed Care – PPO | Attending: Emergency Medicine | Admitting: Emergency Medicine

## 2021-06-25 ENCOUNTER — Encounter (HOSPITAL_BASED_OUTPATIENT_CLINIC_OR_DEPARTMENT_OTHER): Payer: Self-pay

## 2021-06-25 DIAGNOSIS — Z79899 Other long term (current) drug therapy: Secondary | ICD-10-CM | POA: Diagnosis not present

## 2021-06-25 DIAGNOSIS — Z23 Encounter for immunization: Secondary | ICD-10-CM | POA: Insufficient documentation

## 2021-06-25 DIAGNOSIS — I10 Essential (primary) hypertension: Secondary | ICD-10-CM | POA: Insufficient documentation

## 2021-06-25 DIAGNOSIS — S61213A Laceration without foreign body of left middle finger without damage to nail, initial encounter: Secondary | ICD-10-CM | POA: Diagnosis not present

## 2021-06-25 DIAGNOSIS — W260XXA Contact with knife, initial encounter: Secondary | ICD-10-CM | POA: Diagnosis not present

## 2021-06-25 DIAGNOSIS — Y93G3 Activity, cooking and baking: Secondary | ICD-10-CM | POA: Insufficient documentation

## 2021-06-25 DIAGNOSIS — S6992XA Unspecified injury of left wrist, hand and finger(s), initial encounter: Secondary | ICD-10-CM | POA: Diagnosis present

## 2021-06-25 MED ORDER — TETANUS-DIPHTH-ACELL PERTUSSIS 5-2.5-18.5 LF-MCG/0.5 IM SUSY
0.5000 mL | PREFILLED_SYRINGE | Freq: Once | INTRAMUSCULAR | Status: AC
  Administered 2021-06-25: 0.5 mL via INTRAMUSCULAR
  Filled 2021-06-25: qty 0.5

## 2021-06-25 NOTE — Discharge Instructions (Signed)
Keep the wound covered with a Band-Aid or other sterile dressing. ? ?Allow the Dermabond to fall off on its own.   ? ?Keep the wound clean and dry. ? ?Return to the emergency department for pus draining from the wound, red streaks extending up or down the hand, increasing pain, or for other new and concerning symptoms. ?

## 2021-06-25 NOTE — ED Notes (Signed)
Wound was cleaned and irrigated with wound cleaner and a 222m of NS.  ?

## 2021-06-25 NOTE — ED Provider Notes (Signed)
?[facility] DEPARTMENT ?Provider Note ? ? ?CSN: [id number] ?Arrival date & time: 06/25/21  2136 ? ?  ? ?History ? ?Chief Complaint  ?Patient presents with  ? Laceration  ?  Left hand, middle finger  ? ? ?TERRILL WAUTERS is a 61 y.o. female. ? ?Patient is a 61 year old female with past medical history of hyperlipidemia, hypertension.  Patient presenting with a laceration to her left middle finger.  Patient was cooking when she excellently sliced herself with a knife.  She has a laceration overlying the DIP joint.  Last tetanus unknown.  Bleeding controlled with direct pressure. ? ?The history is provided by the patient.  ? ?  ? ?Home Medications ?Prior to Admission medications   ?Medication Sig Start Date End Date Taking? Authorizing Provider  ?Adalimumab 40 MG/0.8ML PNKT Inject 40 mg into the skin. 07/08/16   [provider]  ?azaTHIOprine (IMURAN) 50 MG tablet Take 100 mg by mouth daily. Take 1 tablet by mouth daily 12/26/20   [provider]  ?brimonidine-timolol (COMBIGAN) 0.2-0.5 % ophthalmic solution Place 1 drop into both eyes 2 times daily. 01/11/21   [provider]  ?Cholecalciferol (VITAMIN D3) 2000 units capsule Take 1 capsule (2,000 Units total) by mouth daily. 12/15/16   Plotnikov, Evie Lacks, MD  ?clonazePAM [place]bye [place]) 1 MG tablet Take 1/2 to 1  Tablet by mouth two times daily as needed for nerves 12/16/19   Plotnikov, Evie Lacks, MD  ?dorzolamide-timolol (COSOPT) 22.3-6.8 MG/ML ophthalmic solution 1 drop 2 (two) times daily. 1 drop 2 (two) times daily 11/20/20   [provider]  ?folic acid (FOLVITE) 1 MG tablet TAKE 1 TABLET(1 MG) BY MOUTH DAILY 05/20/21   Plotnikov, Evie Lacks, MD  ?HYDROcodone-acetaminophen (NORCO/VICODIN) 5-325 MG tablet Take 1 tablet by mouth 3 (three) times daily as needed for severe pain. 05/21/21 05/21/22  Plotnikov, Evie Lacks, MD  ?HYDROcodone-acetaminophen (NORCO/VICODIN) 5-325 MG tablet Take 1 tablet by mouth 3 (three) times daily as  needed for severe pain. 06/22/21 06/22/22  Plotnikov, Evie Lacks, MD  ?hydrOXYzine (ATARAX/VISTARIL) 25 MG tablet TAKE 1 TABLET BY MOUTH EVERY 8 HOURS AS NEEDED FOR ITCHING 11/30/20   Plotnikov, Evie Lacks, MD  ?montelukast (SINGULAIR) 10 MG tablet TAKE 1 TABLET(10 MG) BY MOUTH DAILY 12/24/20   Plotnikov, Evie Lacks, MD  ?naproxen (NAPROSYN) 500 MG tablet TAKE 1 TABLET BY MOUTH TWICE DAILY WITH FOOD FOR MODERATE PAIN 05/03/21   Plotnikov, Evie Lacks, MD  ?pravastatin (PRAVACHOL) 20 MG tablet TAKE 1 TABLET(20 MG) BY MOUTH DAILY 04/02/21   Plotnikov, Evie Lacks, MD  ?prednisoLONE acetate (PRED FORTE) 1 % ophthalmic suspension 1 drop 4 (four) times daily. 1 drop 4 (four) times daily. 01/20/21   [provider]  ?valsartan-hydrochlorothiazide (DIOVAN-HCT) 160-12.5 MG tablet TAKE 1 TABLET BY MOUTH DAILY 10/14/20   Plotnikov, Evie Lacks, MD  ?zolpidem (AMBIEN) 10 MG tablet TAKE 1 TABLET(10 MG) BY MOUTH AT BEDTIME AS NEEDED FOR SLEEP 02/14/21   Plotnikov, Evie Lacks, MD  ?   ? ?Allergies    ?Crestor [rosuvastatin] and Oxycodone   ? ?Review of Systems   ?Review of Systems  ?All other systems reviewed and are negative. ? ?Physical Exam ?Updated Vital Signs ?BP 120/90 (BP Location: Left Arm)   Pulse 84   Temp 98 ?F (36.7 ?C) (Oral)   Resp 18   Ht '5\' 5"'$  (1.651 m)   Wt 90.7 kg   LMP 06/06/2015 Comment: irregular   SpO2 100%   BMI 33.28 kg/m?  ?  Physical Exam ?Vitals and nursing note reviewed.  ?Constitutional:   ?   Appearance: Normal appearance.  ?HENT:  ?   Head: Normocephalic and atraumatic.  ?Pulmonary:  ?   Effort: Pulmonary effort is normal.  ?Musculoskeletal:  ?   Comments: There is a 1 cm laceration overlying the extensor surface of the DIP joint of the left third finger.  There is no tendon involvement.  She is able to extend the finger without difficulty.  ?Skin: ?   General: Skin is warm and dry.  ?Neurological:  ?   Mental Status: She is alert.  ? ? ?ED Results / Procedures / Treatments   ?Labs ?(all labs ordered  are listed, but only abnormal results are displayed) ?Labs Reviewed - No data to display ? ?EKG ?None ? ?Radiology ?No results found. ? ?Procedures ?Procedures  ? ? ?Medications Ordered in ED ?Medications - No data to display ? ?ED Course/ Medical Decision Making/ A&P ? ?Laceration repaired with Dermabond.  Patient to be discharged with follow-up as needed. ? ?Final Clinical Impression(s) / ED Diagnoses ?Final diagnoses:  ?None  ? ? ?Rx / DC Orders ?ED Discharge Orders   ? ? None  ? ?  ? ? ?  ?Veryl Speak, MD ?06/25/21 2315 ? ?

## 2021-06-25 NOTE — ED Notes (Signed)
Wound was wrapped in guaze. Patient was given wound care instructions.  ?

## 2021-06-25 NOTE — ED Triage Notes (Signed)
Patient was cooking and accidentally cut her finger with a knife. Last tetanus shot unknown.  ?

## 2021-07-23 ENCOUNTER — Telehealth: Payer: Self-pay

## 2021-07-23 DIAGNOSIS — M159 Polyosteoarthritis, unspecified: Secondary | ICD-10-CM

## 2021-07-23 NOTE — Telephone Encounter (Signed)
Pt requesting a refill on: ?HYDROcodone-acetaminophen (NORCO/VICODIN) 5-325 MG tablet ? ?Pharmacy: ?[id number] - [place], [place] - [address] AT [place] [address] ? ?LOV 05/21/21 ?ROV 08/27/21 ?

## 2021-07-23 NOTE — Telephone Encounter (Signed)
Check [place] registry last filled 06/23/2021../lmb  

## 2021-07-24 MED ORDER — HYDROCODONE-ACETAMINOPHEN 5-325 MG PO TABS: 1.0000 | ORAL_TABLET | Freq: Three times a day (TID) | ORAL | 0 refills | Status: DC | PRN

## 2021-07-24 NOTE — Telephone Encounter (Signed)
Okay.  Thanks.

## 2021-07-25 ENCOUNTER — Telehealth: Payer: Self-pay | Admitting: Internal Medicine

## 2021-07-25 DIAGNOSIS — M159 Polyosteoarthritis, unspecified: Secondary | ICD-10-CM

## 2021-07-25 NOTE — Telephone Encounter (Signed)
[facility] not [facility] HYDROcodone-acetaminophen (NORCO/VICODIN) 5-325 MG tablet in stock  ? ?Pt requesting rx sent to [id number] - [place], [place] - [address] AT [place] [address] ?

## 2021-07-25 NOTE — Telephone Encounter (Signed)
MD sent renewal to pof.Marland KitchenJohny Chess ?

## 2021-07-26 MED ORDER — HYDROCODONE-ACETAMINOPHEN 5-325 MG PO TABS: 1.0000 | ORAL_TABLET | Freq: Three times a day (TID) | ORAL | 0 refills | Status: DC | PRN

## 2021-07-26 NOTE — Telephone Encounter (Signed)
Ok Thx 

## 2021-08-20 ENCOUNTER — Ambulatory Visit: Payer: BC Managed Care – PPO | Admitting: Internal Medicine

## 2021-08-27 ENCOUNTER — Ambulatory Visit: Payer: BC Managed Care – PPO | Admitting: Internal Medicine

## 2021-08-27 ENCOUNTER — Encounter: Payer: Self-pay | Admitting: Internal Medicine

## 2021-08-27 DIAGNOSIS — F419 Anxiety disorder, unspecified: Secondary | ICD-10-CM | POA: Diagnosis not present

## 2021-08-27 DIAGNOSIS — H35063 Retinal vasculitis, bilateral: Secondary | ICD-10-CM | POA: Diagnosis not present

## 2021-08-27 DIAGNOSIS — R21 Rash and other nonspecific skin eruption: Secondary | ICD-10-CM | POA: Insufficient documentation

## 2021-08-27 DIAGNOSIS — M79606 Pain in leg, unspecified: Secondary | ICD-10-CM

## 2021-08-27 DIAGNOSIS — M545 Low back pain, unspecified: Secondary | ICD-10-CM

## 2021-08-27 DIAGNOSIS — M159 Polyosteoarthritis, unspecified: Secondary | ICD-10-CM

## 2021-08-27 MED ORDER — PREDNISONE 10 MG PO TABS: ORAL_TABLET | ORAL | 1 refills | Status: DC

## 2021-08-27 MED ORDER — HYDROCODONE-ACETAMINOPHEN 5-325 MG PO TABS: 1.0000 | ORAL_TABLET | Freq: Three times a day (TID) | ORAL | 0 refills | Status: DC | PRN

## 2021-08-27 MED ORDER — TRIAMCINOLONE ACETONIDE 0.1 % EX CREA: 1.0000 "application " | TOPICAL_CREAM | Freq: Four times a day (QID) | CUTANEOUS | 3 refills | Status: DC

## 2021-08-27 NOTE — Assessment & Plan Note (Signed)
Cont w/Norco prn  Potential benefits of a long term opioids use as well as potential risks (i.e. addiction risk, apnea etc) and complications (i.e. Somnolence, constipation and others) were explained to the patient and were aknowledged. 

## 2021-08-27 NOTE — Progress Notes (Signed)
Subjective:  Patient ID: Jessica Lynn, female    DOB: [birth year]  Age: 61 y.o. MRN: [id number]  CC: Follow-up   HPI Jessica Lynn presents for rash all over - itchy x 2 wks - getting worse, spreading. F/u on LBP, anxiety       Outpatient Medications Prior to Visit  Medication Sig Dispense Refill   Adalimumab 40 MG/0.8ML PNKT Inject 40 mg into the skin.     azaTHIOprine (IMURAN) 50 MG tablet Take 100 mg by mouth daily. Take 1 tablet by mouth daily     brimonidine-timolol (COMBIGAN) 0.2-0.5 % ophthalmic solution Place 1 drop into both eyes 2 times daily.     Cholecalciferol (VITAMIN D3) 2000 units capsule Take 1 capsule (2,000 Units total) by mouth daily. 100 capsule 3   clonazePAM (KLONOPIN) 1 MG tablet Take 1/2 to 1  Tablet by mouth two times daily as needed for nerves 60 tablet 3   dorzolamide-timolol (COSOPT) 22.3-6.8 MG/ML ophthalmic solution 1 drop 2 (two) times daily. 1 drop 2 (two) times daily     folic acid (FOLVITE) 1 MG tablet TAKE 1 TABLET(1 MG) BY MOUTH DAILY 100 tablet 3   HYDROcodone-acetaminophen (NORCO/VICODIN) 5-325 MG tablet Take 1 tablet by mouth 3 (three) times daily as needed for severe pain. 90 tablet 0   hydrOXYzine (ATARAX/VISTARIL) 25 MG tablet TAKE 1 TABLET BY MOUTH EVERY 8 HOURS AS NEEDED FOR ITCHING 270 tablet 1   montelukast (SINGULAIR) 10 MG tablet TAKE 1 TABLET(10 MG) BY MOUTH DAILY 90 tablet 3   naproxen (NAPROSYN) 500 MG tablet TAKE 1 TABLET BY MOUTH TWICE DAILY WITH FOOD FOR MODERATE PAIN 60 tablet 3   pravastatin (PRAVACHOL) 20 MG tablet TAKE 1 TABLET(20 MG) BY MOUTH DAILY 90 tablet 3   prednisoLONE acetate (PRED FORTE) 1 % ophthalmic suspension 1 drop 4 (four) times daily. 1 drop 4 (four) times daily.     valsartan-hydrochlorothiazide (DIOVAN-HCT) 160-12.5 MG tablet TAKE 1 TABLET BY MOUTH DAILY 30 tablet 11   zolpidem (AMBIEN) 10 MG tablet TAKE 1 TABLET(10 MG) BY MOUTH AT BEDTIME AS NEEDED FOR SLEEP 30 tablet 3   HYDROcodone-acetaminophen  (NORCO/VICODIN) 5-325 MG tablet Take 1 tablet by mouth 3 (three) times daily as needed for severe pain. 90 tablet 0   No facility-administered medications prior to visit.    ROS: Review of Systems  Constitutional:  Negative for activity change, appetite change, chills, fatigue and unexpected weight change.  HENT:  Negative for congestion, mouth sores and sinus pressure.   Eyes:  Positive for visual disturbance.  Respiratory:  Negative for cough and chest tightness.   Gastrointestinal:  Negative for abdominal pain and nausea.  Genitourinary:  Negative for difficulty urinating, frequency and vaginal pain.  Musculoskeletal:  Positive for arthralgias and back pain. Negative for gait problem.  Skin:  Positive for rash. Negative for pallor.  Neurological:  Negative for dizziness, tremors, weakness, numbness and headaches.  Psychiatric/Behavioral:  Negative for confusion, sleep disturbance and suicidal ideas. The patient is nervous/anxious.    Objective:  BP 124/76 (BP Location: Left Arm, Patient Position: Sitting, Cuff Size: Normal)   Pulse 88   Temp 98.5 F (36.9 C) (Oral)   Ht '5\' 5"'$  (1.651 m)   Wt 188 lb 6.4 oz (85.5 kg)   LMP 06/06/2015 Comment: irregular   SpO2 99%   BMI 31.35 kg/m   BP Readings from Last 3 Encounters:  08/27/21 124/76  06/25/21 120/90  05/21/21 130/76    Wt Readings  from Last 3 Encounters:  08/27/21 188 lb 6.4 oz (85.5 kg)  06/25/21 200 lb (90.7 kg)  05/21/21 209 lb (94.8 kg)    Physical Exam Constitutional:      General: She is not in acute distress.    Appearance: She is well-developed. She is obese.  HENT:     Head: Normocephalic.     Right Ear: External ear normal.     Left Ear: External ear normal.     Nose: Nose normal.  Eyes:     General:        Right eye: No discharge.        Left eye: No discharge.     Conjunctiva/sclera: Conjunctivae normal.     Pupils: Pupils are equal, round, and reactive to light.  Neck:     Thyroid: No  thyromegaly.     Vascular: No JVD.     Trachea: No tracheal deviation.  Cardiovascular:     Rate and Rhythm: Normal rate and regular rhythm.     Heart sounds: Normal heart sounds.  Pulmonary:     Effort: No respiratory distress.     Breath sounds: No stridor. No wheezing.  Abdominal:     General: Bowel sounds are normal. There is no distension.     Palpations: Abdomen is soft. There is no mass.     Tenderness: There is no abdominal tenderness. There is no guarding or rebound.  Musculoskeletal:        General: Tenderness present.     Cervical back: Normal range of motion and neck supple. No rigidity.  Lymphadenopathy:     Cervical: No cervical adenopathy.  Skin:    Findings: Rash present. No erythema.  Neurological:     Mental Status: She is oriented to person, place, and time.     Cranial Nerves: No cranial nerve deficit.     Motor: No abnormal muscle tone.     Coordination: Coordination normal.     Gait: Gait abnormal.     Deep Tendon Reflexes: Reflexes normal.  Psychiatric:        Behavior: Behavior normal.        Thought Content: Thought content normal.        Judgment: Judgment normal.  Extensive papular rash - trunk and all extremities  Lab Results  Component Value Date   WBC 7.2 12/16/2019   HGB 11.6 (L) 12/16/2019   HCT 34.4 (L) 12/16/2019   PLT 262 12/16/2019   GLUCOSE 107 (H) 02/08/2021   CHOL 148 12/16/2019   TRIG 71 12/16/2019   HDL 70 12/16/2019   LDLDIRECT 160.7 02/06/2012   LDLCALC 63 12/16/2019   ALT 61 (H) 02/08/2021   AST 40 (H) 02/08/2021   NA 139 02/08/2021   K 3.8 02/08/2021   CL 102 02/08/2021   CREATININE 0.85 02/08/2021   BUN 14 02/08/2021   CO2 30 02/08/2021   TSH 0.93 12/16/2019   HGBA1C 5.8 (H) 12/16/2019    No results found.  Assessment & Plan:   Problem List Items Addressed This Visit     Primary osteoarthritis involving multiple joints   Relevant Medications   predniSONE (DELTASONE) 10 MG tablet   HYDROcodone-acetaminophen  (NORCO/VICODIN) 5-325 MG tablet   Low back pain radiating down leg    Cont w/Norco prn  Potential benefits of a long term opioids use as well as potential risks (i.e. addiction risk, apnea etc) and complications (i.e. Somnolence, constipation and others) were explained to the patient and were aknowledged.  Relevant Medications   predniSONE (DELTASONE) 10 MG tablet   HYDROcodone-acetaminophen (NORCO/VICODIN) 5-325 MG tablet   Anxiety disorder     Cont w/Clonazepam prn  Potential benefits of a long term benzodiazepines  use as well as potential risks  and complications were explained to the patient and were aknowledged. Not to take w/Norco      Retinal vasculitis    Pt stopped oral meds due to rash a week ago       Rash and nonspecific skin eruption    New Pt stopped oral vasculitis meds due to rash a week ago Start Prednisone Oral cream Derm ref/skin bx if not resolved          Meds ordered this encounter  Medications   triamcinolone cream (KENALOG) 0.1 %    Sig: Apply 1 application. topically 4 (four) times daily.    Dispense:  450 g    Refill:  3   predniSONE (DELTASONE) 10 MG tablet    Sig: Prednisone 10 mg: take 4 tabs a day x 3 days; then 3 tabs a day x 4 days; then 2 tabs a day x 4 days, then 1 tab a day x 6 days, then stop. Take pc.    Dispense:  38 tablet    Refill:  1   HYDROcodone-acetaminophen (NORCO/VICODIN) 5-325 MG tablet    Sig: Take 1 tablet by mouth 3 (three) times daily as needed for severe pain.    Dispense:  90 tablet    Refill:  0    Please fill on or after 08/27/21.  Office visit every 3 months      Follow-up: Return in about 4 weeks (around 09/24/2021) for a follow-up visit.  Walker Kehr, MD

## 2021-08-27 NOTE — Assessment & Plan Note (Signed)
New Pt stopped oral vasculitis meds due to rash a week ago Start Prednisone Oral cream Derm ref/skin bx if not resolved

## 2021-08-27 NOTE — Assessment & Plan Note (Signed)
  Cont w/Clonazepam prn  Potential benefits of a long term benzodiazepines  use as well as potential risks  and complications were explained to the patient and were aknowledged. Not to take w/Norco

## 2021-08-27 NOTE — Assessment & Plan Note (Signed)
Pt stopped oral meds due to rash a week ago

## 2021-08-30 ENCOUNTER — Other Ambulatory Visit: Payer: Self-pay | Admitting: Internal Medicine

## 2021-09-03 NOTE — Telephone Encounter (Signed)
Check [place] registry last filled 08/12/2021.Marland KitchenJohny Lynn

## 2021-09-16 ENCOUNTER — Ambulatory Visit: Payer: BC Managed Care – PPO | Admitting: Internal Medicine

## 2021-09-16 ENCOUNTER — Encounter: Payer: Self-pay | Admitting: Internal Medicine

## 2021-09-16 VITALS — BP 122/70 | HR 72 | Temp 98.0°F | Ht 65.0 in | Wt 186.0 lb

## 2021-09-16 DIAGNOSIS — R609 Edema, unspecified: Secondary | ICD-10-CM | POA: Diagnosis not present

## 2021-09-16 DIAGNOSIS — L959 Vasculitis limited to the skin, unspecified: Secondary | ICD-10-CM

## 2021-09-16 DIAGNOSIS — R21 Rash and other nonspecific skin eruption: Secondary | ICD-10-CM | POA: Diagnosis not present

## 2021-09-16 DIAGNOSIS — R739 Hyperglycemia, unspecified: Secondary | ICD-10-CM

## 2021-09-16 DIAGNOSIS — M79606 Pain in leg, unspecified: Secondary | ICD-10-CM

## 2021-09-16 DIAGNOSIS — M545 Low back pain, unspecified: Secondary | ICD-10-CM

## 2021-09-16 LAB — COMPREHENSIVE METABOLIC PANEL
ALT: 33 U/L (ref 0–35)
AST: 23 U/L (ref 0–37)
Albumin: 3.3 g/dL — ABNORMAL LOW (ref 3.5–5.2)
Alkaline Phosphatase: 74 U/L (ref 39–117)
BUN: 18 mg/dL (ref 6–23)
CO2: 29 mEq/L (ref 19–32)
Calcium: 9.1 mg/dL (ref 8.4–10.5)
Chloride: 102 mEq/L (ref 96–112)
Creatinine, Ser: 0.94 mg/dL (ref 0.40–1.20)
GFR: 66.37 mL/min (ref >60.00)
Glucose, Bld: 88 mg/dL (ref 70–99)
Potassium: 3.8 mEq/L (ref 3.5–5.1)
Sodium: 138 mEq/L (ref 135–145)
Total Bilirubin: 1.1 mg/dL (ref 0.2–1.2)
Total Protein: 6.4 g/dL (ref 6.0–8.3)

## 2021-09-16 LAB — URIC ACID: Uric Acid, Serum: 8.3 mg/dL — ABNORMAL HIGH (ref 2.4–7.0)

## 2021-09-16 LAB — CBC WITH DIFFERENTIAL/PLATELET
Basophils Absolute: 0.1 10*3/uL (ref 0.0–0.1)
Basophils Relative: 0.7 % (ref 0.0–3.0)
Eosinophils Absolute: 0.2 10*3/uL (ref 0.0–0.7)
Eosinophils Relative: 1.6 % (ref 0.0–5.0)
HCT: 27.2 % — ABNORMAL LOW (ref 36.0–46.0)
Hemoglobin: 9.5 g/dL — ABNORMAL LOW (ref 12.0–15.0)
Lymphocytes Relative: 23.7 % (ref 12.0–46.0)
Lymphs Abs: 2.5 10*3/uL (ref 0.7–4.0)
MCHC: 34.9 g/dL (ref 30.0–36.0)
MCV: 105.9 fl — ABNORMAL HIGH (ref 78.0–100.0)
Monocytes Absolute: 1.1 10*3/uL — ABNORMAL HIGH (ref 0.1–1.0)
Monocytes Relative: 10.8 % (ref 3.0–12.0)
Neutro Abs: 6.6 10*3/uL (ref 1.4–7.7)
Neutrophils Relative %: 63.2 % (ref 43.0–77.0)
Platelets: 161 10*3/uL (ref 150.0–400.0)
RBC: 2.57 Mil/uL — ABNORMAL LOW (ref 3.87–5.11)
RDW: 16.3 % — ABNORMAL HIGH (ref 11.5–15.5)
WBC: 10.4 10*3/uL (ref 4.0–10.5)

## 2021-09-16 LAB — TSH: TSH: 1.65 u[IU]/mL (ref 0.35–5.50)

## 2021-09-16 LAB — HEMOGLOBIN A1C: Hgb A1c MFr Bld: 5.5 % (ref 4.6–6.5)

## 2021-09-16 LAB — SEDIMENTATION RATE: Sed Rate: 22 mm/hr (ref 0–30)

## 2021-09-16 MED ORDER — PREDNISONE 10 MG PO TABS: ORAL_TABLET | ORAL | 1 refills | Status: DC

## 2021-09-16 MED ORDER — PANTOPRAZOLE SODIUM 40 MG PO TBEC: 40.0000 mg | DELAYED_RELEASE_TABLET | Freq: Every day | ORAL | 5 refills | Status: DC

## 2021-09-16 MED ORDER — FUROSEMIDE 40 MG PO TABS: 40.0000 mg | ORAL_TABLET | Freq: Every day | ORAL | 5 refills | Status: DC

## 2021-09-16 MED ORDER — POTASSIUM CHLORIDE CRYS ER 20 MEQ PO TBCR: 20.0000 meq | EXTENDED_RELEASE_TABLET | Freq: Every day | ORAL | 3 refills | Status: DC

## 2021-09-16 NOTE — Assessment & Plan Note (Signed)
Worse Start Furosemide and K dur daily Monitor CMET

## 2021-09-16 NOTE — Assessment & Plan Note (Signed)
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

## 2021-09-16 NOTE — Assessment & Plan Note (Addendum)
New. Multiple lesions - see photos Labs w/rheumatoid tests Start a high dose steroid - Deltasone 60 mg/d w/Protonix RTC in 1 week

## 2021-09-16 NOTE — Progress Notes (Signed)
Subjective:  Patient ID: Jessica Lynn, female    DOB: 08-21-1960  Age: 61 y.o. MRN: [id number]  CC: No chief complaint on file.   HPI Jessica Lynn presents for rash on chest - resolved on steroids. 2 days after Thayer Headings saw mw  - she developed severe pain in the L groin. Then knots started to develop later. C/o swelling of B LEs L>>R. Lesions ae oozing liquid and blood. Pt stopped her eye vasculitis eds a few wks ago         Outpatient Medications Prior to Visit  Medication Sig Dispense Refill   brimonidine-timolol (COMBIGAN) 0.2-0.5 % ophthalmic solution Place 1 drop into both eyes 2 times daily.     Cholecalciferol (VITAMIN D3) 2000 units capsule Take 1 capsule (2,000 Units total) by mouth daily. 100 capsule 3   clonazePAM (KLONOPIN) 1 MG tablet Take 1/2 to 1  Tablet by mouth two times daily as needed for nerves 60 tablet 3   dorzolamide-timolol (COSOPT) 22.3-6.8 MG/ML ophthalmic solution 1 drop 2 (two) times daily. 1 drop 2 (two) times daily     folic acid (FOLVITE) 1 MG tablet TAKE 1 TABLET(1 MG) BY MOUTH DAILY 100 tablet 3   HYDROcodone-acetaminophen (NORCO/VICODIN) 5-325 MG tablet Take 1 tablet by mouth 3 (three) times daily as needed for severe pain. 90 tablet 0   HYDROcodone-acetaminophen (NORCO/VICODIN) 5-325 MG tablet Take 1 tablet by mouth 3 (three) times daily as needed for severe pain. 90 tablet 0   hydrOXYzine (ATARAX/VISTARIL) 25 MG tablet TAKE 1 TABLET BY MOUTH EVERY 8 HOURS AS NEEDED FOR ITCHING 270 tablet 1   montelukast (SINGULAIR) 10 MG tablet TAKE 1 TABLET(10 MG) BY MOUTH DAILY 90 tablet 3   pravastatin (PRAVACHOL) 20 MG tablet TAKE 1 TABLET(20 MG) BY MOUTH DAILY 90 tablet 3   prednisoLONE acetate (PRED FORTE) 1 % ophthalmic suspension 1 drop 4 (four) times daily. 1 drop 4 (four) times daily.     triamcinolone cream (KENALOG) 0.1 % Apply 1 application. topically 4 (four) times daily. 450 g 3   valsartan-hydrochlorothiazide (DIOVAN-HCT) 160-12.5 MG tablet TAKE 1  TABLET BY MOUTH DAILY 30 tablet 11   zolpidem (AMBIEN) 10 MG tablet TAKE 1 TABLET(10 MG) BY MOUTH AT BEDTIME AS NEEDED FOR SLEEP 30 tablet 2   Adalimumab 40 MG/0.8ML PNKT Inject 40 mg into the skin.     azaTHIOprine (IMURAN) 50 MG tablet Take 100 mg by mouth daily. Take 1 tablet by mouth daily     naproxen (NAPROSYN) 500 MG tablet TAKE 1 TABLET BY MOUTH TWICE DAILY WITH FOOD FOR MODERATE PAIN 60 tablet 3   predniSONE (DELTASONE) 10 MG tablet Prednisone 10 mg: take 4 tabs a day x 3 days; then 3 tabs a day x 4 days; then 2 tabs a day x 4 days, then 1 tab a day x 6 days, then stop. Take pc. 38 tablet 1   No facility-administered medications prior to visit.    ROS: Review of Systems  Constitutional:  Negative for activity change, appetite change, chills, fatigue and unexpected weight change.  HENT:  Negative for congestion, mouth sores and sinus pressure.   Eyes:  Negative for visual disturbance.  Respiratory:  Negative for cough and chest tightness.   Gastrointestinal:  Negative for abdominal pain and nausea.  Genitourinary:  Negative for difficulty urinating, frequency and vaginal pain.  Musculoskeletal:  Positive for back pain. Negative for gait problem.  Skin:  Positive for color change, rash and wound. Negative  for pallor.  Neurological:  Negative for dizziness, tremors, weakness, numbness and headaches.  Hematological:  Negative for adenopathy. Does not bruise/bleed easily.  Psychiatric/Behavioral:  Negative for confusion and sleep disturbance.     Objective:  BP 122/70 (BP Location: Right Arm, Patient Position: Sitting, Cuff Size: Normal)   Pulse 72   Temp 98 F (36.7 C) (Oral)   Ht '5\' 5"'$  (1.651 m)   Wt 186 lb (84.4 kg)   LMP 06/06/2015 Comment: irregular   SpO2 99%   BMI 30.95 kg/m   BP Readings from Last 3 Encounters:  09/16/21 122/70  08/27/21 124/76  06/25/21 120/90    Wt Readings from Last 3 Encounters:  09/16/21 186 lb (84.4 kg)  08/27/21 188 lb 6.4 oz (85.5 kg)   06/25/21 200 lb (90.7 kg)    Physical Exam Constitutional:      General: She is not in acute distress.    Appearance: She is well-developed.  HENT:     Head: Normocephalic.     Right Ear: External ear normal.     Left Ear: External ear normal.     Nose: Nose normal.  Eyes:     General:        Right eye: No discharge.        Left eye: No discharge.     Conjunctiva/sclera: Conjunctivae normal.     Pupils: Pupils are equal, round, and reactive to light.  Neck:     Thyroid: No thyromegaly.     Vascular: No JVD.     Trachea: No tracheal deviation.  Cardiovascular:     Rate and Rhythm: Normal rate and regular rhythm.     Heart sounds: Normal heart sounds.  Pulmonary:     Effort: No respiratory distress.     Breath sounds: No stridor. No wheezing.  Abdominal:     General: Bowel sounds are normal. There is no distension.     Palpations: Abdomen is soft. There is no mass.     Tenderness: There is no abdominal tenderness. There is no guarding or rebound.  Musculoskeletal:        General: No tenderness.     Cervical back: Normal range of motion and neck supple. No rigidity.     Right lower leg: Edema present.     Left lower leg: Edema present.  Lymphadenopathy:     Cervical: No cervical adenopathy.  Skin:    Findings: Erythema and lesion present. No rash.  Neurological:     Mental Status: She is oriented to person, place, and time.     Cranial Nerves: No cranial nerve deficit.     Motor: No abnormal muscle tone.     Coordination: Coordination normal.     Deep Tendon Reflexes: Reflexes normal.  Psychiatric:        Behavior: Behavior normal.        Thought Content: Thought content normal.        Judgment: Judgment normal.   No HSM Edema 1+ B  Lab Results  Component Value Date   WBC 7.2 12/16/2019   HGB 11.6 (L) 12/16/2019   HCT 34.4 (L) 12/16/2019   PLT 262 12/16/2019   GLUCOSE 107 (H) 02/08/2021   CHOL 148 12/16/2019   TRIG 71 12/16/2019   HDL 70 12/16/2019    LDLDIRECT 160.7 02/06/2012   LDLCALC 63 12/16/2019   ALT 61 (H) 02/08/2021   AST 40 (H) 02/08/2021   NA 139 02/08/2021   K 3.8 02/08/2021   CL  102 02/08/2021   CREATININE 0.85 02/08/2021   BUN 14 02/08/2021   CO2 30 02/08/2021   TSH 0.93 12/16/2019   HGBA1C 5.8 (H) 12/16/2019    No results found.  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 Multiple lesions - see photos Labs w/rheumatoid tests Start a high dose steroid - Deltasone 60 mg/d RTC in 1 week      Relevant Medications   furosemide (LASIX) 40 MG tablet   Other Relevant Orders   CBC with Differential/Platelet   Comprehensive metabolic panel   TSH   Uric acid   ANA   Rheumatoid factor   Sedimentation rate   ANCA Profile   Ambulatory referral to Dermatology   Other Visit Diagnoses     Hyperglycemia       Relevant Orders   Hemoglobin A1c         Meds ordered this encounter  Medications   predniSONE (DELTASONE) 10 MG tablet    Sig: Take 60 mg daily with food until further instructions    Dispense:  180 tablet    Refill:  1   furosemide (LASIX) 40 MG tablet    Sig: Take 1 tablet (40 mg total) by mouth daily.    Dispense:  30 tablet    Refill:  5   potassium chloride SA (KLOR-CON M) 20 MEQ tablet    Sig: Take 1 tablet (20 mEq total) by mouth daily.    Dispense:  30 tablet    Refill:  3      Follow-up: Return in about 1 week (around 09/23/2021) for a follow-up visit.  Walker Kehr, MD

## 2021-09-17 ENCOUNTER — Other Ambulatory Visit: Payer: Self-pay | Admitting: Internal Medicine

## 2021-09-17 ENCOUNTER — Other Ambulatory Visit: Payer: Self-pay | Admitting: *Deleted

## 2021-09-17 LAB — RHEUMATOID FACTOR: Rheumatoid fact SerPl-aCnc: <14 IU/mL (ref <14)

## 2021-09-17 NOTE — Telephone Encounter (Signed)
Rec'd fax for PA on Zolpidem 10 mg. Submitted via cover-my-meds w/ (Key: [id number]) Rec'd msg stattimg  " Your Demographic data has been sent to [facility] successfully! [employer] typically takes 5-10 minutes to respond, but it may take a little longer in some cases. You will be notified by email when available. '

## 2021-09-18 LAB — PMP SCREEN PROFILE (10S), URINE
Amphetamine Scrn, Ur: NEGATIVE ng/mL
BARBITURATE SCREEN URINE: NEGATIVE ng/mL
BENZODIAZEPINE SCREEN, URINE: NEGATIVE ng/mL
CANNABINOIDS UR QL SCN: NEGATIVE ng/mL
Cocaine (Metab) Scrn, Ur: NEGATIVE ng/mL
Creatinine(Crt), U: 38.2 mg/dL (ref 20.0–300.0)
Methadone Screen, Urine: NEGATIVE ng/mL
OXYCODONE+OXYMORPHONE UR QL SCN: NEGATIVE ng/mL
Opiate Scrn, Ur: POSITIVE ng/mL — AB
Ph of Urine: 5.7 (ref 4.5–8.9)
Phencyclidine Qn, Ur: NEGATIVE ng/mL
Propoxyphene Scrn, Ur: NEGATIVE ng/mL

## 2021-09-18 LAB — ANCA PROFILE
Anti-MPO Antibodies: <0.2 units (ref 0.0–0.9)
Anti-PR3 Antibodies: <0.2 units (ref 0.0–0.9)
Atypical pANCA: <1:20 {titer}
C-ANCA: <1:20 {titer}
P-ANCA: <1:20 {titer}

## 2021-09-18 NOTE — Telephone Encounter (Signed)
Rec'd determination fax MED been APPROVED. Effective 09/18/21 - 09-18-24. Faxed approval to pof.Marland KitchenJohny Chess

## 2021-09-18 NOTE — Addendum Note (Signed)
Addended by: Earnstine Regal on: 09/18/2021 04:34 PM   Modules accepted: Orders

## 2021-09-18 NOTE — Telephone Encounter (Signed)
Pt called in stating this medication  [id number]  [phone] Fax:  [phone]    [facility] would [facility] the [facility]  [id number] [place] [place]:  [phone]:  [phone]

## 2021-09-24 ENCOUNTER — Ambulatory Visit: Payer: BC Managed Care – PPO | Admitting: Internal Medicine

## 2021-09-26 ENCOUNTER — Telehealth: Payer: Self-pay | Admitting: Internal Medicine

## 2021-09-26 ENCOUNTER — Encounter: Payer: Self-pay | Admitting: Internal Medicine

## 2021-09-26 ENCOUNTER — Ambulatory Visit: Payer: BC Managed Care – PPO | Admitting: Internal Medicine

## 2021-09-26 VITALS — BP 118/74 | HR 78 | Temp 97.8°F | Ht 65.0 in | Wt 175.0 lb

## 2021-09-26 DIAGNOSIS — M545 Low back pain, unspecified: Secondary | ICD-10-CM

## 2021-09-26 DIAGNOSIS — H35063 Retinal vasculitis, bilateral: Secondary | ICD-10-CM

## 2021-09-26 DIAGNOSIS — L959 Vasculitis limited to the skin, unspecified: Secondary | ICD-10-CM

## 2021-09-26 DIAGNOSIS — M159 Polyosteoarthritis, unspecified: Secondary | ICD-10-CM

## 2021-09-26 DIAGNOSIS — M79606 Pain in leg, unspecified: Secondary | ICD-10-CM

## 2021-09-26 DIAGNOSIS — M150 Primary generalized (osteo)arthritis: Secondary | ICD-10-CM

## 2021-09-26 MED ORDER — HYDROCODONE-ACETAMINOPHEN 5-325 MG PO TABS: 1.0000 | ORAL_TABLET | Freq: Three times a day (TID) | ORAL | 0 refills | Status: DC | PRN

## 2021-09-26 MED ORDER — PREDNISONE 10 MG PO TABS: ORAL_TABLET | ORAL | 1 refills | Status: DC

## 2021-09-26 MED ORDER — ZOLPIDEM TARTRATE 10 MG PO TABS
10.0000 mg | ORAL_TABLET | Freq: Every evening | ORAL | 2 refills | Status: DC | PRN
Start: 2021-09-26 — End: 2021-11-14

## 2021-09-26 NOTE — Assessment & Plan Note (Signed)
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  Derm appt - August 2023, on cancellation list. Off MTX and Humira. ANA was not done Better on 60 mg/d Prednisone. Will reduce to 50 mg/d Rheum referral

## 2021-09-26 NOTE — Assessment & Plan Note (Signed)
Since 2017. Now w/skin vasculitis - 09/2021.  Derm appt - August 2023, on cancellation list. Off MTX and Humira. ANA was not done Better on 60 mg/d Prednisone. Will reduce to 50 mg/d Rheum referral

## 2021-09-26 NOTE — Progress Notes (Addendum)
Subjective:  Patient ID: Jessica Lynn, female    DOB: 1961/02/20  Age: 61 y.o. MRN: [id number]  CC: No chief complaint on file.   HPI Jessica Lynn presents for severe skin vasculitis - better  Outpatient Medications Prior to Visit  Medication Sig Dispense Refill   brimonidine-timolol (COMBIGAN) 0.2-0.5 % ophthalmic solution Place 1 drop into both eyes 2 times daily.     Cholecalciferol (VITAMIN D3) 2000 units capsule Take 1 capsule (2,000 Units total) by mouth daily. 100 capsule 3   clonazePAM (KLONOPIN) 1 MG tablet Take 1/2 to 1  Tablet by mouth two times daily as needed for nerves 60 tablet 3   dorzolamide-timolol (COSOPT) 22.3-6.8 MG/ML ophthalmic solution 1 drop 2 (two) times daily. 1 drop 2 (two) times daily     folic acid (FOLVITE) 1 MG tablet TAKE 1 TABLET(1 MG) BY MOUTH DAILY 100 tablet 3   furosemide (LASIX) 40 MG tablet Take 1 tablet (40 mg total) by mouth daily. 30 tablet 5   HYDROcodone-acetaminophen (NORCO/VICODIN) 5-325 MG tablet Take 1 tablet by mouth 3 (three) times daily as needed for severe pain. 90 tablet 0   hydrOXYzine (ATARAX/VISTARIL) 25 MG tablet TAKE 1 TABLET BY MOUTH EVERY 8 HOURS AS NEEDED FOR ITCHING 270 tablet 1   montelukast (SINGULAIR) 10 MG tablet TAKE 1 TABLET(10 MG) BY MOUTH DAILY 90 tablet 3   naproxen (NAPROSYN) 500 MG tablet Take 500 mg by mouth 2 (two) times daily.     pantoprazole (PROTONIX) 40 MG tablet Take 1 tablet (40 mg total) by mouth daily. 30 tablet 5   potassium chloride SA (KLOR-CON M) 20 MEQ tablet Take 1 tablet (20 mEq total) by mouth daily. 30 tablet 3   pravastatin (PRAVACHOL) 20 MG tablet TAKE 1 TABLET(20 MG) BY MOUTH DAILY 90 tablet 3   prednisoLONE acetate (PRED FORTE) 1 % ophthalmic suspension 1 drop 4 (four) times daily. 1 drop 4 (four) times daily.     triamcinolone cream (KENALOG) 0.1 % Apply 1 application. topically 4 (four) times daily. 450 g 3   valsartan-hydrochlorothiazide (DIOVAN-HCT) 160-12.5 MG tablet TAKE 1 TABLET BY  MOUTH DAILY 30 tablet 11   zolpidem (AMBIEN) 10 MG tablet Take 1 tablet (10 mg total) by mouth at bedtime as needed for sleep. 30 tablet 2   HYDROcodone-acetaminophen (NORCO/VICODIN) 5-325 MG tablet Take 1 tablet by mouth 3 (three) times daily as needed for severe pain. 90 tablet 0   predniSONE (DELTASONE) 10 MG tablet Take 60 mg daily with food until further instructions 180 tablet 1   No facility-administered medications prior to visit.    ROS: Review of Systems  Constitutional:  Positive for unexpected weight change. Negative for activity change, appetite change, chills and fatigue.  HENT:  Negative for congestion, mouth sores and sinus pressure.   Eyes:  Negative for visual disturbance.  Respiratory:  Negative for cough and chest tightness.   Gastrointestinal:  Negative for abdominal pain and nausea.  Genitourinary:  Negative for difficulty urinating, frequency and vaginal pain.  Musculoskeletal:  Positive for arthralgias, back pain and gait problem.  Skin:  Positive for color change, rash and wound. Negative for pallor.  Neurological:  Negative for dizziness, tremors, weakness, numbness and headaches.  Psychiatric/Behavioral:  Negative for confusion and sleep disturbance.     Objective:  BP 118/74 (BP Location: Left Arm, Patient Position: Sitting, Cuff Size: Normal)   Pulse 78   Temp 97.8 F (36.6 C) (Oral)   Ht '5\' 5"'$  (1.651  m)   Wt 175 lb (79.4 kg)   LMP 06/06/2015 Comment: irregular   SpO2 98%   BMI 29.12 kg/m   BP Readings from Last 3 Encounters:  09/26/21 118/74  09/16/21 122/70  08/27/21 124/76    Wt Readings from Last 3 Encounters:  09/26/21 175 lb (79.4 kg)  09/16/21 186 lb (84.4 kg)  08/27/21 188 lb 6.4 oz (85.5 kg)    Physical Exam Constitutional:      General: She is not in acute distress.    Appearance: She is well-developed.  HENT:     Head: Normocephalic.     Right Ear: External ear normal.     Left Ear: External ear normal.     Nose: Nose  normal.  Eyes:     General:        Right eye: No discharge.        Left eye: No discharge.     Conjunctiva/sclera: Conjunctivae normal.     Pupils: Pupils are equal, round, and reactive to light.  Neck:     Thyroid: No thyromegaly.     Vascular: No JVD.     Trachea: No tracheal deviation.  Cardiovascular:     Rate and Rhythm: Normal rate and regular rhythm.     Heart sounds: Normal heart sounds.  Pulmonary:     Effort: No respiratory distress.     Breath sounds: No stridor. No wheezing.  Abdominal:     General: Bowel sounds are normal. There is no distension.     Palpations: Abdomen is soft. There is no mass.     Tenderness: There is no abdominal tenderness. There is no guarding or rebound.  Musculoskeletal:        General: Tenderness present.     Cervical back: Normal range of motion and neck supple. No rigidity.  Lymphadenopathy:     Cervical: No cervical adenopathy.  Skin:    Findings: Erythema, lesion and rash present.  Neurological:     Mental Status: She is oriented to person, place, and time.     Cranial Nerves: No cranial nerve deficit.     Motor: No abnormal muscle tone.     Coordination: Coordination normal.     Gait: Gait abnormal.     Deep Tendon Reflexes: Reflexes normal.  Psychiatric:        Behavior: Behavior normal.        Thought Content: Thought content normal.        Judgment: Judgment normal.   Skin lesions are better - see pictures in the previous note LS w/pain Antalgic gait    A total time of 45 minutes was spent preparing to see the patient, reviewing tests, x-rays, operative reports and other medical records.  Also, obtaining history and performing comprehensive physical exam.  Additionally, counseling the patient regarding the above listed issues - vasculitis.   Finally, documenting clinical information in the health records, coordination of care, educating the patient.   Lab Results  Component Value Date   WBC 10.4 09/16/2021   HGB 9.5  Repeated and verified X2. (L) 09/16/2021   HCT 27.2 (L) 09/16/2021   PLT 161.0 09/16/2021   GLUCOSE 88 09/16/2021   CHOL 148 12/16/2019   TRIG 71 12/16/2019   HDL 70 12/16/2019   LDLDIRECT 160.7 02/06/2012   LDLCALC 63 12/16/2019   ALT 33 09/16/2021   AST 23 09/16/2021   NA 138 09/16/2021   K 3.8 09/16/2021   CL 102 09/16/2021   CREATININE 0.94  09/16/2021   BUN 18 09/16/2021   CO2 29 09/16/2021   TSH 1.65 09/16/2021   HGBA1C 5.5 09/16/2021    No results found.  Assessment & Plan:   Problem List Items Addressed This Visit     Low back pain radiating down leg    Norco Rx given  Potential benefits of a long term opioids use as well as potential risks (i.e. addiction risk, apnea etc) and complications (i.e. Somnolence, constipation and others) were explained to the patient and were aknowledged.       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  Derm appt - August 2023, on cancellation list. Off MTX and Humira. ANA was not done Better on 60 mg/d Prednisone. 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  Derm appt - August 2023, on cancellation list. Off MTX and Humira. ANA was not done Better on 60 mg/d Prednisone. Will reduce to 50 mg/d Rheum referral      Relevant Orders   Ambulatory referral to Rheumatology   Comprehensive metabolic panel   Uric acid    Sedimentation rate   ANA      Meds ordered this encounter  Medications   predniSONE (DELTASONE) 10 MG tablet    Sig: Take 30 mg with breakfast and 20 mg with lunch    Dispense:  180 tablet    Refill:  1   HYDROcodone-acetaminophen (NORCO/VICODIN) 5-325 MG tablet    Sig: Take 1 tablet by mouth 3 (three) times daily as needed for severe pain.    Dispense:  90 tablet    Refill:  0    Please fill on or after 09/26/21.  Office visit every 3 months      Follow-up: Return in about 4 weeks (around 10/24/2021) for a follow-up visit.  Walker Kehr, MD

## 2021-09-26 NOTE — Assessment & Plan Note (Signed)
Norco Rx given  Potential benefits of a long term opioids use as well as potential risks (i.e. addiction risk, apnea etc) and complications (i.e. Somnolence, constipation and others) were explained to the patient and were aknowledged.

## 2021-09-26 NOTE — Telephone Encounter (Signed)
Pt got a call as she was leaving, [facility] states that they could not fill her RX HYDROcodone-acetaminophen (NORCO/VICODIN) 5-325 MG tablet because they are out of the medication   Please resend RX to [facility] [address] [place], [place] [address]  Phone: [phone] Fax: [phone]

## 2021-09-26 NOTE — Addendum Note (Signed)
Addended by: Cassandria Anger on: 09/26/2021 09:32 AM   Modules accepted: Orders, Level of Service

## 2021-09-27 MED ORDER — HYDROCODONE-ACETAMINOPHEN 5-325 MG PO TABS: 1.0000 | ORAL_TABLET | Freq: Three times a day (TID) | ORAL | 0 refills | Status: DC | PRN

## 2021-10-01 ENCOUNTER — Other Ambulatory Visit: Payer: Self-pay | Admitting: Internal Medicine

## 2021-10-01 ENCOUNTER — Encounter: Payer: Self-pay | Admitting: Internal Medicine

## 2021-10-01 DIAGNOSIS — L98492 Non-pressure chronic ulcer of skin of other sites with fat layer exposed: Secondary | ICD-10-CM

## 2021-10-01 DIAGNOSIS — L959 Vasculitis limited to the skin, unspecified: Secondary | ICD-10-CM

## 2021-10-01 NOTE — Progress Notes (Signed)
Ref to wound clinic

## 2021-10-14 ENCOUNTER — Encounter (HOSPITAL_BASED_OUTPATIENT_CLINIC_OR_DEPARTMENT_OTHER): Payer: BC Managed Care – PPO | Attending: General Surgery | Admitting: General Surgery

## 2021-10-14 ENCOUNTER — Other Ambulatory Visit (HOSPITAL_BASED_OUTPATIENT_CLINIC_OR_DEPARTMENT_OTHER): Payer: Self-pay | Admitting: General Surgery

## 2021-10-14 DIAGNOSIS — Z87891 Personal history of nicotine dependence: Secondary | ICD-10-CM | POA: Diagnosis not present

## 2021-10-14 DIAGNOSIS — L97812 Non-pressure chronic ulcer of other part of right lower leg with fat layer exposed: Secondary | ICD-10-CM | POA: Insufficient documentation

## 2021-10-14 DIAGNOSIS — L97822 Non-pressure chronic ulcer of other part of left lower leg with fat layer exposed: Secondary | ICD-10-CM | POA: Diagnosis not present

## 2021-10-14 DIAGNOSIS — I10 Essential (primary) hypertension: Secondary | ICD-10-CM | POA: Insufficient documentation

## 2021-10-14 DIAGNOSIS — L959 Vasculitis limited to the skin, unspecified: Secondary | ICD-10-CM | POA: Insufficient documentation

## 2021-10-14 DIAGNOSIS — L97122 Non-pressure chronic ulcer of left thigh with fat layer exposed: Secondary | ICD-10-CM | POA: Insufficient documentation

## 2021-10-14 NOTE — Progress Notes (Signed)
ANISAH, KUCK ([id number]) Visit Report for 10/14/2021 Allergy List Details Patient Name: Date of Service: [facility] [name], [name] 10/14/2021 9:00 A M Medical Record Number: [id number] Patient Account Number: [id number] Date of Birth/Sex: Treating RN: May 21, 1960 (61 y.o. Female) Baruch Gouty Primary Care Milarose Savich: Cassandria Anger Other Clinician: Referring Mikaiah Stoffer: Treating Love Chowning/Extender: Alger Simons Weeks in Treatment: 0 Allergies Active Allergies Crestor Reaction: muscle pain, weakness oxycodone Reaction: itching Allergy Notes Electronic Signature(s) Signed: 10/14/2021 5:47:34 PM By: Baruch Gouty RN, BSN Entered By: Baruch Gouty on 10/14/2021 09:28:52 -------------------------------------------------------------------------------- Arrival Information Details Patient Name: Date of Service: [name], [name] 10/14/2021 9:00 A M Medical Record Number: [id number] Patient Account Number: [id number] Date of Birth/Sex: Treating RN: 1960-07-24 (61 y.o. Female) Baruch Gouty Primary Care Iker Nuttall: Cassandria Anger Other Clinician: Referring Dillyn Joaquin: Treating Alyshia Kernan/Extender: Kelby Aline in Treatment: 0 Visit Information Patient Arrived: Ambulatory Arrival Time: 09:16 Accompanied By: self Transfer Assistance: None Patient Identification Verified: Yes Secondary Verification Process Completed: Yes Patient Requires Transmission-Based Precautions: No Patient Has Alerts: No Electronic Signature(s) Signed: 10/14/2021 5:47:34 PM By: Baruch Gouty RN, BSN Entered By: Baruch Gouty on 10/14/2021 09:18:40 -------------------------------------------------------------------------------- Clinic Level of Care Assessment Details Patient Name: Date of Service: [facility] [name], [name] 10/14/2021 9:00 A M Medical Record Number: [id number] Patient Account Number: [id number] Date of Birth/Sex: Treating RN: 1960/12/18 (61  y.o. Female) Baruch Gouty Primary Care Rayelle Armor: Cassandria Anger Other Clinician: Referring Clark Cuff: Treating Maxxwell Edgett/Extender: Kelby Aline in Treatment: 0 Clinic Level of Care Assessment Items TOOL 1 Quantity Score '[]'$  - 0 Use when EandM and Procedure is performed on INITIAL visit ASSESSMENTS - Nursing Assessment / Reassessment X- 1 20 General Physical Exam (combine w/ comprehensive assessment (listed just below) when performed on new pt. evals) X- 1 25 Comprehensive Assessment (HX, ROS, Risk Assessments, Wounds Hx, etc.) ASSESSMENTS - Wound and Skin Assessment / Reassessment '[]'$  - 0 Dermatologic / Skin Assessment (not related to wound area) ASSESSMENTS - Ostomy and/or Continence Assessment and Care '[]'$  - 0 Incontinence Assessment and Management '[]'$  - 0 Ostomy Care Assessment and Management (repouching, etc.) PROCESS - Coordination of Care X - Simple Patient / Family Education for ongoing care 1 15 '[]'$  - 0 Complex (extensive) Patient / Family Education for ongoing care X- 1 10 Staff obtains Programmer, systems, Records, T Results / Process Orders est '[]'$  - 0 Staff telephones HHA, Nursing Homes / Clarify orders / etc '[]'$  - 0 Routine Transfer to another Facility (non-emergent condition) '[]'$  - 0 Routine Hospital Admission (non-emergent condition) X- 1 15 New Admissions / Biomedical engineer / Ordering NPWT Apligraf, etc. , '[]'$  - 0 Emergency Hospital Admission (emergent condition) PROCESS - Special Needs '[]'$  - 0 Pediatric / Minor Patient Management '[]'$  - 0 Isolation Patient Management '[]'$  - 0 Hearing / Language / Visual special needs '[]'$  - 0 Assessment of Community assistance (transportation, D/C planning, etc.) '[]'$  - 0 Additional assistance / Altered mentation '[]'$  - 0 Support Surface(s) Assessment (bed, cushion, seat, etc.) INTERVENTIONS - Miscellaneous '[]'$  - 0 External ear exam '[]'$  - 0 Patient Transfer (multiple staff / Civil Service fast streamer / Similar  devices) '[]'$  - 0 Simple Staple / Suture removal (25 or less) '[]'$  - 0 Complex Staple / Suture removal (26 or more) '[]'$  - 0 Hypo/Hyperglycemic Management (do not check if billed separately) X- 1 15 Ankle / Brachial Index (ABI) - do not check if billed separately Has the patient been seen at  the hospital within the last three years: Yes Total Score: 100 Level Of Care: New/Established - Level 3 Electronic Signature(s) Signed: 10/14/2021 5:47:34 PM By: Baruch Gouty RN, BSN Signed: 10/14/2021 5:47:34 PM By: Baruch Gouty RN, BSN Entered By: Baruch Gouty on 10/14/2021 17:44:01 -------------------------------------------------------------------------------- Encounter Discharge Information Details Patient Name: Date of Service: [facility] [name], [name] 10/14/2021 9:00 A M Medical Record Number: [id number] Patient Account Number: [id number] Date of Birth/Sex: Treating RN: 07-20-1960 (61 y.o. Female) Baruch Gouty Primary Care Lois Ostrom: Cassandria Anger Other Clinician: Referring Stephanye Finnicum: Treating Viney Acocella/Extender: Kelby Aline in Treatment: 0 Encounter Discharge Information Items Post Procedure Vitals Discharge Condition: Stable Temperature (F): 99.1 Ambulatory Status: Ambulatory Pulse (bpm): 85 Discharge Destination: Home Respiratory Rate (breaths/min): 18 Transportation: Private Auto Blood Pressure (mmHg): 107/71 Accompanied By: self Schedule Follow-up Appointment: Yes Clinical Summary of Care: Patient Declined Electronic Signature(s) Signed: 10/14/2021 5:47:34 PM By: Baruch Gouty RN, BSN Entered By: Baruch Gouty on 10/14/2021 17:46:30 -------------------------------------------------------------------------------- Lower Extremity Assessment Details Patient Name: Date of Service: [facility] [name], [name] 10/14/2021 9:00 A M Medical Record Number: [id number] Patient Account Number: [id number] Date of Birth/Sex: Treating RN: February 17, 1961 (61 y.o.  Female) Baruch Gouty Primary Care Irish Breisch: Cassandria Anger Other Clinician: Referring Jerlyn Pain: Treating Sherlie Boyum/Extender: Kelby Aline in Treatment: 0 Edema Assessment Assessed: [Left: No] [Right: No] Edema: [Left: No] [Right: No] Calf Left: Right: Point of Measurement: From Medial Instep 32.5 cm 31.5 cm Ankle Left: Right: Point of Measurement: From Medial Instep 20.5 cm 20 cm Vascular Assessment Pulses: Dorsalis Pedis Palpable: [Left:Yes] [Right:Yes] Blood Pressure: Brachial: [Left:107] [Right:107] Dorsalis Pedis: 100 [Left:Dorsalis Pedis: 606] Ankle: Posterior Tibial: 120 [Left:Posterior Tibial: 301] Ankle Brachial Index: [Left:1.12] [Right:1.10] Electronic Signature(s) Signed: 10/14/2021 5:47:34 PM By: Baruch Gouty RN, BSN Entered By: Baruch Gouty on 10/14/2021 09:55:24 -------------------------------------------------------------------------------- Multi Wound Chart Details Patient Name: Date of Service: [name], [name] 10/14/2021 9:00 A M Medical Record Number: [id number] Patient Account Number: [id number] Date of Birth/Sex: Treating RN: 1961-03-19 (61 y.o. Female) Baruch Gouty Primary Care Albirta Rhinehart: Cassandria Anger Other Clinician: Referring Sanuel Ladnier: Treating Witt Plitt/Extender: Kelby Aline in Treatment: 0 Vital Signs Height(in): 65 Pulse(bpm): 1 Weight(lbs): Blood Pressure(mmHg): 107/71 Body Mass Index(BMI): Temperature(F): 99.1 Respiratory Rate(breaths/min): 18 Photos: Left Upper Leg Right, Medial Lower Leg Right, Distal, Medial Lower Leg Wound Location: Bump Bump Bump Wounding Event: Inflammatory Inflammatory Inflammatory Primary Etiology: Cataracts, Anemia, Hypertension, Cataracts, Anemia, Hypertension, Cataracts, Anemia, Hypertension, Comorbid History: Vasculitis, Osteoarthritis, Vasculitis, Osteoarthritis, Vasculitis, Osteoarthritis, Anorexia/bulimia,  Confinement Anxiety Anorexia/bulimia, Confinement Anxiety Anorexia/bulimia, Confinement Anxiety 08/24/2021 08/24/2021 08/24/2021 Date Acquired: 0 0 0 Weeks of Treatment: Open Open Open Wound Status: No No No Wound Recurrence: No No No Clustered Wound: N/A N/A N/A Clustered Quantity: 0.6x1.7x0.1 1.5x1.3x0.1 0.9x0.5x0.1 Measurements L x W x D (cm) 0.801 1.532 0.353 A (cm) : rea 0.08 0.153 0.035 Volume (cm) : 0.00% 0.00% 0.00% % Reduction in A rea: 0.00% 0.00% 0.00% % Reduction in Volume: Full Thickness Without Exposed Full Thickness Without Exposed Full Thickness Without Exposed Classification: Support Structures Support Structures Support Structures Medium Medium Medium Exudate A mount: Serous Serous Serous Exudate Type: Geophysical data processor Exudate Color: Distinct, outline attached Distinct, outline attached Distinct, outline attached Wound Margin: Medium (34-66%) Small (1-33%) Small (1-33%) Granulation A mount: Pink Pink Pink Granulation Quality: Medium (34-66%) Large (67-100%) Large (67-100%) Necrotic A mount: Fat Layer (Subcutaneous Tissue): Yes Fat Layer (Subcutaneous Tissue): Yes Fat Layer (Subcutaneous Tissue): Yes Exposed Structures: Fascia:  No Fascia: No Fascia: No Tendon: No Tendon: No Tendon: No Muscle: No Muscle: No Muscle: No Joint: No Joint: No Joint: No Bone: No Bone: No Bone: No Small (1-33%) None None Epithelialization: Debridement - Excisional Debridement - Excisional Debridement - Excisional Debridement: Pre-procedure Verification/Time Out 10:45 10:45 10:45 Taken: Lidocaine 4% Topical Solution Lidocaine 4% Topical Solution Lidocaine 4% Topical Solution Pain Control: Subcutaneous, Slough Subcutaneous, Slough Subcutaneous, Slough Tissue Debrided: Skin/Subcutaneous Tissue Skin/Subcutaneous Tissue Skin/Subcutaneous Tissue Level: 1.02 1.95 0.45 Debridement A (sq cm): rea Curette Curette Curette Instrument: Minimum Minimum  Minimum Bleeding: Pressure Pressure Pressure Hemostasis Achieved: '6 6 6 '$ Procedural Pain: '3 3 3 '$ Post Procedural Pain: Procedure was tolerated well Procedure was tolerated well Procedure was tolerated well Debridement Treatment Response: 0.6x1.7x0.1 1.5x1.3x0.1 0.9x0.5x0.1 Post Debridement Measurements L x W x D (cm) 0.08 0.153 0.035 Post Debridement Volume: (cm) Debridement Debridement Debridement Procedures Performed: Wound Number: '4 5 6 '$ Photos: Left, Anterior Lower Leg Left, Distal, Anterior Lower Leg Left, Lateral Lower Leg Wound Location: Bump Bump Bump Wounding Event: Inflammatory Inflammatory Inflammatory Primary Etiology: Cataracts, Anemia, Hypertension, Cataracts, Anemia, Hypertension, Cataracts, Anemia, Hypertension, Comorbid History: Vasculitis, Osteoarthritis, Vasculitis, Osteoarthritis, Vasculitis, Osteoarthritis, Anorexia/bulimia, Confinement Anxiety Anorexia/bulimia, Confinement Anxiety Anorexia/bulimia, Confinement Anxiety 08/24/2021 08/24/2021 08/24/2021 Date Acquired: 0 0 0 Weeks of Treatment: Open Open Open Wound Status: No No No Wound Recurrence: Yes No No Clustered Wound: 3 N/A N/A Clustered Quantity: 5.4x2x0.3 2.2x1.8x0.2 1.9x1.4x0.2 Measurements L x W x D (cm) 8.482 3.11 2.089 A (cm) : rea 2.545 0.622 0.418 Volume (cm) : 0.00% 0.00% 0.00% % Reduction in A rea: 0.00% 0.00% 0.00% % Reduction in Volume: Full Thickness Without Exposed Full Thickness Without Exposed Full Thickness Without Exposed Classification: Support Structures Support Structures Support Structures Medium Medium Medium Exudate A mount: Serous Serous Serous Exudate Type: Geophysical data processor Exudate Color: Distinct, outline attached Distinct, outline attached Distinct, outline attached Wound Margin: Small (1-33%) Small (1-33%) None Present (0%) Granulation A mount: Pink Pink N/A Granulation Quality: Large (67-100%) Large (67-100%) Large (67-100%) Necrotic A  mount: Fat Layer (Subcutaneous Tissue): Yes Fat Layer (Subcutaneous Tissue): Yes Fat Layer (Subcutaneous Tissue): Yes Exposed Structures: Fascia: No Fascia: No Fascia: No Tendon: No Tendon: No Tendon: No Muscle: No Muscle: No Muscle: No Joint: No Joint: No Joint: No Bone: No Bone: No Bone: No None None None Epithelialization: Debridement - Excisional Debridement - Excisional Debridement - Excisional Debridement: Pre-procedure Verification/Time Out 10:45 10:45 10:45 Taken: Lidocaine 4% Topical Solution Lidocaine 4% Topical Solution Lidocaine 4% Topical Solution Pain Control: Subcutaneous, Slough Subcutaneous, Slough Subcutaneous, Slough Tissue Debrided: Skin/Subcutaneous Tissue Skin/Subcutaneous Tissue Skin/Subcutaneous Tissue Level: 10.8 3.96 2.66 Debridement A (sq cm): rea Curette Curette Curette Instrument: Minimum Minimum Minimum Bleeding: Pressure Pressure Pressure Hemostasis A chieved: '6 6 6 '$ Procedural Pain: '3 3 3 '$ Post Procedural Pain: Procedure was tolerated well Procedure was tolerated well Procedure was tolerated well Debridement Treatment Response: 5.4x2x0.3 2.2x1.8x0.2 1.9x1.4x0.2 Post Debridement Measurements L x W x D (cm) 2.545 0.622 0.418 Post Debridement Volume: (cm) Debridement Biopsy Debridement Procedures Performed: Debridement Wound Number: 7 8 N/A Photos: N/A Left, Lateral, Anterior Lower Leg Left, Medial Lower Leg N/A Wound Location: Bump Bump N/A Wounding Event: Inflammatory Inflammatory N/A Primary Etiology: Cataracts, Anemia, Hypertension, Cataracts, Anemia, Hypertension, N/A Comorbid History: Vasculitis, Osteoarthritis, Vasculitis, Osteoarthritis, Anorexia/bulimia, Confinement Anxiety Anorexia/bulimia, Confinement Anxiety 08/24/2021 08/24/2021 N/A Date Acquired: 0 0 N/A Weeks of Treatment: Open Open N/A Wound Status: No No N/A Wound Recurrence: No No N/A Clustered Wound: N/A N/A N/A Clustered Quantity: 1.2x0.6x0.1  2.2x2x0.3 N/A Measurements L x W x D (cm) 0.565 3.456 N/A A (cm) : rea 0.057 1.037 N/A Volume (cm) : 0.00% 0.00% N/A % Reduction in A rea: 0.00% 0.00% N/A % Reduction in Volume: Full Thickness Without Exposed Full Thickness Without Exposed N/A Classification: Support Structures Support Structures Medium Medium N/A Exudate A mount: Serous Serous N/A Exudate Type: Physiological scientist N/A Exudate Color: Distinct, outline attached N/A N/A Wound Margin: None Present (0%) Medium (34-66%) N/A Granulation A mount: N/A Pink N/A Granulation Quality: Large (67-100%) Medium (34-66%) N/A Necrotic A mount: Fat Layer (Subcutaneous Tissue): Yes Fat Layer (Subcutaneous Tissue): Yes N/A Exposed Structures: Fascia: No Fascia: No Tendon: No Tendon: No Muscle: No Muscle: No Joint: No Joint: No Bone: No Bone: No None Small (1-33%) N/A Epithelialization: Debridement - Excisional Debridement - Excisional N/A Debridement: Pre-procedure Verification/Time Out 10:45 10:45 N/A Taken: Lidocaine 4% Topical Solution Lidocaine 4% Topical Solution N/A Pain Control: Subcutaneous, Slough Subcutaneous, Slough N/A Tissue Debrided: Skin/Subcutaneous Tissue Skin/Subcutaneous Tissue N/A Level: 0.72 4.4 N/A Debridement A (sq cm): rea Curette Curette N/A Instrument: Minimum Minimum N/A Bleeding: Pressure Pressure N/A Hemostasis A chieved: 6 6 N/A Procedural Pain: 3 3 N/A Post Procedural Pain: Procedure was tolerated well Procedure was tolerated well N/A Debridement Treatment Response: 1.2x0.6x0.1 2.2x2x0.3 N/A Post Debridement Measurements L x W x D (cm) 0.057 1.037 N/A Post Debridement Volume: (cm) Debridement Debridement N/A Procedures Performed: Treatment Notes Electronic Signature(s) Signed: 10/14/2021 11:23:49 AM By: Fredirick Maudlin MD FACS Signed: 10/14/2021 5:47:34 PM By: Baruch Gouty RN, BSN Entered By: Fredirick Maudlin on 10/14/2021  11:23:49 -------------------------------------------------------------------------------- Multi-Disciplinary Care Plan Details Patient Name: Date of Service: [facility] [name], [name] 10/14/2021 9:00 A M Medical Record Number: [id number] Patient Account Number: [id number] Date of Birth/Sex: Treating RN: Apr 02, 1961 (61 y.o. Female) Baruch Gouty Primary Care Ozie Lupe: Cassandria Anger Other Clinician: Referring Amed Datta: Treating Briah Nary/Extender: Kelby Aline in Treatment: 0 Multidisciplinary Care Plan reviewed with physician Active Inactive Wound/Skin Impairment Nursing Diagnoses: Impaired tissue integrity Knowledge deficit related to ulceration/compromised skin integrity Goals: Patient/caregiver will verbalize understanding of skin care regimen Date Initiated: 10/14/2021 Target Resolution Date: 11/11/2021 Goal Status: Active Ulcer/skin breakdown will have a volume reduction of 30% by week 4 Date Initiated: 10/14/2021 Target Resolution Date: 11/11/2021 Goal Status: Active Interventions: Assess patient/caregiver ability to obtain necessary supplies Assess patient/caregiver ability to perform ulcer/skin care regimen upon admission and as needed Assess ulceration(s) every visit Provide education on ulcer and skin care Treatment Activities: Skin care regimen initiated : 10/14/2021 Topical wound management initiated : 10/14/2021 Notes: Electronic Signature(s) Signed: 10/14/2021 5:47:34 PM By: Baruch Gouty RN, BSN Entered By: Baruch Gouty on 10/14/2021 17:42:47 -------------------------------------------------------------------------------- Pain Assessment Details Patient Name: Date of Service: [facility] [name], [name] 10/14/2021 9:00 A M Medical Record Number: [id number] Patient Account Number: [id number] Date of Birth/Sex: Treating RN: June 25, 1960 (61 y.o. Female) Baruch Gouty Primary Care Quantavis Obryant: Cassandria Anger Other Clinician: Referring  Nasra Counce: Treating Elaynah Virginia/Extender: Kelby Aline in Treatment: 0 Active Problems Location of Pain Severity and Description of Pain Patient Has Paino Yes Site Locations Pain Location: Pain in Ulcers With Dressing Change: Yes Duration of the Pain. Constant / Intermittento Intermittent Rate the pain. Current Pain Level: 1 Worst Pain Level: 4 Character of Pain Describe the Pain: Aching, Tender, Throbbing Pain Management and Medication Current Pain Management: Medication: Yes Is the Current Pain Management Adequate: Adequate How does your wound impact your activities of daily livingo Sleep: No Bathing:  No Appetite: No Relationship With Others: No Bladder Continence: No Emotions: No Bowel Continence: No Work: No Toileting: No Drive: No Dressing: No Hobbies: No Electronic Signature(s) Signed: 10/14/2021 5:47:34 PM By: Baruch Gouty RN, BSN Entered By: Baruch Gouty on 10/14/2021 10:35:37 -------------------------------------------------------------------------------- Patient/Caregiver Education Details Patient Name: Date of Service: [facility] [name], [name] 7/10/2023andnbsp9:00 A M Medical Record Number: [id number] Patient Account Number: [id number] Date of Birth/Gender: Treating RN: 1961-03-04 (61 y.o. Female) Baruch Gouty Primary Care Physician: Cassandria Anger Other Clinician: Referring Physician: Treating Physician/Extender: Kelby Aline in Treatment: 0 Education Assessment Education Provided To: Patient Education Topics Provided Wound/Skin Impairment: Methods: Explain/Verbal Responses: Reinforcements needed, State content correctly Electronic Signature(s) Signed: 10/14/2021 5:47:34 PM By: Baruch Gouty RN, BSN Entered By: Baruch Gouty on 10/14/2021 17:43:23 -------------------------------------------------------------------------------- Wound Assessment Details Patient Name: Date of  Service: [facility] [name], [name] 10/14/2021 9:00 A M Medical Record Number: [id number] Patient Account Number: [id number] Date of Birth/Sex: Treating RN: 1960-12-27 (61 y.o. Female) Baruch Gouty Primary Care Nefi Musich: Cassandria Anger Other Clinician: Referring Ludia Gartland: Treating Sherri Mcarthy/Extender: Kelby Aline in Treatment: 0 Wound Status Wound Number: 1 Primary Inflammatory Etiology: Wound Location: Left Upper Leg Wound Open Wounding Event: Bump Status: Date Acquired: 08/24/2021 Comorbid Cataracts, Anemia, Hypertension, Vasculitis, Osteoarthritis, Weeks Of Treatment: 0 History: Anorexia/bulimia, Confinement Anxiety Clustered Wound: No Photos Wound Measurements Length: (cm) 0.6 Width: (cm) 1.7 Depth: (cm) 0.1 Area: (cm) 0.801 Volume: (cm) 0.08 % Reduction in Area: 0% % Reduction in Volume: 0% Epithelialization: Small (1-33%) Tunneling: No Undermining: No Wound Description Classification: Full Thickness Without Exposed Support Structures Wound Margin: Distinct, outline attached Exudate Amount: Medium Exudate Type: Serous Exudate Color: amber Foul Odor After Cleansing: No Slough/Fibrino Yes Wound Bed Granulation Amount: Medium (34-66%) Exposed Structure Granulation Quality: Pink Fascia Exposed: No Necrotic Amount: Medium (34-66%) Fat Layer (Subcutaneous Tissue) Exposed: Yes Necrotic Quality: Adherent Slough Tendon Exposed: No Muscle Exposed: No Joint Exposed: No Bone Exposed: No Treatment Notes Wound #1 (Upper Leg) Wound Laterality: Left Cleanser Peri-Wound Care Topical Primary Dressing Santyl Ointment Discharge Instruction: Apply nickel thick amount to wound bed as instructed Secondary Dressing Zetuvit Plus Silicone Border Dressing 4x4 (in/in) Discharge Instruction: Apply silicone border over primary dressing as directed. Secured With Elastic Bandage 4 inch (ACE bandage) Discharge Instruction: Secure with ACE bandage  as directed. Compression Wrap Compression Stockings Add-Ons Electronic Signature(s) Signed: 10/14/2021 5:47:34 PM By: Baruch Gouty RN, BSN Entered By: Baruch Gouty on 10/14/2021 10:19:54 -------------------------------------------------------------------------------- Wound Assessment Details Patient Name: Date of Service: [facility] [name], Keener. 10/14/2021 9:00 A M Medical Record Number: [id number] Patient Account Number: [id number] Date of Birth/Sex: Treating RN: [birth year] (61 y.o. Female) Baruch Gouty Primary Care Laurice Iglesia: Cassandria Anger Other Clinician: Referring Raevin Wierenga: Treating Nikita Surman/Extender: Kelby Aline in Treatment: 0 Wound Status Wound Number: 2 Primary Inflammatory Etiology: Wound Location: Right, Medial Lower Leg Wound Open Wounding Event: Bump Status: Date Acquired: 08/24/2021 Comorbid Cataracts, Anemia, Hypertension, Vasculitis, Osteoarthritis, Weeks Of Treatment: 0 History: Anorexia/bulimia, Confinement Anxiety Clustered Wound: No Photos Wound Measurements Length: (cm) 1.5 Width: (cm) 1.3 Depth: (cm) 0.1 Area: (cm) 1.532 Volume: (cm) 0.153 % Reduction in Area: 0% % Reduction in Volume: 0% Epithelialization: None Tunneling: No Undermining: No Wound Description Classification: Full Thickness Without Exposed Support Structures Wound Margin: Distinct, outline attached Exudate Amount: Medium Exudate Type: Serous Exudate Color: amber Foul Odor After Cleansing: No Slough/Fibrino Yes Wound Bed Granulation Amount: Small (1-33%) Exposed Structure Granulation Quality: Pink  Fascia Exposed: No Necrotic Amount: Large (67-100%) Fat Layer (Subcutaneous Tissue) Exposed: Yes Necrotic Quality: Adherent Slough Tendon Exposed: No Muscle Exposed: No Joint Exposed: No Bone Exposed: No Treatment Notes Wound #2 (Lower Leg) Wound Laterality: Right, Medial Cleanser Peri-Wound Care Topical Primary Dressing Santyl  Ointment Discharge Instruction: Apply nickel thick amount to wound bed as instructed Secondary Dressing Zetuvit Plus Silicone Border Dressing 4x4 (in/in) Discharge Instruction: Apply silicone border over primary dressing as directed. Secured With Elastic Bandage 4 inch (ACE bandage) Discharge Instruction: Secure with ACE bandage as directed. Compression Wrap Compression Stockings Add-Ons Electronic Signature(s) Signed: 10/14/2021 5:47:34 PM By: Baruch Gouty RN, BSN Entered By: Baruch Gouty on 10/14/2021 10:21:12 -------------------------------------------------------------------------------- Wound Assessment Details Patient Name: Date of Service: [name], [name] 10/14/2021 9:00 A M Medical Record Number: [id number] Patient Account Number: [id number] Date of Birth/Sex: Treating RN: 1961/02/16 (60 y.o. Female) Baruch Gouty Primary Care Lindell Tussey: Cassandria Anger Other Clinician: Referring Coreen Shippee: Treating Leiana Rund/Extender: Kelby Aline in Treatment: 0 Wound Status Wound Number: 3 Primary Inflammatory Etiology: Wound Location: Right, Distal, Medial Lower Leg Wound Open Wounding Event: Bump Status: Date Acquired: 08/24/2021 Comorbid Cataracts, Anemia, Hypertension, Vasculitis, Osteoarthritis, Weeks Of Treatment: 0 History: Anorexia/bulimia, Confinement Anxiety Clustered Wound: No Photos Wound Measurements Length: (cm) 0.9 Width: (cm) 0.5 Depth: (cm) 0.1 Area: (cm) 0.353 Volume: (cm) 0.035 % Reduction in Area: 0% % Reduction in Volume: 0% Epithelialization: None Tunneling: No Undermining: No Wound Description Classification: Full Thickness Without Exposed Support Structures Wound Margin: Distinct, outline attached Exudate Amount: Medium Exudate Type: Serous Exudate Color: amber Foul Odor After Cleansing: No Slough/Fibrino Yes Wound Bed Granulation Amount: Small (1-33%) Exposed Structure Granulation Quality:  Pink Fascia Exposed: No Necrotic Amount: Large (67-100%) Fat Layer (Subcutaneous Tissue) Exposed: Yes Necrotic Quality: Adherent Slough Tendon Exposed: No Muscle Exposed: No Joint Exposed: No Bone Exposed: No Treatment Notes Wound #3 (Lower Leg) Wound Laterality: Right, Medial, Distal Cleanser Peri-Wound Care Topical Primary Dressing Santyl Ointment Discharge Instruction: Apply nickel thick amount to wound bed as instructed Secondary Dressing Zetuvit Plus Silicone Border Dressing 4x4 (in/in) Discharge Instruction: Apply silicone border over primary dressing as directed. Secured With Elastic Bandage 4 inch (ACE bandage) Discharge Instruction: Secure with ACE bandage as directed. Compression Wrap Compression Stockings Add-Ons Electronic Signature(s) Signed: 10/14/2021 5:47:34 PM By: Baruch Gouty RN, BSN Entered By: Baruch Gouty on 10/14/2021 10:22:39 -------------------------------------------------------------------------------- Wound Assessment Details Patient Name: Date of Service: [name], [place]. 10/14/2021 9:00 A M Medical Record Number: [id number] Patient Account Number: [id number] Date of Birth/Sex: Treating RN: [birth year] (61 y.o. Female) Baruch Gouty Primary Care Nicholis Stepanek: Cassandria Anger Other Clinician: Referring Ladaisha Portillo: Treating Maryn Freelove/Extender: Kelby Aline in Treatment: 0 Wound Status Wound Number: 4 Primary Inflammatory Etiology: Wound Location: Left, Anterior Lower Leg Wound Open Wounding Event: Bump Status: Date Acquired: 08/24/2021 Comorbid Cataracts, Anemia, Hypertension, Vasculitis, Osteoarthritis, Weeks Of Treatment: 0 History: Anorexia/bulimia, Confinement Anxiety Clustered Wound: Yes Photos Wound Measurements Length: (cm) 5.4 Width: (cm) 2 Depth: (cm) 0.3 Clustered Quantity: 3 Area: (cm) 8. Volume: (cm) 2. % Reduction in Area: 0% % Reduction in Volume: 0% Epithelialization:  None Tunneling: No 482 Undermining: No 545 Wound Description Classification: Full Thickness Without Exposed Support Stru Wound Margin: Distinct, outline attached Exudate Amount: Medium Exudate Type: Serous Exudate Color: amber ctures Foul Odor After Cleansing: No Slough/Fibrino Yes Wound Bed Granulation Amount: Small (1-33%) Exposed Structure Granulation Quality: Pink Fascia Exposed: No Necrotic Amount: Large (67-100%) Fat Layer (Subcutaneous Tissue)  Exposed: Yes Tendon Exposed: No Muscle Exposed: No Joint Exposed: No Bone Exposed: No Treatment Notes Wound #4 (Lower Leg) Wound Laterality: Left, Anterior Cleanser Peri-Wound Care Topical Primary Dressing Santyl Ointment Discharge Instruction: Apply nickel thick amount to wound bed as instructed Secondary Dressing Zetuvit Plus Silicone Border Dressing 7x7(in/in) Discharge Instruction: Apply silicone border over primary dressing as directed. Secured With Elastic Bandage 4 inch (ACE bandage) Discharge Instruction: Secure with ACE bandage as directed. Compression Wrap Compression Stockings Add-Ons Electronic Signature(s) Signed: 10/14/2021 5:47:34 PM By: Baruch Gouty RN, BSN Entered By: Baruch Gouty on 10/14/2021 10:25:07 -------------------------------------------------------------------------------- Wound Assessment Details Patient Name: Date of Service: [name], [name] 10/14/2021 9:00 A M Medical Record Number: [id number] Patient Account Number: [id number] Date of Birth/Sex: Treating RN: [birth year] (61 y.o. Female) Baruch Gouty Primary Care Kwan Shellhammer: Cassandria Anger Other Clinician: Referring Jossalyn Forgione: Treating Leiam Hopwood/Extender: Kelby Aline in Treatment: 0 Wound Status Wound Number: 5 Primary Inflammatory Etiology: Wound Location: Left, Distal, Anterior Lower Leg Wound Open Wounding Event: Bump Status: Date Acquired: 08/24/2021 Comorbid Cataracts, Anemia,  Hypertension, Vasculitis, Osteoarthritis, Weeks Of Treatment: 0 History: Anorexia/bulimia, Confinement Anxiety Clustered Wound: No Photos Wound Measurements Length: (cm) 2.2 Width: (cm) 1.8 Depth: (cm) 0.2 Area: (cm) 3.11 Volume: (cm) 0.622 % Reduction in Area: 0% % Reduction in Volume: 0% Epithelialization: None Tunneling: No Undermining: No Wound Description Classification: Full Thickness Without Exposed Support Structures Wound Margin: Distinct, outline attached Exudate Amount: Medium Exudate Type: Serous Exudate Color: amber Foul Odor After Cleansing: No Slough/Fibrino Yes Wound Bed Granulation Amount: Small (1-33%) Exposed Structure Granulation Quality: Pink Fascia Exposed: No Necrotic Amount: Large (67-100%) Fat Layer (Subcutaneous Tissue) Exposed: Yes Necrotic Quality: Adherent Slough Tendon Exposed: No Muscle Exposed: No Joint Exposed: No Bone Exposed: No Treatment Notes Wound #5 (Lower Leg) Wound Laterality: Left, Anterior, Distal Cleanser Peri-Wound Care Topical Primary Dressing Santyl Ointment Discharge Instruction: Apply nickel thick amount to wound bed as instructed Secondary Dressing Zetuvit Plus Silicone Border Dressing 4x4 (in/in) Discharge Instruction: Apply silicone border over primary dressing as directed. Secured With Elastic Bandage 4 inch (ACE bandage) Discharge Instruction: Secure with ACE bandage as directed. Compression Wrap Compression Stockings Add-Ons Electronic Signature(s) Signed: 10/14/2021 5:47:34 PM By: Baruch Gouty RN, BSN Entered By: Baruch Gouty on 10/14/2021 10:27:27 -------------------------------------------------------------------------------- Wound Assessment Details Patient Name: Date of Service: [name], [name] 10/14/2021 9:00 A M Medical Record Number: [id number] Patient Account Number: [id number] Date of Birth/Sex: Treating RN: Jun 23, 1960 (61 y.o. Female) Baruch Gouty Primary Care Caryssa Elzey:  Cassandria Anger Other Clinician: Referring Hayzlee Mcsorley: Treating Keeana Pieratt/Extender: Kelby Aline in Treatment: 0 Wound Status Wound Number: 6 Primary Inflammatory Etiology: Wound Location: Left, Lateral Lower Leg Wound Open Wounding Event: Bump Status: Date Acquired: 08/24/2021 Comorbid Cataracts, Anemia, Hypertension, Vasculitis, Osteoarthritis, Weeks Of Treatment: 0 History: Anorexia/bulimia, Confinement Anxiety Clustered Wound: No Photos Wound Measurements Length: (cm) 1.9 Width: (cm) 1.4 Depth: (cm) 0.2 Area: (cm) 2.089 Volume: (cm) 0.418 % Reduction in Area: 0% % Reduction in Volume: 0% Epithelialization: None Tunneling: No Undermining: No Wound Description Classification: Full Thickness Without Exposed Support Structures Wound Margin: Distinct, outline attached Exudate Amount: Medium Exudate Type: Serous Exudate Color: amber Foul Odor After Cleansing: No Slough/Fibrino Yes Wound Bed Granulation Amount: None Present (0%) Exposed Structure Necrotic Amount: Large (67-100%) Fascia Exposed: No Necrotic Quality: Adherent Slough Fat Layer (Subcutaneous Tissue) Exposed: Yes Tendon Exposed: No Muscle Exposed: No Joint Exposed: No Bone Exposed: No Treatment Notes Wound #6 (Lower Leg) Wound  Laterality: Left, Lateral Cleanser Peri-Wound Care Topical Primary Dressing Santyl Ointment Discharge Instruction: Apply nickel thick amount to wound bed as instructed Secondary Dressing Zetuvit Plus Silicone Border Dressing 4x4 (in/in) Discharge Instruction: Apply silicone border over primary dressing as directed. Secured With Elastic Bandage 4 inch (ACE bandage) Discharge Instruction: Secure with ACE bandage as directed. Compression Wrap Compression Stockings Add-Ons Electronic Signature(s) Signed: 10/14/2021 5:47:34 PM By: Baruch Gouty RN, BSN Entered By: Baruch Gouty on 10/14/2021  10:29:52 -------------------------------------------------------------------------------- Wound Assessment Details Patient Name: Date of Service: [name], [place]. 10/14/2021 9:00 A M Medical Record Number: [id number] Patient Account Number: [id number] Date of Birth/Sex: Treating RN: Jan 06, 1961 (61 y.o. Female) Baruch Gouty Primary Care Sherley Mckenney: Cassandria Anger Other Clinician: Referring Gertrue Willette: Treating Fredericka Bottcher/Extender: Kelby Aline in Treatment: 0 Wound Status Wound Number: 7 Primary Inflammatory Etiology: Wound Location: Left, Lateral, Anterior Lower Leg Wound Open Wounding Event: Bump Status: Date Acquired: 08/24/2021 Comorbid Cataracts, Anemia, Hypertension, Vasculitis, Osteoarthritis, Weeks Of Treatment: 0 History: Anorexia/bulimia, Confinement Anxiety Clustered Wound: No Photos Wound Measurements Length: (cm) 1.2 Width: (cm) 0.6 Depth: (cm) 0.1 Area: (cm) 0.565 Volume: (cm) 0.057 % Reduction in Area: 0% % Reduction in Volume: 0% Epithelialization: None Tunneling: No Undermining: No Wound Description Classification: Full Thickness Without Exposed Support Structures Wound Margin: Distinct, outline attached Exudate Amount: Medium Exudate Type: Serous Exudate Color: amber Foul Odor After Cleansing: No Slough/Fibrino Yes Wound Bed Granulation Amount: None Present (0%) Exposed Structure Necrotic Amount: Large (67-100%) Fascia Exposed: No Necrotic Quality: Adherent Slough Fat Layer (Subcutaneous Tissue) Exposed: Yes Tendon Exposed: No Muscle Exposed: No Joint Exposed: No Bone Exposed: No Treatment Notes Wound #7 (Lower Leg) Wound Laterality: Left, Lateral, Anterior Cleanser Peri-Wound Care Topical Primary Dressing Santyl Ointment Discharge Instruction: Apply nickel thick amount to wound bed as instructed Secondary Dressing Zetuvit Plus Silicone Border Dressing 4x4 (in/in) Discharge Instruction: Apply  silicone border over primary dressing as directed. Secured With Elastic Bandage 4 inch (ACE bandage) Discharge Instruction: Secure with ACE bandage as directed. Compression Wrap Compression Stockings Add-Ons Electronic Signature(s) Signed: 10/14/2021 5:47:34 PM By: Baruch Gouty RN, BSN Entered By: Baruch Gouty on 10/14/2021 10:31:59 -------------------------------------------------------------------------------- Wound Assessment Details Patient Name: Date of Service: [name], [name] 10/14/2021 9:00 A M Medical Record Number: [id number] Patient Account Number: [id number] Date of Birth/Sex: Treating RN: November 11, 1960 (61 y.o. Female) Baruch Gouty Primary Care Cohan Stipes: Cassandria Anger Other Clinician: Referring Hieu Herms: Treating Cayla Wiegand/Extender: Kelby Aline in Treatment: 0 Wound Status Wound Number: 8 Primary Inflammatory Etiology: Wound Location: Left, Medial Lower Leg Wound Open Wounding Event: Bump Status: Date Acquired: 08/24/2021 Comorbid Cataracts, Anemia, Hypertension, Vasculitis, Osteoarthritis, Weeks Of Treatment: 0 History: Anorexia/bulimia, Confinement Anxiety Clustered Wound: No Photos Wound Measurements Length: (cm) 2.2 Width: (cm) 2 Depth: (cm) 0.3 Area: (cm) 3.456 Volume: (cm) 1.037 % Reduction in Area: 0% % Reduction in Volume: 0% Epithelialization: Small (1-33%) Tunneling: No Undermining: No Wound Description Classification: Full Thickness Without Exposed Support Structures Exudate Amount: Medium Exudate Type: Serous Exudate Color: amber Foul Odor After Cleansing: No Wound Bed Granulation Amount: Medium (34-66%) Exposed Structure Granulation Quality: Pink Fascia Exposed: No Necrotic Amount: Medium (34-66%) Fat Layer (Subcutaneous Tissue) Exposed: Yes Necrotic Quality: Adherent Slough Tendon Exposed: No Muscle Exposed: No Joint Exposed: No Bone Exposed: No Treatment Notes Wound #8 (Lower  Leg) Wound Laterality: Left, Medial Cleanser Peri-Wound Care Topical Primary Dressing Santyl Ointment Discharge Instruction: Apply nickel thick amount to wound bed as instructed Secondary Dressing Zetuvit  Plus Silicone Border Dressing 4x4 (in/in) Discharge Instruction: Apply silicone border over primary dressing as directed. Secured With Elastic Bandage 4 inch (ACE bandage) Discharge Instruction: Secure with ACE bandage as directed. Compression Wrap Compression Stockings Add-Ons Electronic Signature(s) Signed: 10/14/2021 5:47:34 PM By: Baruch Gouty RN, BSN Entered By: Baruch Gouty on 10/14/2021 10:32:54 -------------------------------------------------------------------------------- Vitals Details Patient Name: Date of Service: [name], [name] 10/14/2021 9:00 A M Medical Record Number: [id number] Patient Account Number: [id number] Date of Birth/Sex: Treating RN: 1960/09/22 (61 y.o. Female) Baruch Gouty Primary Care Shaneal Barasch: Cassandria Anger Other Clinician: Referring Dantrell Schertzer: Treating Adom Schoeneck/Extender: Kelby Aline in Treatment: 0 Vital Signs Time Taken: 09:19 Temperature (F): 99.1 Height (in): 65 Pulse (bpm): 85 Source: Stated Respiratory Rate (breaths/min): 18 Blood Pressure (mmHg): 107/71 Reference Range: 80 - 120 mg / dl Electronic Signature(s) Signed: 10/14/2021 5:47:34 PM By: Baruch Gouty RN, BSN Entered By: Baruch Gouty on 10/14/2021 09:28:02

## 2021-10-14 NOTE — Progress Notes (Signed)
Jessica Lynn, Jessica Lynn ([id number]) Visit Report for 10/14/2021 Abuse Risk Screen Details Patient Name: Date of Service: Jessica [facility] [place], [name] 10/14/2021 9:00 A M Medical Record Number: [id number] Patient Account Number: [id number] Date of Birth/Sex: Treating RN: [birth year] (61 y.o. Female) Baruch Gouty Primary Care Alianys Chacko: Cassandria Anger Other Clinician: Referring Chastity Noland: Treating Derreck Wiltsey/Extender: Kelby Aline in Treatment: 0 Abuse Risk Screen Items Answer ABUSE RISK SCREEN: Has anyone close to you tried to hurt or harm you recentlyo No Do you feel uncomfortable with anyone in your familyo No Has anyone forced you do things that you didnt want to doo No Electronic Signature(s) Signed: 10/14/2021 5:47:34 PM By: Baruch Gouty RN, BSN Entered By: Baruch Gouty on 10/14/2021 09:40:45 -------------------------------------------------------------------------------- Activities of Daily Living Details Patient Name: Date of Service: Jessica [place], [name] 10/14/2021 9:00 A M Medical Record Number: [id number] Patient Account Number: [id number] Date of Birth/Sex: Treating RN: [birth year] (61 y.o. Female) Baruch Gouty Primary Care Jeanice Dempsey: Cassandria Anger Other Clinician: Referring Tasharra Nodine: Treating Carmel Waddington/Extender: Kelby Aline in Treatment: 0 Activities of Daily Living Items Answer Activities of Daily Living (Please select one for each item) Drive Automobile Completely Able T Medications ake Completely Able Use T elephone Completely Able Care for Appearance Completely Able Use T oilet Completely Able Bath / Shower Completely Able Dress Self Completely Able Feed Self Completely Able Walk Completely Able Get In / Out Bed Completely Able Housework Completely Able Prepare Meals Completely [place] Completely Able Shop for Self Completely Able Electronic Signature(s) Signed: 10/14/2021 5:47:34 PM By:  Baruch Gouty RN, BSN Entered By: Baruch Gouty on 10/14/2021 09:41:04 -------------------------------------------------------------------------------- Education Screening Details Patient Name: Date of Service: Jessica [place], [name] 10/14/2021 9:00 A M Medical Record Number: [id number] Patient Account Number: [id number] Date of Birth/Sex: Treating RN: 1960/07/19 (61 y.o. Female) Baruch Gouty Primary Care Ryian Lynde: Cassandria Anger Other Clinician: Referring Donnalee Cellucci: Treating Aleshia Cartelli/Extender: Kelby Aline in Treatment: 0 Primary Learner Assessed: Patient Learning Preferences/Education Level/Primary Language Learning Preference: Explanation, Demonstration, Printed Material Highest Education Level: High School Preferred Language: English Cognitive Barrier Language Barrier: No Translator Needed: No Memory Deficit: No Emotional Barrier: No Cultural/Religious Beliefs Affecting Medical Care: No Physical Barrier Impaired Vision: Yes Glasses Impaired Hearing: No Decreased Hand dexterity: No Knowledge/Comprehension Knowledge Level: High Comprehension Level: High Ability to understand written instructions: High Ability to understand verbal instructions: High Motivation Anxiety Level: Calm Cooperation: Cooperative Education Importance: Acknowledges Need Interest in Health Problems: Asks Questions Perception: Coherent Willingness to Engage in Self-Management High Activities: Readiness to Engage in Self-Management High Activities: Electronic Signature(s) Signed: 10/14/2021 5:47:34 PM By: Baruch Gouty RN, BSN Entered By: Baruch Gouty on 10/14/2021 09:41:39 -------------------------------------------------------------------------------- Fall Risk Assessment Details Patient Name: Date of Service: Jessica [place], [place]. 10/14/2021 9:00 A M Medical Record Number: [id number] Patient Account Number: [id number] Date of Birth/Sex: Treating  RN: 1961-03-22 (61 y.o. Female) Baruch Gouty Primary Care Jessica [place]: Cassandria Anger Other Clinician: Referring Jessica [place]: Treating Jessica Lynn/Extender: Kelby Aline in Treatment: 0 Fall Risk Assessment Items Have you had 2 or more falls in the last 12 monthso 0 No Have you had any fall that resulted in injury in the last 12 monthso 0 No FALLS RISK SCREEN History of falling - immediate or within 3 months 0 No Secondary diagnosis (Do you have 2 or more medical diagnoseso) 0 No Ambulatory aid None/bed rest/wheelchair/nurse 0 Yes Crutches/cane/walker 0 No Furniture 0 No  Intravenous therapy Access/Saline/Heparin Lock 0 No Gait/Transferring Normal/ bed rest/ wheelchair 0 Yes Weak (short steps with or without shuffle, stooped but able to lift head while walking, may seek 0 No support from furniture) Impaired (short steps with shuffle, may have difficulty arising from chair, head down, impaired 0 No balance) Mental Status Oriented to own ability 0 Yes Electronic Signature(s) Signed: 10/14/2021 5:47:34 PM By: Baruch Gouty RN, BSN Entered By: Baruch Gouty on 10/14/2021 09:42:13 -------------------------------------------------------------------------------- Foot Assessment Details Patient Name: Date of Service: Jessica [place], [name] 10/14/2021 9:00 A M Medical Record Number: [id number] Patient Account Number: [id number] Date of Birth/Sex: Treating RN: May 27, 1960 (61 y.o. Female) Baruch Gouty Primary Care Jessica [place]: Cassandria Anger Other Clinician: Referring Murle Hellstrom: Treating Ilissa Rosner/Extender: Kelby Aline in Treatment: 0 Foot Assessment Items Site Locations + = Sensation present, - = Sensation absent, C = Callus, U = Ulcer R = Redness, W = Warmth, M = Maceration, PU = Pre-ulcerative lesion F = Fissure, S = Swelling, D = Dryness Assessment Right: Left: Other Deformity: No No Prior Foot Ulcer: No  No Prior Amputation: No No Charcot Joint: No No Ambulatory Status: Ambulatory Without Help Gait: Steady Electronic Signature(s) Signed: 10/14/2021 5:47:34 PM By: Baruch Gouty RN, BSN Entered By: Baruch Gouty on 10/14/2021 09:47:59 -------------------------------------------------------------------------------- Nutrition Risk Screening Details Patient Name: Date of Service: Jessica [place], [place]. 10/14/2021 9:00 A M Medical Record Number: [id number] Patient Account Number: [id number] Date of Birth/Sex: Treating RN: Jun 07, 1960 (61 y.o. Female) Baruch Gouty Primary Care Aldwin Micalizzi: Cassandria Anger Other Clinician: Referring Charlissa Petros: Treating Chetan Mehring/Extender: Kelby Aline in Treatment: 0 Height (in): 65 Weight (lbs): Body Mass Index (BMI): Nutrition Risk Screening Items Score Screening NUTRITION RISK SCREEN: I have an illness or condition that made me change the kind and/or amount of food I eat 0 No I eat fewer than two meals per day 3 Yes I eat few fruits and vegetables, or milk products 2 Yes I have three or more drinks of beer, liquor or wine almost every day 0 No I have tooth or mouth problems that make it hard for me to eat 0 No I don't always have enough money to buy the food I need 0 No I eat alone most of the time 0 No I take three or more different prescribed or over-the-counter drugs a day 1 Yes Without wanting to, I have lost or gained 10 pounds in the last six months 2 Yes I am not always physically able to shop, cook and/or feed myself 0 No Nutrition Protocols Good Risk Protocol Moderate Risk Protocol High Risk Proctocol 0 Provide education on nutrition Risk Level: High Risk Score: 8 Electronic Signature(s) Signed: 10/14/2021 5:47:34 PM By: Baruch Gouty RN, BSN Entered By: Baruch Gouty on 10/14/2021 09:43:13

## 2021-10-14 NOTE — Progress Notes (Signed)
Jessica Lynn, Jessica Lynn ([id number]) Visit Report for 10/14/2021 Problem List Details Patient Name: Date of Service: [facility] [name], [name] 10/14/2021 9:00 A M Medical Record Number: [id number] Patient Account Number: [id number] Date of Birth/Sex: Treating RN: 08-05-1960 (61 y.o. 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

## 2021-10-15 ENCOUNTER — Other Ambulatory Visit: Payer: Self-pay | Admitting: Internal Medicine

## 2021-10-17 ENCOUNTER — Other Ambulatory Visit: Payer: Self-pay | Admitting: Internal Medicine

## 2021-10-17 ENCOUNTER — Ambulatory Visit (INDEPENDENT_AMBULATORY_CARE_PROVIDER_SITE_OTHER): Payer: BC Managed Care – PPO | Admitting: Internal Medicine

## 2021-10-17 ENCOUNTER — Encounter: Payer: Self-pay | Admitting: Internal Medicine

## 2021-10-17 DIAGNOSIS — M159 Polyosteoarthritis, unspecified: Secondary | ICD-10-CM

## 2021-10-17 DIAGNOSIS — M545 Low back pain, unspecified: Secondary | ICD-10-CM

## 2021-10-17 DIAGNOSIS — L959 Vasculitis limited to the skin, unspecified: Secondary | ICD-10-CM

## 2021-10-17 DIAGNOSIS — M79606 Pain in leg, unspecified: Secondary | ICD-10-CM

## 2021-10-17 DIAGNOSIS — H35063 Retinal vasculitis, bilateral: Secondary | ICD-10-CM | POA: Diagnosis not present

## 2021-10-17 DIAGNOSIS — R609 Edema, unspecified: Secondary | ICD-10-CM | POA: Diagnosis not present

## 2021-10-17 MED ORDER — HYDROCODONE-ACETAMINOPHEN 5-325 MG PO TABS
1.0000 | ORAL_TABLET | Freq: Three times a day (TID) | ORAL | 0 refills | Status: DC | PRN
Start: 2021-10-17 — End: 2021-11-14

## 2021-10-17 MED ORDER — PREDNISONE 10 MG PO TABS: ORAL_TABLET | ORAL | 1 refills | Status: DC

## 2021-10-17 MED ORDER — HYDROCODONE-ACETAMINOPHEN 5-325 MG PO TABS
1.0000 | ORAL_TABLET | Freq: Three times a day (TID) | ORAL | 0 refills | Status: DC | PRN
Start: 2021-10-17 — End: 2021-10-29

## 2021-10-17 NOTE — Patient Instructions (Signed)
Take Prednisone 20 mg with breakfast and 10 mg with lunch x 1 week, then 20 mg with breakfast x 1 week, then 10 mg with breakfast

## 2021-10-17 NOTE — Assessment & Plan Note (Signed)
S/p Ophth f/u

## 2021-10-17 NOTE — Assessment & Plan Note (Addendum)
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 Dr Glenford Peers help is very appreciated On abx Will reduce steroids. Take Prednisone 20 mg with breakfast and 10 mg with lunch x 1 week, then 20 mg q am x 1 week, then 10 mg q am pc

## 2021-10-17 NOTE — Progress Notes (Signed)
Subjective:  Patient ID: Jessica Lynn, female    DOB: 1960-09-12  Age: 61 y.o. MRN: [id number]  CC: Office Visit (Discuss medications; patient c/o being nervous, jittery, tingling, lightheaded, dizziness, occasional SOB, lack of energy, "awful" taste in mouth)   HPI Jessica Lynn presents for LE wounds - on Augmentin now C/o tremor, bad taste, jitteriness Patient c/o being nervous, jittery, tingling, lightheaded, dizziness, occasional SOB, lack of energy, "awful" taste in mouth...   Outpatient Medications Prior to Visit  Medication Sig Dispense Refill   amoxicillin-clavulanate (AUGMENTIN) 875-125 MG tablet Take 1 tablet by mouth 2 (two) times daily.     Cholecalciferol (VITAMIN D3) 2000 units capsule Take 1 capsule (2,000 Units total) by mouth daily. 100 capsule 3   dorzolamide-timolol (COSOPT) 22.3-6.8 MG/ML ophthalmic solution 1 drop 2 (two) times daily. 1 drop 2 (two) times daily     folic acid (FOLVITE) 1 MG tablet TAKE 1 TABLET(1 MG) BY MOUTH DAILY 100 tablet 3   furosemide (LASIX) 40 MG tablet Take 1 tablet (40 mg total) by mouth daily. 30 tablet 5   hydrOXYzine (ATARAX/VISTARIL) 25 MG tablet TAKE 1 TABLET BY MOUTH EVERY 8 HOURS AS NEEDED FOR ITCHING 270 tablet 1   KLOR-CON M20 20 MEQ tablet TAKE 1 TABLET BY MOUTH EVERY DAY 90 tablet 1   montelukast (SINGULAIR) 10 MG tablet TAKE 1 TABLET(10 MG) BY MOUTH DAILY 90 tablet 3   naproxen (NAPROSYN) 500 MG tablet Take 500 mg by mouth 2 (two) times daily.     prednisoLONE acetate (PRED FORTE) 1 % ophthalmic suspension 1 drop 4 (four) times daily. 1 drop 4 (four) times daily.     triamcinolone cream (KENALOG) 0.1 % Apply 1 application. topically 4 (four) times daily. 450 g 3   valsartan-hydrochlorothiazide (DIOVAN-HCT) 160-12.5 MG tablet TAKE 1 TABLET BY MOUTH DAILY 30 tablet 11   zolpidem (AMBIEN) 10 MG tablet Take 1 tablet (10 mg total) by mouth at bedtime as needed for sleep. 30 tablet 2   HYDROcodone-acetaminophen (NORCO/VICODIN)  5-325 MG tablet Take 1 tablet by mouth 3 (three) times daily as needed for severe pain. 90 tablet 0   HYDROcodone-acetaminophen (NORCO/VICODIN) 5-325 MG tablet Take 1 tablet by mouth 3 (three) times daily as needed for severe pain. 90 tablet 0   predniSONE (DELTASONE) 10 MG tablet Take 30 mg with breakfast and 20 mg with lunch 180 tablet 1   brimonidine-timolol (COMBIGAN) 0.2-0.5 % ophthalmic solution Place 1 drop into both eyes 2 times daily. (Patient not taking: Reported on 10/17/2021)     clonazePAM (KLONOPIN) 1 MG tablet Take 1/2 to 1  Tablet by mouth two times daily as needed for nerves (Patient not taking: Reported on 10/17/2021) 60 tablet 3   pantoprazole (PROTONIX) 40 MG tablet Take 1 tablet (40 mg total) by mouth daily. (Patient not taking: Reported on 10/17/2021) 30 tablet 5   pravastatin (PRAVACHOL) 20 MG tablet TAKE 1 TABLET(20 MG) BY MOUTH DAILY (Patient not taking: Reported on 10/17/2021) 90 tablet 3   SANTYL 250 UNIT/GM ointment Apply topically. (Patient not taking: Reported on 10/17/2021)     No facility-administered medications prior to visit.    ROS: Review of Systems  Constitutional:  Positive for fatigue. Negative for activity change, appetite change, chills, diaphoresis and unexpected weight change.  HENT:  Negative for congestion, mouth sores, rhinorrhea and sinus pressure.   Eyes:  Negative for visual disturbance.  Respiratory:  Negative for cough and chest tightness.   Gastrointestinal:  Negative  for abdominal pain and nausea.  Genitourinary:  Positive for frequency. Negative for difficulty urinating and vaginal pain.  Musculoskeletal:  Positive for arthralgias, back pain and gait problem.  Skin:  Positive for color change and wound. Negative for pallor and rash.  Neurological:  Positive for dizziness and light-headedness. Negative for tremors, weakness, numbness and headaches.  Psychiatric/Behavioral:  Negative for confusion and sleep disturbance. The patient is  nervous/anxious.     Objective:  BP 118/70 (BP Location: Left Arm, Patient Position: Sitting, Cuff Size: Normal)   Pulse 61   Temp 99 F (37.2 C) (Oral)   Ht '5\' 5"'$  (1.651 m)   Wt 173 lb (78.5 kg)   LMP 06/06/2015 Comment: irregular   SpO2 100%   BMI 28.79 kg/m   BP Readings from Last 3 Encounters:  10/17/21 118/70  09/26/21 118/74  09/16/21 122/70    Wt Readings from Last 3 Encounters:  10/17/21 173 lb (78.5 kg)  09/26/21 175 lb (79.4 kg)  09/16/21 186 lb (84.4 kg)    Physical Exam Constitutional:      General: She is not in acute distress.    Appearance: She is well-developed.  HENT:     Head: Normocephalic.     Right Ear: External ear normal.     Left Ear: External ear normal.     Nose: Nose normal.  Eyes:     General:        Right eye: No discharge.        Left eye: No discharge.     Conjunctiva/sclera: Conjunctivae normal.     Pupils: Pupils are equal, round, and reactive to light.  Neck:     Thyroid: No thyromegaly.     Vascular: No JVD.     Trachea: No tracheal deviation.  Cardiovascular:     Rate and Rhythm: Normal rate and regular rhythm.     Heart sounds: Normal heart sounds.  Pulmonary:     Effort: No respiratory distress.     Breath sounds: No stridor. No wheezing.  Abdominal:     General: Bowel sounds are normal. There is no distension.     Palpations: Abdomen is soft. There is no mass.     Tenderness: There is no abdominal tenderness. There is no guarding or rebound.  Musculoskeletal:        General: Tenderness present.     Cervical back: Normal range of motion and neck supple. No rigidity.     Right lower leg: No edema.     Left lower leg: No edema.  Lymphadenopathy:     Cervical: No cervical adenopathy.  Skin:    Findings: No erythema or rash.  Neurological:     Mental Status: She is oriented to person, place, and time.     Cranial Nerves: No cranial nerve deficit.     Motor: No abnormal muscle tone.     Coordination: Coordination  normal.     Gait: Gait abnormal.     Deep Tendon Reflexes: Reflexes normal.  Psychiatric:        Behavior: Behavior normal.        Thought Content: Thought content normal.        Judgment: Judgment normal.   Antalgic gait No edema, wounds are better per pt (LEs are dressed)  Lab Results  Component Value Date   WBC 10.4 09/16/2021   HGB 9.5 Repeated and verified X2. (L) 09/16/2021   HCT 27.2 (L) 09/16/2021   PLT 161.0 09/16/2021   GLUCOSE  88 09/16/2021   CHOL 148 12/16/2019   TRIG 71 12/16/2019   HDL 70 12/16/2019   LDLDIRECT 160.7 02/06/2012   LDLCALC 63 12/16/2019   ALT 33 09/16/2021   AST 23 09/16/2021   NA 138 09/16/2021   K 3.8 09/16/2021   CL 102 09/16/2021   CREATININE 0.94 09/16/2021   BUN 18 09/16/2021   CO2 29 09/16/2021   TSH 1.65 09/16/2021   HGBA1C 5.5 09/16/2021    No results found.  Assessment & Plan:   Problem List Items Addressed This Visit     Edema    Resolved Cont on Furosemide prn      Low back pain radiating down leg    Cont w/Norco prn  Potential benefits of a long term opioids use as well as potential risks (i.e. addiction risk, apnea etc) and complications (i.e. Somnolence, constipation and others) were explained to the patient and were aknowledged.      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 Dr Glenford Peers help is very appreciated On abx Will reduce steroids. Take Prednisone 20 mg with breakfast and 10 mg with lunch x 1 week, then 20 mg q am x 1 week, then 10  mg q am pc          Meds ordered this encounter  Medications   HYDROcodone-acetaminophen (NORCO/VICODIN) 5-325 MG tablet    Sig: Take 1 tablet by mouth 3 (three) times daily as needed for severe pain.    Dispense:  90 tablet    Refill:  0    Please fill on or after 10/26/21.  Office visit every 3 months   HYDROcodone-acetaminophen (NORCO/VICODIN) 5-325 MG tablet    Sig: Take 1 tablet by mouth 3 (three) times daily as needed for severe pain.    Dispense:  90 tablet    Refill:  0    Please fill on or after 11/25/21.  Office visit every 3 months   predniSONE (DELTASONE) 10 MG tablet    Sig: Take 20 mg with breakfast and 10 mg with lunch x 1 week, then 20 mg q am x 1 week, then 10 mg q am pc    Dispense:  180 tablet    Refill:  1      Follow-up: Return in about 4 weeks (around 11/14/2021) for a follow-up visit.  Walker Kehr, MD

## 2021-10-17 NOTE — Assessment & Plan Note (Signed)
Resolved Cont on Furosemide prn

## 2021-10-17 NOTE — Assessment & Plan Note (Signed)
Cont w/Norco prn  Potential benefits of a long term opioids use as well as potential risks (i.e. addiction risk, apnea etc) and complications (i.e. Somnolence, constipation and others) were explained to the patient and were aknowledged. 

## 2021-10-22 ENCOUNTER — Encounter (HOSPITAL_BASED_OUTPATIENT_CLINIC_OR_DEPARTMENT_OTHER): Payer: BC Managed Care – PPO | Admitting: General Surgery

## 2021-10-22 ENCOUNTER — Other Ambulatory Visit (HOSPITAL_COMMUNITY): Payer: Self-pay | Admitting: General Surgery

## 2021-10-22 ENCOUNTER — Other Ambulatory Visit: Payer: Self-pay | Admitting: General Surgery

## 2021-10-22 DIAGNOSIS — R52 Pain, unspecified: Secondary | ICD-10-CM

## 2021-10-22 DIAGNOSIS — L97812 Non-pressure chronic ulcer of other part of right lower leg with fat layer exposed: Secondary | ICD-10-CM | POA: Diagnosis not present

## 2021-10-22 NOTE — Progress Notes (Signed)
Jessica Lynn ([id number]) Visit Report for 10/22/2021 Arrival Information Details Patient Name: Date of Service: Jessica Lynn, [name] 10/22/2021 8:15 A M Medical Record Number: [id number] Patient Account Number: [id number] Date of Birth/Sex: Treating RN: Jessica Lynn (61 y.o. Martyn Malay, Vaughan Basta Primary Care Keaira Whitehurst: Cassandria Anger Other Clinician: Referring Aahan Marques: Treating Audrionna Lampton/Extender: Kelby Aline in Treatment: 1 Visit Information History Since Last Visit Added or deleted any medications: No Patient Arrived: Ambulatory Any new allergies or adverse reactions: No Arrival Time: 08:14 Had a fall or experienced change in Yes Accompanied By: self activities of daily living that may affect Transfer Assistance: None risk of falls: Patient Identification Verified: Yes Signs or symptoms of abuse/neglect since last visito No Secondary Verification Process Completed: Yes Hospitalized since last visit: No Patient Requires Transmission-Based Precautions: No Implantable device outside of the clinic excluding No Patient Has Alerts: No cellular tissue based products placed in the center since last visit: Has Dressing in Place as Prescribed: Yes Has Compression in Place as Prescribed: Yes Pain Present Now: Yes Electronic Signature(s) Signed: 10/22/2021 5:31:58 PM By: Baruch Gouty RN, BSN Entered By: Baruch Gouty on 10/22/2021 08:22:42 -------------------------------------------------------------------------------- Encounter Discharge Information Details Patient Name: Date of Service: Jessica Lynn, [place]. 10/22/2021 8:15 A M Medical Record Number: [id number] Patient Account Number: [id number] Date of Birth/Sex: Treating RN: [birth year] (61 y.o. Jessica Lynn Primary Care Aowyn Rozeboom: Cassandria Anger Other Clinician: Referring Kamile Fassler: Treating Adysen Raphael/Extender: Kelby Aline in Treatment: 1 Encounter  Discharge Information Items Post Procedure Vitals Discharge Condition: Stable Temperature (F): 97.6 Ambulatory Status: Ambulatory Pulse (bpm): 84 Discharge Destination: Home Respiratory Rate (breaths/min): 18 Transportation: Private Auto Blood Pressure (mmHg): 100/66 Accompanied By: self Schedule Follow-up Appointment: Yes Clinical Summary of Care: Patient Declined Electronic Signature(s) Signed: 10/22/2021 5:31:58 PM By: Baruch Gouty RN, BSN Entered By: Baruch Gouty on 10/22/2021 09:33:22 -------------------------------------------------------------------------------- Lower Extremity Assessment Details Patient Name: Date of Service: Jessica Lynn, [name] 10/22/2021 8:15 A M Medical Record Number: [id number] Patient Account Number: [id number] Date of Birth/Sex: Treating RN: Jessica Lynn (61 y.o. Jessica Lynn Primary Care Lindamarie Maclachlan: Cassandria Anger Other Clinician: Referring Nykia Turko: Treating Quame Spratlin/Extender: Kelby Aline in Treatment: 1 Edema Assessment Assessed: [Left: No] [Right: No] Edema: [Left: No] [Right: No] Calf Left: Right: Point of Measurement: From Medial Instep 33 cm 32.8 cm Ankle Left: Right: Point of Measurement: From Medial Instep 20.5 cm 20 cm Vascular Assessment Pulses: Dorsalis Pedis Palpable: [Left:Yes] [Right:Yes] Electronic Signature(s) Signed: 10/22/2021 5:31:58 PM By: Baruch Gouty RN, BSN Entered By: Baruch Gouty on 10/22/2021 08:48:31 -------------------------------------------------------------------------------- Multi Wound Chart Details Patient Name: Date of Service: Jessica Lynn, Jessica Lynn. 10/22/2021 8:15 A M Medical Record Number: [id number] Patient Account Number: [id number] Date of Birth/Sex: Treating RN: Jessica Lynn (61 y.o. Jessica Lynn Primary Care Matheus Spiker: Cassandria Anger Other Clinician: Referring Dominick Morella: Treating Abdulraheem Pineo/Extender: Kelby Aline in Treatment: 1 Vital Signs Height(in): 65 Pulse(bpm): 32 Weight(lbs): Blood Pressure(mmHg): 100/66 Body Mass Index(BMI): Temperature(F): 97.6 Respiratory Rate(breaths/min): 18 Photos: Left Upper Leg Right, Medial Lower Leg Right, Distal, Medial Lower Leg Wound Location: Bump Bump Bump Wounding Event: Inflammatory Inflammatory Inflammatory Primary Etiology: Cataracts, Anemia, Hypertension, Cataracts, Anemia, Hypertension, Cataracts, Anemia, Hypertension, Comorbid History: Vasculitis, Osteoarthritis, Vasculitis, Osteoarthritis, Vasculitis, Osteoarthritis, Anorexia/bulimia, Confinement Anxiety Anorexia/bulimia, Confinement Anxiety Anorexia/bulimia, Confinement Anxiety 08/24/2021 08/24/2021 08/24/2021 Date Acquired: '1 1 1 '$ Weeks of Treatment: Open Open Open Wound Status: No No No Wound Recurrence: No No  No Clustered Wound: N/A N/A N/A Clustered Quantity: 0.4x0.9x0.1 1.3x1.2x0.1 0.7x0.4x0.1 Measurements L x W x D (cm) 0.283 1.225 0.22 A (cm) : rea 0.028 0.123 0.022 Volume (cm) : 64.70% 20.00% 37.70% % Reduction in A rea: 65.00% 19.60% 37.10% % Reduction in Volume: Full Thickness Without Exposed Full Thickness Without Exposed Full Thickness Without Exposed Classification: Support Structures Support Structures Support Structures Medium Medium Medium Exudate A mount: Serous Serous Serous Exudate Type: Geophysical data processor Exudate Color: Distinct, outline attached Distinct, outline attached Distinct, outline attached Wound Margin: Large (67-100%) Medium (34-66%) Large (67-100%) Granulation A mount: Red, Pink Red, Pink Red, Pink Granulation Quality: Small (1-33%) Medium (34-66%) Small (1-33%) Necrotic A mount: Fat Layer (Subcutaneous Tissue): Yes Fat Layer (Subcutaneous Tissue): Yes Fat Layer (Subcutaneous Tissue): Yes Exposed Structures: Fascia: No Fascia: No Fascia: No Tendon: No Tendon: No Tendon: No Muscle: No Muscle: No Muscle: No Joint:  No Joint: No Joint: No Bone: No Bone: No Bone: No Medium (34-66%) None Small (1-33%) Epithelialization: N/A Debridement - Excisional Debridement - Excisional Debridement: Pre-procedure Verification/Time Out N/A 09:00 09:00 Taken: N/A Lidocaine 4% Topical Solution Lidocaine 4% Topical Solution Pain Control: N/A Subcutaneous, Slough Subcutaneous, Slough Tissue Debrided: N/A Skin/Subcutaneous Tissue Skin/Subcutaneous Tissue Level: N/A 1.56 0.28 Debridement A (sq cm): rea N/A Curette Curette Instrument: N/A Minimum Minimum Bleeding: N/A Pressure Pressure Hemostasis A chieved: N/A 5 5 Procedural Pain: N/A 3 3 Post Procedural Pain: N/A Procedure was tolerated well Procedure was tolerated well Debridement Treatment Response: N/A 1.3x1.2x0.1 0.7x0.4x0.1 Post Debridement Measurements L x W x D (cm) N/A 0.123 0.022 Post Debridement Volume: (cm) N/A Debridement Debridement Procedures Performed: Wound Number: '4 5 6 '$ Photos: Left, Anterior Lower Leg Left, Distal, Anterior Lower Leg Left, Lateral Lower Leg Wound Location: Bump Bump Bump Wounding Event: Inflammatory Inflammatory Inflammatory Primary Etiology: Cataracts, Anemia, Hypertension, Cataracts, Anemia, Hypertension, Cataracts, Anemia, Hypertension, Comorbid History: Vasculitis, Osteoarthritis, Vasculitis, Osteoarthritis, Vasculitis, Osteoarthritis, Anorexia/bulimia, Confinement Anxiety Anorexia/bulimia, Confinement Anxiety Anorexia/bulimia, Confinement Anxiety 08/24/2021 08/24/2021 08/24/2021 Date Acquired: '1 1 1 '$ Weeks of Treatment: Open Open Open Wound Status: No No No Wound Recurrence: Yes No No Clustered Wound: 2 N/A N/A Clustered Quantity: 5.3x2x0.1 2.3x2.3x0.2 1.9x1.3x0.1 Measurements L x W x D (cm) 8.325 4.155 1.94 A (cm) : rea 0.833 0.831 0.194 Volume (cm) : 1.90% -33.60% 7.10% % Reduction in Area: 67.30% -33.60% 53.60% % Reduction in Volume: Full Thickness Without Exposed Full Thickness  Without Exposed Full Thickness Without Exposed Classification: Support Structures Support Structures Support Structures Medium Medium Medium Exudate Amount: Serous Serous Serous Exudate Type: Geophysical data processor Exudate Color: Distinct, outline attached Distinct, outline attached Distinct, outline attached Wound Margin: Medium (34-66%) Medium (34-66%) Medium (34-66%) Granulation Amount: Red, Pink Red, Pink Red Granulation Quality: Medium (34-66%) Medium (34-66%) Medium (34-66%) Necrotic Amount: Fat Layer (Subcutaneous Tissue): Yes Fat Layer (Subcutaneous Tissue): Yes Fat Layer (Subcutaneous Tissue): Yes Exposed Structures: Fascia: No Fascia: No Fascia: No Tendon: No Tendon: No Tendon: No Muscle: No Muscle: No Muscle: No Joint: No Joint: No Joint: No Bone: No Bone: No Bone: No Small (1-33%) None None Epithelialization: Debridement - Excisional Debridement - Excisional Debridement - Excisional Debridement: Pre-procedure Verification/Time Out 09:00 09:00 09:00 Taken: Lidocaine 4% Topical Solution Lidocaine 4% Topical Solution Lidocaine 4% Topical Solution Pain Control: Subcutaneous, Slough Subcutaneous, Slough Subcutaneous, Slough Tissue Debrided: Skin/Subcutaneous Tissue Skin/Subcutaneous Tissue Skin/Subcutaneous Tissue Level: 8 5.29 2.47 Debridement A (sq cm): rea Curette Curette Curette Instrument: Minimum Minimum Minimum Bleeding: Pressure Pressure Pressure Hemostasis A chieved: '5 5 5 '$ Procedural Pain: 3  3 3 Post Procedural Pain: Procedure was tolerated well Procedure was tolerated well Procedure was tolerated well Debridement Treatment Response: 5.3x2x0.1 2.3x2.3x0.2 1.9x1.3x0.1 Post Debridement Measurements L x W x D (cm) 0.833 0.831 0.194 Post Debridement Volume: (cm) Debridement Debridement Debridement Procedures Performed: Wound Number: 7 8 N/A Photos: N/A Left, Lateral, Anterior Lower Leg Left, Medial Lower Leg N/A Wound Location: Bump Bump  N/A Wounding Event: Inflammatory Inflammatory N/A Primary Etiology: Cataracts, Anemia, Hypertension, Cataracts, Anemia, Hypertension, N/A Comorbid History: Vasculitis, Osteoarthritis, Vasculitis, Osteoarthritis, Anorexia/bulimia, Confinement Anxiety Anorexia/bulimia, Confinement Anxiety 08/24/2021 08/24/2021 N/A Date Acquired: 1 1 N/A Weeks of Treatment: Open Open N/A Wound Status: No No N/A Wound Recurrence: No No N/A Clustered Wound: N/A N/A N/A Clustered Quantity: 1.2x0.8x0.1 2x1.9x0.2 N/A Measurements L x W x D (cm) 0.754 2.985 N/A A (cm) : rea 0.075 0.597 N/A Volume (cm) : -33.50% 13.60% N/A % Reduction in A rea: -31.60% 42.40% N/A % Reduction in Volume: Full Thickness Without Exposed Full Thickness Without Exposed N/A Classification: Support Structures Support Structures Medium Medium N/A Exudate A mount: Serous Serous N/A Exudate Type: Physiological scientist N/A Exudate Color: Distinct, outline attached Distinct, outline attached N/A Wound Margin: Medium (34-66%) Large (67-100%) N/A Granulation A mount: Red Red, Pink N/A Granulation Quality: Medium (34-66%) Small (1-33%) N/A Necrotic A mount: Fat Layer (Subcutaneous Tissue): Yes Fat Layer (Subcutaneous Tissue): Yes N/A Exposed Structures: Fascia: No Fascia: No Tendon: No Tendon: No Muscle: No Muscle: No Joint: No Joint: No Bone: No Bone: No None Small (1-33%) N/A Epithelialization: Debridement - Excisional Debridement - Excisional N/A Debridement: Pre-procedure Verification/Time Out 09:00 09:00 N/A Taken: Lidocaine 4% Topical Solution Lidocaine 4% Topical Solution N/A Pain Control: Subcutaneous, Slough Subcutaneous, Slough N/A Tissue Debrided: Skin/Subcutaneous Tissue Skin/Subcutaneous Tissue N/A Level: 0.96 3.8 N/A Debridement A (sq cm): rea Curette Curette N/A Instrument: Minimum Minimum N/A Bleeding: Pressure Pressure N/A Hemostasis A chieved: 5 5 N/A Procedural Pain: 3 3 N/A Post  Procedural Pain: Procedure was tolerated well Procedure was tolerated well N/A Debridement Treatment Response: 1.2x0.8x0.1 2x1.9x0.2 N/A Post Debridement Measurements L x W x D (cm) 0.075 0.597 N/A Post Debridement Volume: (cm) Debridement Debridement N/A Procedures Performed: Treatment Notes Wound #1 (Upper Leg) Wound Laterality: Left Cleanser Peri-Wound Care Topical Primary Dressing Santyl Ointment Discharge Instruction: Apply nickel thick amount to wound bed as instructed Secondary Dressing Zetuvit Plus Silicone Border Dressing 4x4 (in/in) Discharge Instruction: Apply silicone border over primary dressing as directed. Secured With Elastic Bandage 4 inch (ACE bandage) Discharge Instruction: Secure with ACE bandage as directed. Compression Wrap Compression Stockings Add-Ons Wound #2 (Lower Leg) Wound Laterality: Right, Medial Cleanser Peri-Wound Care Topical Primary Dressing Santyl Ointment Discharge Instruction: Apply nickel thick amount to wound bed as instructed Secondary Dressing Zetuvit Plus Silicone Border Dressing 4x4 (in/in) Discharge Instruction: Apply silicone border over primary dressing as directed. Secured With Elastic Bandage 4 inch (ACE bandage) Discharge Instruction: Secure with ACE bandage as directed. Compression Wrap Compression Stockings Add-Ons Wound #3 (Lower Leg) Wound Laterality: Right, Medial, Distal Cleanser Peri-Wound Care Topical Primary Dressing Santyl Ointment Discharge Instruction: Apply nickel thick amount to wound bed as instructed Secondary Dressing Zetuvit Plus Silicone Border Dressing 4x4 (in/in) Discharge Instruction: Apply silicone border over primary dressing as directed. Secured With Elastic Bandage 4 inch (ACE bandage) Discharge Instruction: Secure with ACE bandage as directed. Compression Wrap Compression Stockings Add-Ons Wound #4 (Lower Leg) Wound Laterality: Left, Anterior Cleanser Peri-Wound  Care Topical Primary Dressing Santyl Ointment Discharge Instruction: Apply nickel thick amount to wound bed as  instructed Secondary Dressing Zetuvit Plus Silicone Border Dressing 7x7(in/in) Discharge Instruction: Apply silicone border over primary dressing as directed. Secured With Elastic Bandage 4 inch (ACE bandage) Discharge Instruction: Secure with ACE bandage as directed. Compression Wrap Compression Stockings Add-Ons Wound #5 (Lower Leg) Wound Laterality: Left, Anterior, Distal Cleanser Peri-Wound Care Topical Primary Dressing Santyl Ointment Discharge Instruction: Apply nickel thick amount to wound bed as instructed Secondary Dressing Zetuvit Plus Silicone Border Dressing 4x4 (in/in) Discharge Instruction: Apply silicone border over primary dressing as directed. Secured With Elastic Bandage 4 inch (ACE bandage) Discharge Instruction: Secure with ACE bandage as directed. Compression Wrap Compression Stockings Add-Ons Wound #6 (Lower Leg) Wound Laterality: Left, Lateral Cleanser Peri-Wound Care Topical Primary Dressing Santyl Ointment Discharge Instruction: Apply nickel thick amount to wound bed as instructed Secondary Dressing Zetuvit Plus Silicone Border Dressing 4x4 (in/in) Discharge Instruction: Apply silicone border over primary dressing as directed. Secured With Elastic Bandage 4 inch (ACE bandage) Discharge Instruction: Secure with ACE bandage as directed. Compression Wrap Compression Stockings Add-Ons Wound #7 (Lower Leg) Wound Laterality: Left, Lateral, Anterior Cleanser Peri-Wound Care Topical Primary Dressing Santyl Ointment Discharge Instruction: Apply nickel thick amount to wound bed as instructed Secondary Dressing Zetuvit Plus Silicone Border Dressing 4x4 (in/in) Discharge Instruction: Apply silicone border over primary dressing as directed. Secured With Elastic Bandage 4 inch (ACE bandage) Discharge Instruction: Secure with ACE bandage as  directed. Compression Wrap Compression Stockings Add-Ons Wound #8 (Lower Leg) Wound Laterality: Left, Medial Cleanser Peri-Wound Care Topical Primary Dressing Santyl Ointment Discharge Instruction: Apply nickel thick amount to wound bed as instructed Secondary Dressing Zetuvit Plus Silicone Border Dressing 4x4 (in/in) Discharge Instruction: Apply silicone border over primary dressing as directed. Secured With Elastic Bandage 4 inch (ACE bandage) Discharge Instruction: Secure with ACE bandage as directed. Compression Wrap Compression Stockings Add-Ons Electronic Signature(s) Signed: 10/22/2021 9:34:51 AM By: Fredirick Maudlin MD FACS Signed: 10/22/2021 5:31:58 PM By: Baruch Gouty RN, BSN Entered By: Fredirick Maudlin on 10/22/2021 09:34:51 -------------------------------------------------------------------------------- Multi-Disciplinary Care Plan Details Patient Name: Date of Service: [facility] Lynn, [name] 10/22/2021 8:15 A M Medical Record Number: [id number] Patient Account Number: [id number] Date of Birth/Sex: Treating RN: [birth year] Lynn (61 y.o. Jessica Lynn Primary Care Lleyton Byers: Cassandria Anger Other Clinician: Referring Riya Huxford: Treating Papa Piercefield/Extender: Kelby Aline in Treatment: 1 Multidisciplinary Care Plan reviewed with physician Active Inactive Wound/Skin Impairment Nursing Diagnoses: Impaired tissue integrity Knowledge deficit related to ulceration/compromised skin integrity Goals: Patient/caregiver will verbalize understanding of skin care regimen Date Initiated: 10/14/2021 Target Resolution Date: 11/11/2021 Goal Status: Active Ulcer/skin breakdown will have a volume reduction of 30% by week 4 Date Initiated: 10/14/2021 Target Resolution Date: 11/11/2021 Goal Status: Active Interventions: Assess patient/caregiver ability to obtain necessary supplies Assess patient/caregiver ability to perform ulcer/skin care regimen upon  admission and as needed Assess ulceration(s) every visit Provide education on ulcer and skin care Treatment Activities: Skin care regimen initiated : 10/14/2021 Topical wound management initiated : 10/14/2021 Notes: Electronic Signature(s) Signed: 10/22/2021 5:31:58 PM By: Baruch Gouty RN, BSN Entered By: Baruch Gouty on 10/22/2021 08:49:04 -------------------------------------------------------------------------------- Pain Assessment Details Patient Name: Date of Service: Jessica Lynn, [place]. 10/22/2021 8:15 A M Medical Record Number: [id number] Patient Account Number: [id number] Date of Birth/Sex: Treating RN: [birth year] (61 y.o. Jessica Lynn Primary Care Vayda Dungee: Cassandria Anger Other Clinician: Referring Hershy Flenner: Treating Kelsie Zaborowski/Extender: Kelby Aline in Treatment: 1 Active Problems Location of Pain Severity and Description of Pain Patient Has Paino Yes Site Locations  Pain Location: Pain in Ulcers With Dressing Change: Yes Duration of the Pain. Constant / Intermittento Intermittent Rate the pain. Current Pain Level: 6 Worst Pain Level: 10 Least Pain Level: 0 Character of Pain Describe the Pain: Aching, Burning Pain Management and Medication Current Pain Management: Medication: Yes Is the Current Pain Management Adequate: Adequate Activity: Yes How does your wound impact your activities of daily livingo Sleep: Yes Bathing: No Appetite: No Relationship With Others: No Bladder Continence: No Emotions: No Bowel Continence: No Work: No Toileting: No Drive: No Dressing: No Hobbies: No Electronic Signature(s) Signed: 10/22/2021 5:31:58 PM By: Baruch Gouty RN, BSN Entered By: Baruch Gouty on 10/22/2021 08:25:59 -------------------------------------------------------------------------------- Patient/Caregiver Education Details Patient Name: Date of Service: Denice [place] Lynn, [place]. 7/18/2023andnbsp8:15 [place] Record Number: [id number] Patient Account Number: [id number] Date of Birth/Gender: Treating RN: [birth year] Lynn (61 y.o. Jessica Lynn Primary Care Physician: Cassandria Anger Other Clinician: Referring Physician: Treating Physician/Extender: Kelby Aline in Treatment: 1 Education Assessment Education Provided To: Patient Education Topics Provided CDC - Proper Use of Antibiotics: Methods: Explain/Verbal Responses: Reinforcements needed, State content correctly Infection: Methods: Explain/Verbal Responses: Reinforcements needed, State content correctly Wound/Skin Impairment: Methods: Explain/Verbal Responses: Reinforcements needed, State content correctly Electronic Signature(s) Signed: 10/22/2021 5:31:58 PM By: Baruch Gouty RN, BSN Entered By: Baruch Gouty on 10/22/2021 08:49:39 -------------------------------------------------------------------------------- Wound Assessment Details Patient Name: Date of Service: Jessica Lynn, [place]. 10/22/2021 8:15 A M Medical Record Number: [id number] Patient Account Number: [id number] Date of Birth/Sex: Treating RN: [birth year] Lynn (61 y.o. Jessica Lynn Primary Care Lonisha Bobby: Cassandria Anger Other Clinician: Referring Marg Macmaster: Treating Bernetha Anschutz/Extender: Kelby Aline in Treatment: 1 Wound Status Wound Number: 1 Primary Inflammatory Etiology: Wound Location: Left Upper Leg Wound Open Wounding Event: Bump Status: Date Acquired: 08/24/2021 Comorbid Cataracts, Anemia, Hypertension, Vasculitis, Osteoarthritis, Weeks Of Treatment: 1 History: Anorexia/bulimia, Confinement Anxiety Clustered Wound: No Photos Wound Measurements Length: (cm) 0.4 Width: (cm) 0.9 Depth: (cm) 0.1 Area: (cm) 0.283 Volume: (cm) 0.028 % Reduction in Area: 64.7% % Reduction in Volume: 65% Epithelialization: Medium (34-66%) Tunneling: No Undermining: No Wound  Description Classification: Full Thickness Without Exposed Support Structures Wound Margin: Distinct, outline attached Exudate Amount: Medium Exudate Type: Serous Exudate Color: amber Foul Odor After Cleansing: No Slough/Fibrino Yes Wound Bed Granulation Amount: Large (67-100%) Exposed Structure Granulation Quality: Red, Pink Fascia Exposed: No Necrotic Amount: Small (1-33%) Fat Layer (Subcutaneous Tissue) Exposed: Yes Necrotic Quality: Adherent Slough Tendon Exposed: No Muscle Exposed: No Joint Exposed: No Bone Exposed: No Treatment Notes Wound #1 (Upper Leg) Wound Laterality: Left Cleanser Peri-Wound Care Topical Primary Dressing Santyl Ointment Discharge Instruction: Apply nickel thick amount to wound bed as instructed Secondary Dressing Zetuvit Plus Silicone Border Dressing 4x4 (in/in) Discharge Instruction: Apply silicone border over primary dressing as directed. Secured With Elastic Bandage 4 inch (ACE bandage) Discharge Instruction: Secure with ACE bandage as directed. Compression Wrap Compression Stockings Add-Ons Electronic Signature(s) Signed: 10/22/2021 5:31:58 PM By: Baruch Gouty RN, BSN Entered By: Baruch Gouty on 10/22/2021 08:39:58 -------------------------------------------------------------------------------- Wound Assessment Details Patient Name: Date of Service: Jessica Lynn, [place]. 10/22/2021 8:15 A M Medical Record Number: [id number] Patient Account Number: [id number] Date of Birth/Sex: Treating RN: [birth year] (61 y.o. Jessica Lynn Primary Care Maryam Feely: Cassandria Anger Other Clinician: Referring Arlinda Barcelona: Treating Electra Paladino/Extender: Kelby Aline in Treatment: 1 Wound Status Wound Number: 2 Primary Inflammatory Etiology: Wound Location: Right, Medial Lower Leg Wound Open Wounding Event: Bump Status:  Date Acquired: 08/24/2021 Comorbid Cataracts, Anemia, Hypertension, Vasculitis,  Osteoarthritis, Weeks Of Treatment: 1 History: Anorexia/bulimia, Confinement Anxiety Clustered Wound: No Photos Wound Measurements Length: (cm) 1.3 Width: (cm) 1.2 Depth: (cm) 0.1 Area: (cm) 1.225 Volume: (cm) 0.123 % Reduction in Area: 20% % Reduction in Volume: 19.6% Epithelialization: None Tunneling: No Undermining: No Wound Description Classification: Full Thickness Without Exposed Support Structures Wound Margin: Distinct, outline attached Exudate Amount: Medium Exudate Type: Serous Exudate Color: amber Foul Odor After Cleansing: No Slough/Fibrino Yes Wound Bed Granulation Amount: Medium (34-66%) Exposed Structure Granulation Quality: Red, Pink Fascia Exposed: No Necrotic Amount: Medium (34-66%) Fat Layer (Subcutaneous Tissue) Exposed: Yes Necrotic Quality: Adherent Slough Tendon Exposed: No Muscle Exposed: No Joint Exposed: No Bone Exposed: No Treatment Notes Wound #2 (Lower Leg) Wound Laterality: Right, Medial Cleanser Peri-Wound Care Topical Primary Dressing Santyl Ointment Discharge Instruction: Apply nickel thick amount to wound bed as instructed Secondary Dressing Zetuvit Plus Silicone Border Dressing 4x4 (in/in) Discharge Instruction: Apply silicone border over primary dressing as directed. Secured With Elastic Bandage 4 inch (ACE bandage) Discharge Instruction: Secure with ACE bandage as directed. Compression Wrap Compression Stockings Add-Ons Electronic Signature(s) Signed: 10/22/2021 5:31:58 PM By: Baruch Gouty RN, BSN Entered By: Baruch Gouty on 10/22/2021 08:41:03 -------------------------------------------------------------------------------- Wound Assessment Details Patient Name: Date of Service: Jessica Lynn, Jessica Lynn. 10/22/2021 8:15 A M Medical Record Number: [id number] Patient Account Number: [id number] Date of Birth/Sex: Treating RN: [birth year] (61 y.o. Jessica Lynn Primary Care Nathalee Smarr: Cassandria Anger Other  Clinician: Referring Shandy Checo: Treating Dale Strausser/Extender: Kelby Aline in Treatment: 1 Wound Status Wound Number: 3 Primary Inflammatory Etiology: Wound Location: Right, Distal, Medial Lower Leg Wound Open Wounding Event: Bump Status: Date Acquired: 08/24/2021 Comorbid Cataracts, Anemia, Hypertension, Vasculitis, Osteoarthritis, Weeks Of Treatment: 1 History: Anorexia/bulimia, Confinement Anxiety Clustered Wound: No Photos Wound Measurements Length: (cm) 0.7 Width: (cm) 0.4 Depth: (cm) 0.1 Area: (cm) 0.22 Volume: (cm) 0.022 % Reduction in Area: 37.7% % Reduction in Volume: 37.1% Epithelialization: Small (1-33%) Tunneling: No Undermining: No Wound Description Classification: Full Thickness Without Exposed Support Structures Wound Margin: Distinct, outline attached Exudate Amount: Medium Exudate Type: Serous Exudate Color: amber Foul Odor After Cleansing: No Slough/Fibrino Yes Wound Bed Granulation Amount: Large (67-100%) Exposed Structure Granulation Quality: Red, Pink Fascia Exposed: No Necrotic Amount: Small (1-33%) Fat Layer (Subcutaneous Tissue) Exposed: Yes Necrotic Quality: Adherent Slough Tendon Exposed: No Muscle Exposed: No Joint Exposed: No Bone Exposed: No Treatment Notes Wound #3 (Lower Leg) Wound Laterality: Right, Medial, Distal Cleanser Peri-Wound Care Topical Primary Dressing Santyl Ointment Discharge Instruction: Apply nickel thick amount to wound bed as instructed Secondary Dressing Zetuvit Plus Silicone Border Dressing 4x4 (in/in) Discharge Instruction: Apply silicone border over primary dressing as directed. Secured With Elastic Bandage 4 inch (ACE bandage) Discharge Instruction: Secure with ACE bandage as directed. Compression Wrap Compression Stockings Add-Ons Electronic Signature(s) Signed: 10/22/2021 5:31:58 PM By: Baruch Gouty RN, BSN Entered By: Baruch Gouty on 10/22/2021  08:42:05 -------------------------------------------------------------------------------- Wound Assessment Details Patient Name: Date of Service: Jessica Lynn, [place]. 10/22/2021 8:15 A M Medical Record Number: [id number] Patient Account Number: [id number] Date of Birth/Sex: Treating RN: [birth year] (61 y.o. Jessica Lynn Primary Care Latronda Spink: Cassandria Anger Other Clinician: Referring Birch Farino: Treating Edwina Grossberg/Extender: Kelby Aline in Treatment: 1 Wound Status Wound Number: 4 Primary Inflammatory Etiology: Wound Location: Left, Anterior Lower Leg Wound Open Wounding Event: Bump Status: Date Acquired: 08/24/2021 Comorbid Cataracts, Anemia, Hypertension, Vasculitis, Osteoarthritis, Weeks Of Treatment: 1 History:  Anorexia/bulimia, Confinement Anxiety Clustered Wound: Yes Photos Wound Measurements Length: (cm) 5.3 Width: (cm) 2 Depth: (cm) 0.1 Clustered Quantity: 2 Area: (cm) 8.325 Volume: (cm) 0.833 % Reduction in Area: 1.9% % Reduction in Volume: 67.3% Epithelialization: Small (1-33%) Tunneling: No Undermining: No Wound Description Classification: Full Thickness Without Exposed Support Structures Wound Margin: Distinct, outline attached Exudate Amount: Medium Exudate Type: Serous Exudate Color: amber Foul Odor After Cleansing: No Slough/Fibrino Yes Wound Bed Granulation Amount: Medium (34-66%) Exposed Structure Granulation Quality: Red, Pink Fascia Exposed: No Necrotic Amount: Medium (34-66%) Fat Layer (Subcutaneous Tissue) Exposed: Yes Necrotic Quality: Adherent Slough Tendon Exposed: No Muscle Exposed: No Joint Exposed: No Bone Exposed: No Treatment Notes Wound #4 (Lower Leg) Wound Laterality: Left, Anterior Cleanser Peri-Wound Care Topical Primary Dressing Santyl Ointment Discharge Instruction: Apply nickel thick amount to wound bed as instructed Secondary Dressing Zetuvit Plus Silicone Border Dressing  7x7(in/in) Discharge Instruction: Apply silicone border over primary dressing as directed. Secured With Elastic Bandage 4 inch (ACE bandage) Discharge Instruction: Secure with ACE bandage as directed. Compression Wrap Compression Stockings Add-Ons Electronic Signature(s) Signed: 10/22/2021 5:31:58 PM By: Baruch Gouty RN, BSN Entered By: Baruch Gouty on 10/22/2021 08:42:44 -------------------------------------------------------------------------------- Wound Assessment Details Patient Name: Date of Service: Jessica Lynn, Jessica Lynn. 10/22/2021 8:15 A M Medical Record Number: [id number] Patient Account Number: [id number] Date of Birth/Sex: Treating RN: [birth year] (61 y.o. Jessica Lynn Primary Care Cayde Held: Cassandria Anger Other Clinician: Referring Pradyun Ishman: Treating Faithann Natal/Extender: Kelby Aline in Treatment: 1 Wound Status Wound Number: 5 Primary Inflammatory Etiology: Wound Location: Left, Distal, Anterior Lower Leg Wound Open Wounding Event: Bump Status: Date Acquired: 08/24/2021 Comorbid Cataracts, Anemia, Hypertension, Vasculitis, Osteoarthritis, Weeks Of Treatment: 1 History: Anorexia/bulimia, Confinement Anxiety Clustered Wound: No Photos Wound Measurements Length: (cm) 2.3 Width: (cm) 2.3 Depth: (cm) 0.2 Area: (cm) 4.155 Volume: (cm) 0.831 % Reduction in Area: -33.6% % Reduction in Volume: -33.6% Epithelialization: None Tunneling: No Undermining: No Wound Description Classification: Full Thickness Without Exposed Support Structures Wound Margin: Distinct, outline attached Exudate Amount: Medium Exudate Type: Serous Exudate Color: amber Foul Odor After Cleansing: No Slough/Fibrino Yes Wound Bed Granulation Amount: Medium (34-66%) Exposed Structure Granulation Quality: Red, Pink Fascia Exposed: No Necrotic Amount: Medium (34-66%) Fat Layer (Subcutaneous Tissue) Exposed: Yes Necrotic Quality: Adherent  Slough Tendon Exposed: No Muscle Exposed: No Joint Exposed: No Bone Exposed: No Treatment Notes Wound #5 (Lower Leg) Wound Laterality: Left, Anterior, Distal Cleanser Peri-Wound Care Topical Primary Dressing Santyl Ointment Discharge Instruction: Apply nickel thick amount to wound bed as instructed Secondary Dressing Zetuvit Plus Silicone Border Dressing 4x4 (in/in) Discharge Instruction: Apply silicone border over primary dressing as directed. Secured With Elastic Bandage 4 inch (ACE bandage) Discharge Instruction: Secure with ACE bandage as directed. Compression Wrap Compression Stockings Add-Ons Electronic Signature(s) Signed: 10/22/2021 5:31:58 PM By: Baruch Gouty RN, BSN Entered By: Baruch Gouty on 10/22/2021 08:43:19 -------------------------------------------------------------------------------- Wound Assessment Details Patient Name: Date of Service: Jessica Lynn, [place]. 10/22/2021 8:15 A M Medical Record Number: [id number] Patient Account Number: [id number] Date of Birth/Sex: Treating RN: [birth year] (61 y.o. Jessica Lynn Primary Care Lukah Goswami: Cassandria Anger Other Clinician: Referring Gio Janoski: Treating Lamika Connolly/Extender: Kelby Aline in Treatment: 1 Wound Status Wound Number: 6 Primary Inflammatory Etiology: Wound Location: Left, Lateral Lower Leg Wound Open Wounding Event: Bump Status: Date Acquired: 08/24/2021 Comorbid Cataracts, Anemia, Hypertension, Vasculitis, Osteoarthritis, Weeks Of Treatment: 1 History: Anorexia/bulimia, Confinement Anxiety Clustered Wound: No Photos Wound Measurements Length: (cm) 1.9 Width: (  cm) 1.3 Depth: (cm) 0.1 Area: (cm) 1.94 Volume: (cm) 0.194 % Reduction in Area: 7.1% % Reduction in Volume: 53.6% Epithelialization: None Tunneling: No Undermining: No Wound Description Classification: Full Thickness Without Exposed Support Structures Wound Margin: Distinct, outline  attached Exudate Amount: Medium Exudate Type: Serous Exudate Color: amber Foul Odor After Cleansing: No Slough/Fibrino Yes Wound Bed Granulation Amount: Medium (34-66%) Exposed Structure Granulation Quality: Red Fascia Exposed: No Necrotic Amount: Medium (34-66%) Fat Layer (Subcutaneous Tissue) Exposed: Yes Necrotic Quality: Adherent Slough Tendon Exposed: No Muscle Exposed: No Joint Exposed: No Bone Exposed: No Treatment Notes Wound #6 (Lower Leg) Wound Laterality: Left, Lateral Cleanser Peri-Wound Care Topical Primary Dressing Santyl Ointment Discharge Instruction: Apply nickel thick amount to wound bed as instructed Secondary Dressing Zetuvit Plus Silicone Border Dressing 4x4 (in/in) Discharge Instruction: Apply silicone border over primary dressing as directed. Secured With Elastic Bandage 4 inch (ACE bandage) Discharge Instruction: Secure with ACE bandage as directed. Compression Wrap Compression Stockings Add-Ons Electronic Signature(s) Signed: 10/22/2021 5:31:58 PM By: Baruch Gouty RN, BSN Entered By: Baruch Gouty on 10/22/2021 08:43:52 -------------------------------------------------------------------------------- Wound Assessment Details Patient Name: Date of Service: Jessica Lynn, Jessica Lynn. 10/22/2021 8:15 A M Medical Record Number: [id number] Patient Account Number: [id number] Date of Birth/Sex: Treating RN: [birth year] (61 y.o. Jessica Lynn Primary Care Tashiana Lamarca: Cassandria Anger Other Clinician: Referring Jewel Mcafee: Treating Decie Verne/Extender: Kelby Aline in Treatment: 1 Wound Status Wound Number: 7 Primary Inflammatory Etiology: Wound Location: Left, Lateral, Anterior Lower Leg Wound Open Wounding Event: Bump Status: Date Acquired: 08/24/2021 Comorbid Cataracts, Anemia, Hypertension, Vasculitis, Osteoarthritis, Weeks Of Treatment: 1 History: Anorexia/bulimia, Confinement Anxiety Clustered Wound:  No Photos Wound Measurements Length: (cm) 1.2 Width: (cm) 0.8 Depth: (cm) 0.1 Area: (cm) 0.754 Volume: (cm) 0.075 % Reduction in Area: -33.5% % Reduction in Volume: -31.6% Epithelialization: None Tunneling: No Undermining: No Wound Description Classification: Full Thickness Without Exposed Support Structu Wound Margin: Distinct, outline attached Exudate Amount: Medium Exudate Type: Serous Exudate Color: amber res Foul Odor After Cleansing: No Slough/Fibrino Yes Wound Bed Granulation Amount: Medium (34-66%) Exposed Structure Granulation Quality: Red Fascia Exposed: No Necrotic Amount: Medium (34-66%) Fat Layer (Subcutaneous Tissue) Exposed: Yes Necrotic Quality: Adherent Slough Tendon Exposed: No Muscle Exposed: No Joint Exposed: No Bone Exposed: No Treatment Notes Wound #7 (Lower Leg) Wound Laterality: Left, Lateral, Anterior Cleanser Peri-Wound Care Topical Primary Dressing Santyl Ointment Discharge Instruction: Apply nickel thick amount to wound bed as instructed Secondary Dressing Zetuvit Plus Silicone Border Dressing 4x4 (in/in) Discharge Instruction: Apply silicone border over primary dressing as directed. Secured With Elastic Bandage 4 inch (ACE bandage) Discharge Instruction: Secure with ACE bandage as directed. Compression Wrap Compression Stockings Add-Ons Electronic Signature(s) Signed: 10/22/2021 5:31:58 PM By: Baruch Gouty RN, BSN Entered By: Baruch Gouty on 10/22/2021 08:44:23 -------------------------------------------------------------------------------- Wound Assessment Details Patient Name: Date of Service: Jessica Lynn, Jessica Lynn. 10/22/2021 8:15 A M Medical Record Number: [id number] Patient Account Number: [id number] Date of Birth/Sex: Treating RN: [birth year] (61 y.o. Jessica Lynn Primary Care Jerrick Farve: Cassandria Anger Other Clinician: Referring Jessica Seidman: Treating Aquila Delaughter/Extender: Kelby Aline in Treatment: 1 Wound Status Wound Number: 8 Primary Inflammatory Etiology: Wound Location: Left, Medial Lower Leg Wound Open Wounding Event: Bump Status: Date Acquired: 08/24/2021 Comorbid Cataracts, Anemia, Hypertension, Vasculitis, Osteoarthritis, Weeks Of Treatment: 1 History: Anorexia/bulimia, Confinement Anxiety Clustered Wound: No Photos Wound Measurements Length: (cm) 2 Width: (cm) 1.9 Depth: (cm) 0.2 Area: (cm) 2.985 Volume: (cm) 0.597 % Reduction in Area: 13.6% %  Reduction in Volume: 42.4% Epithelialization: Small (1-33%) Tunneling: No Undermining: No Wound Description Classification: Full Thickness Without Exposed Support Structures Wound Margin: Distinct, outline attached Exudate Amount: Medium Exudate Type: Serous Exudate Color: amber Foul Odor After Cleansing: No Wound Bed Granulation Amount: Large (67-100%) Exposed Structure Granulation Quality: Red, Pink Fascia Exposed: No Necrotic Amount: Small (1-33%) Fat Layer (Subcutaneous Tissue) Exposed: Yes Necrotic Quality: Adherent Slough Tendon Exposed: No Muscle Exposed: No Joint Exposed: No Bone Exposed: No Treatment Notes Wound #8 (Lower Leg) Wound Laterality: Left, Medial Cleanser Peri-Wound Care Topical Primary Dressing Santyl Ointment Discharge Instruction: Apply nickel thick amount to wound bed as instructed Secondary Dressing Zetuvit Plus Silicone Border Dressing 4x4 (in/in) Discharge Instruction: Apply silicone border over primary dressing as directed. Secured With Elastic Bandage 4 inch (ACE bandage) Discharge Instruction: Secure with ACE bandage as directed. Compression Wrap Compression Stockings Add-Ons Electronic Signature(s) Signed: 10/22/2021 5:31:58 PM By: Baruch Gouty RN, BSN Entered By: Baruch Gouty on 10/22/2021 08:44:47 -------------------------------------------------------------------------------- Vitals Details Patient Name: Date of Service: Jessica Lynn, Jessica Lynn. 10/22/2021 8:15 A M Medical Record Number: [id number] Patient Account Number: [id number] Date of Birth/Sex: Treating RN: [birth year] (61 y.o. Jessica Lynn Primary Care Ashton Belote: Cassandria Anger Other Clinician: Referring Sheriece Jefcoat: Treating Laqueisha Catalina/Extender: Kelby Aline in Treatment: 1 Vital Signs Time Taken: 08:22 Temperature (F): 97.6 Height (in): 65 Pulse (bpm): 84 Respiratory Rate (breaths/min): 18 Blood Pressure (mmHg): 100/66 Reference Range: 80 - 120 mg / dl Electronic Signature(s) Signed: 10/22/2021 5:31:58 PM By: Baruch Gouty RN, BSN Entered By: Baruch Gouty on 10/22/2021 08:23:41

## 2021-10-22 NOTE — Progress Notes (Signed)
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 Date of Service: [name], [name] 10/22/2021 8:15 A M Medical Record Number: [id number] Patient Account Number: [id number] Date of Birth/Sex: Treating RN: Jul 21, 1960 (61 y.o. Elam Dutch Primary Care Provider: Cassandria Anger Other Clinician: Referring Provider: Treating Provider/Extender: Kelby Aline in Treatment: 1 Information Obtained From Patient Chart Eyes Medical History: Positive for: Cataracts - bil eyes Negative for: Glaucoma; Optic Neuritis Past Medical History Notes: panuveitis, macular pucker, peripheral focal choroiditis both eyes Ear/Nose/Mouth/Throat Medical History: Negative for: Chronic sinus problems/congestion; Middle ear problems Past Medical History Notes: seasonal allergies Hematologic/Lymphatic Medical History: Positive for: Anemia Cardiovascular Medical History: Positive for: Hypertension; Vasculitis - retinal and skin Past Medical History Notes: hypercholesteremia Gastrointestinal Medical History: Past Medical History Notes: GERD Endocrine Medical History: Negative for: Type I Diabetes; Type II Diabetes Genitourinary Medical  History: Negative for: End Stage Renal Disease Immunological Medical History: Negative for: Lupus Erythematosus; Raynauds; Scleroderma Integumentary (Skin) Medical History: Negative for: History of Burn Musculoskeletal Medical History: Positive for: Osteoarthritis Neurologic Medical History: Past Medical History Notes: carpal tunnel, chronic low back pain Oncologic Medical History: Negative for: Received Chemotherapy; Received Radiation Psychiatric Medical History: Positive for: Anorexia/bulimia - poor appetite; Confinement Anxiety HBO Extended History Items Eyes: Cataracts Immunizations Pneumococcal Vaccine: Received Pneumococcal Vaccination: No Implantable Devices No devices added Hospitalization / Surgery History Type of Hospitalization/Surgery Dilattion and curettage of uterus tubal ligation Family and Social History Cancer: Yes - Siblings; Diabetes: No; Heart Disease: No; Hereditary Spherocytosis: No; Hypertension: Yes - Father; Kidney Disease: No; Lung Disease: No; Seizures: No; Stroke: Yes - Father; Thyroid Problems: No; Tuberculosis: No; Former smoker - quit 25 yr ago; Marital Status - Married; Alcohol Use: Never; Drug Use: No History; Caffeine Use: Moderate; Financial Concerns: Yes; Food, Clothing or Shelter Needs: No; Support System Lacking: No; Transportation Concerns: No Electronic Signature(s) Signed: 10/22/2021 10:08:55 AM By: Fredirick Maudlin MD FACS Signed: 10/22/2021 5:31:58 PM By: Baruch Gouty RN, BSN Entered By: Fredirick Maudlin on 10/22/2021 09:39:07 -------------------------------------------------------------------------------- [place] Details Patient Name: Date of Service: [name], [name] 10/22/2021 Medical Record Number: [id number] Patient Account Number: [id number] Date of Birth/Sex: Treating RN: 02/12/1961 (61 y.o. 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 I10 Essential (primary) hypertension Facility Procedures CPT4 Code: [id number] Description: 96283 - DEB SUBQ TISSUE 20 SQ CM/< ICD-10 Diagnosis Description L97.812 Non-pressure chronic ulcer of other part of right lower leg with fat layer exp L97.822 Non-pressure chronic ulcer of other part of left lower leg with fat layer expo  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 Modifier: osed sed Quantity: 1 Physician Procedures : CPT4 Code Description Modifier [id number] - WC PHYS LEVEL 4 - EST PT 25 ICD-10 Diagnosis Description L97.812 Non-pressure chronic ulcer of other part of right lower leg with fat layer exposed L97.822 Non-pressure chronic ulcer of other part of left  lower leg with fat layer exposed 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  I10 Essential (primary) hypertension Quantity: 1 : [id number] - WC PHYS SUBQ TISS 20 SQ CM ICD-10 Diagnosis Description L97.812 Non-pressure chronic ulcer of other part of right lower leg with fat layer exposed L97.822 Non-pressure chronic ulcer of other part of left lower leg with fat layer exposed  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

## 2021-10-23 ENCOUNTER — Telehealth: Payer: Self-pay | Admitting: Internal Medicine

## 2021-10-23 NOTE — Telephone Encounter (Signed)
Pt called in to let provider know she has completed her predniSONE (DELTASONE) 10 MG tablet rx.

## 2021-10-24 ENCOUNTER — Ambulatory Visit (HOSPITAL_COMMUNITY)
Admission: RE | Admit: 2021-10-24 | Discharge: 2021-10-24 | Disposition: A | Discharge status: Home / Self Care | Payer: BC Managed Care – PPO | Source: Ambulatory Visit | Attending: General Surgery | Admitting: General Surgery

## 2021-10-24 ENCOUNTER — Ambulatory Visit: Payer: BC Managed Care – PPO | Admitting: Internal Medicine

## 2021-10-24 DIAGNOSIS — R52 Pain, unspecified: Secondary | ICD-10-CM | POA: Insufficient documentation

## 2021-10-25 NOTE — Telephone Encounter (Signed)
Noted! Thank you

## 2021-10-28 ENCOUNTER — Inpatient Hospital Stay (HOSPITAL_COMMUNITY)
Admission: EM | Admit: 2021-10-28 | Discharge: 2021-11-05 | DRG: 853 | Disposition: A | Discharge status: Home Health | Payer: BC Managed Care – PPO | Attending: Internal Medicine | Admitting: Internal Medicine

## 2021-10-28 ENCOUNTER — Emergency Department (HOSPITAL_COMMUNITY): Payer: BC Managed Care – PPO

## 2021-10-28 ENCOUNTER — Encounter (HOSPITAL_COMMUNITY): Payer: Self-pay

## 2021-10-28 ENCOUNTER — Inpatient Hospital Stay (HOSPITAL_COMMUNITY): Payer: BC Managed Care – PPO

## 2021-10-28 ENCOUNTER — Inpatient Hospital Stay (HOSPITAL_COMMUNITY): Admit: 2021-10-28 | Payer: BC Managed Care – PPO

## 2021-10-28 ENCOUNTER — Other Ambulatory Visit: Payer: Self-pay

## 2021-10-28 DIAGNOSIS — L97813 Non-pressure chronic ulcer of other part of right lower leg with necrosis of muscle: Secondary | ICD-10-CM | POA: Diagnosis present

## 2021-10-28 DIAGNOSIS — A4101 Sepsis due to Methicillin susceptible Staphylococcus aureus: Secondary | ICD-10-CM | POA: Diagnosis present

## 2021-10-28 DIAGNOSIS — Z87891 Personal history of nicotine dependence: Secondary | ICD-10-CM

## 2021-10-28 DIAGNOSIS — E669 Obesity, unspecified: Secondary | ICD-10-CM | POA: Diagnosis present

## 2021-10-28 DIAGNOSIS — M726 Necrotizing fasciitis: Secondary | ICD-10-CM | POA: Diagnosis present

## 2021-10-28 DIAGNOSIS — L97829 Non-pressure chronic ulcer of other part of left lower leg with unspecified severity: Secondary | ICD-10-CM | POA: Diagnosis present

## 2021-10-28 DIAGNOSIS — L02415 Cutaneous abscess of right lower limb: Secondary | ICD-10-CM

## 2021-10-28 DIAGNOSIS — Z885 Allergy status to narcotic agent status: Secondary | ICD-10-CM

## 2021-10-28 DIAGNOSIS — E876 Hypokalemia: Secondary | ICD-10-CM | POA: Diagnosis not present

## 2021-10-28 DIAGNOSIS — I83218 Varicose veins of right lower extremity with both ulcer of other part of lower extremity and inflammation: Secondary | ICD-10-CM | POA: Diagnosis present

## 2021-10-28 DIAGNOSIS — I10 Essential (primary) hypertension: Secondary | ICD-10-CM | POA: Diagnosis present

## 2021-10-28 DIAGNOSIS — K219 Gastro-esophageal reflux disease without esophagitis: Secondary | ICD-10-CM | POA: Diagnosis present

## 2021-10-28 DIAGNOSIS — Z800 Family history of malignant neoplasm of digestive organs: Secondary | ICD-10-CM

## 2021-10-28 DIAGNOSIS — E43 Unspecified severe protein-calorie malnutrition: Secondary | ICD-10-CM | POA: Insufficient documentation

## 2021-10-28 DIAGNOSIS — E861 Hypovolemia: Secondary | ICD-10-CM | POA: Diagnosis present

## 2021-10-28 DIAGNOSIS — A419 Sepsis, unspecified organism: Secondary | ICD-10-CM | POA: Diagnosis not present

## 2021-10-28 DIAGNOSIS — E785 Hyperlipidemia, unspecified: Secondary | ICD-10-CM | POA: Diagnosis present

## 2021-10-28 DIAGNOSIS — R6520 Severe sepsis without septic shock: Secondary | ICD-10-CM | POA: Diagnosis not present

## 2021-10-28 DIAGNOSIS — M7989 Other specified soft tissue disorders: Secondary | ICD-10-CM | POA: Diagnosis present

## 2021-10-28 DIAGNOSIS — I83228 Varicose veins of left lower extremity with both ulcer of other part of lower extremity and inflammation: Secondary | ICD-10-CM | POA: Diagnosis present

## 2021-10-28 DIAGNOSIS — D62 Acute posthemorrhagic anemia: Secondary | ICD-10-CM | POA: Diagnosis not present

## 2021-10-28 DIAGNOSIS — H209 Unspecified iridocyclitis: Secondary | ICD-10-CM

## 2021-10-28 DIAGNOSIS — Z79899 Other long term (current) drug therapy: Secondary | ICD-10-CM

## 2021-10-28 DIAGNOSIS — N179 Acute kidney failure, unspecified: Secondary | ICD-10-CM | POA: Diagnosis present

## 2021-10-28 DIAGNOSIS — E871 Hypo-osmolality and hyponatremia: Secondary | ICD-10-CM | POA: Diagnosis present

## 2021-10-28 DIAGNOSIS — D649 Anemia, unspecified: Secondary | ICD-10-CM | POA: Diagnosis not present

## 2021-10-28 DIAGNOSIS — N170 Acute kidney failure with tubular necrosis: Secondary | ICD-10-CM | POA: Diagnosis present

## 2021-10-28 DIAGNOSIS — J45909 Unspecified asthma, uncomplicated: Secondary | ICD-10-CM | POA: Diagnosis present

## 2021-10-28 DIAGNOSIS — Z6828 Body mass index (BMI) 28.0-28.9, adult: Secondary | ICD-10-CM

## 2021-10-28 DIAGNOSIS — E860 Dehydration: Secondary | ICD-10-CM | POA: Diagnosis present

## 2021-10-28 DIAGNOSIS — Z888 Allergy status to other drugs, medicaments and biological substances status: Secondary | ICD-10-CM

## 2021-10-28 DIAGNOSIS — L0291 Cutaneous abscess, unspecified: Secondary | ICD-10-CM | POA: Diagnosis not present

## 2021-10-28 DIAGNOSIS — E7800 Pure hypercholesterolemia, unspecified: Secondary | ICD-10-CM | POA: Diagnosis present

## 2021-10-28 DIAGNOSIS — L0390 Cellulitis, unspecified: Secondary | ICD-10-CM | POA: Diagnosis not present

## 2021-10-28 DIAGNOSIS — Z8371 Family history of colonic polyps: Secondary | ICD-10-CM

## 2021-10-28 DIAGNOSIS — R6521 Severe sepsis with septic shock: Secondary | ICD-10-CM | POA: Diagnosis present

## 2021-10-28 DIAGNOSIS — L03115 Cellulitis of right lower limb: Secondary | ICD-10-CM | POA: Diagnosis present

## 2021-10-28 LAB — BASIC METABOLIC PANEL
Anion gap: 11 (ref 5–15)
BUN: 40 mg/dL — ABNORMAL HIGH (ref 6–20)
CO2: 23 mmol/L (ref 22–32)
Calcium: 9.2 mg/dL (ref 8.9–10.3)
Chloride: 99 mmol/L (ref 98–111)
Creatinine, Ser: 2.98 mg/dL — ABNORMAL HIGH (ref 0.44–1.00)
GFR, Estimated: 18 mL/min — ABNORMAL LOW (ref >60)
Glucose, Bld: 115 mg/dL — ABNORMAL HIGH (ref 70–99)
Potassium: 4.3 mmol/L (ref 3.5–5.1)
Sodium: 133 mmol/L — ABNORMAL LOW (ref 135–145)

## 2021-10-28 LAB — I-STAT CHEM 8, ED
BUN: 37 mg/dL — ABNORMAL HIGH (ref 6–20)
Calcium, Ion: 1.16 mmol/L (ref 1.15–1.40)
Chloride: 98 mmol/L (ref 98–111)
Creatinine, Ser: 3.3 mg/dL — ABNORMAL HIGH (ref 0.44–1.00)
Glucose, Bld: 108 mg/dL — ABNORMAL HIGH (ref 70–99)
HCT: 29 % — ABNORMAL LOW (ref 36.0–46.0)
Hemoglobin: 9.9 g/dL — ABNORMAL LOW (ref 12.0–15.0)
Potassium: 4.3 mmol/L (ref 3.5–5.1)
Sodium: 130 mmol/L — ABNORMAL LOW (ref 135–145)
TCO2: 22 mmol/L (ref 22–32)

## 2021-10-28 LAB — HEPATIC FUNCTION PANEL
ALT: 32 U/L (ref 0–44)
AST: 31 U/L (ref 15–41)
Albumin: 3.1 g/dL — ABNORMAL LOW (ref 3.5–5.0)
Alkaline Phosphatase: 78 U/L (ref 38–126)
Bilirubin, Direct: 0.2 mg/dL (ref 0.0–0.2)
Indirect Bilirubin: 0.7 mg/dL (ref 0.3–0.9)
Total Bilirubin: 0.9 mg/dL (ref 0.3–1.2)
Total Protein: 6.4 g/dL — ABNORMAL LOW (ref 6.5–8.1)

## 2021-10-28 LAB — CBC WITH DIFFERENTIAL/PLATELET
Abs Immature Granulocytes: 0.1 10*3/uL — ABNORMAL HIGH (ref 0.00–0.07)
Basophils Absolute: 0 10*3/uL (ref 0.0–0.1)
Basophils Relative: 0 %
Eosinophils Absolute: 0 10*3/uL (ref 0.0–0.5)
Eosinophils Relative: 0 %
HCT: 28.6 % — ABNORMAL LOW (ref 36.0–46.0)
Hemoglobin: 9.7 g/dL — ABNORMAL LOW (ref 12.0–15.0)
Immature Granulocytes: 1 %
Lymphocytes Relative: 20 %
Lymphs Abs: 2.4 10*3/uL (ref 0.7–4.0)
MCH: 34.9 pg — ABNORMAL HIGH (ref 26.0–34.0)
MCHC: 33.9 g/dL (ref 30.0–36.0)
MCV: 102.9 fL — ABNORMAL HIGH (ref 80.0–100.0)
Monocytes Absolute: 1.1 10*3/uL — ABNORMAL HIGH (ref 0.1–1.0)
Monocytes Relative: 9 %
Neutro Abs: 8.5 10*3/uL — ABNORMAL HIGH (ref 1.7–7.7)
Neutrophils Relative %: 70 %
Platelets: 193 10*3/uL (ref 150–400)
RBC: 2.78 MIL/uL — ABNORMAL LOW (ref 3.87–5.11)
RDW: 12.5 % (ref 11.5–15.5)
WBC: 12.2 10*3/uL — ABNORMAL HIGH (ref 4.0–10.5)
nRBC: 0 % (ref 0.0–0.2)

## 2021-10-28 LAB — URINALYSIS, ROUTINE W REFLEX MICROSCOPIC
Bilirubin Urine: NEGATIVE
Glucose, UA: NEGATIVE mg/dL
Hgb urine dipstick: NEGATIVE
Ketones, ur: NEGATIVE mg/dL
Nitrite: NEGATIVE
Protein, ur: NEGATIVE mg/dL
Specific Gravity, Urine: 1.006 (ref 1.005–1.030)
pH: 5 (ref 5.0–8.0)

## 2021-10-28 LAB — PROTIME-INR
INR: 1.1 (ref 0.8–1.2)
Prothrombin Time: 14.2 s (ref 11.4–15.2)

## 2021-10-28 LAB — APTT: aPTT: 25 s (ref 24–36)

## 2021-10-28 LAB — LACTIC ACID, PLASMA
Lactic Acid, Venous: 1.6 mmol/L (ref 0.5–1.9)
Lactic Acid, Venous: 2.4 mmol/L (ref 0.5–1.9)

## 2021-10-28 LAB — BRAIN NATRIURETIC PEPTIDE: B Natriuretic Peptide: 29.5 pg/mL (ref 0.0–100.0)

## 2021-10-28 MED ORDER — SODIUM CHLORIDE 0.9 % IV SOLN
2.0000 g | INTRAVENOUS | Status: DC
  Administered 2021-10-28: 2 g via INTRAVENOUS
  Filled 2021-10-28: qty 12.5

## 2021-10-28 MED ORDER — LACTATED RINGERS IV BOLUS
1000.0000 mL | Freq: Once | INTRAVENOUS | Status: AC
  Administered 2021-10-28: 1000 mL via INTRAVENOUS

## 2021-10-28 MED ORDER — MONTELUKAST SODIUM 10 MG PO TABS
10.0000 mg | ORAL_TABLET | Freq: Every day | ORAL | Status: DC
  Administered 2021-10-29 – 2021-11-04 (×7): 10 mg via ORAL
  Filled 2021-10-28 (×7): qty 1

## 2021-10-28 MED ORDER — LACTATED RINGERS IV BOLUS (SEPSIS)
1000.0000 mL | Freq: Once | INTRAVENOUS | Status: AC
  Administered 2021-10-28: 1000 mL via INTRAVENOUS

## 2021-10-28 MED ORDER — ACETAMINOPHEN 325 MG PO TABS: 650.0000 mg | ORAL_TABLET | Freq: Four times a day (QID) | ORAL | Status: DC | PRN

## 2021-10-28 MED ORDER — VANCOMYCIN HCL 1500 MG/300ML IV SOLN
1500.0000 mg | Freq: Once | INTRAVENOUS | Status: AC
  Administered 2021-10-28: 1500 mg via INTRAVENOUS
  Filled 2021-10-28: qty 300

## 2021-10-28 MED ORDER — VANCOMYCIN VARIABLE DOSE PER UNSTABLE RENAL FUNCTION (PHARMACIST DOSING): Status: DC

## 2021-10-28 MED ORDER — ACETAMINOPHEN 650 MG RE SUPP: 650.0000 mg | Freq: Four times a day (QID) | RECTAL | Status: DC | PRN

## 2021-10-28 MED ORDER — HYDROCODONE-ACETAMINOPHEN 5-325 MG PO TABS
1.0000 | ORAL_TABLET | ORAL | Status: DC | PRN
  Administered 2021-10-29 (×2): 2 via ORAL
  Filled 2021-10-28 (×2): qty 2

## 2021-10-28 MED ORDER — METRONIDAZOLE 500 MG/100ML IV SOLN
500.0000 mg | Freq: Two times a day (BID) | INTRAVENOUS | Status: DC
  Administered 2021-10-28 – 2021-10-29 (×2): 500 mg via INTRAVENOUS
  Filled 2021-10-28 (×2): qty 100

## 2021-10-28 MED ORDER — DORZOLAMIDE HCL-TIMOLOL MAL 2-0.5 % OP SOLN
1.0000 [drp] | Freq: Two times a day (BID) | OPHTHALMIC | Status: DC
  Administered 2021-10-29 – 2021-11-05 (×12): 1 [drp] via OPHTHALMIC
  Filled 2021-10-28: qty 10

## 2021-10-28 MED ORDER — SODIUM CHLORIDE 0.9 % IV SOLN: INTRAVENOUS | Status: DC

## 2021-10-28 NOTE — Assessment & Plan Note (Signed)
obtaine anemia panel hem occult stool Expect some drop with IV fluid resuscitation.  Obtain type and screen just in case she has blood products. Transfuse for hemoglobin below 7 or drop in rapidly

## 2021-10-28 NOTE — Assessment & Plan Note (Addendum)
restart when able Pravachol 20 mg p.o. daily

## 2021-10-28 NOTE — Assessment & Plan Note (Addendum)
-  admit per cellulitis protocol will      continue current antibiotic choice cefepime metronidazole bank      Will obtain MRSA screening,     obtain blood cultures      further antibiotic adjustment pending above results  chronic leg wounds cause unclear appreciate ID consult Cannot rule out possibility of abscess Recent ultrasound showed possible fluid collection. May benefit from general surgery consult to see if there is any drainable abscesses

## 2021-10-28 NOTE — ED Provider Triage Note (Signed)
Emergency Medicine Provider Triage Evaluation Note  Jessica Lynn , a 61 y.o. female  was evaluated in triage.  Pt complains of abnormal lab.  Patient has been having sores, tremors, fatigue, being worked up by dermatology and rheumatology currently.  Apparently rheumatologist called her and told her that she had abnormal kidney function need to come to the emergency room today.  Review of Systems  Positive: Fatigue Negative: Fever  Physical Exam  BP (!) 94/52   Pulse 96   Temp 97.9 F (36.6 C) (Oral)   Resp 18   LMP 06/06/2015 Comment: irregular   SpO2 100%  Gen:   Awake, no distress   Resp:  Normal effort  MSK:   Moves extremities without difficulty  Other:  No abdominal distention  Medical Decision Making  Medically screening exam initiated at 4:25 PM.  Appropriate orders placed.  Jessica Lynn was informed that the remainder of the evaluation will be completed by another provider, this initial triage assessment does not replace that evaluation, and the importance of remaining in the ED until their evaluation is complete.  Work-up initiated   Margarita Mail, PA-C 10/28/21 1628

## 2021-10-28 NOTE — H&P (Signed)
Jessica Lynn:[id number] DOB: 1960-07-18 DOA: 10/28/2021     PCP: Cassandria Anger, MD   Outpatient Specialists:    Rheumatology [facility] Patient arrived to ER on 10/28/21 at 1536 Referred by Attending Fredia Sorrow, MD   Patient coming from:    home Lives  With family    Chief Complaint:  leg swelling and pain Chief Complaint  Patient presents with   Abnormal Lab    HPI: Jessica Lynn is a 61 y.o. female with medical history significant of chronic bilateral lower extremity ulcers followed by rheumatology cause unclear, history of retinal vasculitis    Presented with lower extremity ulcers worsening RLE pain and swelling chronic ulcers  On out patient Antibiotics and failed Initially had some low bp and was given 3L of fluids  In the begging of June she was taking a medication for her retinal vasculitis  She developed hives all over her body followed by nodules over her legs and left inguinal area The nodule eventually ulcerated  She has been taking Augmentin at home with no improvement of her ulcers  No fever no chills  Does not smoke nor drink etoh  She have had a recent  of right leg Large complex fluid collection within the subcutaneous soft tissues of the right lateral mid lower leg which could be due to abscess or potentially hematoma if there is history of trauma. Considerable edema and hyperemia of the surrounding soft tissues. Aspiration may be performed as clinically indicated.  She stopped eating and drinking Has weaned off her prednisone over the past 1 wk that she was taken for the past 1 month   Regarding pertinent Chronic problems:     Hyperlipidemia - on statins Pravachol (pravastatin)  Lipid Panel     Component Value Date/Time   CHOL 148 12/16/2019 1636   TRIG 71 12/16/2019 1636   HDL 70 12/16/2019 1636   CHOLHDL 2.1 12/16/2019 1636   VLDL 15.6 09/07/2018 1610   LDLCALC 63 12/16/2019 1636   LDLDIRECT 160.7 02/06/2012 0911      HTN on Diovan       Asthma -well   controlled on home inhalers/ nebs f              Chronic anemia - baseline hg Hemoglobin & Hematocrit  Recent Labs    09/16/21 1104 10/28/21 1628 10/28/21 1709  HGB 9.5 Repeated and verified X2.* 9.7* 9.9*    While in ER: Noted to be hypotensive requiring 4 L of fluids started on broad-spectrum antibiotics cefepime Flagyl and bank PCCM has been consulted but feels the patient is stable for progressive at this time  CXR -  NON acute    Following Medications were ordered in ER: Medications  vancomycin variable dose per unstable renal function (pharmacist dosing) (has no administration in time range)  ceFEPIme (MAXIPIME) 2 g in sodium chloride 0.9 % 100 mL IVPB (0 g Intravenous Stopped 10/28/21 2000)  metroNIDAZOLE (FLAGYL) IVPB 500 mg (0 mg Intravenous Stopped 10/28/21 2102)  lactated ringers bolus 1,000 mL (0 mLs Intravenous Stopped 10/28/21 2102)  vancomycin (VANCOREADY) IVPB 1500 mg/300 mL (1,500 mg Intravenous New Bag/Given 10/28/21 1955)  lactated ringers bolus 1,000 mL (1,000 mLs Intravenous New Bag/Given 10/28/21 2017)  lactated ringers bolus 1,000 mL (1,000 mLs Intravenous New Bag/Given 10/28/21 2148)    ________    ED Triage Vitals  Enc Vitals Group     BP 10/28/21 1612 (!) 94/52     Pulse Rate  10/28/21 1612 96     Resp 10/28/21 1612 18     Temp 10/28/21 1612 97.9 F (36.6 C)     Temp Source 10/28/21 1612 Oral     SpO2 10/28/21 1612 100 %     Weight --      Height --      Head Circumference --      Peak Flow --      Pain Score 10/28/21 1622 3     Pain Loc --      Pain Edu? --      Excl. in [place]? --   TMAX(24)@     _________________________________________ Significant initial  Findings: Abnormal Labs Reviewed  BASIC METABOLIC PANEL - Abnormal; Notable for the following components:      Result Value   Sodium 133 (*)    Glucose, Bld 115 (*)    BUN 40 (*)    Creatinine, Ser 2.98 (*)    GFR, Estimated 18 (*)    All other  components within normal limits  CBC WITH DIFFERENTIAL/PLATELET - Abnormal; Notable for the following components:   WBC 12.2 (*)    RBC 2.78 (*)    Hemoglobin 9.7 (*)    HCT 28.6 (*)    MCV 102.9 (*)    MCH 34.9 (*)    Neutro Abs 8.5 (*)    Monocytes Absolute 1.1 (*)    Abs Immature Granulocytes 0.10 (*)    All other components within normal limits  LACTIC ACID, PLASMA - Abnormal; Notable for the following components:   Lactic Acid, Venous 2.4 (*)    All other components within normal limits  HEPATIC FUNCTION PANEL - Abnormal; Notable for the following components:   Total Protein 6.4 (*)    Albumin 3.1 (*)    All other components within normal limits  I-STAT CHEM 8, ED - Abnormal; Notable for the following components:   Sodium 130 (*)    BUN 37 (*)    Creatinine, Ser 3.30 (*)    Glucose, Bld 108 (*)    Hemoglobin 9.9 (*)    HCT 29.0 (*)    All other components within normal limits     ECG: Ordered Personally reviewed and interpreted by me showing: HR : 63 Rhythm: NSR   no evidence of ischemic changes QTC 435   ____________________ This patient meets SIRS Criteria and may be septic.    The recent clinical data is shown below. Vitals:   10/28/21 2202 10/28/21 2215 10/28/21 2230 10/28/21 2245  BP: 94/64 (!) 88/56 (!) 91/55 100/67  Pulse: 98   (!) 102  Resp: '16 17 17 17  '$ Temp:      TempSrc:      SpO2: 97%   98%     WBC     Component Value Date/Time   WBC 12.2 (H) 10/28/2021 1628   LYMPHSABS 2.4 10/28/2021 1628   MONOABS 1.1 (H) 10/28/2021 1628   EOSABS 0.0 10/28/2021 1628   BASOSABS 0.0 10/28/2021 1628    Lactic Acid, Venous    Component Value Date/Time   LATICACIDVEN 2.4 (HH) 10/28/2021 2133     Procalcitonin *** Ordered Lactic Acid, Venous    Component Value Date/Time   LATICACIDVEN 2.4 ([place]) 10/28/2021 2133       UA  no evidence of UTI     Urine analysis:    Component Value Date/Time   COLORURINE YELLOW 10/28/2021 2259   APPEARANCEUR  HAZY (A) 10/28/2021 2259   LABSPEC 1.006 10/28/2021  [place] 5.0 10/28/2021 2259   GLUCOSEU NEGATIVE 10/28/2021 2259   GLUCOSEU NEGATIVE 09/05/2020 0937   HGBUR NEGATIVE 10/28/2021 2259   BILIRUBINUR NEGATIVE 10/28/2021 2259   KETONESUR NEGATIVE 10/28/2021 2259   PROTEINUR NEGATIVE 10/28/2021 2259   UROBILINOGEN 0.2 09/05/2020 0937   NITRITE NEGATIVE 10/28/2021 2259   LEUKOCYTESUR TRACE (A) 10/28/2021 2259    _______________________________________________ Hospitalist was called for admission for sepsis due to leg wounds   The following Work up has been ordered so far:  Orders Placed This Encounter  Procedures   Blood Culture (routine x 2)   Urine Culture   DG Chest Port 1 View   Basic metabolic panel   CBC with Differential   Urinalysis, Routine w reflex microscopic   Lactic acid, plasma   Protime-INR   APTT   Hepatic function panel   Brain natriuretic peptide   Diet NPO time specified   Cardiac monitoring   Document height and weight   Assess and Document Glasgow Coma Scale   Document vital signs within 1-hour of fluid bolus completion.  Notify provider of abnormal vital signs despite fluid resuscitation.   Refer to Sidebar Report: Sepsis Bundle ED/IP   Notify provider for difficulties obtaining IV access   Initiate [place] Fluid Protocol   Check Rectal Temperature   vancomycin per pharmacy consult   CeFEPIme (MAXIPIME) per pharmacy consult            Consult to intensivist   Pulse oximetry, continuous   I-stat chem 8, ED   ED EKG 12-Lead   Insert peripheral IV X 1     OTHER Significant initial  Findings:  labs showing:  Recent Labs  Lab 10/28/21 1628 10/28/21 1709  NA 133* 130*  K 4.3 4.3  CO2 23  --   GLUCOSE 115* 108*  BUN 40* 37*  CREATININE 2.98* 3.30*  CALCIUM 9.2  --     Cr Up from baseline see below Lab Results  Component Value Date   CREATININE 3.30 (H) 10/28/2021   CREATININE 2.98 (H) 10/28/2021   CREATININE 0.94 09/16/2021     Recent Labs  Lab 10/28/21 1814  AST 31  ALT 32  ALKPHOS 78  BILITOT 0.9  PROT 6.4*  ALBUMIN 3.1*   Lab Results  Component Value Date   CALCIUM 9.2 10/28/2021        Plt: Lab Results  Component Value Date   PLT 193 10/28/2021        Recent Labs  Lab 10/28/21 1628 10/28/21 1709  WBC 12.2*  --   NEUTROABS 8.5*  --   HGB 9.7* 9.9*  HCT 28.6* 29.0*  MCV 102.9*  --   PLT 193  --     HG/HCT   stable,     Component Value Date/Time   HGB 9.9 (L) 10/28/2021 1709   HCT 29.0 (L) 10/28/2021 1709   MCV 102.9 (H) 10/28/2021 1628      Cardiac Panel (last 3 results) No results for input(s): "CKTOTAL", "CKMB", "TROPONINI", "RELINDX" in the last 72 hours.  .car BNP (last 3 results) Recent Labs    10/28/21 1814  BNP 29.5      DM  labs:  HbA1C: Recent Labs    09/16/21 1104  HGBA1C 5.5       CBG (last 3)  No results for input(s): "GLUCAP" in the last 72 hours.        Cultures: No results found for: "SDES", "SPECREQUEST", "CULT", "REPTSTATUS"   Radiological  Exams on Admission: DG Chest Port 1 View  Result Date: 10/28/2021 CLINICAL DATA:  Abnormal laboratory values EXAM: PORTABLE CHEST 1 VIEW COMPARISON:  08/18/2016 FINDINGS: The heart size and mediastinal contours are within normal limits. Both lungs are clear. The visualized skeletal structures are unremarkable. IMPRESSION: No active disease. Electronically Signed   By: Donavan Foil M.D.   On: 10/28/2021 18:40   _______________________________________________________________________________________________________ Latest   Blood pressure 100/67, pulse (!) 102, temperature 98.6 F (37 C), temperature source Rectal, resp. rate 17, last menstrual period 06/06/2015, SpO2 98 %.   Vitals  labs and radiology finding personally reviewed  Review of Systems:    Pertinent positives include:   Fevers, chills, fatigue,  Constitutional:  No weight loss, night sweats, weight loss  HEENT:  No headaches,  Difficulty swallowing,Tooth/dental problems,Sore throat,  No sneezing, itching, ear ache, nasal congestion, post nasal drip,  Cardio-vascular:  No chest pain, Orthopnea, PND, anasarca, dizziness, palpitations.no Bilateral lower extremity swelling  GI:  No heartburn, indigestion, abdominal pain, nausea, vomiting, diarrhea, change in bowel habits, loss of appetite, melena, blood in stool, hematemesis Resp:  no shortness of breath at rest. No dyspnea on exertion, No excess mucus, no productive cough, No non-productive cough, No coughing up of blood.No change in color of mucus.No wheezing. Skin:  no rash or lesions. No jaundice GU:  no dysuria, change in color of urine, no urgency or frequency. No straining to urinate.  No flank pain.  Musculoskeletal:  No joint pain or no joint swelling. No decreased range of motion. No back pain.  Psych:  No change in mood or affect. No depression or anxiety. No memory loss.  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 Her smoking use included cigarettes. She has a 0.80 pack-year smoking history. She has never used  smokeless tobacco. She reports that she does not drink alcohol and does not use drugs.     Family History:   Family History  Problem Relation Age of Onset   Colon cancer Brother 64   Colon polyps Mother    Colon polyps Father    Colon cancer Maternal Uncle    Esophageal cancer Neg Hx    Rectal cancer Neg Hx    Stomach cancer Neg Hx    ______________________________________________________________________________________________ Allergies: Allergies  Allergen Reactions   Crestor [Rosuvastatin]     cramps   Oxycodone Itching     Prior to Admission medications   Medication Sig Start Date End Date Taking? Authorizing Provider  amoxicillin-clavulanate (AUGMENTIN) 875-125 MG tablet Take 1 tablet by mouth  2 (two) times daily. 10/16/21   [provider]  brimonidine-timolol (COMBIGAN) 0.2-0.5 % ophthalmic solution Place 1 drop into both eyes 2 times daily. Patient not taking: Reported on 10/17/2021 01/11/21   [provider]  Cholecalciferol (VITAMIN D3) 2000 units capsule Take 1 capsule (2,000 Units total) by mouth daily. 12/15/16   Plotnikov, Evie Lacks, MD  clonazePAM (KLONOPIN) 1 MG tablet Take 1/2 to 1  Tablet by mouth two times daily as needed for nerves Patient not taking: Reported on 10/17/2021 12/16/19   Plotnikov, Evie Lacks, MD  dorzolamide-timolol (COSOPT) 22.3-6.8 MG/ML ophthalmic solution 1 drop 2 (two) times daily. 1 drop 2 (two) times daily 11/20/20   [provider]  folic acid (FOLVITE) 1 MG tablet TAKE 1 TABLET(1 MG) BY MOUTH DAILY 05/20/21   Plotnikov, Evie Lacks, MD  furosemide (LASIX) 40 MG tablet Take 1 tablet (40 mg total) by mouth daily. 09/16/21 09/16/22  Plotnikov, Evie Lacks, MD  HYDROcodone-acetaminophen (NORCO/VICODIN) 5-325 MG tablet Take 1 tablet by mouth 3 (three) times daily as needed for severe pain. 10/17/21 10/17/22  Plotnikov, Evie Lacks, MD  HYDROcodone-acetaminophen (NORCO/VICODIN) 5-325 MG tablet Take 1 tablet by mouth 3 (three) times  daily as needed for severe pain. 10/17/21 10/17/22  Plotnikov, Evie Lacks, MD  hydrOXYzine (ATARAX/VISTARIL) 25 MG tablet TAKE 1 TABLET BY MOUTH EVERY 8 HOURS AS NEEDED FOR ITCHING 11/30/20   Plotnikov, Evie Lacks, MD  KLOR-CON M20 20 MEQ tablet TAKE 1 TABLET BY MOUTH EVERY DAY 10/15/21   Plotnikov, Evie Lacks, MD  montelukast (SINGULAIR) 10 MG tablet TAKE 1 TABLET(10 MG) BY MOUTH DAILY 12/24/20   Plotnikov, Evie Lacks, MD  naproxen (NAPROSYN) 500 MG tablet Take 500 mg by mouth 2 (two) times daily. 09/22/21   [provider]  pantoprazole (PROTONIX) 40 MG tablet Take 1 tablet (40 mg total) by mouth daily. Patient not taking: Reported on 10/17/2021 09/16/21   Plotnikov, Evie Lacks, MD  pravastatin (PRAVACHOL) 20 MG tablet TAKE 1 TABLET(20 MG) BY MOUTH DAILY Patient not taking: Reported on 10/17/2021 04/02/21   Plotnikov, Evie Lacks, MD  prednisoLONE acetate (PRED FORTE) 1 % ophthalmic suspension 1 drop 4 (four) times daily. 1 drop 4 (four) times daily. 01/20/21   [provider]  predniSONE (DELTASONE) 10 MG tablet Take 20 mg with breakfast and 10 mg with lunch x 1 week, then 20 mg q am x 1 week, then 10 mg q am pc 10/17/21   Plotnikov, Evie Lacks, MD  SANTYL 250 UNIT/GM ointment Apply topically. Patient not taking: Reported on 10/17/2021 10/16/21   [provider]  triamcinolone cream (KENALOG) 0.1 % Apply 1 application. topically 4 (four) times daily. 08/27/21   Plotnikov, Evie Lacks, MD  valsartan-hydrochlorothiazide (DIOVAN-HCT) 160-12.5 MG tablet TAKE 1 TABLET BY MOUTH DAILY 10/17/21   Plotnikov, Evie Lacks, MD  zolpidem (AMBIEN) 10 MG tablet Take 1 tablet (10 mg total) by mouth at bedtime as needed for sleep. 09/26/21   Plotnikov, Evie Lacks, MD    ___________________________________________________________________________________________________ Physical Exam:    10/28/2021   10:45 PM 10/28/2021   10:30 PM 10/28/2021   10:15 PM  Vitals with BMI  Systolic 397 91 88  Diastolic 67 55 56   Pulse 102       1. General:  in No  Acute distress   Chronically ill    -appearing 2. Psychological: Alert and   Oriented 3. Head/ENT:   Moist   Dry Mucous Membranes  Head Non traumatic, neck supple                           Poor Dentition 4. SKIN: decreased Skin turgor,  Skin clean Dry  multiple ulcers on both legs          Area of fluctuance     5. Heart: Regular rate and rhythm no  Murmur, no Rub or gallop 6. Lungs: , no wheezes or crackles   7. Abdomen: Soft,  non-tender, Non distended   bowel sounds present 8. Lower extremities: no clubbing, cyanosis,  edema R > L 9. Neurologically Grossly intact, moving all 4 extremities equally   10. MSK: Normal range of motion    Chart has been reviewed  ______________________________________________________________________________________________  Assessment/Plan  61 y.o. female with medical history significant of chronic bilateral lower extremity ulcers followed by rheumatology cause unclear, history of retinal vasculitis    Admitted for sepsis due to multiple leg ulcers and cellulitis   Present on Admission:  Severe sepsis ([place])  Dyslipidemia  Essential hypertension  Cellulitis  Hyponatremia  AKI (acute kidney injury) ([place])  Anemia     No problem-specific Assessment & Plan notes found for this encounter.    Other plan as per orders.  DVT prophylaxis:  SCD      Code Status:    Code Status: Not on file FULL CODE  as per patient   I had personally discussed CODE STATUS with patient and family     Family Communication:   Family   at  Bedside  plan of care was discussed   with  Daughter   Disposition Plan:      To home once workup is complete and patient is stable   Following barriers for discharge:                            Electrolytes corrected                               Anemia corrected                             Pain controlled with PO medications                                Afebrile, white count improving able to transition to PO antibiotics                             Will need to be able to tolerate PO                            Will likely need home health, home O2, set up                           Will need consultants to evaluate patient prior to discharge  ****EXPECT DC tomorrow                    ***Would benefit from PT/OT eval prior to DC  Ordered  Swallow eval - SLP ordered                   Diabetes care coordinator                   Transition of care consulted                   Nutrition    consulted                  Wound care  consulted                   Palliative care    consulted                   Behavioral health  consulted                    Consults called: ***  ID  Admission status:  ED Disposition     ED Disposition  [place]: [place] [[id number]]  Level of Care: Progressive [102]  Admit to Progressive based on following criteria: MULTISYSTEM THREATS such as stable sepsis, metabolic/electrolyte imbalance with or without encephalopathy that is responding to early treatment.  May admit patient to [facility] or [facility] if equivalent level of care is available:: No  Covid Evaluation: Asymptomatic - no recent exposure (last 10 days) testing not required  Diagnosis: Severe sepsis [facility]) [[id number]]  Admitting Physician: Toy Baker [[id number]]  Attending Physician: Toy Baker [[id number]]  Certification:: I certify this patient will need inpatient services for at least 2 midnights  Estimated Length of Stay: 2            inpatient     I Expect 2 midnight stay secondary to severity of patient's current illness need for inpatient interventions justified by the following: ***hemodynamic instability despite optimal treatment (tachycardia *hypotension * tachypnea *hypoxia, hypercapnia) * Severe lab/radiological/exam abnormalities including:      and extensive comorbidities including: *substance abuse  *Chronic pain *DM2  * CHF * CAD  * COPD/asthma *Morbid Obesity * CKD *dementia *liver disease *history of stroke with residual deficits *  malignancy, * sickle cell disease  History of amputation Chronic anticoagulation  That are currently affecting medical management.   I expect  patient to be hospitalized for 2 midnights requiring inpatient medical care.  Patient is at high risk for adverse outcome (such as loss of life or disability) if not treated.  Indication for inpatient stay as follows:  Severe change from baseline regarding mental status Hemodynamic instability despite maximal medical therapy,  ongoing suicidal ideations,  severe pain requiring acute inpatient management,  inability to maintain oral hydration   persistent chest pain despite medical management Need for operative/procedural  intervention New or worsening hypoxia   Need for IV antibiotics, IV fluids, IV rate controling medications, IV antihypertensives, IV pain medications, IV anticoagulation, need for biPAP    Level of care         progressive tele indefinitely please discontinue once patient no longer qualifies COVID-19 Labs    Keeghan Bialy 10/28/2021, 11:00 PM ***  [facility]     after 2 AM please page floor coverage PA If 7AM-7PM, please contact the day team taking care of the patient using [email or web address]   Patient was evaluated in the context of the global COVID-19 pandemic, which necessitated consideration that the patient might be  at risk for infection with the SARS-CoV-2 virus that causes COVID-19. Institutional protocols and algorithms that pertain to the evaluation of patients at risk for COVID-19 are in a state of rapid change based on information released by regulatory bodies including the CDC and federal and state organizations. These policies and algorithms were followed during the patient's care.

## 2021-10-28 NOTE — Subjective & Objective (Signed)
RLE pain and swelling chronic ulcers  On out patient Antibiotics and failed Initially had some low bp and was given 3L of fluids

## 2021-10-28 NOTE — ED Notes (Signed)
RN aware of pts BP.  

## 2021-10-28 NOTE — Assessment & Plan Note (Signed)
Permissive hypertension for tonight given hypotension

## 2021-10-28 NOTE — Assessment & Plan Note (Signed)
In the setting of sepsis dehydration obtain urine electrolytes Obtain UA Renal ultrasound

## 2021-10-28 NOTE — Progress Notes (Signed)
Pharmacy Antibiotic Note  Jessica Lynn is a 61 y.o. female for which pharmacy has been consulted for cefepime and vancomycin dosing for  wound infection . 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  SCr 3.3 - above baseline WBC 12.2; LA 1.6; T 97.9 F  Plan: Metronidazole per MD Cefepime 2g q24hr Vancomycin 1500 mg once, subsequent dosing as indicated per random vancomycin level until renal function stable and/or improved, at which time scheduled dosing can be considered Trend WBC, Fever, Renal function, & Clinical course F/u cultures, clinical course, WBC, fever De-escalate when able     Temp (24hrs), Avg:97.9 F (36.6 C), Min:97.9 F (36.6 C), Max:97.9 F (36.6 C)  Recent Labs  Lab 10/28/21 1628 10/28/21 1709  WBC 12.2*  --   CREATININE 2.98* 3.30*    Estimated Creatinine Clearance: 19 mL/min (A) (by C-G formula based on SCr of 3.3 mg/dL (H)).    Allergies  Allergen Reactions   Crestor [Rosuvastatin]     cramps   Oxycodone Itching    Antimicrobials this admission: cefepime 7/24 >>  vancomycin 7/24 >>  flagyl 7/24 >>   Microbiology results: Pending  Thank you for allowing pharmacy to be a part of this patient's care.  Lorelei Pont, PharmD, BCPS 10/28/2021 6:29 PM ED Clinical Pharmacist -  [phone]

## 2021-10-28 NOTE — ED Notes (Signed)
Pts BP was 77/51, NT notified triage RN and wheeled pt back to room 35.

## 2021-10-28 NOTE — ED Provider Notes (Signed)
[facility] EMERGENCY DEPARTMENT Provider Note   CSN: [id number] Arrival date & time: 10/28/21  1536     History PMH: HTN, HLD, GERD, Obesity Chief Complaint  Patient presents with   Abnormal Lab    Jessica Lynn is a 61 y.o. female. Patient presents due to an abnormal creatinine that was found during her rheumatology appointment. On 5/23, patient presented to her PCP for a itchy rash all over. She was put on Prednisone treatment which reportedly resolved the symptoms.  Approximately two days after the rash appeared, she started getting "knots" on bilateral lower extremities.   She was seen on 6/12 for this and there was concern for skin vasculitis.  She was started on high-dose steroid Deltasone 60 mg. Referred to wound and rheumatology. The knots started to become ulcerated and she was referred to Wound Clinic. On 7/10, she was evaluated by wound clinic.  Biopsy done. On 7/13, she was seen by PCP for tremors, lightheadedness, and malaise. Her steroids were tapered down at this time due to concern for side effect. Steroids stopped completely on 7/19. 7/18: Wound Clinic. ABI normal. 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 No vasculitis findings.   Patient has noted over the past week, she has had worsening redness, swelling, and pain in the right lower extremity. She also has endorsed frequent chills without known fevers. She completed a course of Augmentin yesterday with no improvement. Soft tissue US done yesterday concerning for large complex fluid collection within the subcutaneous soft tissues. Wound cultures have grown out staph.     Abnormal Lab      Home Medications Prior to Admission medications   Medication Sig Start Date End Date Taking? Authorizing Provider  amoxicillin-clavulanate (AUGMENTIN) 875-125 MG tablet Take 1 tablet by mouth 2 (two) times daily. 10/16/21   [provider]  brimonidine-timolol  (COMBIGAN) 0.2-0.5 % ophthalmic solution Place 1 drop into both eyes 2 times daily. Patient not taking: Reported on 10/17/2021 01/11/21   [provider]  Cholecalciferol (VITAMIN D3) 2000 units capsule Take 1 capsule (2,000 Units total) by mouth daily. 12/15/16   Plotnikov, Evie Lacks, MD  clonazePAM (KLONOPIN) 1 MG tablet Take 1/2 to 1  Tablet by mouth two times daily as needed for nerves Patient not taking: Reported on 10/17/2021 12/16/19   Plotnikov, Evie Lacks, MD  dorzolamide-timolol (COSOPT) 22.3-6.8 MG/ML ophthalmic solution 1 drop 2 (two) times daily. 1 drop 2 (two) times daily 11/20/20   [provider]  folic acid (FOLVITE) 1 MG tablet TAKE 1 TABLET(1 MG) BY MOUTH DAILY 05/20/21   Plotnikov, Evie Lacks, MD  furosemide (LASIX) 40 MG tablet Take 1 tablet (40 mg total) by mouth daily. 09/16/21 09/16/22  Plotnikov, Evie Lacks, MD  HYDROcodone-acetaminophen (NORCO/VICODIN) 5-325 MG tablet Take 1 tablet by mouth 3 (three) times daily as needed for severe pain. 10/17/21 10/17/22  Plotnikov, Evie Lacks, MD  HYDROcodone-acetaminophen (NORCO/VICODIN) 5-325 MG tablet Take 1 tablet by mouth 3 (three) times daily as needed for severe pain. 10/17/21 10/17/22  Plotnikov, Evie Lacks, MD  hydrOXYzine (ATARAX/VISTARIL) 25 MG tablet TAKE 1 TABLET BY MOUTH EVERY 8 HOURS AS NEEDED FOR ITCHING 11/30/20   Plotnikov, Evie Lacks, MD  KLOR-CON M20 20 MEQ tablet TAKE 1 TABLET BY MOUTH EVERY DAY 10/15/21   Plotnikov, Evie Lacks, MD  montelukast (SINGULAIR) 10 MG tablet TAKE 1 TABLET(10 MG) BY MOUTH DAILY 12/24/20   Plotnikov, Evie Lacks, MD  naproxen (NAPROSYN) 500 MG tablet Take  500 mg by mouth 2 (two) times daily. 09/22/21   [provider]  pantoprazole (PROTONIX) 40 MG tablet Take 1 tablet (40 mg total) by mouth daily. Patient not taking: Reported on 10/17/2021 09/16/21   Plotnikov, Evie Lacks, MD  pravastatin (PRAVACHOL) 20 MG tablet TAKE 1 TABLET(20 MG) BY MOUTH DAILY Patient not taking: Reported on 10/17/2021  04/02/21   Plotnikov, Evie Lacks, MD  prednisoLONE acetate (PRED FORTE) 1 % ophthalmic suspension 1 drop 4 (four) times daily. 1 drop 4 (four) times daily. 01/20/21   [provider]  predniSONE (DELTASONE) 10 MG tablet Take 20 mg with breakfast and 10 mg with lunch x 1 week, then 20 mg q am x 1 week, then 10 mg q am pc 10/17/21   Plotnikov, Evie Lacks, MD  SANTYL 250 UNIT/GM ointment Apply topically. Patient not taking: Reported on 10/17/2021 10/16/21   [provider]  triamcinolone cream (KENALOG) 0.1 % Apply 1 application. topically 4 (four) times daily. 08/27/21   Plotnikov, Evie Lacks, MD  valsartan-hydrochlorothiazide (DIOVAN-HCT) 160-12.5 MG tablet TAKE 1 TABLET BY MOUTH DAILY 10/17/21   Plotnikov, Evie Lacks, MD  zolpidem (AMBIEN) 10 MG tablet Take 1 tablet (10 mg total) by mouth at bedtime as needed for sleep. 09/26/21   Plotnikov, Evie Lacks, MD      Allergies    Crestor [rosuvastatin] and Oxycodone    Review of Systems   Review of Systems  Constitutional:  Positive for appetite change and chills. Negative for fever.  Cardiovascular:  Positive for leg swelling.  Skin:  Positive for color change and wound.  All other systems reviewed and are negative.   Physical Exam Updated Vital Signs BP 100/67   Pulse (!) 102   Temp 98.6 F (37 C) (Rectal)   Resp 17   LMP 06/06/2015 Comment: irregular   SpO2 98%  Physical Exam Vitals and nursing note reviewed.  Constitutional:      General: She is not in acute distress.    Appearance: Normal appearance. She is not ill-appearing, toxic-appearing or diaphoretic.  HENT:     Head: Normocephalic and atraumatic.     Nose: No nasal deformity.     Mouth/Throat:     Lips: Pink. No lesions.     Mouth: Mucous membranes are moist. No injury, lacerations, oral lesions or angioedema.     Pharynx: Oropharynx is clear. Uvula midline. No pharyngeal swelling, oropharyngeal exudate, posterior oropharyngeal erythema or uvula swelling.  Eyes:      General: Gaze aligned appropriately. No scleral icterus.       Right eye: No discharge.        Left eye: No discharge.     Conjunctiva/sclera: Conjunctivae normal.     Right eye: Right conjunctiva is not injected. No exudate or hemorrhage.    Left eye: Left conjunctiva is not injected. No exudate or hemorrhage.    Pupils: Pupils are equal, round, and reactive to light.  Cardiovascular:     Rate and Rhythm: Normal rate and regular rhythm.     Pulses: Normal pulses.          Radial pulses are 2+ on the right side and 2+ on the left side.       Dorsalis pedis pulses are 2+ on the right side and 2+ on the left side.     Heart sounds: Normal heart sounds, S1 normal and S2 normal. Heart sounds not distant. No murmur heard.    No friction rub. No gallop. No S3  or S4 sounds.  Pulmonary:     Effort: Pulmonary effort is normal. No accessory muscle usage or respiratory distress.     Breath sounds: Normal breath sounds. No stridor. No wheezing, rhonchi or rales.  Chest:     Chest wall: No tenderness.  Abdominal:     General: Abdomen is flat. Bowel sounds are normal. There is no distension.     Palpations: Abdomen is soft. There is no mass or pulsatile mass.     Tenderness: There is no abdominal tenderness. There is no guarding or rebound.  Musculoskeletal:     Right lower leg: No edema.     Left lower leg: No edema.  Skin:    General: Skin is warm and dry.     Coloration: Skin is not jaundiced or pale.     Findings: Erythema present. No bruising, lesion or rash.     Comments: Multiple ulcerated lesions on bilateral LE extending up in to mid thighs. RLE with surrounding erythema and swelling. Tender to touch. No crepitus or abscess felt.   Neurological:     General: No focal deficit present.     Mental Status: She is alert and oriented to person, place, and time.     GCS: GCS eye subscore is 4. GCS verbal subscore is 5. GCS motor subscore is 6.  Psychiatric:        Mood and Affect: Mood  normal.        Behavior: Behavior normal. Behavior is cooperative.     ED Results / Procedures / Treatments   Labs (all labs ordered are listed, but only abnormal results are displayed) Labs Reviewed  BASIC METABOLIC PANEL - Abnormal; Notable for the following components:      Result Value   Sodium 133 (*)    Glucose, Bld 115 (*)    BUN 40 (*)    Creatinine, Ser 2.98 (*)    GFR, Estimated 18 (*)    All other components within normal limits  CBC WITH DIFFERENTIAL/PLATELET - Abnormal; Notable for the following components:   WBC 12.2 (*)    RBC 2.78 (*)    Hemoglobin 9.7 (*)    HCT 28.6 (*)    MCV 102.9 (*)    MCH 34.9 (*)    Neutro Abs 8.5 (*)    Monocytes Absolute 1.1 (*)    Abs Immature Granulocytes 0.10 (*)    All other components within normal limits  LACTIC ACID, PLASMA - Abnormal; Notable for the following components:   Lactic Acid, Venous 2.4 (*)    All other components within normal limits  HEPATIC FUNCTION PANEL - Abnormal; Notable for the following components:   Total Protein 6.4 (*)    Albumin 3.1 (*)    All other components within normal limits  I-STAT CHEM 8, ED - Abnormal; Notable for the following components:   Sodium 130 (*)    BUN 37 (*)    Creatinine, Ser 3.30 (*)    Glucose, Bld 108 (*)    Hemoglobin 9.9 (*)    HCT 29.0 (*)    All other components within normal limits  CULTURE, BLOOD (ROUTINE X 2)  CULTURE, BLOOD (ROUTINE X 2)  URINE CULTURE  CULTURE, BLOOD (ROUTINE X 2)  CULTURE, BLOOD (ROUTINE X 2)  LACTIC ACID, PLASMA  PROTIME-INR  APTT  BRAIN NATRIURETIC PEPTIDE  URINALYSIS, ROUTINE W REFLEX MICROSCOPIC  LACTIC ACID, PLASMA  LACTIC ACID, PLASMA  APTT  COMPREHENSIVE METABOLIC PANEL  PROCALCITONIN  SEDIMENTATION RATE  C-REACTIVE PROTEIN    EKG None  Radiology DG Chest Port 1 View  Result Date: 10/28/2021 CLINICAL DATA:  Abnormal laboratory values EXAM: PORTABLE CHEST 1 VIEW COMPARISON:  08/18/2016 FINDINGS: The heart size and  mediastinal contours are within normal limits. Both lungs are clear. The visualized skeletal structures are unremarkable. IMPRESSION: No active disease. Electronically Signed   By: Donavan Foil M.D.   On: 10/28/2021 18:40    Procedures .Critical Care  Performed by: Adolphus Birchwood, PA-C Authorized by: Adolphus Birchwood, PA-C   Critical care provider statement:    Critical care time (minutes):  75   Critical care time was exclusive of:  Separately billable procedures and treating other patients   Critical care was necessary to treat or prevent imminent or life-threatening deterioration of the following conditions:  Circulatory failure, renal failure and sepsis   Critical care was time spent personally by me on the following activities:  Blood draw for specimens, development of treatment plan with patient or surrogate, discussions with consultants, examination of patient, evaluation of patient's response to treatment, discussions with primary provider, obtaining history from patient or surrogate, review of old charts, re-evaluation of patient's condition, pulse oximetry, ordering and review of radiographic studies, ordering and review of laboratory studies and ordering and performing treatments and interventions   Care discussed with: admitting provider     This patient was on telemetry or cardiac monitoring during their time in the ED.    Medications Ordered in ED Medications  vancomycin variable dose per unstable renal function (pharmacist dosing) (has no administration in time range)  ceFEPIme (MAXIPIME) 2 g in sodium chloride 0.9 % 100 mL IVPB (0 g Intravenous Stopped 10/28/21 2000)  metroNIDAZOLE (FLAGYL) IVPB 500 mg (0 mg Intravenous Stopped 10/28/21 2102)  lactated ringers bolus 1,000 mL (0 mLs Intravenous Stopped 10/28/21 2102)  vancomycin (VANCOREADY) IVPB 1500 mg/300 mL (0 mg Intravenous Stopped 10/28/21 2307)  lactated ringers bolus 1,000 mL (0 mLs Intravenous Stopped 10/28/21 2307)   lactated ringers bolus 1,000 mL (0 mLs Intravenous Stopped 10/28/21 2307)    ED Course/ Medical Decision Making/ A&P                            Medical Decision Making Amount and/or Complexity of Data Reviewed Labs: ordered. Radiology: ordered. ECG/medicine tests: ordered.  Risk Prescription drug management. Decision regarding hospitalization.    MDM  This is a 61 y.o. female who presents to the ED with abnormal lab  Initial Impression  Well Appearing in No acute distress. Hypotensive with SBP sustained in mid 70s to 80s. Afebrile. Not tachycardic. O2 well on RA. RLE cellulitis noted around wound bases.   Concern for sepsis given hypotension, evidence of end organ damage, and source of infection. Labs ordered. Initiated on broad spectrum IV abx. IVF given for low pressures.   I personally ordered, reviewed, and interpreted all laboratory work and imaging and agree with radiologist interpretation. Results interpreted below: wbc 12.2, hgb stable from baseline Na 133, Creat 2.98, BUN 40  Lactate normal, repeat 2.4 BNP normal Coags normal CXR normal Blood cultures pending.   Assessment/Plan:  - Patient is hypotensive. Suspect Sepsis. Lactate minimally elevated. Blood cultures are drawn. Broad spectrum IV abx given. Will reassess after IVF.  - RLE Cellulitis: started on broadspectrum IV antibiotics. No drainable abscess on my exam. - AKI: likely due to sepsis versus poor PO versus medication related (on recent Augmentin and long  steroid course). She will need further workup inpatient.  '@2028'$ , repeat BP with SBP in 80s. Patient is mentating fine. Only 0.5 L of LR have been infused so far, will reassess following IVF.   '@2130'$ , Hypotension has had minimal improvement after 2 L IVF. Will consult Critical care for need for possible need of pressors.   '@2140'$ , I discussed case with Critical care and spoke with Dr. Loanne Drilling.  Does not think patient currently needs pressors as her MAP  has been above 65. Recommends third bolus of IVF. Wants me to call back if pressures get worse, but likely will be a hospitalist admission.   '@2245'$ , After third Liter of fluid, pressures have improved with SBP in low 90s, MAP continues to be > 65. Will consult hospitalist for admission.   '@1102'$ , I discussed case with Dr. Orville Govern from [facility] who will evaluate patient for admission.     Charting Requirements Additional history is obtained from:  Independent historian External Records from outside source obtained and reviewed including: Reviewed wound care notes, PCP notes Social Determinants of Health:  none Pertinant PMH that complicates patient's illness: n/a  Patient Care Problems that were addressed during this visit: - Cellulitis: Acute illness with systemic symptoms - AKI: Acute illness with complication - Sepsis with acute renal failure and septic shock: Acute illness with systemic symptoms This patient was maintained on a cardiac monitor/telemetry. I personally viewed and interpreted the cardiac monitor which reveals an underlying rhythm of NSR Medications given in ED: 3 L Lactated Ringers, Vancomycin, Flagyl, Cefepime Reevaluation of the patient after these medicines showed that the patient improved I have reviewed home medications and made changes accordingly.  Critical Care Interventions: Fluid resuscitation. See procedure note Consultations: Critical Care, [facility].  Disposition: Admit  This is a shared visit with my attending physician, Dr. Rogene Houston.  We have discussed this patient and they have independently evaluated this patient. The plan was altered or changed as needed.  Portions of this note were generated with [employer]. Dictation errors may occur despite best attempts at proofreading.     Final Clinical Impression(s) / ED Diagnoses Final diagnoses:  Cellulitis of right lower extremity  AKI (acute kidney injury) ([place])  Sepsis with acute  renal failure and septic shock, due to unspecified organism, unspecified acute renal failure type [facility])    Rx / DC Orders ED Discharge Orders     None         Adolphus Birchwood, PA-C 10/28/21 2308    Fredia Sorrow, MD 10/29/21 2257

## 2021-10-28 NOTE — Assessment & Plan Note (Signed)
In the setting of infection and hypotension hypovolemia.  Will rehydrate and follow sodium obtain urine electrolytes Check TSH.  Patient is on prednisone

## 2021-10-28 NOTE — Assessment & Plan Note (Signed)
 -  SIRS criteria met with  elevated white blood cell count,       Component Value Date/Time   WBC 12.2 (H) 10/28/2021 1628   LYMPHSABS 2.4 10/28/2021 1628     tachycardia   ,  Today's Vitals   10/28/21 2202 10/28/21 2215 10/28/21 2230 10/28/21 2245  BP: 94/64 (!) 88/56 (!) 91/55 100/67  Pulse: 98   (!) 102  Resp: $Remo'16 17 17 17  'eUZHC$ Temp:      TempSrc:      SpO2: 97%   98%  PainSc:            The recent clinical data is shown below. Vitals:   10/28/21 2202 10/28/21 2215 10/28/21 2230 10/28/21 2245  BP: 94/64 (!) 88/56 (!) 91/55 100/67  Pulse: 98   (!) 102  Resp: $Remo'16 17 17 17  'gUpeu$ Temp:      TempSrc:      SpO2: 97%   98%        -Most likely source being:  Cellulitis, soft tissue infection     Patient meeting criteria for Severe sepsis with    evidence of end organ damage/organ dysfunction such as   Acute Kidney Injury with Cr > 2,  Lab Results  Component Value Date   CREATININE 3.30 (H) 10/28/2021   CREATININE 2.98 (H) 10/28/2021    elevated lactic acid >2     Component Value Date/Time   LATICACIDVEN 2.4 (HH) 10/28/2021 2133      SBP<90 mmhg or MAP < 65 mmhg,     Patient is meeting criteria for SEPTIC SHOCK with   SBP readings below 90 mmhg or MAP reading below 65 mmhg   - Obtain serial lactic acid and procalcitonin level.  - Initiated IV antibiotics in ER: Antibiotics Given (last 72 hours)    Date/Time Action Medication Dose Rate   10/28/21 1929 New Bag/Given   ceFEPIme (MAXIPIME) 2 g in sodium chloride 0.9 % 100 mL IVPB 2 g 200 mL/hr   10/28/21 1955 New Bag/Given   metroNIDAZOLE (FLAGYL) IVPB 500 mg 500 mg 100 mL/hr   10/28/21 1955 New Bag/Given   vancomycin (VANCOREADY) IVPB 1500 mg/300 mL 1,500 mg 150 mL/hr      Will continue  on : Cefepime Flagyl and Vanco   - await results of blood and urine culture  - Rehydrate aggressively  Intravenous fluids were administered,   30cc/kg fluid    11:07 PM

## 2021-10-28 NOTE — ED Notes (Signed)
Unable to obtain second set of cultures. Shirlee Limerick, [place] notified. Okay to start abx without second set.

## 2021-10-29 ENCOUNTER — Inpatient Hospital Stay (HOSPITAL_COMMUNITY): Payer: BC Managed Care – PPO

## 2021-10-29 DIAGNOSIS — N170 Acute kidney failure with tubular necrosis: Secondary | ICD-10-CM | POA: Diagnosis not present

## 2021-10-29 DIAGNOSIS — L0291 Cutaneous abscess, unspecified: Secondary | ICD-10-CM | POA: Diagnosis not present

## 2021-10-29 DIAGNOSIS — H209 Unspecified iridocyclitis: Secondary | ICD-10-CM

## 2021-10-29 DIAGNOSIS — R6520 Severe sepsis without septic shock: Secondary | ICD-10-CM

## 2021-10-29 DIAGNOSIS — A419 Sepsis, unspecified organism: Secondary | ICD-10-CM

## 2021-10-29 HISTORY — DX: Acute kidney failure with tubular necrosis: N17.0

## 2021-10-29 LAB — COMPREHENSIVE METABOLIC PANEL
ALT: 24 U/L (ref 0–44)
AST: 24 U/L (ref 15–41)
Albumin: 2.4 g/dL — ABNORMAL LOW (ref 3.5–5.0)
Alkaline Phosphatase: 63 U/L (ref 38–126)
Anion gap: 11 (ref 5–15)
BUN: 36 mg/dL — ABNORMAL HIGH (ref 6–20)
CO2: 21 mmol/L — ABNORMAL LOW (ref 22–32)
Calcium: 8.6 mg/dL — ABNORMAL LOW (ref 8.9–10.3)
Chloride: 101 mmol/L (ref 98–111)
Creatinine, Ser: 2.14 mg/dL — ABNORMAL HIGH (ref 0.44–1.00)
GFR, Estimated: 26 mL/min — ABNORMAL LOW (ref >60)
Glucose, Bld: 94 mg/dL (ref 70–99)
Potassium: 3.9 mmol/L (ref 3.5–5.1)
Sodium: 133 mmol/L — ABNORMAL LOW (ref 135–145)
Total Bilirubin: 0.9 mg/dL (ref 0.3–1.2)
Total Protein: 4.8 g/dL — ABNORMAL LOW (ref 6.5–8.1)

## 2021-10-29 LAB — IRON AND TIBC
Iron: 38 ug/dL (ref 28–170)
Saturation Ratios: 14 % (ref 10.4–31.8)
TIBC: 263 ug/dL (ref 250–450)
UIBC: 225 ug/dL

## 2021-10-29 LAB — OSMOLALITY, URINE: Osmolality, Ur: 211 mOsm/kg — ABNORMAL LOW (ref 300–900)

## 2021-10-29 LAB — C-REACTIVE PROTEIN: CRP: 0.6 mg/dL (ref <1.0)

## 2021-10-29 LAB — CREATININE, URINE, RANDOM: Creatinine, Urine: 65 mg/dL

## 2021-10-29 LAB — SEDIMENTATION RATE: Sed Rate: 47 mm/hr — ABNORMAL HIGH (ref 0–22)

## 2021-10-29 LAB — MAGNESIUM: Magnesium: 1.7 mg/dL (ref 1.7–2.4)

## 2021-10-29 LAB — RETICULOCYTES
Immature Retic Fract: 13 % (ref 2.3–15.9)
RBC.: 2.29 MIL/uL — ABNORMAL LOW (ref 3.87–5.11)
Retic Count, Absolute: 21.3 10*3/uL (ref 19.0–186.0)
Retic Ct Pct: 0.9 % (ref 0.4–3.1)

## 2021-10-29 LAB — LACTIC ACID, PLASMA
Lactic Acid, Venous: 1.3 mmol/L (ref 0.5–1.9)
Lactic Acid, Venous: 1 mmol/L (ref 0.5–1.9)

## 2021-10-29 LAB — CK: Total CK: 110 U/L (ref 38–234)

## 2021-10-29 LAB — VITAMIN B12: Vitamin B-12: 303 pg/mL (ref 180–914)

## 2021-10-29 LAB — HIV ANTIBODY (ROUTINE TESTING W REFLEX): HIV Screen 4th Generation wRfx: NONREACTIVE

## 2021-10-29 LAB — FERRITIN: Ferritin: 520 ng/mL — ABNORMAL HIGH (ref 11–307)

## 2021-10-29 LAB — OSMOLALITY: Osmolality: 292 mOsm/kg (ref 275–295)

## 2021-10-29 LAB — APTT: aPTT: 22 seconds — ABNORMAL LOW (ref 24–36)

## 2021-10-29 LAB — PROCALCITONIN: Procalcitonin: <0.1 ng/mL

## 2021-10-29 LAB — PHOSPHORUS: Phosphorus: 3.8 mg/dL (ref 2.5–4.6)

## 2021-10-29 LAB — FOLATE: Folate: >40 ng/mL (ref >5.9)

## 2021-10-29 MED ORDER — SODIUM CHLORIDE 0.9 % IV SOLN: INTRAVENOUS | Status: AC

## 2021-10-29 MED ORDER — SODIUM CHLORIDE 0.9 % IV SOLN
8.0000 mg/kg | Freq: Every day | INTRAVENOUS | Status: DC
  Filled 2021-10-29: qty 10

## 2021-10-29 MED ORDER — LACTATED RINGERS IV BOLUS
500.0000 mL | Freq: Once | INTRAVENOUS | Status: AC
Start: 2021-10-29 — End: 2021-10-29
  Administered 2021-10-29: 500 mL via INTRAVENOUS

## 2021-10-29 MED ORDER — LACTATED RINGERS IV BOLUS
500.0000 mL | Freq: Once | INTRAVENOUS | Status: AC
  Administered 2021-10-29: 500 mL via INTRAVENOUS

## 2021-10-29 MED ORDER — LACTATED RINGERS IV BOLUS
1000.0000 mL | Freq: Once | INTRAVENOUS | Status: AC
  Administered 2021-10-29: 1000 mL via INTRAVENOUS

## 2021-10-29 MED ORDER — COLLAGENASE 250 UNIT/GM EX OINT
TOPICAL_OINTMENT | Freq: Every day | CUTANEOUS | Status: DC
  Administered 2021-11-04: 1 via TOPICAL
  Filled 2021-10-29: qty 30

## 2021-10-29 MED ORDER — SODIUM CHLORIDE 0.9 % IV SOLN
8.0000 mg/kg | INTRAVENOUS | Status: DC
  Administered 2021-10-29: 500 mg via INTRAVENOUS
  Filled 2021-10-29: qty 10

## 2021-10-29 MED ORDER — LORAZEPAM 0.5 MG PO TABS
0.5000 mg | ORAL_TABLET | Freq: Once | ORAL | Status: AC
  Administered 2021-10-29: 0.5 mg via ORAL
  Filled 2021-10-29: qty 1

## 2021-10-29 NOTE — ED Notes (Signed)
Changed cuff and observed pt to ensure she was not moving while pressure was being taken.  Latest pressure with observation was 90/58 w/ map of 68.[name]  Pt is mentating well.

## 2021-10-29 NOTE — Progress Notes (Signed)
PROGRESS NOTE    Jessica Lynn  OJJ:[id number] DOB: 1960/12/20 DOA: 10/28/2021 PCP: Cassandria Anger, MD    Brief Narrative:  61 year old with history of chronic bilateral extremity ulcers, retinal vasculitis presented to the emergency room with abnormal kidney functions called by her rheumatologist.  Patient is taken care of at wound care clinic and also followed by rheumatologist.  Recently treated with Humira and subsequently with prednisone that was tapered off.  She went to rheumatologist yesterday who did a routine blood test and found to have AKI, labs unavailable with previous normal renal functions so I asked to go to emergency room.  Recently taken Augmentin.  Seen at wound care clinic few days ago. Initially in the emergency room she was noted to be hypotensive requiring 4 L of fluid, blood cultures were drawn which was started on antibiotics.   Assessment & Plan:   Severe sepsis present on admission: Likely secondary to superadded infection, leg cellulitis: Presented with hypotension, lactic acidosis and WBC count of 12.2.  Started on multiple antibiotics.  Taper to daptomycin as per ID recommendation. Right leg x-ray with soft tissue swelling. Right leg ultrasound ordered by wound care center, soft tissue swelling lateral aspect of the right leg. MRI of the leg today to rule out abscess or osteomyelitis.  We will ask for surgical consultation if any significant abscess. Local wound care as instructed by wound care team.  Acute kidney injury: Prerenal in the setting of sepsis and hypotension.  Blood pressures already improving.  Kidney function is already improving. Patient was on losartan hydrochlorothiazide as well as Lasix at home. Continue maintenance IV fluids today.  Renal ultrasound essentially normal.  Intake and output monitoring.  Essential hypertension: Blood pressures low normal.  Hold all antihypertensives.     DVT prophylaxis: SCDs Start: 10/28/21  2326   Code Status: Full code Family Communication: Daughter at the bedside Disposition Plan: Status is: Inpatient Remains inpatient appropriate because: Significant renal function abnormalities, leg wounds.     Consultants:  Infectious disease Wound care  Procedures:  None  Antimicrobials:  Multiple, currently on daptomycin today.   Subjective: Patient was seen and examined.  Has moderate pain on the right leg.  Afebrile.  Blood pressure stabilized.  Daughter at the bedside.  Explains that patient has not been eating or drinking well for some time but continue to take blood pressure medications and diuretics.  Objective: Vitals:   10/29/21 1230 10/29/21 1300 10/29/21 1335 10/29/21 1400  BP: (!) 89/57 (!) 84/73 (!) 90/58 (!) 86/52  Pulse: 81 86 87 79  Resp: '17 16 12 16  '$ Temp:      TempSrc:      SpO2: 100% 100% 100% 100%    Intake/Output Summary (Last 24 hours) at 10/29/2021 1435 Last data filed at 10/29/2021 0231 Gross per 24 hour  Intake --  Output 750 ml  Net -750 ml   There were no vitals filed for this visit.  Examination:  General exam: Appears calm and comfortable  Frail and debilitated.  Chronically sick looking.  Not in any distress. Respiratory system: No added sounds. Cardiovascular system: S1 & S2 heard, RRR.  Gastrointestinal system: Soft.  Nontender.  Bowel sound present. Central nervous system: Alert and oriented. No focal neurological deficits. Extremities: Symmetric 5 x 5 power. Skin:  Multiple onset of the skin lesions clean and dry as in pictures. Tender boggy swelling on the lateral aspect of the right leg.  Picture in the chart.  Data Reviewed: I have personally reviewed following labs and imaging studies  CBC: Recent Labs  Lab 10/28/21 1628 10/28/21 1709  WBC 12.2*  --   NEUTROABS 8.5*  --   HGB 9.7* 9.9*  HCT 28.6* 29.0*  MCV 102.9*  --   PLT 193  --    Basic Metabolic Panel: Recent Labs  Lab 10/28/21 1628  10/28/21 1709 10/29/21 0032  NA 133* 130* 133*  K 4.3 4.3 3.9  CL 99 98 101  CO2 23  --  21*  GLUCOSE 115* 108* 94  BUN 40* 37* 36*  CREATININE 2.98* 3.30* 2.14*  CALCIUM 9.2  --  8.6*  MG  --   --  1.7  PHOS  --   --  3.8   GFR: Estimated Creatinine Clearance: 29.3 mL/min (A) (by C-G formula based on SCr of 2.14 mg/dL (H)). Liver Function Tests: Recent Labs  Lab 10/28/21 1814 10/29/21 0032  AST 31 24  ALT 32 24  ALKPHOS 78 63  BILITOT 0.9 0.9  PROT 6.4* 4.8*  ALBUMIN 3.1* 2.4*   No results for input(s): "LIPASE", "AMYLASE" in the last 168 hours. No results for input(s): "AMMONIA" in the last 168 hours. Coagulation Profile: Recent Labs  Lab 10/28/21 1814  INR 1.1   Cardiac Enzymes: Recent Labs  Lab 10/29/21 0032  CKTOTAL 110   BNP (last 3 results) No results for input(s): "PROBNP" in the last 8760 hours. HbA1C: No results for input(s): "HGBA1C" in the last 72 hours. CBG: No results for input(s): "GLUCAP" in the last 168 hours. Lipid Profile: No results for input(s): "CHOL", "HDL", "LDLCALC", "TRIG", "CHOLHDL", "LDLDIRECT" in the last 72 hours. Thyroid Function Tests: No results for input(s): "TSH", "T4TOTAL", "FREET4", "T3FREE", "THYROIDAB" in the last 72 hours. Anemia Panel: Recent Labs    10/29/21 0032  VITAMINB12 303  FOLATE >40.0  FERRITIN 520*  TIBC 263  IRON 38  RETICCTPCT 0.9   Sepsis Labs: Recent Labs  Lab 10/28/21 1814 10/28/21 2133 10/29/21 0032 10/29/21 0414  PROCALCITON  --   --  <0.10  --   LATICACIDVEN 1.6 2.4* 1.3 1.0    Recent Results (from the past 240 hour(s))  Blood Culture (routine x 2)     Status: None (Preliminary result)   Collection Time: 10/28/21  6:45 PM   Specimen: Right Antecubital; Blood  Result Value Ref Range Status   Specimen Description RIGHT ANTECUBITAL  Final   Special Requests   Final    BOTTLES DRAWN AEROBIC AND ANAEROBIC Blood Culture adequate volume   Culture   Final    NO GROWTH < 12  HOURS Performed at [facility], [place] [address], [place], [place] [address]    Report Status PENDING  Incomplete         Radiology Studies: DG Tibia/Fibula Right  Result Date: 10/28/2021 CLINICAL DATA:  Swelling of the right lower extremity EXAM: RIGHT TIBIA AND FIBULA - 2 VIEW COMPARISON:  None Available. FINDINGS: No acute displaced fracture or malalignment. Generalized edema within the subcutaneous soft tissues. Wound or ulcer along the medial aspect of the lower leg and slightly above the ankle. No soft tissue emphysema IMPRESSION: 1. No acute osseous abnormality 2. Marked soft tissue edema with suspected wound or ulcer along the medial aspect of the lower leg Electronically Signed   By: Donavan Foil M.D.   On: 10/28/2021 23:54   US RENAL  Result Date: 10/28/2021 CLINICAL DATA:  Acute kidney injury EXAM: RENAL / URINARY  TRACT ULTRASOUND COMPLETE COMPARISON:  None Available. FINDINGS: Right Kidney: Renal measurements: 10.3 x 4.9 x 5 cm = volume: 131 mL. Echogenicity within normal limits. No mass or hydronephrosis visualized. Left Kidney: Renal measurements: 9.4 x 5.6 x 5.4 cm = volume: 148 mL. Small cysts demonstrated along the midpole and medial midpole of the left kidney. Largest measures 1.5 cm in diameter. Appearance is consistent with benign cyst. No additional imaging follow-up is indicated. Echogenicity within normal limits. No solid mass or hydronephrosis visualized. Bladder: Appears normal for degree of bladder distention. Other: None. IMPRESSION: Benign-appearing left renal cysts. No hydronephrosis in the kidneys. Electronically Signed   By: Lucienne Capers M.D.   On: 10/28/2021 23:53   DG Chest Port 1 View  Result Date: 10/28/2021 CLINICAL DATA:  Abnormal laboratory values EXAM: PORTABLE CHEST 1 VIEW COMPARISON:  08/18/2016 FINDINGS: The heart size and mediastinal contours are within normal limits. Both lungs are clear. The visualized skeletal structures are unremarkable.  IMPRESSION: No active disease. Electronically Signed   By: Donavan Foil M.D.   On: 10/28/2021 18:40        Scheduled Meds:  collagenase   Topical Daily   dorzolamide-timolol  1 drop Both Eyes BID   LORazepam  0.5 mg Oral Once   montelukast  10 mg Oral QHS   Continuous Infusions:  sodium chloride 125 mL/hr at 10/29/21 1056   DAPTOmycin (CUBICIN) 500 mg in sodium chloride 0.9 % IVPB       LOS: 1 day    Time spent: 35 minutes    Barb Merino, MD [facility] Pager [phone]

## 2021-10-29 NOTE — Consult Note (Signed)
WOC Nurse Consult Note: Reason for Consult:Multiple full thickness lesions to bilateral LEs. Seen by outpatient [place] on 7/18 by Dr. Celine Ahr.  Biopsy (derm pathologist) indicates lipodermatosclerosis.Debridement of all wounds at 7/18 encounter and POC per Dr. Celine Ahr with collagenase [facility]) once daily.  Wound type:full thickness Pressure Injury POA: N/A Measurement:Per Dr. Glenford Peers note on 10/22/21: Left distal anterior LE: 2.3cm x 2.3cm x 0.1cm Left anterior LE: 5.3cm x 2cm x 0.1cm Left lateral LE: 1.9cm x 1.3cm x 0.1cm Left lateral anterior LE: 1.2cm x 0.8cm x 0.1cm Left medial LE: 2cm x 1.9cm x 0.2cm Right medial LE: 1.3cm x 1.2cm x 0.1cm Right distal Medial LE: 0.7cm x 0.4cm x 0.1cm Wound bed: Red, moist Drainage (amount, consistency, odor) Small serous Dressing procedure/placement/frequency:I will continue the POC implemented by Dr. Celine Ahr for daily soap and water cleanse, rinse and pat dry followed by the application of collagenase (Santyl) topped with a saline dampened gauze dressing. This is to be secured with a silicone foam. The LEs are to be wrapped using a 4-inch ACE bandage applied from just below toes to just below knees.  Patient is to follow up with outpatient [place] (Dr. Celine Ahr) as she was directed post discharge.  [place] nursing team will not follow, but will remain available to this patient, the nursing and medical teams.  Please re-consult if needed.  Thank you for inviting [place] to participate in this patient's Plan of Care.  Maudie Flakes, MSN, RN, CNS, [place], Serita Grammes, [employer], [employer] phone:  [phone]

## 2021-10-29 NOTE — Progress Notes (Signed)
Inpatient Rehab Admissions Coordinator Note:   Per therapy recommendations patient was screened for CIR candidacy by Michel Santee, PT. At this time, pt appears to be a potential candidate for CIR. I will place an order for rehab consult for full assessment, per our protocol.  Please contact me any with questions.Shann Medal, PT, DPT [phone] 10/29/21 2:36 PM

## 2021-10-29 NOTE — Evaluation (Signed)
Occupational Therapy Evaluation Patient Details Name: Jessica Lynn MRN: [id number] DOB: May 12, 1960 Today's Date: 10/29/2021   History of Present Illness Pt is a 61 y/o female admitted 7/24 secondary to increased swelling in RLE and chronic wounds on BLE. Thought to be secondary to cellulitis; workup pending. PMH includes HTN and GERD.   Clinical Impression   PTA patient was living with her spouse in a private residence and was grossly independent with ADLs/IADLs without AD. 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 Patient currently functioning below baseline requiring Max A for LB ADLs and Min A +2 for sit to stand transfers with use of RW. Limited by 10/10 pain in BLE RLE>LLE. Patient would benefit from continued acute OT services in prep for safe d/c to next level of care. Current recommendation for inpatient rehab as patient is unable to ambulate. Likely to progress closer to baseline level of function with treatment of infection. OT will continue to follow acutely.        Recommendations for follow up therapy are one component of a multi-disciplinary discharge planning process, led by the attending physician.  Recommendations may be updated based on patient status, additional functional criteria and insurance authorization.   Follow Up Recommendations  Acute inpatient rehab (3hours/day)    Assistance Recommended at Discharge Frequent or constant Supervision/Assistance  Patient can return home with the following A lot of help with walking and/or transfers;A lot of help with bathing/dressing/bathroom;Assistance with cooking/housework;Assist for transportation;Help with stairs or ramp for entrance    Functional Status Assessment  Patient has had a recent decline in their functional status and demonstrates the ability to make significant improvements in function in a reasonable and predictable amount of time.  Equipment Recommendations  BSC/3in1;Other (comment) (rolling walker)     Recommendations for Other Services Rehab consult     Precautions / Restrictions Precautions Precautions: Fall Restrictions Weight Bearing Restrictions: No      Mobility Bed Mobility Overal bed mobility: Needs Assistance Bed Mobility: Supine to Sit, Sit to Supine     Supine to sit: Min assist, +2 for safety/equipment Sit to supine: Min assist, +2 for safety/equipment   General bed mobility comments: Min A for trunk elevation to come to sitting. Min A for RLE assist for return to supine.    Transfers Overall transfer level: Needs assistance Equipment used: 2 person hand held assist, Rolling walker (2 wheels) Transfers: Sit to/from Stand Sit to Stand: Min assist, +2 safety/equipment           General transfer comment: Performed standing X2. Unable to bear weight through RLE.  Unable to take hop steps or pivotal steps towards [facility] secondary to increased pain in RLE.      Balance Overall balance assessment: Needs assistance Sitting-balance support: No upper extremity supported, Feet supported Sitting balance-[name]: Fair     Standing balance support: Bilateral upper extremity supported Standing balance-[name]: Poor Standing balance comment: Reliant on UE and external support                           ADL either performed or assessed with clinical judgement   ADL Overall ADL's : Needs assistance/impaired Eating/Feeding: Independent   Grooming: Set up Grooming Details (indicate cue type and reason): Seated EOB; limited by BLE edema and severe pain.         Upper Body Dressing : Set up Upper Body Dressing Details (indicate cue type and reason): Donned posterior  hospital gown seated EOB Lower Body Dressing: Maximal assistance Lower Body Dressing Details (indicate cue type and reason): Limited by severe BLE pain               General ADL Comments: Greatly limited by BLE pain RLE>LLE. Unable to ambulate.     Vision Baseline  Vision/History: 4 Cataracts Ability to See in Adequate Light: 1 Impaired Patient Visual Report: No change from baseline       Perception     Praxis      Pertinent Vitals/Pain Pain Assessment Pain Assessment: 0-10 Pain Score: 10-Worst pain ever Pain Location: LLE Pain Descriptors / Indicators: Sharp, Throbbing Pain Intervention(s): Limited activity within patient's tolerance, Monitored during session, Repositioned     Hand Dominance     Extremity/Trunk Assessment Upper Extremity Assessment Upper Extremity Assessment: Overall WFL for tasks assessed   Lower Extremity Assessment Lower Extremity Assessment: Defer to PT evaluation RLE Deficits / Details: Increased swelling and redness in shin through foot. Limited ankle ROM. Increased pain in dependent position. RLE Sensation: decreased light touch LLE Deficits / Details: Multiple wounds noted on shin   Cervical / Trunk Assessment Cervical / Trunk Assessment: Normal   Communication Communication Communication: No difficulties   Cognition Arousal/Alertness: Awake/alert Behavior During Therapy: WFL for tasks assessed/performed Overall Cognitive Status: Within Functional Limits for tasks assessed                                       General Comments  BP in supine at 90/59, in sitting at 105/79 and after standing 96/49.    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 List: Decreased knowledge of use of DME or AE;Pain;Increased edema      OT Treatment/Interventions: Self-care/ADL training;Therapeutic exercise;Energy conservation;DME and/or AE instruction;Therapeutic activities;Patient/family education;Balance training    OT Goals(Current goals can be found in the care plan section) Acute Rehab OT Goals Patient Stated Goal: To decrease pain OT Goal Formulation: With patient Time For Goal Achievement: 11/12/21 Potential to Achieve Goals: Good  OT Frequency: Min 2X/week    Co-evaluation PT/OT/SLP Co-Evaluation/Treatment: Yes Reason for Co-Treatment: For patient/therapist safety;To address functional/ADL transfers PT goals addressed during session: Mobility/safety with mobility;Balance OT goals addressed during session: ADL's and self-care      AM-PAC OT "6 Clicks" Daily Activity     Outcome Measure Help from another person eating meals?: None Help from another person taking care of personal grooming?: A Little Help from another person toileting, which includes using toliet, bedpan, or urinal?: A Lot Help from another person bathing (including washing, rinsing, drying)?: A Lot Help from another person to put on and taking off regular upper body clothing?: A Little Help from another person to put on and taking off regular lower  body clothing?: A Lot 6 Click Score: 16   End of Session Equipment Utilized During Treatment: Gait belt Nurse Communication: Mobility status  Activity Tolerance: Patient limited by pain Patient left: in bed;with call bell/phone within reach;with family/visitor present  OT Visit Diagnosis: Unsteadiness on feet (R26.81);Pain Pain - Right/Left: Right (Bilateral) Pain - part of body: Leg                Time: 3500-9381 OT Time Calculation (min): 18 min Charges:  OT General Charges $OT Visit: 1 Visit OT  Evaluation $OT Eval Moderate Complexity: 1 Mod  Dabid [place] H. OTR/L Supplemental OT, Department of rehab services [phone]  Jillisa Harris R H. 10/29/2021, 12:52 PM

## 2021-10-29 NOTE — Consult Note (Signed)
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 Since she had gone off prednisone her jittery/tremors had resolved but hasn't been feeling like eating/drinking much  She was evaluated by rheum a day prior to presentation and advised to come to ed for AKI. Incidentally she has been complaining of right calf severe pain/swelling for a week. Her blood pressure has been low here and she has a mild leukocytosis on presentation. No fever documented  She has chronic bilateral LE ulcers that are improving even off prednisone. She recently in June developed some kind of hives like reaction to ?humira and some of the lesions ulcerated in the bilateral LE but again improved   #sepsis #right calf swelling concerned for staphylococcal abscess/myositis I do not appreciate obvious sign of adrenal crisis in setting of her recent high dose/long term prednisone use Will get mri to help with diagnosis Will transition abx to reflect concern Bcx obtained in ed 1 set only; await result  #chronic bilateral leg ulcers Improving.  She saw rheumatology 7/24 for evaluation so will need to f/u.  With reported improvement and off immunosuppression and no abx prior. Doesn't appear to be vasculitis or infectious.  #aki Suspect due to dehydration given quick improvement. Possibly some sepsis component as well If improvement nadirs, would consider secondary nephritic syndrome or renal vasculitis given her known retinal  vasculitis/uveitis    Plan: Change abx to daptomycin Follow up blood cultures  Mri right calf Renal supportive care per primary team Wound care for chronic ulcer per primary team Her ophthalmologist is awared of her tapering off immunosuppresion -- she'll need to f/u ophthalmology after discharge Will also need rheum f/u after discharge Discussed with team   I spent 75 minute reviewing data/chart, and coordinating care and >50% direct face to face time providing counseling/discussing diagnostics/treatment plan with patient      ------------------------------------------------ Principal Problem:   Severe sepsis ([place]) Active Problems:   Essential hypertension   Dyslipidemia   Cellulitis   Hyponatremia   AKI (acute kidney injury) ([place])   Anemia   Acute kidney injury (AKI) with acute tubular necrosis (ATN) ([place])    HPI: Jessica Lynn is a 61 y.o. female chronic bilateral uveitis, chronic legs ulcer, admitted due to rheum referal for concern of aki, also reported 1 week right calf swelling/pain  Reviewed chart  She denies fever chill but reported 1 week of worsening right lateral calf pain/swelling. Pain severe difficult to bear weight  The reason she actually came for admission was because yesterday rheumatology had obtained labs and was concerned about her renal function so asked for her to be evaluated in ED  She is followed by ophthalmology for bilateral uveitis/retinal vasculitis. She has been on courses of prednisone and also getting periodic humira injection. She developed a hive like rash in June and had asked to be taken off humira. Unclear if the prednisone was restarted (recently for that reason and off humira) but patient reported improving hives.  However some of the hives like lesion had became ulcerated (in her bilateral legs). She said they have been improving. She had also tapered off prednisone completely about 2-3 weeks prior to this admission  She has no  changes in her vision recently She nas no new rash outside of improving legs ulcers and right calf pain/swelling No GU sx She denies arthralgia/myalgia, n/v/abd pain/diarrhea, cough, chest pain, dysphagia, raynaud's history  Her blood pressure os normally 161W systolic at home. She does take hypertensive medication  On presentation: Afebrile; sbp intermittently 80s-90s Wbc 12 Lactate 2.4 Cr 2.3 Interestingly crp only 0.6 Lft wnl Cxr clear Right tib-fib xray with tissue edema  She was started on bsAbx for concern of sepsis Bcx ngtd    Family History  Problem Relation Age of Onset   Colon cancer Brother 60   Colon polyps Mother    Colon polyps Father    Colon cancer Maternal Uncle    Esophageal cancer Neg Hx    Rectal cancer Neg Hx    Stomach cancer Neg Hx     Social History   Tobacco Use   Smoking status: Former    Packs/day: 0.20    Years: 4.00    Total pack years: 0.80    Types: Cigarettes    Quit date: 04/07/2006    Years since quitting: 15.5   Smokeless tobacco: Never  Vaping Use   Vaping Use: Never used  Substance Use Topics   Alcohol use: No    Alcohol/week: 0.0 standard drinks of alcohol    Comment: social use   Drug use: No    Allergies  Allergen Reactions   Crestor [Rosuvastatin] Other (See Comments)    cramps   Oxycodone Itching    Review of Systems: ROS All Other ROS was negative, except mentioned above   Past Medical History:  Diagnosis Date   Allergic rhinitis    year around    Allergy    Atypical chest pain    GERD (gastroesophageal reflux disease)    Hypercholesterolemia    Mild hypertension    Obesity    Postherpetic neuralgia    from shingles   Shingles    Snoring    Somatic dysfunction        Scheduled Meds:  collagenase   Topical Daily   dorzolamide-timolol  1 drop Both Eyes BID   montelukast  10 mg Oral QHS   Continuous Infusions:  sodium chloride     ceFEPime (MAXIPIME) IV Stopped (10/28/21 2000)    metronidazole Stopped (10/29/21 0733)   PRN Meds:.acetaminophen **OR** acetaminophen, HYDROcodone-acetaminophen   OBJECTIVE: Blood pressure 91/64, pulse 94, temperature 97.9 F (36.6 C), temperature source Oral, resp. rate 15, last menstrual period 06/06/2015, SpO2 100 %.  Physical Exam General/constitutional: no distress, pleasant HEENT: Normocephalic, PER, Conj Clear, EOMI, Oropharynx clear Neck supple CV: rrr no mrg Lungs: clear to auscultation, normal respiratory effort Abd: Soft, Nontender Ext/skin; focal swelling/warmth/tenderness lateroposterior right calf; several clean based healing ulcers bilateral anteromedial aspects of lower extremities that are nontender Neuro: nonfocal MSK: no peripheral joint swelling/tenderness/warmth; back spines nontender     Lab Results Lab Results  Component Value Date   WBC 12.2 (H) 10/28/2021   HGB 9.9 (L) 10/28/2021   HCT 29.0 (L) 10/28/2021   MCV 102.9 (H) 10/28/2021   PLT 193 10/28/2021    Lab Results  Component Value Date   CREATININE 2.14 (H) 10/29/2021   BUN 36 (H) 10/29/2021   NA 133 (L) 10/29/2021  K 3.9 10/29/2021   CL 101 10/29/2021   CO2 21 (L) 10/29/2021    Lab Results  Component Value Date   ALT 24 10/29/2021   AST 24 10/29/2021   ALKPHOS 63 10/29/2021   BILITOT 0.9 10/29/2021      Microbiology: Recent Results (from the past 240 hour(s))  Blood Culture (routine x 2)     Status: None (Preliminary result)   Collection Time: 10/28/21  6:45 PM   Specimen: Right Antecubital; Blood  Result Value Ref Range Status   Specimen Description RIGHT ANTECUBITAL  Final   Special Requests   Final    BOTTLES DRAWN AEROBIC AND ANAEROBIC Blood Culture adequate volume   Culture   Final    NO GROWTH < 12 HOURS Performed at [facility], [address], [place], [place] [address]    Report Status PENDING  Incomplete     Serology:    Imaging: If present, new imagings (plain films, ct scans, and mri) have been  personally visualized and interpreted; radiology reports have been reviewed. Decision making incorporated into the Impression / Recommendations.  7/24 cxr FINDINGS: The heart size and mediastinal contours are within normal limits. Both lungs are clear. The visualized skeletal structures are unremarkable.   IMPRESSION: No active disease.   7/24 xray right tib-fib FINDINGS: No acute displaced fracture or malalignment. Generalized edema within the subcutaneous soft tissues. Wound or ulcer along the medial aspect of the lower leg and slightly above the ankle. No soft tissue emphysema   IMPRESSION: 1. No acute osseous abnormality 2. Marked soft tissue edema with suspected wound or ulcer along the medial aspect of the lower leg   Jabier Mutton, [place] [facility] [phone] pager    10/29/2021, 10:12 AM

## 2021-10-29 NOTE — Evaluation (Signed)
Physical Therapy Evaluation Patient Details Name: Jessica Lynn MRN: [id number] DOB: 1960/06/11 Today's Date: 10/29/2021  History of Present Illness  Pt is a 61 y/o female admitted 7/24 secondary to increased swelling in RLE and chronic wounds on BLE. Thought to be secondary to cellulitis; workup pending. PMH includes HTN and GERD.  Clinical Impression  Pt admitted secondary to problem above with deficits below. Pt with increased pain this session, especially with RLE in dependent position, so mobility limited. Was able to stand X2 with min A, however, unable to take steps. Pt was previously very independent with mobility prior to admission. Given current deficits feel pt would benefit from AIR level therapies, however, if pt progresses well, may be able to consider HHPT. Will continue to follow acutely and update recommendations based on pt progression.       Recommendations for follow up therapy are one component of a multi-disciplinary discharge planning process, led by the attending physician.  Recommendations may be updated based on patient status, additional functional criteria and insurance authorization.  Follow Up Recommendations Acute inpatient rehab (3hours/day) (vs HHPT pending progression)      Assistance Recommended at Discharge Intermittent Supervision/Assistance  Patient can return home with the following  A lot of help with walking and/or transfers;A lot of help with bathing/dressing/bathroom;Assistance with cooking/housework;Help with stairs or ramp for entrance;Assist for transportation    Equipment Recommendations Rolling walker (2 wheels);BSC/3in1  Recommendations for Other Services       Functional Status Assessment Patient has had a recent decline in their functional status and demonstrates the ability to make significant improvements in function in a reasonable and predictable amount of time.     Precautions / Restrictions Precautions Precautions:  Fall Restrictions Weight Bearing Restrictions: No      Mobility  Bed Mobility Overal bed mobility: Needs Assistance Bed Mobility: Supine to Sit, Sit to Supine     Supine to sit: Min assist, +2 for safety/equipment Sit to supine: Min assist, +2 for safety/equipment   General bed mobility comments: Min A for trunk elevation to come to sitting. Min A for RLE assist for return to supine.    Transfers Overall transfer level: Needs assistance Equipment used: 2 person hand held assist, Rolling walker (2 wheels) Transfers: Sit to/from Stand Sit to Stand: Min assist, +2 safety/equipment           General transfer comment: Performed standing X2. Unable to bear weight through RLE.  Unable to take hop steps or pivotal steps towards [facility] secondary to increased pain in RLE.    Ambulation/Gait                  Stairs            Wheelchair Mobility    Modified Rankin (Stroke Patients Only)       Balance Overall balance assessment: Needs assistance Sitting-balance support: No upper extremity supported, Feet supported Sitting balance-[name]: Fair     Standing balance support: Bilateral upper extremity supported Standing balance-[name]: Poor Standing balance comment: Reliant on UE and external support                             Pertinent Vitals/Pain Pain Assessment Pain Assessment: 0-10 Pain Score: 10-Worst pain ever Pain Location: LLE Pain Descriptors / Indicators: Sharp, Throbbing Pain Intervention(s): Limited activity within patient's tolerance, Monitored during session, Repositioned    Home Living Family/patient expects to be discharged to:: Private  residence Living Arrangements: Spouse/significant other Available Help at Discharge: Family;Available PRN/intermittently Type of Home: House Home Access: Stairs to enter Entrance Stairs-Rails: None Entrance Stairs-Number of Steps: 2   Home Layout: One level Home Equipment: None       Prior Function Prior Level of Function : Independent/Modified Independent;Driving;Working/employed               ADLs Comments: Works as a Firefighter Extremity Assessment Upper Extremity Assessment: Defer to OT evaluation    Lower Extremity Assessment Lower Extremity Assessment: RLE deficits/detail;LLE deficits/detail RLE Deficits / Details: Increased swelling and redness in shin through foot. Limited ankle ROM. Increased pain in dependent position. RLE Sensation: decreased light touch LLE Deficits / Details: Multiple wounds noted on shin    Cervical / Trunk Assessment Cervical / Trunk Assessment: Normal  Communication   Communication: No difficulties  Cognition Arousal/Alertness: Awake/alert Behavior During Therapy: WFL for tasks assessed/performed Overall Cognitive Status: Within Functional Limits for tasks assessed                                          General Comments General comments (skin integrity, edema, etc.): BP in supine at 90/59, in sitting at 105/79 and after standing 96/49.    Exercises     Assessment/Plan    PT Assessment Patient needs continued PT services  PT Problem List Decreased strength;Decreased activity tolerance;Decreased range of motion;Decreased balance;Decreased mobility;Decreased knowledge of use of DME;Decreased knowledge of precautions       PT Treatment Interventions DME instruction;Gait training;Stair training;Functional mobility training;Therapeutic activities;Therapeutic exercise;Balance training;Patient/family education    PT Goals (Current goals can be found in the Care Plan section)  Acute Rehab PT Goals Patient Stated Goal: to decrease pain PT Goal Formulation: With patient Time For Goal Achievement: 11/12/21 Potential to Achieve Goals: Good    Frequency Min 3X/week     Co-evaluation PT/OT/SLP Co-Evaluation/Treatment: Yes Reason for  Co-Treatment: For patient/therapist safety;To address functional/ADL transfers PT goals addressed during session: Mobility/safety with mobility;Balance         AM-PAC PT "6 Clicks" Mobility  Outcome Measure Help needed turning from your back to your side while in a flat bed without using bedrails?: A Little Help needed moving from lying on your back to sitting on the side of a flat bed without using bedrails?: A Little Help needed moving to and from a bed to a chair (including a wheelchair)?: A Lot Help needed standing up from a chair using your arms (e.g., wheelchair or bedside chair)?: A Little Help needed to walk in hospital room?: A Lot Help needed climbing 3-5 steps with a railing? : Total 6 Click Score: 14    End of Session Equipment Utilized During Treatment: Gait belt Activity Tolerance: Patient limited by pain Patient left: in bed;with call bell/phone within reach;with family/visitor present;Other (comment) (on stretcher in ED with family present) Nurse Communication: Mobility status PT Visit Diagnosis: Other abnormalities of gait and mobility (R26.89);Difficulty in walking, not elsewhere classified (R26.2);Pain Pain - Right/Left: Right Pain - part of body: Leg    Time: 5784-6962 PT Time Calculation (min) (ACUTE ONLY): 20 min   Charges:   PT Evaluation $PT Eval Moderate Complexity: 1 Mod          Reuel Derby, PT, DPT  Acute Rehabilitation Services  Office: [phone]   Rudean Hitt 10/29/2021, 12:35 PM

## 2021-10-30 ENCOUNTER — Inpatient Hospital Stay (HOSPITAL_COMMUNITY): Payer: BC Managed Care – PPO | Admitting: Anesthesiology

## 2021-10-30 ENCOUNTER — Encounter (HOSPITAL_BASED_OUTPATIENT_CLINIC_OR_DEPARTMENT_OTHER): Payer: BC Managed Care – PPO | Admitting: General Surgery

## 2021-10-30 ENCOUNTER — Other Ambulatory Visit: Payer: Self-pay

## 2021-10-30 ENCOUNTER — Encounter (HOSPITAL_COMMUNITY): Payer: BC Managed Care – PPO

## 2021-10-30 ENCOUNTER — Encounter (HOSPITAL_COMMUNITY): Payer: Self-pay | Admitting: Internal Medicine

## 2021-10-30 ENCOUNTER — Encounter (HOSPITAL_COMMUNITY)
Admission: EM | Disposition: A | Discharge status: Home Health | Payer: Self-pay | Source: Home / Self Care | Attending: Internal Medicine

## 2021-10-30 ENCOUNTER — Ambulatory Visit (HOSPITAL_COMMUNITY): Admission: RE | Admit: 2021-10-30 | Payer: BC Managed Care – PPO | Source: Ambulatory Visit

## 2021-10-30 DIAGNOSIS — L02415 Cutaneous abscess of right lower limb: Secondary | ICD-10-CM

## 2021-10-30 DIAGNOSIS — M726 Necrotizing fasciitis: Secondary | ICD-10-CM | POA: Diagnosis not present

## 2021-10-30 DIAGNOSIS — A419 Sepsis, unspecified organism: Secondary | ICD-10-CM | POA: Diagnosis not present

## 2021-10-30 DIAGNOSIS — R6520 Severe sepsis without septic shock: Secondary | ICD-10-CM | POA: Diagnosis not present

## 2021-10-30 HISTORY — PX: I & D EXTREMITY: SHX5045

## 2021-10-30 LAB — ANA W/REFLEX IF POSITIVE: Anti Nuclear Antibody (ANA): NEGATIVE

## 2021-10-30 LAB — BASIC METABOLIC PANEL
Anion gap: 5 (ref 5–15)
BUN: 17 mg/dL (ref 6–20)
CO2: 23 mmol/L (ref 22–32)
Calcium: 8 mg/dL — ABNORMAL LOW (ref 8.9–10.3)
Chloride: 113 mmol/L — ABNORMAL HIGH (ref 98–111)
Creatinine, Ser: 1.06 mg/dL — ABNORMAL HIGH (ref 0.44–1.00)
GFR, Estimated: >60 mL/min (ref >60)
Glucose, Bld: 122 mg/dL — ABNORMAL HIGH (ref 70–99)
Potassium: 4 mmol/L (ref 3.5–5.1)
Sodium: 141 mmol/L (ref 135–145)

## 2021-10-30 LAB — MAGNESIUM: Magnesium: 1.3 mg/dL — ABNORMAL LOW (ref 1.7–2.4)

## 2021-10-30 LAB — LACTIC ACID, PLASMA
Lactic Acid, Venous: 1.4 mmol/L (ref 0.5–1.9)
Lactic Acid, Venous: 1.8 mmol/L (ref 0.5–1.9)

## 2021-10-30 LAB — MRSA NEXT GEN BY PCR, NASAL: MRSA by PCR Next Gen: NOT DETECTED

## 2021-10-30 LAB — CK: Total CK: 63 U/L (ref 38–234)

## 2021-10-30 LAB — PHOSPHORUS: Phosphorus: 3.1 mg/dL (ref 2.5–4.6)

## 2021-10-30 LAB — CORTISOL: Cortisol, Plasma: 5.3 ug/dL

## 2021-10-30 SURGERY — IRRIGATION AND DEBRIDEMENT EXTREMITY
Anesthesia: General | Laterality: Right

## 2021-10-30 MED ORDER — PROPOFOL 10 MG/ML IV BOLUS
INTRAVENOUS | Status: DC | PRN
  Administered 2021-10-30: 150 mg via INTRAVENOUS

## 2021-10-30 MED ORDER — CHLORHEXIDINE GLUCONATE 0.12 % MT SOLN
OROMUCOSAL | Status: AC
  Administered 2021-10-30: 15 mL via OROMUCOSAL
  Filled 2021-10-30: qty 15

## 2021-10-30 MED ORDER — AMISULPRIDE (ANTIEMETIC) 5 MG/2ML IV SOLN: 10.0000 mg | Freq: Once | INTRAVENOUS | Status: DC | PRN

## 2021-10-30 MED ORDER — HYDROCODONE-ACETAMINOPHEN 7.5-325 MG PO TABS
1.0000 | ORAL_TABLET | ORAL | Status: DC | PRN
  Administered 2021-10-30 – 2021-11-03 (×8): 2 via ORAL
  Filled 2021-10-30 (×8): qty 2

## 2021-10-30 MED ORDER — HYDROMORPHONE HCL 1 MG/ML IJ SOLN
INTRAMUSCULAR | Status: AC
  Administered 2021-10-30: 0.5 mg via INTRAVENOUS
  Filled 2021-10-30: qty 1

## 2021-10-30 MED ORDER — POLYETHYLENE GLYCOL 3350 17 G PO PACK: 17.0000 g | PACK | Freq: Every day | ORAL | Status: DC | PRN

## 2021-10-30 MED ORDER — EPHEDRINE 5 MG/ML INJ
INTRAVENOUS | Status: AC
  Filled 2021-10-30: qty 5

## 2021-10-30 MED ORDER — PREDNISOLONE ACETATE 1 % OP SUSP
1.0000 [drp] | Freq: Four times a day (QID) | OPHTHALMIC | Status: DC
  Administered 2021-10-30 – 2021-11-05 (×24): 1 [drp] via OPHTHALMIC
  Filled 2021-10-30: qty 5

## 2021-10-30 MED ORDER — ONDANSETRON HCL 4 MG PO TABS
4.0000 mg | ORAL_TABLET | Freq: Four times a day (QID) | ORAL | Status: DC | PRN
Start: 2021-10-30 — End: 2021-11-01

## 2021-10-30 MED ORDER — DEXAMETHASONE SODIUM PHOSPHATE 10 MG/ML IJ SOLN
INTRAMUSCULAR | Status: AC
  Filled 2021-10-30: qty 1

## 2021-10-30 MED ORDER — FENTANYL CITRATE (PF) 250 MCG/5ML IJ SOLN
INTRAMUSCULAR | Status: DC | PRN
  Administered 2021-10-30 (×3): 50 ug via INTRAVENOUS
  Administered 2021-10-30: 100 ug via INTRAVENOUS

## 2021-10-30 MED ORDER — HYDROCODONE-ACETAMINOPHEN 5-325 MG PO TABS
1.0000 | ORAL_TABLET | ORAL | Status: DC | PRN
  Administered 2021-11-01 – 2021-11-05 (×6): 2 via ORAL
  Filled 2021-10-30 (×6): qty 2

## 2021-10-30 MED ORDER — JUVEN PO PACK
1.0000 | PACK | Freq: Two times a day (BID) | ORAL | Status: DC
  Administered 2021-10-31 – 2021-11-05 (×7): 1 via ORAL
  Filled 2021-10-30 (×8): qty 1

## 2021-10-30 MED ORDER — ONDANSETRON HCL 4 MG/2ML IJ SOLN: 4.0000 mg | Freq: Four times a day (QID) | INTRAMUSCULAR | Status: DC | PRN

## 2021-10-30 MED ORDER — ACETAMINOPHEN 325 MG PO TABS: 325.0000 mg | ORAL_TABLET | Freq: Four times a day (QID) | ORAL | Status: DC | PRN

## 2021-10-30 MED ORDER — PHENYLEPHRINE 80 MCG/ML (10ML) SYRINGE FOR IV PUSH (FOR BLOOD PRESSURE SUPPORT)
PREFILLED_SYRINGE | INTRAVENOUS | Status: DC | PRN
  Administered 2021-10-30: 240 ug via INTRAVENOUS
  Administered 2021-10-30: 200 ug via INTRAVENOUS
  Administered 2021-10-30: 240 ug via INTRAVENOUS
  Administered 2021-10-30: 120 ug via INTRAVENOUS

## 2021-10-30 MED ORDER — METHOCARBAMOL 500 MG PO TABS: 500.0000 mg | ORAL_TABLET | Freq: Four times a day (QID) | ORAL | Status: DC | PRN

## 2021-10-30 MED ORDER — LACTATED RINGERS IV SOLN: INTRAVENOUS | Status: DC

## 2021-10-30 MED ORDER — HYDROMORPHONE HCL 1 MG/ML IJ SOLN
0.2500 mg | INTRAMUSCULAR | Status: DC | PRN
  Administered 2021-10-30 (×2): 0.5 mg via INTRAVENOUS

## 2021-10-30 MED ORDER — SODIUM CHLORIDE 0.9 % IV SOLN
INTRAVENOUS | Status: DC
Start: 2021-10-30 — End: 2021-11-01

## 2021-10-30 MED ORDER — POVIDONE-IODINE 10 % EX SWAB: 2.0000 | Freq: Once | CUTANEOUS | Status: DC

## 2021-10-30 MED ORDER — CEFAZOLIN SODIUM-DEXTROSE 2-3 GM-%(50ML) IV SOLR
INTRAVENOUS | Status: DC | PRN
  Administered 2021-10-30: 2 g via INTRAVENOUS

## 2021-10-30 MED ORDER — 0.9 % SODIUM CHLORIDE (POUR BTL) OPTIME
TOPICAL | Status: DC | PRN
  Administered 2021-10-30: 1000 mL

## 2021-10-30 MED ORDER — LIDOCAINE 2% (20 MG/ML) 5 ML SYRINGE
INTRAMUSCULAR | Status: AC
  Filled 2021-10-30: qty 5

## 2021-10-30 MED ORDER — BISACODYL 10 MG RE SUPP: 10.0000 mg | Freq: Every day | RECTAL | Status: DC | PRN

## 2021-10-30 MED ORDER — LIDOCAINE 2% (20 MG/ML) 5 ML SYRINGE
INTRAMUSCULAR | Status: DC | PRN
  Administered 2021-10-30: 60 mg via INTRAVENOUS

## 2021-10-30 MED ORDER — MAGNESIUM SULFATE 4 GM/100ML IV SOLN
4.0000 g | Freq: Once | INTRAVENOUS | Status: AC
  Administered 2021-10-30: 4 g via INTRAVENOUS
  Filled 2021-10-30: qty 100

## 2021-10-30 MED ORDER — LACTATED RINGERS IV BOLUS
500.0000 mL | Freq: Once | INTRAVENOUS | Status: AC
Start: 2021-10-30 — End: 2021-10-30
  Administered 2021-10-30: 500 mL via INTRAVENOUS

## 2021-10-30 MED ORDER — PROPOFOL 10 MG/ML IV BOLUS
INTRAVENOUS | Status: AC
  Filled 2021-10-30: qty 20

## 2021-10-30 MED ORDER — METOCLOPRAMIDE HCL 5 MG/ML IJ SOLN: 5.0000 mg | Freq: Three times a day (TID) | INTRAMUSCULAR | Status: DC | PRN

## 2021-10-30 MED ORDER — PHENYLEPHRINE 80 MCG/ML (10ML) SYRINGE FOR IV PUSH (FOR BLOOD PRESSURE SUPPORT)
PREFILLED_SYRINGE | INTRAVENOUS | Status: AC
  Filled 2021-10-30: qty 10

## 2021-10-30 MED ORDER — ONDANSETRON HCL 4 MG/2ML IJ SOLN
INTRAMUSCULAR | Status: DC | PRN
  Administered 2021-10-30: 4 mg via INTRAVENOUS

## 2021-10-30 MED ORDER — EPHEDRINE SULFATE-NACL 50-0.9 MG/10ML-% IV SOSY
PREFILLED_SYRINGE | INTRAVENOUS | Status: DC | PRN
  Administered 2021-10-30: 10 mg via INTRAVENOUS

## 2021-10-30 MED ORDER — CHLORHEXIDINE GLUCONATE 0.12 % MT SOLN
15.0000 mL | Freq: Once | OROMUCOSAL | Status: AC
Start: 2021-10-30 — End: 2021-10-30
  Administered 2021-10-30: 15 mL via OROMUCOSAL

## 2021-10-30 MED ORDER — FENTANYL CITRATE (PF) 250 MCG/5ML IJ SOLN
INTRAMUSCULAR | Status: AC
  Filled 2021-10-30: qty 5

## 2021-10-30 MED ORDER — DEXAMETHASONE SODIUM PHOSPHATE 10 MG/ML IJ SOLN
INTRAMUSCULAR | Status: DC | PRN
  Administered 2021-10-30: 5 mg via INTRAVENOUS

## 2021-10-30 MED ORDER — DOCUSATE SODIUM 100 MG PO CAPS
100.0000 mg | ORAL_CAPSULE | Freq: Two times a day (BID) | ORAL | Status: DC
  Administered 2021-10-30 – 2021-10-31 (×3): 100 mg via ORAL
  Filled 2021-10-30 (×3): qty 1

## 2021-10-30 MED ORDER — METHOCARBAMOL 1000 MG/10ML IJ SOLN: 500.0000 mg | Freq: Four times a day (QID) | INTRAVENOUS | Status: DC | PRN

## 2021-10-30 MED ORDER — OXYCODONE HCL 5 MG PO TABS: 5.0000 mg | ORAL_TABLET | Freq: Once | ORAL | Status: DC | PRN

## 2021-10-30 MED ORDER — HYDROMORPHONE HCL 1 MG/ML IJ SOLN
0.5000 mg | INTRAMUSCULAR | Status: DC | PRN
  Administered 2021-10-30: 0.5 mg via INTRAVENOUS
  Filled 2021-10-30: qty 1

## 2021-10-30 MED ORDER — MORPHINE SULFATE (PF) 2 MG/ML IV SOLN
0.5000 mg | INTRAVENOUS | Status: DC | PRN
  Administered 2021-10-31: 1 mg via INTRAVENOUS
  Filled 2021-10-30: qty 1

## 2021-10-30 MED ORDER — CHLORHEXIDINE GLUCONATE 4 % EX LIQD: 60.0000 mL | Freq: Once | CUTANEOUS | Status: DC

## 2021-10-30 MED ORDER — METOCLOPRAMIDE HCL 5 MG PO TABS: 5.0000 mg | ORAL_TABLET | Freq: Three times a day (TID) | ORAL | Status: DC | PRN

## 2021-10-30 MED ORDER — CEFAZOLIN SODIUM 1 G IJ SOLR
INTRAMUSCULAR | Status: AC
  Filled 2021-10-30: qty 20

## 2021-10-30 MED ORDER — MIDAZOLAM HCL 2 MG/2ML IJ SOLN
INTRAMUSCULAR | Status: AC
  Filled 2021-10-30: qty 2

## 2021-10-30 MED ORDER — ONDANSETRON HCL 4 MG/2ML IJ SOLN
INTRAMUSCULAR | Status: AC
  Filled 2021-10-30: qty 2

## 2021-10-30 MED ORDER — OXYCODONE HCL 5 MG/5ML PO SOLN: 5.0000 mg | Freq: Once | ORAL | Status: DC | PRN

## 2021-10-30 MED ORDER — ONDANSETRON HCL 4 MG/2ML IJ SOLN: 4.0000 mg | Freq: Once | INTRAMUSCULAR | Status: DC | PRN

## 2021-10-30 MED ORDER — MIDAZOLAM HCL 2 MG/2ML IJ SOLN
INTRAMUSCULAR | Status: DC | PRN
  Administered 2021-10-30: 2 mg via INTRAVENOUS

## 2021-10-30 MED ORDER — SODIUM CHLORIDE 0.9 % IV SOLN
8.0000 mg/kg | Freq: Every day | INTRAVENOUS | Status: DC
  Administered 2021-10-30 – 2021-10-31 (×2): 500 mg via INTRAVENOUS
  Filled 2021-10-30 (×4): qty 10

## 2021-10-30 SURGICAL SUPPLY — 29 items
BAG COUNTER SPONGE SURGICOUNT (BAG) IMPLANT
BLADE SURG 21 STRL SS (BLADE) ×2 IMPLANT
CANISTER WOUNDNEG PRESSURE 500 (CANNISTER) ×1 IMPLANT
COVER SURGICAL LIGHT HANDLE (MISCELLANEOUS) ×4 IMPLANT
DRAPE U-SHAPE 47X51 STRL (DRAPES) ×2 IMPLANT
DRESSING VERAFLO CLEANS CC MED (GAUZE/BANDAGES/DRESSINGS) IMPLANT
DRSG VERAFLO CLEANSE CC MED (GAUZE/BANDAGES/DRESSINGS) ×4
DURAPREP 26ML APPLICATOR (WOUND CARE) ×2 IMPLANT
ELECT REM PT RETURN 9FT ADLT (ELECTROSURGICAL)
ELECTRODE REM PT RTRN 9FT ADLT (ELECTROSURGICAL) IMPLANT
GLOVE BIOGEL PI IND STRL 9 (GLOVE) ×1 IMPLANT
GLOVE BIOGEL PI INDICATOR 9 (GLOVE) ×1
GLOVE SURG ORTHO 9.0 STRL STRW (GLOVE) ×2 IMPLANT
GOWN STRL REUS W/ TWL XL LVL3 (GOWN DISPOSABLE) ×2 IMPLANT
GOWN STRL REUS W/TWL XL LVL3 (GOWN DISPOSABLE) ×4
HANDPIECE INTERPULSE COAX TIP (DISPOSABLE)
KIT BASIN OR (CUSTOM PROCEDURE TRAY) ×2 IMPLANT
KIT TURNOVER KIT B (KITS) ×2 IMPLANT
MANIFOLD NEPTUNE II (INSTRUMENTS) ×2 IMPLANT
NS IRRIG 1000ML POUR BTL (IV SOLUTION) ×2 IMPLANT
PACK ORTHO EXTREMITY (CUSTOM PROCEDURE TRAY) ×2 IMPLANT
PAD ARMBOARD 7.5X6 YLW CONV (MISCELLANEOUS) ×4 IMPLANT
PAD NEG PRESSURE SENSATRAC (MISCELLANEOUS) ×1 IMPLANT
SET HNDPC FAN SPRY TIP SCT (DISPOSABLE) IMPLANT
STOCKINETTE IMPERVIOUS 9X36 MD (GAUZE/BANDAGES/DRESSINGS) IMPLANT
SUT ETHILON 2 0 PSLX (SUTURE) ×3 IMPLANT
TOWEL GREEN STERILE (TOWEL DISPOSABLE) ×2 IMPLANT
TUBE CONNECTING 12X1/4 (SUCTIONS) ×2 IMPLANT
YANKAUER SUCT BULB TIP NO VENT (SUCTIONS) ×2 IMPLANT

## 2021-10-30 NOTE — Progress Notes (Signed)
PROGRESS NOTE    Jessica Lynn  BPZ:[id number] DOB: Nov 30, 1960 DOA: 10/28/2021 PCP: Cassandria Anger, MD    Brief Narrative:  61 year old with history of chronic bilateral extremity ulcers, retinal vasculitis presented to the emergency room with abnormal kidney functions called by her rheumatologist.  Patient is taken care of at wound care clinic and also followed by rheumatologist.  Recently treated with Humira and subsequently with prednisone that was tapered off.  She went to rheumatologist yesterday who did a routine blood test and found to have AKI, labs unavailable with previous normal renal functions so was asked to go to emergency room.  Recently taken Augmentin.  Seen at wound care clinic few days ago. Initially in the emergency room she was noted to be hypotensive requiring 4 L of fluid, blood cultures were drawn and she was started on antibiotics.   Assessment & Plan:   Severe sepsis present on admission: Likely secondary to superadded infection, leg cellulitis and abscess.  Presented with hypotension, lactic acidosis and WBC count of 12.2.  Started on multiple antibiotics.  Now on daptomycin as per ID recommendation. Patient was found to have significant swelling on the leg, MRI consistent with large fluctuant abscess. Discussed with orthopedic surgery, taken to operating room for incision drainage and debridement.  Operative cultures will be appreciated.  Acute kidney injury: Prerenal in the setting of sepsis and hypotension.   Aggressively resuscitated.  Kidney functions normalized.   Patient was on losartan hydrochlorothiazide as well as Lasix at home. Continue maintenance IV fluids today.  Renal ultrasound essentially normal.  Intake and output monitoring.  Essential hypertension: Blood pressures low normal.  Hold all antihypertensives.  Hypotension: Probably due to #2.  She was recently on prednisone and that was tapered off.  Do not expect adrenal insufficiency with  normal cortisol levels. Blood pressures are mostly more than 100. Improving perfusion to the kidneys and no evidence of orthostatic blood pressure drop, monitor.  Hypomagnesemia: Severe.  Replace aggressively today.     DVT prophylaxis: SCDs Start: 10/28/21 2326   Code Status: Full code Family Communication: Daughter and cousin at the bedside Disposition Plan: Status is: Inpatient Remains inpatient appropriate because: Significant renal function abnormalities, leg wounds.     Consultants:  Infectious disease Wound care Orthopedics  Procedures:  None  Antimicrobials:  Daptomycin   Subjective: Patient seen and examined.  Daughter at the bedside.  Her cousin who is one of our RN at the bedside. Patient has improved appetite and thirst now.  Urine output is adequate.  Renal function normalized.  Blood pressures still reading low but MAP is adequate. Complaints of pain on any attempted mobility.  Called and discussed with orthopedics, seen in consultation and patient is going for surgery today.  Objective: Vitals:   10/30/21 0451 10/30/21 0924 10/30/21 1200 10/30/21 1216  BP: (!) 88/38 (!) 83/57 100/84   Pulse: 84 94 92   Resp: 20 18    Temp: 98.7 F (37.1 C) 99.1 F (37.3 C)    TempSrc: Oral Oral    SpO2: 100% 98%    Weight:    78.5 kg  Height:    '5\' 5"'$  (1.651 m)    Intake/Output Summary (Last 24 hours) at 10/30/2021 1433 Last data filed at 10/30/2021 0700 Gross per 24 hour  Intake 3065.48 ml  Output 1250 ml  Net 1815.48 ml   Filed Weights   10/29/21 2015 10/30/21 1216  Weight: 78.5 kg 78.5 kg    Examination:  General exam: Appears calm and comfortable at rest. Respiratory system: No added sounds. Cardiovascular system: S1 & S2 heard, RRR.  Gastrointestinal system: Soft.  Nontender.  Bowel sound present. Central nervous system: Alert and oriented. No focal neurological deficits. Extremities: Symmetric 5 x 5 power. Skin:  Multiple punched out skin  lesions clean and dry as in pictures. Tender boggy swelling on the lateral aspect of the right leg.  Picture in the chart.    Data Reviewed: I have personally reviewed following labs and imaging studies  CBC: Recent Labs  Lab 10/28/21 1628 10/28/21 1709  WBC 12.2*  --   NEUTROABS 8.5*  --   HGB 9.7* 9.9*  HCT 28.6* 29.0*  MCV 102.9*  --   PLT 193  --    Basic Metabolic Panel: Recent Labs  Lab 10/28/21 1628 10/28/21 1709 10/29/21 0032 10/30/21 0450  NA 133* 130* 133* 141  K 4.3 4.3 3.9 4.0  CL 99 98 101 113*  CO2 23  --  21* 23  GLUCOSE 115* 108* 94 122*  BUN 40* 37* 36* 17  CREATININE 2.98* 3.30* 2.14* 1.06*  CALCIUM 9.2  --  8.6* 8.0*  MG  --   --  1.7 1.3*  PHOS  --   --  3.8 3.1   GFR: Estimated Creatinine Clearance: 59.2 mL/min (A) (by C-G formula based on SCr of 1.06 mg/dL (H)). Liver Function Tests: Recent Labs  Lab 10/28/21 1814 10/29/21 0032  AST 31 24  ALT 32 24  ALKPHOS 78 63  BILITOT 0.9 0.9  PROT 6.4* 4.8*  ALBUMIN 3.1* 2.4*   No results for input(s): "LIPASE", "AMYLASE" in the last 168 hours. No results for input(s): "AMMONIA" in the last 168 hours. Coagulation Profile: Recent Labs  Lab 10/28/21 1814  INR 1.1   Cardiac Enzymes: Recent Labs  Lab 10/29/21 0032 10/30/21 0450  CKTOTAL 110 63   BNP (last 3 results) No results for input(s): "PROBNP" in the last 8760 hours. HbA1C: No results for input(s): "HGBA1C" in the last 72 hours. CBG: No results for input(s): "GLUCAP" in the last 168 hours. Lipid Profile: No results for input(s): "CHOL", "HDL", "LDLCALC", "TRIG", "CHOLHDL", "LDLDIRECT" in the last 72 hours. Thyroid Function Tests: No results for input(s): "TSH", "T4TOTAL", "FREET4", "T3FREE", "THYROIDAB" in the last 72 hours. Anemia Panel: Recent Labs    10/29/21 0032  VITAMINB12 303  FOLATE >40.0  FERRITIN 520*  TIBC 263  IRON 38  RETICCTPCT 0.9   Sepsis Labs: Recent Labs  Lab 10/29/21 0032 10/29/21 0414  10/29/21 2300 10/30/21 0045  PROCALCITON <0.10  --   --   --   LATICACIDVEN 1.3 1.0 1.4 1.8    Recent Results (from the past 240 hour(s))  Blood Culture (routine x 2)     Status: None (Preliminary result)   Collection Time: 10/28/21  6:45 PM   Specimen: Right Antecubital; Blood  Result Value Ref Range Status   Specimen Description RIGHT ANTECUBITAL  Final   Special Requests   Final    BOTTLES DRAWN AEROBIC AND ANAEROBIC Blood Culture adequate volume   Culture   Final    NO GROWTH 2 DAYS Performed at [facility], [place] [address], [place], [place] [address]    Report Status PENDING  Incomplete  MRSA Next Gen by PCR, Nasal     Status: None   Collection Time: 10/28/21 11:11 PM   Specimen: Nasal Mucosa; Nasal Swab  Result Value Ref Range Status   MRSA  by PCR Next Gen NOT DETECTED NOT DETECTED Final    Comment: (NOTE) The GeneXpert MRSA Assay (FDA approved for NASAL specimens only), is one component of a comprehensive MRSA colonization surveillance program. It is not intended to diagnose MRSA infection nor to guide or monitor treatment for MRSA infections. Test performance is not FDA approved in patients less than 83 years old. Performed at [facility], [place] [address] [place], [place] [address]          Radiology Studies: MR TIBIA FIBULA RIGHT WO CONTRAST  Result Date: 10/29/2021 CLINICAL DATA:  Right lateral leg pain and swelling concerning for abscess/myositis. Immunocompromised patient with sepsis. EXAM: MRI OF LOWER RIGHT EXTREMITY WITHOUT CONTRAST TECHNIQUE: Multiplanar, multisequence MR imaging of the right lower leg was performed. No intravenous contrast was administered. COMPARISON:  Radiographs 10/28/2021.  Ultrasound 10/24/2021. FINDINGS: Bones/Joint/Cartilage Both lower legs are included on the coronal images. There is no evidence of acute fracture or dislocation. Tricompartmental degenerative changes are present in both knees. No evidence of bone marrow  edema or bone destruction. No significant right knee or ankle joint effusion identified. Ligaments Not relevant for exam/indication. Muscles and Tendons There is mild muscular T2 hyperintensity throughout the right lower leg anterior and deep posterior compartments. No focal atrophy on the T1 weighted images. No evidence of focal fluid collection. The patellar tendon appears intact. The visualized ankle tendons are intact. Possible mild peroneal tenosynovitis. Soft tissues As seen on recent ultrasound, there is a large subcutaneous fluid collection laterally in right lower leg. This measures approximately 8.3 x 3.4 x 1.7 cm (8.4 x 4.5 x 1.3 cm on ultrasound). This collection demonstrates intermediate T1 and high T2 signal. No other focal fluid collections are identified. There is edema throughout the subcutaneous fat of the right lower leg which is asymmetric relative to the left lower leg. IMPRESSION: 1. Asymmetric edema throughout the subcutaneous tissues of the right lower leg which may reflect soft tissue infection (cellulitis). 2. Unchanged focal subcutaneous fluid collection laterally in the mid right lower leg which measures up to 8.3 cm, similar to recent ultrasound. This has a nonspecific appearance and could reflect a hematoma or subcutaneous abscess. Consider aspiration. 3. Nonspecific mild T2 hyperintensity throughout the muscles of the anterior and deep posterior compartments, which could reflect subacute denervation or myositis. No focal intramuscular fluid collection or tendon tear identified. Possible mild peroneal tenosynovitis. 4. No evidence of osteomyelitis or septic arthritis in the right lower leg. Electronically Signed   By: Richardean Sale M.D.   On: 10/29/2021 18:56   DG Tibia/Fibula Right  Result Date: 10/28/2021 CLINICAL DATA:  Swelling of the right lower extremity EXAM: RIGHT TIBIA AND FIBULA - 2 VIEW COMPARISON:  None Available. FINDINGS: No acute displaced fracture or malalignment.  Generalized edema within the subcutaneous soft tissues. Wound or ulcer along the medial aspect of the lower leg and slightly above the ankle. No soft tissue emphysema IMPRESSION: 1. No acute osseous abnormality 2. Marked soft tissue edema with suspected wound or ulcer along the medial aspect of the lower leg Electronically Signed   By: Donavan Foil M.D.   On: 10/28/2021 23:54   US RENAL  Result Date: 10/28/2021 CLINICAL DATA:  Acute kidney injury EXAM: RENAL / URINARY TRACT ULTRASOUND COMPLETE COMPARISON:  None Available. FINDINGS: Right Kidney: Renal measurements: 10.3 x 4.9 x 5 cm = volume: 131 mL. Echogenicity within normal limits. No mass or hydronephrosis visualized. Left Kidney: Renal measurements: 9.4 x 5.6 x 5.4  cm = volume: 148 mL. Small cysts demonstrated along the midpole and medial midpole of the left kidney. Largest measures 1.5 cm in diameter. Appearance is consistent with benign cyst. No additional imaging follow-up is indicated. Echogenicity within normal limits. No solid mass or hydronephrosis visualized. Bladder: Appears normal for degree of bladder distention. Other: None. IMPRESSION: Benign-appearing left renal cysts. No hydronephrosis in the kidneys. Electronically Signed   By: Lucienne Capers M.D.   On: 10/28/2021 23:53   DG Chest Port 1 View  Result Date: 10/28/2021 CLINICAL DATA:  Abnormal laboratory values EXAM: PORTABLE CHEST 1 VIEW COMPARISON:  08/18/2016 FINDINGS: The heart size and mediastinal contours are within normal limits. Both lungs are clear. The visualized skeletal structures are unremarkable. IMPRESSION: No active disease. Electronically Signed   By: Donavan Foil M.D.   On: 10/28/2021 18:40        Scheduled Meds:  chlorhexidine  60 mL Topical Once   [MAR Hold] collagenase   Topical Daily   [MAR Hold] dorzolamide-timolol  1 drop Both Eyes BID   [MAR Hold] montelukast  10 mg Oral QHS   povidone-iodine  2 Application Topical Once   [MAR Hold] prednisoLONE  acetate  1 drop Both Eyes QID   Continuous Infusions:  [MAR Hold] DAPTOmycin (CUBICIN) 500 mg in sodium chloride 0.9 % IVPB     lactated ringers       LOS: 2 days    Time spent: 35 minutes    Barb Merino, MD [facility] Pager [phone]

## 2021-10-30 NOTE — Anesthesia Postprocedure Evaluation (Signed)
Anesthesia Post Note  Patient: Jessica Lynn  Procedure(s) Performed: IRRIGATION AND DEBRIDEMENT OF LEG (Right)     Patient location during evaluation: PACU Anesthesia Type: General Level of consciousness: awake and alert Pain management: pain level controlled Vital Signs Assessment: post-procedure vital signs reviewed and stable Respiratory status: spontaneous breathing, nonlabored ventilation and respiratory function stable Cardiovascular status: blood pressure returned to baseline and stable Postop Assessment: no apparent nausea or vomiting Anesthetic complications: no   No notable events documented.  Last Vitals:  Vitals:   10/30/21 1615 10/30/21 1630  BP: (!) 123/96 119/75  Pulse: 94 96  Resp: (!) 21 19  Temp:  37.2 C  SpO2: 96% 90%    Last Pain:  Vitals:   10/30/21 1630  TempSrc:   PainSc: 10-Worst pain ever                 Lidia Collum

## 2021-10-30 NOTE — Progress Notes (Addendum)
Yellow ring given to Allice, patient's daughter, prior to patient going to surgery.

## 2021-10-30 NOTE — Progress Notes (Signed)
PT Cancellation Note  Patient Details Name: Jessica Lynn MRN: [id number] DOB: October 13, 1960   Cancelled Treatment:    Reason Eval/Treat Not Completed: (P) Patient at procedure or test/unavailable PT will follow back tomorrow.  Donaldo Teegarden B. Migdalia Dk PT, DPT Acute Rehabilitation Services Please use secure chat or  Call Office [phone]    [place] 10/30/2021, 3:12 PM

## 2021-10-30 NOTE — Progress Notes (Signed)
Vanc protocol ordered post op but pt has been on daptomycin per ID. FYI messaged Dr. Sharol Given about dc vanc protocol.  Onnie Boer, PharmD, BCIDP, AAHIVP, CPP Infectious Disease Pharmacist 10/30/2021 8:23 PM

## 2021-10-30 NOTE — Progress Notes (Signed)
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 Since she had gone off prednisone her jittery/tremors had resolved but hasn't been feeling like eating/drinking much   She was evaluated by rheum a day prior to presentation and advised to come to ed for AKI. Incidentally she has been complaining of right calf severe pain/swelling for a week. Her blood pressure has been low here and she has a mild leukocytosis on presentation. No fever documented   She has chronic bilateral LE ulcers that are improving even off prednisone. She recently in June developed some kind of hives like reaction to ?humira and some of the lesions ulcerated in the bilateral LE but again improved     #sepsis #right calf swelling concerned for staphylococcal abscess/myositis Mri confirm large fluid collection - exam today with maturing/developing pustule vs blister on top of the fluid collection. Concerning for abscess which probably would benefit from I&D Admission bcx ngtd  Sepsis physiology improving. She appeared to have been significantly dehydrated. Again query if AI playing a role. Primary team will assess     #chronic bilateral leg ulcers Improving.  She saw rheumatology 7/24 for evaluation so will need to f/u.  With reported improvement and off immunosuppression and no abx prior. Doesn't appear to be vasculitis or infectious.    #aki Significant improvement to almost baseline within 2 days supportive measure suggest prerenal etiology.       Plan: Continue daptomycin Awaiting ortho's input regarding potential I&D of the right leg fluid collection  Her ophthalmologist is awared of her tapering off immunosuppresion -- she'll need to f/u ophthalmology after discharge Will also need rheum f/u after discharge Discussed with primary team  I spent more than 35 minute reviewing data/chart, and coordinating care and >50% direct face to face time providing counseling/discussing diagnostics/treatment plan with patient   Principal Problem:   Severe sepsis ([place]) Active Problems:   Essential hypertension   Dyslipidemia   Uveitis of both eyes   Cellulitis   Hyponatremia   AKI (acute kidney injury) ([place])   Anemia   Acute kidney injury (AKI) with acute tubular necrosis (ATN) ([facility])   Allergies  Allergen Reactions   Crestor [Rosuvastatin] Other (See Comments)    cramps   Oxycodone Itching    Scheduled Meds:  collagenase   Topical Daily   dorzolamide-timolol  1 drop Both Eyes BID   montelukast  10 mg Oral QHS   prednisoLONE acetate  1 drop Both Eyes QID   Continuous Infusions:  DAPTOmycin (CUBICIN) 500 mg in sodium chloride 0.9 % IVPB     magnesium sulfate bolus IVPB 4 g (10/30/21 0923)   PRN Meds:.acetaminophen **OR** acetaminophen, HYDROcodone-acetaminophen   SUBJECTIVE: Feels subjectively better overall Bp still low but improving Renal function almost normalized Afebrile Admitting bcx negative  Dr Sharol Given to evaluate  Review of Systems: ROS All other ROS was negative, except mentioned above     OBJECTIVE: Vitals:   10/29/21 2122 10/30/21 0052 10/30/21 0451 10/30/21 0924  BP: (!) 82/58 (!) 94/52 (!) 88/38 (!) 83/57  Pulse: 89  84 94  Resp: '19  20 18  '$ Temp: 98.6 F (37 C)  98.7 F (37.1 C) 99.1 F (37.3 C)  TempSrc: Oral  Oral Oral  SpO2: 100%  100% 98%  Weight:      Height:       Body mass index is 28.79 kg/m.  Physical Exam General/constitutional: no distress, pleasant HEENT: Normocephalic,  PER, Conj Clear, EOMI, Oropharynx clear Neck supple CV: rrr no mrg Lungs: clear to auscultation, normal respiratory effort Abd: Soft, Nontender Ext/skin; focal swelling/warmth/tenderness lateroposterior right calf; several clean based healing ulcers bilateral anteromedial aspects of lower extremities that are nontender. There is a developing/maturing blister vs large pustule now on top of the swelling Neuro: nonfocal MSK: no peripheral joint swelling/tenderness/warmth; back spines nontender     Lab Results Lab Results  Component Value Date   WBC 12.2 (H) 10/28/2021   HGB 9.9 (L) 10/28/2021   HCT 29.0 (L) 10/28/2021   MCV 102.9 (H) 10/28/2021   PLT 193 10/28/2021    Lab Results  Component Value Date   CREATININE 1.06 (H) 10/30/2021   BUN 17 10/30/2021   NA 141 10/30/2021   K 4.0 10/30/2021   CL 113 (H) 10/30/2021   CO2 23 10/30/2021    Lab Results  Component Value Date   ALT 24 10/29/2021   AST 24 10/29/2021   ALKPHOS 63 10/29/2021   BILITOT 0.9 10/29/2021      Microbiology: Recent Results (from the past 240 hour(s))  Blood Culture (routine x 2)     Status: None (Preliminary result)   Collection Time: 10/28/21  6:45 PM   Specimen: Right Antecubital; Blood  Result Value Ref Range Status   Specimen Description RIGHT ANTECUBITAL  Final   Special Requests   Final    BOTTLES DRAWN AEROBIC AND ANAEROBIC Blood Culture adequate volume   Culture   Final    NO GROWTH 2 DAYS Performed at [facility], [address] [place], [place] [address]    Report Status PENDING  Incomplete  MRSA Next Gen by PCR, Nasal     Status: None   Collection Time: 10/28/21 11:11 PM   Specimen: Nasal Mucosa; Nasal Swab  Result Value Ref Range Status   MRSA by PCR Next Gen NOT DETECTED NOT DETECTED Final    Comment: (NOTE) The GeneXpert MRSA Assay (FDA approved for NASAL specimens only), is one component of a comprehensive MRSA colonization surveillance program. It is not intended to  diagnose MRSA infection nor to guide or monitor treatment for MRSA infections. Test performance is not FDA approved in patients less than 67 years old. Performed at [facility], [place] [address], [place],  [address]      Serology:   Imaging: If present, new imagings (plain films, ct scans, and mri) have been personally visualized and interpreted; radiology reports have been reviewed. Decision making incorporated into the Impression / Recommendations.   7/25 mri right LE Soft tissues   As seen on recent ultrasound, there is a large subcutaneous fluid collection laterally in right lower leg. This measures approximately 8.3 x 3.4 x 1.7 cm (8.4 x 4.5 x 1.3 cm on ultrasound). This collection demonstrates intermediate T1 and high  T2 signal. No other focal fluid collections are identified. There is edema throughout the subcutaneous fat of the right lower leg which is asymmetric relative to the left lower leg.   IMPRESSION: 1. Asymmetric edema throughout the subcutaneous tissues of the right lower leg which may reflect soft tissue infection (cellulitis). 2. Unchanged focal subcutaneous fluid collection laterally in the mid right lower leg which measures up to 8.3 cm, similar to recent ultrasound. This has a nonspecific appearance and could reflect a hematoma or subcutaneous abscess. Consider aspiration. 3. Nonspecific mild T2 hyperintensity throughout the muscles of the anterior and deep posterior compartments, which could reflect subacute denervation or myositis. No focal intramuscular fluid collection or tendon tear identified. Possible mild peroneal tenosynovitis. 4. No evidence of osteomyelitis or septic arthritis in the right lower leg.   Jabier Mutton, [facility] [place] [phone] pager    10/30/2021, 10:50 AM

## 2021-10-30 NOTE — Anesthesia Procedure Notes (Signed)
Procedure Name: LMA Insertion Date/Time: 10/30/2021 2:59 PM  Performed by: Barrington Ellison, CRNAPre-anesthesia Checklist: Patient identified, Emergency Drugs available, Suction available and Patient being monitored Patient Re-evaluated:Patient Re-evaluated prior to induction Oxygen Delivery Method: Circle System Utilized Preoxygenation: Pre-oxygenation with 100% oxygen Induction Type: IV induction Ventilation: Mask ventilation without difficulty LMA: LMA inserted LMA Size: 4.0 Number of attempts: 1 Placement Confirmation: positive ETCO2 Tube secured with: Tape Dental Injury: Teeth and Oropharynx as per pre-operative assessment

## 2021-10-30 NOTE — Anesthesia Preprocedure Evaluation (Signed)
Anesthesia Evaluation  Patient identified by MRN, date of birth, ID band Patient awake    Reviewed: Allergy & Precautions, NPO status , Patient's Chart, lab work & pertinent test results  History of Anesthesia Complications Negative for: history of anesthetic complications  Airway Mallampati: II  TM Distance: >3 FB Neck ROM: Full    Dental  (+) Edentulous Upper, Dental Advisory Given   Pulmonary neg pulmonary ROS, former smoker,    Pulmonary exam normal        Cardiovascular hypertension, Normal cardiovascular exam     Neuro/Psych negative neurological ROS     GI/Hepatic Neg liver ROS, GERD  ,  Endo/Other  negative endocrine ROS  Renal/GU negative Renal ROS  negative genitourinary   Musculoskeletal negative musculoskeletal ROS (+)   Abdominal   Peds  Hematology  (+) Blood dyscrasia, anemia ,   Anesthesia Other Findings  Necrotizing fasciitis right leg  Reproductive/Obstetrics                            Anesthesia Physical Anesthesia Plan  ASA: 3  Anesthesia Plan: General   Post-op Pain Management: Tylenol PO (pre-op)*   Induction: Intravenous  PONV Risk Score and Plan: 3 and Ondansetron, Dexamethasone, Midazolam and Treatment may vary due to age or medical condition  Airway Management Planned: LMA  Additional Equipment: None  Intra-op Plan:   Post-operative Plan: Extubation in OR  Informed Consent: I have reviewed the patients History and Physical, chart, labs and discussed the procedure including the risks, benefits and alternatives for the proposed anesthesia with the patient or authorized representative who has indicated his/her understanding and acceptance.     Dental advisory given  Plan Discussed with:   Anesthesia Plan Comments:         Anesthesia Quick Evaluation

## 2021-10-30 NOTE — Op Note (Signed)
10/30/2021  4:22 PM  PATIENT:  Jessica Lynn    PRE-OPERATIVE DIAGNOSIS:  necrotizing fasciitis  POST-OPERATIVE DIAGNOSIS:  Same  PROCEDURE:   Excisional DEBRIDEMENT OF LEG Local tissue rearrangement for wound closure 30 x 10 cm.  Placement of cleanse choice wound VAC sponges x2. Tissue culture sent x2.   SURGEON:  [name], MD  PHYSICIAN ASSISTANT:None ANESTHESIA:   General  PREOPERATIVE INDICATIONS:  Jessica Lynn is a  61 y.o. female with a diagnosis of necrotizing fasciitis who failed conservative measures and elected for surgical management.    The risks benefits and alternatives were discussed with the patient preoperatively including but not limited to the risks of infection, bleeding, nerve injury, cardiopulmonary complications, the need for revision surgery, among others, and the patient was willing to proceed.  OPERATIVE IMPLANTS: Cleanse choice wound VAC sponges x2  '@ENCIMAGES'$ @  OPERATIVE FINDINGS: Patient had gross purulence and necrotic fascia as well as necrotic muscle.  This was sent for cultures a proximal and distal culture.  OPERATIVE PROCEDURE: Patient was brought the operating room underwent a general anesthetic.  After adequate levels anesthesia were obtained patient's right lower extremity was prepped using DuraPrep draped into a sterile field a timeout was called.  A longitudinal incision was made over the entire anterior compartment there was gross purulence and necrotic fascia.  The fascia was excised there was also necrotic muscle that was also excised.  The superficial peroneal nerve was identified and protected.  There was necrotic tissue in the subcu which was also debrided.  The wound was irrigated with normal saline to cleanse choice wound VAC sponges were placed and local tissue rearrangement was used to close the skin over the wound VAC sponges for closure of 30 x 10 cm.  This had a good suction fit patient was extubated taken the PACU in stable  condition.  Debridement type: Excisional Debridement  Side: right  Body Location: lower leg   Tools used for debridement: scalpel and rongeur  Pre-debridement Wound size (cm):   Length: 0        Width: 0     Depth: 0   Post-debridement Wound size (cm):   Length: 30        Width: 10     Depth: 5   Debridement depth beyond dead/damaged tissue down to healthy viable tissue: no  Tissue layer involved: skin, subcutaneous tissue, muscle / fascia  Nature of tissue removed: Slough, Necrotic, Devitalized Tissue, Non-viable tissue, and Purulence  Irrigation volume: 3 liters     Irrigation fluid type: Normal Saline     DISCHARGE PLANNING:  Antibiotic duration: Orders written for vancomycin adjust according to tissue cultures  Weightbearing: Weightbearing as tolerated  Pain medication: Opioid pathway  Dressing care/ Wound VAC: Continue wound VAC  Ambulatory devices: [place] for return to the operating room on Friday for repeat debridement.  Follow-up: In the office 1 week post operative.

## 2021-10-30 NOTE — Progress Notes (Signed)
After several boluses, pt BP remains low side of normal this shift. Last BP 88/38, Pt asymptomatic, urinary output 500 ml this shift, bladder scan after last void 27, MD aware, 500 ml bolus ordered at this time. Pt resting comfortably in supine position w/ BLE elevated. Continuous IVF infusing at 125/hr. Will monitor closely

## 2021-10-30 NOTE — Transfer of Care (Signed)
Immediate Anesthesia Transfer of Care Note  Patient: Jessica Lynn  Procedure(s) Performed: IRRIGATION AND DEBRIDEMENT OF LEG (Right)  Patient Location: PACU  Anesthesia Type:General  Level of Consciousness: awake and oriented  Airway & Oxygen Therapy: Patient Spontanous Breathing  Post-op Assessment: Report given to RN  Post vital signs: Reviewed and stable  Last Vitals:  Vitals Value Taken Time  BP 145/88 10/30/21 1545  Temp    Pulse 103 10/30/21 1546  Resp 17 10/30/21 1546  SpO2 99 % 10/30/21 1546  Vitals shown include unvalidated device data.  Last Pain:  Vitals:   10/30/21 1200  TempSrc:   PainSc: 0-No pain      Patients Stated Pain Goal: 0 (90/38/33 3832)  Complications: No notable events documented.

## 2021-10-30 NOTE — Progress Notes (Signed)
Initial Nutrition Assessment  DOCUMENTATION CODES:   Severe malnutrition in context of chronic illness  INTERVENTION:   If pt unable to improve po intake, recommend Cortrak tube placement with initiation of supplemental TF. Nutrition concerns expressed to Dr. Sharol Given.   Liberalize diet to REGULAR.  Pt with no hx of DM; if blood sugar control a concern during admission, recommend scheduling ss insulin with q 4 hours CBG  Continue Juven BID, each packet provides 80 calories, 8 grams of carbohydrate, 2.5  grams of protein (collagen), 7 grams of L-arginine and 7 grams of L-glutamine; supplement contains CaHMB, Vitamins C, E, B12 and Zinc to promote wound healing. Pt encouraged to drink entire contents of each package.   Add 30 ml ProSource Plus TID, each supplement provides 100 kcals and 15 grams protein.   Add MVI with Minerals daily  Add Vitamin C 500 mg BID, Zinc Sulfate 220 mg daily x 14 days  Plan to check CRP, Vitamin A, Vitamin C and Zinc levels to ensure we are providing adequate supplementation for healing.  Recommend scheduled bowel regimen if continues without BM   NUTRITION DIAGNOSIS:   Severe Malnutrition related to chronic illness as evidenced by percent weight loss, moderate fat depletion, moderate muscle depletion, energy intake < or equal to 75% for > or equal to 1 month.  GOAL:   Patient will meet greater than or equal to 90% of their needs  MONITOR:   PO intake, Supplement acceptance, Labs, Weight trends, Skin  REASON FOR ASSESSMENT:   Consult Assessment of nutrition requirement/status  ASSESSMENT:   61 yo female admitted with severe sepsis secondary to necrotizing fascitis, AKI, acute on chronic anemia. PMH includes GERD, mild HTN, hyercholesterolemia.  7/26 OR for excisional debridement of leg; loacal tissue rearrangement for wound closure 30 x 10 cm, placement of wound VAC  Noted plan to return to OR tomorrow for repeat debridement  Pt alert, lunch at  bedside. Husband present. Pt reports pain is around 5-6/10  Pt has not been eating well for sometime. Pt reports poor appetite, altered taste (foods either have no taste or taste like trash). Pt reports she might eat 1 meal per day but on some days she is not eating anything at all. Husband reports he never sees her eat. Pt does report drinking plenty of water every day.   On visit today, pt ate 1/2 [place] and cheese sandwich. Pt with open packet of Juven but packet appeared full. Pt reports she put a small amount in water and drank it and plans to complete the entire packet. RD discussed the reason for Juven supplementation and that pt needs to drink 2 entire packets per day. Pt verbalized understanding.   Pt does not like Ensure/Boost supplements; pt reports she cannot do so.   Per weight encounters, pt with significant weight loss over the past year. Noted weight around 105 kg August of 2022. Current wt 78.5 kg. 25% wt loss in 1 year. Pt reports UBW around 245-250 pounds (111 kg) and reports weight dropped down to 160 pounds since March. 35% wt loss based on report.   Pt reports she has been weak. Noted plan for Rehab at discharge.   Noted hx of Vit D Deficiency, no recent lab  Labs: magnesium 1.3 (L), BUN wdl, Creatinine 1.06, no CBGs Meds: colace, mag sulfate, reglan prn, dulcolax prn  NUTRITION - FOCUSED PHYSICAL EXAM:  Flowsheet Row Most Recent Value  Orbital Region Moderate depletion  Upper Arm Region No depletion  Thoracic and Lumbar Region No depletion  Buccal Region Moderate depletion  Temple Region Moderate depletion  Clavicle Bone Region Moderate depletion  Clavicle and Acromion Bone Region Moderate depletion  Scapular Bone Region Moderate depletion  Dorsal Hand Mild depletion  Patellar Region Unable to assess  Anterior Thigh Region Unable to assess  Posterior Calf Region Unable to assess       Diet Order:  CARB MODIFIED   EDUCATION NEEDS:   Education needs have  been addressed  Skin:  Skin Assessment: Skin Integrity Issues: Skin Integrity Issues:: Wound VAC Wound Vac: LLE necrotizing fascitits, 30 x 10 x 5 cm  Last BM:  7/23  Height:   Ht Readings from Last 1 Encounters:  10/30/21 '5\' 5"'$  (1.651 m)    Weight:   Wt Readings from Last 1 Encounters:  10/30/21 78.5 kg    BMI:  Body mass index is 28.79 kg/m.  Estimated Nutritional Needs:   Kcal:  1800-2000 kcals  Protein:  90-115 g  Fluid:  >/= 1.8 L   Kerman Passey MS, RDN, LDN, CNSC Registered Dietitian 3 Clinical Nutrition RD Pager and On-Call Pager Number Located in [place]
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
## 2021-10-30 NOTE — Consult Note (Signed)
ORTHOPAEDIC CONSULTATION  REQUESTING PHYSICIAN: Barb Merino, MD  Chief Complaint: Abscess pain and ulceration primarily right leg  HPI: Jessica Lynn is a 61 y.o. female who presents with acute ulceration right leg with fluctuance and swelling.  Past Medical History:  Diagnosis Date   Allergic rhinitis    year around    Allergy    Atypical chest pain    GERD (gastroesophageal reflux disease)    Hypercholesterolemia    Mild hypertension    Obesity    Postherpetic neuralgia    from shingles   Shingles    Snoring    Somatic dysfunction    Past Surgical History:  Procedure Laterality Date   DILATION AND CURETTAGE OF UTERUS     x2 w/ miscarriages   TUBAL LIGATION     VAGINAL DELIVERY     x2   Social History   Socioeconomic History   Marital status: Married    Spouse name: Training and development officer   Number of children: Not on file   Years of education: Not on file   Highest education level: Not on file  Occupational History   Occupation: Financial planner: [employer]  Tobacco Use   Smoking status: Former    Packs/day: 0.20    Years: 4.00    Total pack years: 0.80    Types: Cigarettes    Quit date: 04/07/2006    Years since quitting: 15.5   Smokeless tobacco: Never  Vaping Use   Vaping Use: Never used  Substance and Sexual Activity   Alcohol use: No    Alcohol/week: 0.0 standard drinks of alcohol    Comment: social use   Drug use: No   Sexual activity: Yes    Birth control/protection: None  Other Topics Concern   Not on file  Social History Narrative   Not on file   Social Determinants of Health   Financial Resource Strain: Not on file  Food Insecurity: Not on file  Transportation Needs: Not on file  Physical Activity: Not on file  Stress: Not on file  Social Connections: Not on file   Family History  Problem Relation Age of Onset   Colon cancer Brother 79   Colon polyps Mother    Colon polyps Father    Colon cancer Maternal  Uncle    Esophageal cancer Neg Hx    Rectal cancer Neg Hx    Stomach cancer Neg Hx    - negative except otherwise stated in the family history section Allergies  Allergen Reactions   Crestor [Rosuvastatin] Other (See Comments)    cramps   Oxycodone Itching   Prior to Admission medications   Medication Sig Start Date End Date Taking? Authorizing Provider  Cholecalciferol (VITAMIN D3) 2000 units capsule Take 1 capsule (2,000 Units total) by mouth daily. 12/15/16  Yes Plotnikov, Evie Lacks, MD  dorzolamide-timolol (COSOPT) 22.3-6.8 MG/ML ophthalmic solution Place 1 drop into the right eye 2 (two) times daily. 11/20/20  Yes [provider]  folic acid (FOLVITE) 1 MG tablet TAKE 1 TABLET(1 MG) BY MOUTH DAILY Patient taking differently: Take 1 mg by mouth daily. 05/20/21  Yes Plotnikov, Evie Lacks, MD  HYDROcodone-acetaminophen (NORCO/VICODIN) 5-325 MG tablet Take 1 tablet by mouth 3 (three) times daily as needed for severe pain. Patient taking differently: Take 1 tablet by mouth 2 (two) times daily as needed for severe pain. 10/17/21 10/17/22 Yes Plotnikov, Evie Lacks, MD  hydrOXYzine (ATARAX/VISTARIL) 25 MG tablet TAKE 1  TABLET BY MOUTH EVERY 8 HOURS AS NEEDED FOR ITCHING Patient taking differently: Take 25 mg by mouth daily as needed for itching. 11/30/20  Yes Plotnikov, Evie Lacks, MD  montelukast (SINGULAIR) 10 MG tablet TAKE 1 TABLET(10 MG) BY MOUTH DAILY Patient taking differently: Take 10 mg by mouth daily in the afternoon. 12/24/20  Yes Plotnikov, Evie Lacks, MD  naproxen (NAPROSYN) 500 MG tablet Take 500 mg by mouth daily as needed (swelling in knee). 09/22/21  Yes [provider]  prednisoLONE acetate (PRED FORTE) 1 % ophthalmic suspension Place 1 drop into both eyes 4 (four) times daily. 01/20/21  Yes [provider]  SANTYL 250 UNIT/GM ointment Apply 1 Application topically daily. 10/16/21  Yes [provider]  triamcinolone cream (KENALOG) 0.1 % Apply 1  application. topically 4 (four) times daily. Patient taking differently: Apply 1 application  topically daily. 08/27/21  Yes Plotnikov, Evie Lacks, MD  valsartan-hydrochlorothiazide (DIOVAN-HCT) 160-12.5 MG tablet TAKE 1 TABLET BY MOUTH DAILY 10/17/21  Yes Plotnikov, Evie Lacks, MD  zolpidem (AMBIEN) 10 MG tablet Take 1 tablet (10 mg total) by mouth at bedtime as needed for sleep. Patient taking differently: Take 10 mg by mouth at bedtime. 09/26/21  Yes Plotnikov, Evie Lacks, MD  Adalimumab (HUMIRA) 40 MG/0.4ML PSKT Inject into the skin. Patient not taking: Reported on 10/29/2021    [provider]  amoxicillin-clavulanate (AUGMENTIN) 875-125 MG tablet Take 1 tablet by mouth 2 (two) times daily. Patient not taking: Reported on 10/29/2021 10/16/21   [provider]  brimonidine-timolol (COMBIGAN) 0.2-0.5 % ophthalmic solution Place 1 drop into both eyes 2 times daily. Patient not taking: Reported on 10/17/2021 01/11/21   [provider]  clonazePAM (KLONOPIN) 1 MG tablet Take 1/2 to 1  Tablet by mouth two times daily as needed for nerves Patient not taking: Reported on 10/17/2021 12/16/19   Plotnikov, Evie Lacks, MD  furosemide (LASIX) 40 MG tablet Take 1 tablet (40 mg total) by mouth daily. Patient not taking: Reported on 10/29/2021 09/16/21 09/16/22  Plotnikov, Evie Lacks, MD  KLOR-CON M20 20 MEQ tablet TAKE 1 TABLET BY MOUTH EVERY DAY Patient not taking: Reported on 10/29/2021 10/15/21   Plotnikov, Evie Lacks, MD  pantoprazole (PROTONIX) 40 MG tablet Take 1 tablet (40 mg total) by mouth daily. Patient not taking: Reported on 10/17/2021 09/16/21   Plotnikov, Evie Lacks, MD  pravastatin (PRAVACHOL) 20 MG tablet TAKE 1 TABLET(20 MG) BY MOUTH DAILY Patient not taking: Reported on 10/17/2021 04/02/21   Plotnikov, Evie Lacks, MD  predniSONE (DELTASONE) 10 MG tablet Take 20 mg with breakfast and 10 mg with lunch x 1 week, then 20 mg q am x 1 week, then 10 mg q am pc Patient not taking: Reported on  10/29/2021 10/17/21   Plotnikov, Evie Lacks, MD   MR TIBIA FIBULA RIGHT WO CONTRAST  Result Date: 10/29/2021 CLINICAL DATA:  Right lateral leg pain and swelling concerning for abscess/myositis. Immunocompromised patient with sepsis. EXAM: MRI OF LOWER RIGHT EXTREMITY WITHOUT CONTRAST TECHNIQUE: Multiplanar, multisequence MR imaging of the right lower leg was performed. No intravenous contrast was administered. COMPARISON:  Radiographs 10/28/2021.  Ultrasound 10/24/2021. FINDINGS: Bones/Joint/Cartilage Both lower legs are included on the coronal images. There is no evidence of acute fracture or dislocation. Tricompartmental degenerative changes are present in both knees. No evidence of bone marrow edema or bone destruction. No significant right knee or ankle joint effusion identified. Ligaments Not relevant for exam/indication. Muscles and Tendons There is mild muscular T2 hyperintensity throughout  the right lower leg anterior and deep posterior compartments. No focal atrophy on the T1 weighted images. No evidence of focal fluid collection. The patellar tendon appears intact. The visualized ankle tendons are intact. Possible mild peroneal tenosynovitis. Soft tissues As seen on recent ultrasound, there is a large subcutaneous fluid collection laterally in right lower leg. This measures approximately 8.3 x 3.4 x 1.7 cm (8.4 x 4.5 x 1.3 cm on ultrasound). This collection demonstrates intermediate T1 and high T2 signal. No other focal fluid collections are identified. There is edema throughout the subcutaneous fat of the right lower leg which is asymmetric relative to the left lower leg. IMPRESSION: 1. Asymmetric edema throughout the subcutaneous tissues of the right lower leg which may reflect soft tissue infection (cellulitis). 2. Unchanged focal subcutaneous fluid collection laterally in the mid right lower leg which measures up to 8.3 cm, similar to recent ultrasound. This has a nonspecific appearance and could  reflect a hematoma or subcutaneous abscess. Consider aspiration. 3. Nonspecific mild T2 hyperintensity throughout the muscles of the anterior and deep posterior compartments, which could reflect subacute denervation or myositis. No focal intramuscular fluid collection or tendon tear identified. Possible mild peroneal tenosynovitis. 4. No evidence of osteomyelitis or septic arthritis in the right lower leg. Electronically Signed   By: Richardean Sale M.D.   On: 10/29/2021 18:56   DG Tibia/Fibula Right  Result Date: 10/28/2021 CLINICAL DATA:  Swelling of the right lower extremity EXAM: RIGHT TIBIA AND FIBULA - 2 VIEW COMPARISON:  None Available. FINDINGS: No acute displaced fracture or malalignment. Generalized edema within the subcutaneous soft tissues. Wound or ulcer along the medial aspect of the lower leg and slightly above the ankle. No soft tissue emphysema IMPRESSION: 1. No acute osseous abnormality 2. Marked soft tissue edema with suspected wound or ulcer along the medial aspect of the lower leg Electronically Signed   By: Donavan Foil M.D.   On: 10/28/2021 23:54   US RENAL  Result Date: 10/28/2021 CLINICAL DATA:  Acute kidney injury EXAM: RENAL / URINARY TRACT ULTRASOUND COMPLETE COMPARISON:  None Available. FINDINGS: Right Kidney: Renal measurements: 10.3 x 4.9 x 5 cm = volume: 131 mL. Echogenicity within normal limits. No mass or hydronephrosis visualized. Left Kidney: Renal measurements: 9.4 x 5.6 x 5.4 cm = volume: 148 mL. Small cysts demonstrated along the midpole and medial midpole of the left kidney. Largest measures 1.5 cm in diameter. Appearance is consistent with benign cyst. No additional imaging follow-up is indicated. Echogenicity within normal limits. No solid mass or hydronephrosis visualized. Bladder: Appears normal for degree of bladder distention. Other: None. IMPRESSION: Benign-appearing left renal cysts. No hydronephrosis in the kidneys. Electronically Signed   By: Lucienne Capers M.D.   On: 10/28/2021 23:53   DG Chest Port 1 View  Result Date: 10/28/2021 CLINICAL DATA:  Abnormal laboratory values EXAM: PORTABLE CHEST 1 VIEW COMPARISON:  08/18/2016 FINDINGS: The heart size and mediastinal contours are within normal limits. Both lungs are clear. The visualized skeletal structures are unremarkable. IMPRESSION: No active disease. Electronically Signed   By: Donavan Foil M.D.   On: 10/28/2021 18:40   - pertinent xrays, CT, MRI studies were reviewed and independently interpreted  Positive ROS: All other systems have been reviewed and were otherwise negative with the exception of those mentioned in the HPI and as above.  Physical Exam: General: Alert, no acute distress Psychiatric: Patient is competent for consent with normal mood and affect Lymphatic: No axillary or cervical lymphadenopathy  Cardiovascular: No pedal edema Respiratory: No cyanosis, no use of accessory musculature GI: No organomegaly, abdomen is soft and non-tender    Images:  '@ENCIMAGES'$ @  Labs:  Lab Results  Component Value Date   HGBA1C 5.5 09/16/2021   HGBA1C 5.8 (H) 12/16/2019   ESRSEDRATE 47 (H) 10/29/2021   ESRSEDRATE 22 09/16/2021   ESRSEDRATE 4 07/26/2013   CRP 0.6 10/29/2021   LABURIC 8.3 (H) 09/16/2021   LABURIC 5.6 07/26/2013   REPTSTATUS PENDING 10/28/2021   CULT  10/28/2021    NO GROWTH 2 DAYS Performed at [facility], [place] [address], [place], [place] [address]     Lab Results  Component Value Date   ALBUMIN 2.4 (L) 10/29/2021   ALBUMIN 3.1 (L) 10/28/2021   ALBUMIN 3.3 (L) 09/16/2021   LABURIC 8.3 (H) 09/16/2021   LABURIC 5.6 07/26/2013        Latest Ref Rng & Units 10/29/2021   12:32 AM 10/28/2021    5:09 PM 10/28/2021    4:28 PM  CBC EXTENDED  WBC 4.0 - 10.5 K/uL   12.2   RBC 3.87 - 5.11 MIL/uL 2.29   2.78   Hemoglobin 12.0 - 15.0 g/dL  9.9  9.7   HCT 36.0 - 46.0 %  29.0  28.6   Platelets 150 - 400 K/uL   193   NEUT# 1.7 - 7.7 K/uL   8.5    Lymph# 0.7 - 4.0 K/uL   2.4     Neurologic: Patient does not have protective sensation bilateral lower extremities.   MUSCULOSKELETAL:   Skin: Examination patient has ischemic blisters over the anterior lateral aspect of the right leg.  I cannot palpate a good pulse in either foot.  Patient has healthy granulating wounds on the left leg which seem consistent with mixed arterial venous insufficiency.  Right leg has fluctuant swelling and ischemic ulcers.  Her calf is extremely tender to palpation.  Review of the MRI scan shows a large fluid collection over the fascia.  Patient has no claudication symptoms in her foot.  Hemoglobin 9.9.  Albumin 2.4.  Hemoglobin A1c 5.5.  Neutrophil to lymphocyte ratio is about 4.  No hyponatremia.  Assessment: Assessment: Patient's findings are consistent with early necrotizing fasciitis.   Plan: plan for a extensile incision over the anterior compartment.  Plan for placement of cleanse choice wound VAC sponges and anticipate return to the operating room on Friday.  We will send fascia and tissue for cultures.   Thank you for the consult and the opportunity to see Ms. Keith Rake, [place] [phone] 12:21 PM

## 2021-10-30 NOTE — Consult Note (Signed)
Reason for Consult:Right lower leg abscess Referring Physician: Barb Merino Time called: [id number] Time at bedside: Jessica Lynn is an 61 y.o. female.  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 In addition she c/o right lower leg swelling and pain since Thursday. It has gotten no better with oral Augmentin at home or overnight on IV abx. She denies prior hx/o similar, fevers, chills, sweats, N/V.  Past Medical History:  Diagnosis Date   Allergic rhinitis    year around    Allergy    Atypical chest pain    GERD (gastroesophageal reflux disease)    Hypercholesterolemia    Mild hypertension    Obesity    Postherpetic neuralgia    from shingles   Shingles    Snoring    Somatic dysfunction     Past Surgical History:  Procedure Laterality Date   DILATION AND CURETTAGE OF UTERUS     x2 w/ miscarriages   TUBAL LIGATION     VAGINAL DELIVERY     x2    Family History  Problem Relation Age of Onset   Colon cancer Brother 60   Colon polyps Mother    Colon polyps Father    Colon cancer Maternal Uncle    Esophageal cancer Neg Hx    Rectal cancer Neg Hx    Stomach cancer Neg Hx     Social History:  reports that she quit smoking about 15 years ago. Her smoking use included cigarettes. She has a 0.80 pack-year smoking history. She has never used smokeless tobacco. She reports that she does not drink alcohol and does not use drugs.  Allergies:  Allergies  Allergen Reactions   Crestor [Rosuvastatin] Other (See Comments)    cramps   Oxycodone Itching    Medications: I have reviewed the patient's current medications.  Results for orders placed or performed during the hospital encounter of 10/28/21 (from the past 48 hour(s))  Basic metabolic panel     Status: Abnormal   Collection Time: 10/28/21  4:28 PM  Result Value Ref Range   Sodium 133 (L) 135 - 145 mmol/L   Potassium 4.3 3.5 - 5.1 mmol/L   Chloride 99 98 - 111  mmol/L   CO2 23 22 - 32 mmol/L   Glucose, Bld 115 (H) 70 - 99 mg/dL    Comment: Glucose reference range applies only to samples taken after fasting for at least 8 hours.   BUN 40 (H) 6 - 20 mg/dL   Creatinine, Ser 2.98 (H) 0.44 - 1.00 mg/dL   Calcium 9.2 8.9 - 10.3 mg/dL   GFR, Estimated 18 (L) >60 mL/min    Comment: (NOTE) Calculated using the CKD-EPI Creatinine Equation (2021)    Anion gap 11 5 - 15    Comment: Performed at [facility] [address] [place], [place] [address]  CBC with Differential     Status: Abnormal   Collection Time: 10/28/21  4:28 PM  Result Value Ref Range   WBC 12.2 (H) 4.0 - 10.5 K/uL   RBC 2.78 (L) 3.87 - 5.11 MIL/uL   Hemoglobin 9.7 (L) 12.0 - 15.0 g/dL   HCT 28.6 (L) 36.0 - 46.0 %   MCV 102.9 (H) 80.0 - 100.0 fL   MCH 34.9 (H) 26.0 - 34.0 pg   MCHC 33.9 30.0 - 36.0 g/dL   RDW 12.5 11.5 - 15.5 %   Platelets 193 150 - 400  K/uL   nRBC 0.0 0.0 - 0.2 %   Neutrophils Relative % 70 %   Neutro Abs 8.5 (H) 1.7 - 7.7 K/uL   Lymphocytes Relative 20 %   Lymphs Abs 2.4 0.7 - 4.0 K/uL   Monocytes Relative 9 %   Monocytes Absolute 1.1 (H) 0.1 - 1.0 K/uL   Eosinophils Relative 0 %   Eosinophils Absolute 0.0 0.0 - 0.5 K/uL   Basophils Relative 0 %   Basophils Absolute 0.0 0.0 - 0.1 K/uL   Immature Granulocytes 1 %   Abs Immature Granulocytes 0.10 (H) 0.00 - 0.07 K/uL    Comment: Performed at [facility] [address], [place], [place] [address]  I-stat chem 8, ED     Status: Abnormal   Collection Time: 10/28/21  5:09 PM  Result Value Ref Range   Sodium 130 (L) 135 - 145 mmol/L   Potassium 4.3 3.5 - 5.1 mmol/L   Chloride 98 98 - 111 mmol/L   BUN 37 (H) 6 - 20 mg/dL   Creatinine, Ser 3.30 (H) 0.44 - 1.00 mg/dL   Glucose, Bld 108 (H) 70 - 99 mg/dL    Comment: Glucose reference range applies only to samples taken after fasting for at least 8 hours.   Calcium, Ion 1.16 1.15 - 1.40 mmol/L   TCO2 22 22 - 32 mmol/L   Hemoglobin 9.9 (L) 12.0 -  15.0 g/dL   HCT 29.0 (L) 36.0 - 46.0 %  Lactic acid, plasma     Status: None   Collection Time: 10/28/21  6:14 PM  Result Value Ref Range   Lactic Acid, Venous 1.6 0.5 - 1.9 mmol/L    Comment: Performed at [facility] [address], [place], [place] [address]  Protime-INR     Status: None   Collection Time: 10/28/21  6:14 PM  Result Value Ref Range   Prothrombin Time 14.2 11.4 - 15.2 seconds   INR 1.1 0.8 - 1.2    Comment: (NOTE) INR goal varies based on device and disease states. Performed at [facility], [place] [address], [place], [place] [address]   APTT     Status: None   Collection Time: 10/28/21  6:14 PM  Result Value Ref Range   aPTT 25 24 - 36 seconds    Comment: Performed at [facility] [address], [place], [place] [address]  Hepatic function panel     Status: Abnormal   Collection Time: 10/28/21  6:14 PM  Result Value Ref Range   Total Protein 6.4 (L) 6.5 - 8.1 g/dL   Albumin 3.1 (L) 3.5 - 5.0 g/dL   AST 31 15 - 41 U/L   ALT 32 0 - 44 U/L   Alkaline Phosphatase 78 38 - 126 U/L   Total Bilirubin 0.9 0.3 - 1.2 mg/dL   Bilirubin, Direct 0.2 0.0 - 0.2 mg/dL   Indirect Bilirubin 0.7 0.3 - 0.9 mg/dL    Comment: Performed at [facility] [address], [place], [place] [address]  Brain natriuretic peptide     Status: None   Collection Time: 10/28/21  6:14 PM  Result Value Ref Range   B Natriuretic Peptide 29.5 0.0 - 100.0 pg/mL    Comment: Performed at [facility] [address] [place], [place] [address]  Blood Culture (routine x 2)     Status: None (Preliminary result)   Collection Time: 10/28/21  6:45 PM  Specimen: Right Antecubital; Blood  Result Value Ref Range   Specimen Description RIGHT ANTECUBITAL    Special Requests      BOTTLES DRAWN AEROBIC AND ANAEROBIC Blood Culture adequate volume   Culture      NO GROWTH 2 DAYS Performed at [facility], [place] [address] [place], [place] [address]    Report Status  PENDING   Lactic acid, plasma     Status: Abnormal   Collection Time: 10/28/21  9:33 PM  Result Value Ref Range   Lactic Acid, Venous 2.4 (HH) 0.5 - 1.9 mmol/L    Comment: CRITICAL RESULT CALLED TO, READ BACK BY AND VERIFIED WITH C. GESELL RN, 2244, 10/28/21, EADEDOKUN Performed at [facility], [place] [address], [place], [place] [address]   Urinalysis, Routine w reflex microscopic Urine, Clean Catch     Status: Abnormal   Collection Time: 10/28/21 10:59 PM  Result Value Ref Range   Color, Urine YELLOW YELLOW   APPearance HAZY (A) CLEAR   Specific Gravity, Urine 1.006 1.005 - 1.030   pH 5.0 5.0 - 8.0   Glucose, UA NEGATIVE NEGATIVE mg/dL   Hgb urine dipstick NEGATIVE NEGATIVE   Bilirubin Urine NEGATIVE NEGATIVE   Ketones, ur NEGATIVE NEGATIVE mg/dL   Protein, ur NEGATIVE NEGATIVE mg/dL   Nitrite NEGATIVE NEGATIVE   Leukocytes,Ua TRACE (A) NEGATIVE   RBC / HPF 0-5 0 - 5 RBC/hpf   WBC, UA 0-5 0 - 5 WBC/hpf   Bacteria, UA RARE (A) NONE SEEN   Squamous Epithelial / LPF 0-5 0 - 5   Mucus PRESENT    Hyaline Casts, UA PRESENT     Comment: Performed at [facility], [place] [address], [place], [place] [address]  Osmolality, urine     Status: Abnormal   Collection Time: 10/28/21 10:59 PM  Result Value Ref Range   Osmolality, Ur 211 (L) 300 - 900 mOsm/kg    Comment: Performed at [facility] [address] [place], [place] [address]  Creatinine, urine, random     Status: None   Collection Time: 10/28/21 10:59 PM  Result Value Ref Range   Creatinine, Urine 65 mg/dL    Comment: Performed at [facility] [address], [place], [place] [address]  MRSA Next Gen by PCR, Nasal     Status: None   Collection Time: 10/28/21 11:11 PM   Specimen: Nasal Mucosa; Nasal Swab  Result Value Ref Range   MRSA by PCR Next Gen NOT DETECTED NOT DETECTED    Comment: (NOTE) The GeneXpert MRSA Assay (FDA approved for NASAL specimens only), is one component of a comprehensive MRSA  colonization surveillance program. It is not intended to diagnose MRSA infection nor to guide or monitor treatment for MRSA infections. Test performance is not FDA approved in patients less than 79 years old. Performed at [facility], [place] [address], [place], [place] [address]   Lactic acid, plasma     Status: None   Collection Time: 10/29/21 12:32 AM  Result Value Ref Range   Lactic Acid, Venous 1.3 0.5 - 1.9 mmol/L    Comment: Performed at [facility] [address], [place], [place] [address]  APTT     Status: Abnormal   Collection Time: 10/29/21 12:32 AM  Result Value Ref Range   aPTT 22 (L) 24 - 36 seconds    Comment: Performed at [facility], [place],  [place] [address]  Comprehensive metabolic panel     Status: Abnormal   Collection Time: 10/29/21 12:32 AM  Result Value Ref Range   Sodium 133 (L) 135 - 145 mmol/L   Potassium 3.9 3.5 - 5.1 mmol/L   Chloride 101 98 - 111 mmol/L   CO2 21 (L) 22 - 32 mmol/L   Glucose, Bld 94 70 - 99 mg/dL    Comment: Glucose reference range applies only to samples taken after fasting for at least 8 hours.   BUN 36 (H) 6 - 20 mg/dL   Creatinine, Ser 2.14 (H) 0.44 - 1.00 mg/dL    Comment: DELTA CHECK NOTED   Calcium 8.6 (L) 8.9 - 10.3 mg/dL   Total Protein 4.8 (L) 6.5 - 8.1 g/dL   Albumin 2.4 (L) 3.5 - 5.0 g/dL   AST 24 15 - 41 U/L   ALT 24 0 - 44 U/L   Alkaline Phosphatase 63 38 - 126 U/L   Total Bilirubin 0.9 0.3 - 1.2 mg/dL   GFR, Estimated 26 (L) >60 mL/min    Comment: (NOTE) Calculated using the CKD-EPI Creatinine Equation (2021)    Anion gap 11 5 - 15    Comment: Performed at [facility] [address] [place], [place] [address]  Procalcitonin     Status: None   Collection Time: 10/29/21 12:32 AM  Result Value Ref Range   Procalcitonin <0.10 ng/mL    Comment:        Interpretation: PCT (Procalcitonin) <= 0.5 ng/mL: Systemic infection (sepsis) is not likely. Local bacterial  infection is possible. (NOTE)       Sepsis PCT Algorithm           Lower Respiratory Tract                                      Infection PCT Algorithm    ----------------------------     ----------------------------         PCT < 0.25 ng/mL                PCT < 0.10 ng/mL          Strongly encourage             Strongly discourage   discontinuation of antibiotics    initiation of antibiotics    ----------------------------     -----------------------------       PCT 0.25 - 0.50 ng/mL            PCT 0.10 - 0.25 ng/mL               OR       >80% decrease in PCT            Discourage initiation of                                            antibiotics      Encourage discontinuation           of antibiotics    ----------------------------     -----------------------------         PCT >= 0.50 ng/mL              PCT 0.26 - 0.50 ng/mL  AND        <80% decrease in PCT             Encourage initiation of                                             antibiotics       Encourage continuation           of antibiotics    ----------------------------     -----------------------------        PCT >= 0.50 ng/mL                  PCT > 0.50 ng/mL               AND         increase in PCT                  Strongly encourage                                      initiation of antibiotics    Strongly encourage escalation           of antibiotics                                     -----------------------------                                           PCT <= 0.25 ng/mL                                                 OR                                        > 80% decrease in PCT                                      Discontinue / Do not initiate                                             antibiotics  Performed at [facility], [place] [address], [place], [place] [address]   Sedimentation rate     Status: Abnormal   Collection Time: 10/29/21 12:32 AM  Result Value Ref Range   Sed  Rate 47 (H) 0 - 22 mm/hr    Comment: Performed at [facility] [address], [place], [place] [address]  C-reactive protein     Status: None   Collection Time: 10/29/21 12:32 AM  Result Value Ref Range   CRP 0.6 <1.0 mg/dL    Comment: Performed at [facility],  [address] [place], [place] [address]  CK     Status: None   Collection Time: 10/29/21 12:32 AM  Result Value Ref Range   Total CK 110 38 - 234 U/L    Comment: Performed at [facility], [place] [address], [place], [place] [address]  Osmolality     Status: None   Collection Time: 10/29/21 12:32 AM  Result Value Ref Range   Osmolality 292 275 - 295 mOsm/kg    Comment: Performed at [facility] [address], [place], [place] [address]  Phosphorus     Status: None   Collection Time: 10/29/21 12:32 AM  Result Value Ref Range   Phosphorus 3.8 2.5 - 4.6 mg/dL    Comment: Performed at [facility], [place] [address], [place], [place] [address]  Magnesium     Status: None   Collection Time: 10/29/21 12:32 AM  Result Value Ref Range   Magnesium 1.7 1.7 - 2.4 mg/dL    Comment: Performed at [facility], [place] [address], [place], [place] [address]  ANA w/Reflex if Positive     Status: None   Collection Time: 10/29/21 12:32 AM  Result Value Ref Range   Anti Nuclear Antibody (ANA) Negative Negative    Comment: (NOTE) Performed At: [facility] [place], [place] [id number] Rush Farmer MD [id number]   Vitamin B12     Status: None   Collection Time: 10/29/21 12:32 AM  Result Value Ref Range   Vitamin B-12 303 180 - 914 pg/mL    Comment: (NOTE) This assay is not validated for testing neonatal or myeloproliferative syndrome specimens for Vitamin B12 levels. Performed at [facility], [place] [address], [place], [place] [address]   Folate     Status: None   Collection Time: 10/29/21 12:32 AM  Result Value Ref Range   Folate >40.0 >5.9 ng/mL    Comment:  Performed at [facility] [address], [place], [place] [address]  Iron and TIBC     Status: None   Collection Time: 10/29/21 12:32 AM  Result Value Ref Range   Iron 38 28 - 170 ug/dL   TIBC 263 250 - 450 ug/dL   Saturation Ratios 14 10.4 - 31.8 %   UIBC 225 ug/dL    Comment: Performed at [facility], [place] [address] [place], [place] [address]  Ferritin     Status: Abnormal   Collection Time: 10/29/21 12:32 AM  Result Value Ref Range   Ferritin 520 (H) 11 - 307 ng/mL    Comment: Performed at [facility], [place] [address] [place], [place] [address]  Reticulocytes     Status: Abnormal   Collection Time: 10/29/21 12:32 AM  Result Value Ref Range   Retic Ct Pct 0.9 0.4 - 3.1 %   RBC. 2.29 (L) 3.87 - 5.11 MIL/uL   Retic Count, Absolute 21.3 19.0 - 186.0 K/uL   Immature Retic Fract 13.0 2.3 - 15.9 %    Comment: Performed at [facility] [address] [place], [place] [address]  HIV Antibody (routine testing w rflx)     Status: None   Collection Time: 10/29/21 12:32 AM  Result Value Ref Range   HIV Screen 4th Generation wRfx Non Reactive Non Reactive    Comment: Performed at [facility], [place] [address], [place], [place] [address]  Lactic acid, plasma  Status: None   Collection Time: 10/29/21  4:14 AM  Result Value Ref Range   Lactic Acid, Venous 1.0 0.5 - 1.9 mmol/L    Comment: Performed at [facility] [address], [place], [place] [address]  Cortisol     Status: None   Collection Time: 10/29/21 11:00 PM  Result Value Ref Range   Cortisol, Plasma 5.3 ug/dL    Comment: (NOTE) AM    6.7 - 22.6 ug/dL PM   <10.0       ug/dL Performed at [facility] [address], [place], [place] [address]   Lactic acid, plasma     Status: None   Collection Time: 10/29/21 11:00 PM  Result Value Ref Range   Lactic Acid, Venous 1.4 0.5 - 1.9 mmol/L    Comment: Performed at [facility] [address] [place], [place] [address]   Lactic acid, plasma     Status: None   Collection Time: 10/30/21 12:45 AM  Result Value Ref Range   Lactic Acid, Venous 1.8 0.5 - 1.9 mmol/L    Comment: Performed at [facility] [address], [place], [place] [address]  Basic metabolic panel     Status: Abnormal   Collection Time: 10/30/21  4:50 AM  Result Value Ref Range   Sodium 141 135 - 145 mmol/L    Comment: DELTA CHECK NOTED   Potassium 4.0 3.5 - 5.1 mmol/L   Chloride 113 (H) 98 - 111 mmol/L   CO2 23 22 - 32 mmol/L   Glucose, Bld 122 (H) 70 - 99 mg/dL    Comment: Glucose reference range applies only to samples taken after fasting for at least 8 hours.   BUN 17 6 - 20 mg/dL   Creatinine, Ser 1.06 (H) 0.44 - 1.00 mg/dL    Comment: DELTA CHECK NOTED   Calcium 8.0 (L) 8.9 - 10.3 mg/dL   GFR, Estimated >60 >60 mL/min    Comment: (NOTE) Calculated using the CKD-EPI Creatinine Equation (2021)    Anion gap 5 5 - 15    Comment: Performed at [facility] [address], [place], [place] [address]  Magnesium     Status: Abnormal   Collection Time: 10/30/21  4:50 AM  Result Value Ref Range   Magnesium 1.3 (L) 1.7 - 2.4 mg/dL    Comment: Performed at [facility] [address] [place], [place] [address]  Phosphorus     Status: None   Collection Time: 10/30/21  4:50 AM  Result Value Ref Range   Phosphorus 3.1 2.5 - 4.6 mg/dL    Comment: Performed at [facility] [address] [place], [place] [address]  CK     Status: None   Collection Time: 10/30/21  4:50 AM  Result Value Ref Range   Total CK 63 38 - 234 U/L    Comment: Performed at [facility], [place] [address] [place], [place] [address]    MR TIBIA FIBULA RIGHT WO CONTRAST  Result Date: 10/29/2021 CLINICAL DATA:  Right lateral leg pain and swelling concerning for abscess/myositis. Immunocompromised patient with sepsis. EXAM: MRI OF LOWER RIGHT EXTREMITY WITHOUT CONTRAST TECHNIQUE: Multiplanar, multisequence MR imaging of the right  lower leg was performed. No intravenous contrast was administered. COMPARISON:  Radiographs 10/28/2021.  Ultrasound 10/24/2021. FINDINGS: Bones/Joint/Cartilage Both lower legs are included on the coronal images. There is no evidence of acute fracture or dislocation. Tricompartmental degenerative  changes are present in both knees. No evidence of bone marrow edema or bone destruction. No significant right knee or ankle joint effusion identified. Ligaments Not relevant for exam/indication. Muscles and Tendons There is mild muscular T2 hyperintensity throughout the right lower leg anterior and deep posterior compartments. No focal atrophy on the T1 weighted images. No evidence of focal fluid collection. The patellar tendon appears intact. The visualized ankle tendons are intact. Possible mild peroneal tenosynovitis. Soft tissues As seen on recent ultrasound, there is a large subcutaneous fluid collection laterally in right lower leg. This measures approximately 8.3 x 3.4 x 1.7 cm (8.4 x 4.5 x 1.3 cm on ultrasound). This collection demonstrates intermediate T1 and high T2 signal. No other focal fluid collections are identified. There is edema throughout the subcutaneous fat of the right lower leg which is asymmetric relative to the left lower leg. IMPRESSION: 1. Asymmetric edema throughout the subcutaneous tissues of the right lower leg which may reflect soft tissue infection (cellulitis). 2. Unchanged focal subcutaneous fluid collection laterally in the mid right lower leg which measures up to 8.3 cm, similar to recent ultrasound. This has a nonspecific appearance and could reflect a hematoma or subcutaneous abscess. Consider aspiration. 3. Nonspecific mild T2 hyperintensity throughout the muscles of the anterior and deep posterior compartments, which could reflect subacute denervation or myositis. No focal intramuscular fluid collection or tendon tear identified. Possible mild peroneal tenosynovitis. 4. No evidence of  osteomyelitis or septic arthritis in the right lower leg. Electronically Signed   By: Richardean Sale M.D.   On: 10/29/2021 18:56   DG Tibia/Fibula Right  Result Date: 10/28/2021 CLINICAL DATA:  Swelling of the right lower extremity EXAM: RIGHT TIBIA AND FIBULA - 2 VIEW COMPARISON:  None Available. FINDINGS: No acute displaced fracture or malalignment. Generalized edema within the subcutaneous soft tissues. Wound or ulcer along the medial aspect of the lower leg and slightly above the ankle. No soft tissue emphysema IMPRESSION: 1. No acute osseous abnormality 2. Marked soft tissue edema with suspected wound or ulcer along the medial aspect of the lower leg Electronically Signed   By: Donavan Foil M.D.   On: 10/28/2021 23:54   US RENAL  Result Date: 10/28/2021 CLINICAL DATA:  Acute kidney injury EXAM: RENAL / URINARY TRACT ULTRASOUND COMPLETE COMPARISON:  None Available. FINDINGS: Right Kidney: Renal measurements: 10.3 x 4.9 x 5 cm = volume: 131 mL. Echogenicity within normal limits. No mass or hydronephrosis visualized. Left Kidney: Renal measurements: 9.4 x 5.6 x 5.4 cm = volume: 148 mL. Small cysts demonstrated along the midpole and medial midpole of the left kidney. Largest measures 1.5 cm in diameter. Appearance is consistent with benign cyst. No additional imaging follow-up is indicated. Echogenicity within normal limits. No solid mass or hydronephrosis visualized. Bladder: Appears normal for degree of bladder distention. Other: None. IMPRESSION: Benign-appearing left renal cysts. No hydronephrosis in the kidneys. Electronically Signed   By: Lucienne Capers M.D.   On: 10/28/2021 23:53   DG Chest Port 1 View  Result Date: 10/28/2021 CLINICAL DATA:  Abnormal laboratory values EXAM: PORTABLE CHEST 1 VIEW COMPARISON:  08/18/2016 FINDINGS: The heart size and mediastinal contours are within normal limits. Both lungs are clear. The visualized skeletal structures are unremarkable. IMPRESSION: No active  disease. Electronically Signed   By: Donavan Foil M.D.   On: 10/28/2021 18:40    Review of Systems  Constitutional:  Negative for chills, diaphoresis and fatigue.  HENT:  Negative for ear discharge, ear pain,  hearing loss and tinnitus.   Eyes:  Negative for photophobia and pain.  Respiratory:  Negative for cough and shortness of breath.   Cardiovascular:  Positive for leg swelling. Negative for chest pain.  Gastrointestinal:  Negative for abdominal pain, nausea and vomiting.  Genitourinary:  Negative for dysuria, flank pain, frequency and urgency.  Musculoskeletal:  Positive for myalgias (Right calf). Negative for back pain and neck pain.  Neurological:  Negative for dizziness and headaches.  Hematological:  Does not bruise/bleed easily.  Psychiatric/Behavioral:  The patient is not nervous/anxious.    Blood pressure (!) 88/38, pulse 84, temperature 98.7 F (37.1 C), temperature source Oral, resp. rate 20, height '5\' 5"'$  (1.651 m), weight 78.5 kg, last menstrual period 06/06/2015, SpO2 100 %. Physical Exam Constitutional:      General: She is not in acute distress.    Appearance: She is well-developed. She is not diaphoretic.  HENT:     Head: Normocephalic and atraumatic.  Eyes:     General: No scleral icterus.       Right eye: No discharge.        Left eye: No discharge.     Conjunctiva/sclera: Conjunctivae normal.  Cardiovascular:     Rate and Rhythm: Normal rate and regular rhythm.  Pulmonary:     Effort: Pulmonary effort is normal. No respiratory distress.  Musculoskeletal:     Cervical back: Normal range of motion.     Comments: RLE Small ulceration medial mid-calf, no ecchymosis, or rash. Fluctuance lateral mid-calf, mod-severe TTP  No knee or ankle effusion  Knee stable to varus/ valgus and anterior/posterior stress  Sens DPN, SPN, TN intact  Motor EHL, ext, flex, evers 5/5  DP 2+, PT 1+, No significant edema  Skin:    General: Skin is warm and dry.  Neurological:      Mental Status: She is alert.  Psychiatric:        Mood and Affect: Mood normal.        Behavior: Behavior normal.     Assessment/Plan: RLE abscess -- Dr. Sharol Given to evaluate and decide on I&D. Please keep NPO for now.    Lisette Abu, PA-C Orthopedic Surgery [phone] 10/30/2021, 9:10 AM

## 2021-10-30 NOTE — Progress Notes (Signed)
PHARMACY NOTE:  ANTIMICROBIAL RENAL DOSAGE ADJUSTMENT  Current antimicrobial regimen includes a mismatch between antimicrobial dosage and estimated renal function.  As per policy approved by the Pharmacy & Therapeutics and Medical Executive Committees, the antimicrobial dosage will be adjusted accordingly.  Current antimicrobial dosage:  Daptomycin 500 mg every 48 hours   Indication: Cellulitis   Renal Function:  Estimated Creatinine Clearance: 59.2 mL/min (A) (by C-G formula based on SCr of 1.06 mg/dL (H)). '[]'$      On intermittent HD, scheduled: '[]'$      On CRRT    Antimicrobial dosage has been changed to:  Daptomycin 500 mg every 24 hours   Additional comments: SCr much improved today so will increase dose for renal function   Thank you for allowing pharmacy to be a part of this patient's care.  Jimmy Footman, PharmD, BCPS, BCIDP Infectious Diseases Clinical Pharmacist Phone: [phone] 10/30/2021 8:00 AM

## 2021-10-31 ENCOUNTER — Encounter (HOSPITAL_COMMUNITY): Payer: Self-pay | Admitting: Orthopedic Surgery

## 2021-10-31 ENCOUNTER — Inpatient Hospital Stay (HOSPITAL_COMMUNITY): Payer: BC Managed Care – PPO

## 2021-10-31 DIAGNOSIS — R6520 Severe sepsis without septic shock: Secondary | ICD-10-CM | POA: Diagnosis not present

## 2021-10-31 DIAGNOSIS — A419 Sepsis, unspecified organism: Secondary | ICD-10-CM | POA: Diagnosis not present

## 2021-10-31 DIAGNOSIS — N179 Acute kidney failure, unspecified: Secondary | ICD-10-CM | POA: Diagnosis not present

## 2021-10-31 DIAGNOSIS — L02415 Cutaneous abscess of right lower limb: Secondary | ICD-10-CM | POA: Diagnosis not present

## 2021-10-31 DIAGNOSIS — L0390 Cellulitis, unspecified: Secondary | ICD-10-CM | POA: Diagnosis not present

## 2021-10-31 LAB — CBC WITH DIFFERENTIAL/PLATELET
Abs Immature Granulocytes: 0.07 10*3/uL (ref 0.00–0.07)
Basophils Absolute: 0 10*3/uL (ref 0.0–0.1)
Basophils Relative: 0 %
Eosinophils Absolute: 0 10*3/uL (ref 0.0–0.5)
Eosinophils Relative: 0 %
HCT: 21.6 % — ABNORMAL LOW (ref 36.0–46.0)
Hemoglobin: 7.3 g/dL — ABNORMAL LOW (ref 12.0–15.0)
Immature Granulocytes: 1 %
Lymphocytes Relative: 19 %
Lymphs Abs: 1.5 10*3/uL (ref 0.7–4.0)
MCH: 34.8 pg — ABNORMAL HIGH (ref 26.0–34.0)
MCHC: 33.8 g/dL (ref 30.0–36.0)
MCV: 102.9 fL — ABNORMAL HIGH (ref 80.0–100.0)
Monocytes Absolute: 0.4 10*3/uL (ref 0.1–1.0)
Monocytes Relative: 4 %
Neutro Abs: 6.2 10*3/uL (ref 1.7–7.7)
Neutrophils Relative %: 76 %
Platelets: 156 10*3/uL (ref 150–400)
RBC: 2.1 MIL/uL — ABNORMAL LOW (ref 3.87–5.11)
RDW: 12.5 % (ref 11.5–15.5)
WBC: 8.2 10*3/uL (ref 4.0–10.5)
nRBC: 0 % (ref 0.0–0.2)

## 2021-10-31 LAB — BASIC METABOLIC PANEL
Anion gap: 4 — ABNORMAL LOW (ref 5–15)
BUN: 12 mg/dL (ref 6–20)
CO2: 23 mmol/L (ref 22–32)
Calcium: 8.3 mg/dL — ABNORMAL LOW (ref 8.9–10.3)
Chloride: 109 mmol/L (ref 98–111)
Creatinine, Ser: 0.93 mg/dL (ref 0.44–1.00)
GFR, Estimated: >60 mL/min (ref >60)
Glucose, Bld: 145 mg/dL — ABNORMAL HIGH (ref 70–99)
Potassium: 4.2 mmol/L (ref 3.5–5.1)
Sodium: 136 mmol/L (ref 135–145)

## 2021-10-31 LAB — VITAMIN D 25 HYDROXY (VIT D DEFICIENCY, FRACTURES): Vit D, 25-Hydroxy: 42.96 ng/mL (ref 30–100)

## 2021-10-31 LAB — C-REACTIVE PROTEIN: CRP: 0.5 mg/dL (ref <1.0)

## 2021-10-31 LAB — MAGNESIUM: Magnesium: 1.8 mg/dL (ref 1.7–2.4)

## 2021-10-31 MED ORDER — PROSOURCE PLUS PO LIQD
30.0000 mL | Freq: Three times a day (TID) | ORAL | Status: DC
  Administered 2021-10-31 – 2021-11-03 (×8): 30 mL via ORAL
  Filled 2021-10-31 (×10): qty 30

## 2021-10-31 MED ORDER — ASCORBIC ACID 500 MG PO TABS
500.0000 mg | ORAL_TABLET | Freq: Two times a day (BID) | ORAL | Status: DC
  Administered 2021-10-31 – 2021-11-05 (×9): 500 mg via ORAL
  Filled 2021-10-31 (×9): qty 1

## 2021-10-31 MED ORDER — ENOXAPARIN SODIUM 40 MG/0.4ML IJ SOSY
40.0000 mg | PREFILLED_SYRINGE | INTRAMUSCULAR | Status: DC
Start: 2021-10-31 — End: 2021-11-05
  Administered 2021-10-31 – 2021-11-04 (×5): 40 mg via SUBCUTANEOUS
  Filled 2021-10-31 (×5): qty 0.4

## 2021-10-31 MED ORDER — ZINC SULFATE 220 (50 ZN) MG PO CAPS
220.0000 mg | ORAL_CAPSULE | Freq: Every day | ORAL | Status: DC
Start: 2021-10-31 — End: 2021-11-05
  Administered 2021-10-31 – 2021-11-05 (×5): 220 mg via ORAL
  Filled 2021-10-31 (×5): qty 1

## 2021-10-31 MED ORDER — ADULT MULTIVITAMIN W/MINERALS CH
1.0000 | ORAL_TABLET | Freq: Every day | ORAL | Status: DC
  Administered 2021-10-31 – 2021-11-05 (×5): 1 via ORAL
  Filled 2021-10-31 (×5): qty 1

## 2021-10-31 NOTE — Progress Notes (Signed)
Patient ID: Jessica Lynn, female   DOB: 04-Jun-1960, 61 y.o.   MRN: [id number] Patient is a 61 year old woman who is postoperative day 1 debridement necrotizing fasciitis anterior and lateral compartments right leg.  Both deep tissue cultures are showing gram-positive cocci.    Patient states the abscess blisters developed while she was being seen at the wound center.  Patient was referred from the wound center for an ultrasound on the 20th which showed a deep abscess.  Patient states that she was not called regarding the ultrasound findings.  Patient states she then followed up with her rheumatologist, blood work was obtained, and when she was arriving at home she was called by her rheumatologist to go to the emergency room.  Patient was admitted at [facility] on the 24th and a MRI scan was obtained on the 25th confirming the abscess in the right leg. Orthopedics consulted on the 26th, with initial debridement surgery on the 26th.  Plan for return to the operating room tomorrow for repeat debridement.  Anticipate serial debridements until wound closure.

## 2021-10-31 NOTE — H&P (View-Only) (Signed)
Patient ID: Jessica Lynn, female   DOB: 02/28/1961, 61 y.o.   MRN: [id number] Patient is a 61 year old woman who is postoperative day 1 debridement necrotizing fasciitis anterior and lateral compartments right leg.  Both deep tissue cultures are showing gram-positive cocci.    Patient states the abscess blisters developed while she was being seen at the wound center.  Patient was referred from the wound center for an ultrasound on the 20th which showed a deep abscess.  Patient states that she was not called regarding the ultrasound findings.  Patient states she then followed up with her rheumatologist, blood work was obtained, and when she was arriving at home she was called by her rheumatologist to go to the emergency room.  Patient was admitted at [facility] on the 24th and a MRI scan was obtained on the 25th confirming the abscess in the right leg. Orthopedics consulted on the 26th, with initial debridement surgery on the 26th.  Plan for return to the operating room tomorrow for repeat debridement.  Anticipate serial debridements until wound closure.

## 2021-10-31 NOTE — Progress Notes (Signed)
PROGRESS NOTE    Jessica Lynn  UKG:[id number] DOB: Jun 13, 1960 DOA: 10/28/2021 PCP: Cassandria Anger, MD    Brief Narrative:  61 year old with history of chronic bilateral extremity ulcers, retinal vasculitis presented to the emergency room with abnormal kidney functions called by her rheumatologist.  Patient is taken care of at wound care clinic and also followed by rheumatologist.  Recently treated with Humira and subsequently with prednisone that was tapered off.  She went to rheumatologist yesterday who did a routine blood test and found to have AKI, labs unavailable with previous normal renal functions so was asked to go to emergency room.  Recently taken Augmentin.  Seen at wound care clinic few days ago. Initially in the emergency room she was noted to be hypotensive requiring 4 L of fluid, blood cultures were drawn and she was started on antibiotics. Patient was found to have a pocket of infection on the right leg, underwent incision debridement and wound VAC placement.   Assessment & Plan:   Severe sepsis present on admission: Likely secondary to superadded infection, leg cellulitis and abscess.  Presented with hypotension, lactic acidosis and WBC count of 12.2.  Started on multiple antibiotics.  Now on daptomycin as per ID recommendation. Incision drainage and debridement of the skin subcutaneous tissue and fascia 7/26.  Redo surgery tomorrow planned. Surgical cultures are pending. Remains on daptomycin.  Acute kidney injury: Prerenal in the setting of sepsis and hypotension.   Aggressively resuscitated.  Kidney functions normalized.   Patient was on losartan hydrochlorothiazide as well as Lasix at home. Renal ultrasound essentially normal.  Urine output is adequate.  Essential hypertension: Blood pressures low normal.  Hold all antihypertensives.  Hypotension: Probably due to #2.  She was recently on prednisone and that was tapered off.  Do not expect adrenal insufficiency  with normal cortisol levels. Blood pressures are mostly more than 100. Improving perfusion to the kidneys and no evidence of orthostatic blood pressure drop, monitor.  Hypomagnesemia: Replaced aggressively.  Recheck tomorrow morning.  Anemia: Acute on chronic anemia.  Probably dilutional as well as surgical blood loss.  Hemoglobin 7.3.  Recheck tomorrow morning, transfuse for less than 7.     DVT prophylaxis: SCDs Start: 10/30/21 1954 SCDs Start: 10/28/21 2326   Code Status: Full code Family Communication: Daughter at the bedside Disposition Plan: Status is: Inpatient Remains inpatient appropriate because: Significant renal function abnormalities, leg wounds.  Inpatient surgical plans.     Consultants:  Infectious disease Wound care Orthopedics  Procedures:  None  Antimicrobials:  Daptomycin   Subjective: Seen and examined.  More comfortable today.  Not mobilized yet.  Blood pressures are adequate.  Afebrile.  Objective: Vitals:   10/30/21 1705 10/30/21 2050 10/31/21 0456 10/31/21 0924  BP: 120/84 113/80 (!) 91/58 110/77  Pulse: 91 92 79 80  Resp: '15 18 18 18  '$ Temp: 98.9 F (37.2 C) 97.7 F (36.5 C) 98.8 F (37.1 C) 98.4 F (36.9 C)  TempSrc: Oral Oral Oral Oral  SpO2: 100% 100% 98% 100%  Weight:      Height:        Intake/Output Summary (Last 24 hours) at 10/31/2021 1110 Last data filed at 10/31/2021 0800 Gross per 24 hour  Intake 1490.27 ml  Output 1575 ml  Net -84.73 ml   Filed Weights   10/29/21 2015 10/30/21 1216  Weight: 78.5 kg 78.5 kg    Examination:  General: Looks comfortable.  On room air. Cardiovascular: S1-S2 normal.  Regular rate rhythm.  Respiratory: Bilateral clear.  No added sounds. Gastrointestinal: Soft.  Nontender.  Bowel sound present. Ext: Right leg lateral incision clean and dry, wound VAC fitted, serosanguineous drainage on canister.  Moderate swelling on the dorsum of the foot. Neuro: Intact.      Data Reviewed: I  have personally reviewed following labs and imaging studies  CBC: Recent Labs  Lab 10/28/21 1628 10/28/21 1709 10/31/21 0330  WBC 12.2*  --  8.2  NEUTROABS 8.5*  --  6.2  HGB 9.7* 9.9* 7.3*  HCT 28.6* 29.0* 21.6*  MCV 102.9*  --  102.9*  PLT 193  --  974   Basic Metabolic Panel: Recent Labs  Lab 10/28/21 1628 10/28/21 1709 10/29/21 0032 10/30/21 0450 10/31/21 0330  NA 133* 130* 133* 141 136  K 4.3 4.3 3.9 4.0 4.2  CL 99 98 101 113* 109  CO2 23  --  21* 23 23  GLUCOSE 115* 108* 94 122* 145*  BUN 40* 37* 36* 17 12  CREATININE 2.98* 3.30* 2.14* 1.06* 0.93  CALCIUM 9.2  --  8.6* 8.0* 8.3*  MG  --   --  1.7 1.3* 1.8  PHOS  --   --  3.8 3.1  --    GFR: Estimated Creatinine Clearance: 67.5 mL/min (by C-G formula based on SCr of 0.93 mg/dL). Liver Function Tests: Recent Labs  Lab 10/28/21 1814 10/29/21 0032  AST 31 24  ALT 32 24  ALKPHOS 78 63  BILITOT 0.9 0.9  PROT 6.4* 4.8*  ALBUMIN 3.1* 2.4*   No results for input(s): "LIPASE", "AMYLASE" in the last 168 hours. No results for input(s): "AMMONIA" in the last 168 hours. Coagulation Profile: Recent Labs  Lab 10/28/21 1814  INR 1.1   Cardiac Enzymes: Recent Labs  Lab 10/29/21 0032 10/30/21 0450  CKTOTAL 110 63   BNP (last 3 results) No results for input(s): "PROBNP" in the last 8760 hours. HbA1C: No results for input(s): "HGBA1C" in the last 72 hours. CBG: No results for input(s): "GLUCAP" in the last 168 hours. Lipid Profile: No results for input(s): "CHOL", "HDL", "LDLCALC", "TRIG", "CHOLHDL", "LDLDIRECT" in the last 72 hours. Thyroid Function Tests: No results for input(s): "TSH", "T4TOTAL", "FREET4", "T3FREE", "THYROIDAB" in the last 72 hours. Anemia Panel: Recent Labs    10/29/21 0032  VITAMINB12 303  FOLATE >40.0  FERRITIN 520*  TIBC 263  IRON 38  RETICCTPCT 0.9   Sepsis Labs: Recent Labs  Lab 10/29/21 0032 10/29/21 0414 10/29/21 2300 10/30/21 0045  PROCALCITON <0.10  --   --   --    LATICACIDVEN 1.3 1.0 1.4 1.8    Recent Results (from the past 240 hour(s))  Blood Culture (routine x 2)     Status: None (Preliminary result)   Collection Time: 10/28/21  6:45 PM   Specimen: Right Antecubital; Blood  Result Value Ref Range Status   Specimen Description RIGHT ANTECUBITAL  Final   Special Requests   Final    BOTTLES DRAWN AEROBIC AND ANAEROBIC Blood Culture adequate volume   Culture   Final    NO GROWTH 3 DAYS Performed at [facility], [place] [address], [place], [place] [address]    Report Status PENDING  Incomplete  MRSA Next Gen by PCR, Nasal     Status: None   Collection Time: 10/28/21 11:11 PM   Specimen: Nasal Mucosa; Nasal Swab  Result Value Ref Range Status   MRSA by PCR Next Gen NOT DETECTED NOT DETECTED Final  Comment: (NOTE) The GeneXpert MRSA Assay (FDA approved for NASAL specimens only), is one component of a comprehensive MRSA colonization surveillance program. It is not intended to diagnose MRSA infection nor to guide or monitor treatment for MRSA infections. Test performance is not FDA approved in patients less than 75 years old. Performed at [facility], [place] [address], [place], [place] [address]   Aerobic/Anaerobic Culture w Gram Stain (surgical/deep wound)     Status: None (Preliminary result)   Collection Time: 10/30/21  3:15 PM   Specimen: PATH Soft tissue  Result Value Ref Range Status   Specimen Description TISSUE RIGHT LEG  Final   Special Requests SAMPLE A PROXIMAL  Final   Gram Stain   Final    ABUNDANT WBC PRESENT, PREDOMINANTLY PMN MODERATE GRAM POSITIVE COCCI IN CLUSTERS    Culture   Final    CULTURE REINCUBATED FOR BETTER GROWTH Performed at [facility], [place] [address], [place], [place] [address]    Report Status PENDING  Incomplete  Aerobic/Anaerobic Culture w Gram Stain (surgical/deep wound)     Status: None (Preliminary result)   Collection Time: 10/30/21  3:19 PM   Specimen: PATH Soft tissue   Result Value Ref Range Status   Specimen Description TISSUE RIGHT LEG  Final   Special Requests SAMPLE B DISTAL  Final   Gram Stain   Final    ABUNDANT WBC PRESENT, PREDOMINANTLY PMN RARE GRAM POSITIVE COCCI    Culture   Final    CULTURE REINCUBATED FOR BETTER GROWTH Performed at [facility], [place] [address] [place],  [address]    Report Status PENDING  Incomplete         Radiology Studies: MR TIBIA FIBULA RIGHT WO CONTRAST  Result Date: 10/29/2021 CLINICAL DATA:  Right lateral leg pain and swelling concerning for abscess/myositis. Immunocompromised patient with sepsis. EXAM: MRI OF LOWER RIGHT EXTREMITY WITHOUT CONTRAST TECHNIQUE: Multiplanar, multisequence MR imaging of the right lower leg was performed. No intravenous contrast was administered. COMPARISON:  Radiographs 10/28/2021.  Ultrasound 10/24/2021. FINDINGS: Bones/Joint/Cartilage Both lower legs are included on the coronal images. There is no evidence of acute fracture or dislocation. Tricompartmental degenerative changes are present in both knees. No evidence of bone marrow edema or bone destruction. No significant right knee or ankle joint effusion identified. Ligaments Not relevant for exam/indication. Muscles and Tendons There is mild muscular T2 hyperintensity throughout the right lower leg anterior and deep posterior compartments. No focal atrophy on the T1 weighted images. No evidence of focal fluid collection. The patellar tendon appears intact. The visualized ankle tendons are intact. Possible mild peroneal tenosynovitis. Soft tissues As seen on recent ultrasound, there is a large subcutaneous fluid collection laterally in right lower leg. This measures approximately 8.3 x 3.4 x 1.7 cm (8.4 x 4.5 x 1.3 cm on ultrasound). This collection demonstrates intermediate T1 and high T2 signal. No other focal fluid collections are identified. There is edema throughout the subcutaneous fat of the right lower leg which is  asymmetric relative to the left lower leg. IMPRESSION: 1. Asymmetric edema throughout the subcutaneous tissues of the right lower leg which may reflect soft tissue infection (cellulitis). 2. Unchanged focal subcutaneous fluid collection laterally in the mid right lower leg which measures up to 8.3 cm, similar to recent ultrasound. This has a nonspecific appearance and could reflect a hematoma or subcutaneous abscess. Consider aspiration. 3. Nonspecific mild T2 hyperintensity throughout the muscles of the anterior and deep posterior  compartments, which could reflect subacute denervation or myositis. No focal intramuscular fluid collection or tendon tear identified. Possible mild peroneal tenosynovitis. 4. No evidence of osteomyelitis or septic arthritis in the right lower leg. Electronically Signed   By: Richardean Sale M.D.   On: 10/29/2021 18:56        Scheduled Meds:  collagenase   Topical Daily   docusate sodium  100 mg Oral BID   dorzolamide-timolol  1 drop Both Eyes BID   montelukast  10 mg Oral QHS   nutrition supplement (JUVEN)  1 packet Oral BID BM   prednisoLONE acetate  1 drop Both Eyes QID   Continuous Infusions:  sodium chloride Stopped (10/30/21 2301)   DAPTOmycin (CUBICIN) 500 mg in sodium chloride 0.9 % IVPB Stopped (10/30/21 2201)   lactated ringers Stopped (10/30/21 1946)   methocarbamol (ROBAXIN) IV       LOS: 3 days    Time spent: 35 minutes    Barb Merino, MD [facility] Pager [phone]

## 2021-10-31 NOTE — Plan of Care (Signed)
  Problem: Clinical Measurements: Goal: Diagnostic test results will improve Outcome: Progressing   Problem: Clinical Measurements: Goal: Signs and symptoms of infection will decrease Outcome: Progressing   Problem: Skin Integrity: Goal: Skin integrity will improve Outcome: Progressing

## 2021-10-31 NOTE — Progress Notes (Signed)
IP rehab admissions - I met with patient and discussed possible need for inpatient rehab once all OR procedures are completed.  I gave patient rehab booklet.  Noted therapies recommending CIR versus HH depending on progress.  I will follow along for progress and rehab needs.  Call for questions.  [phone]

## 2021-10-31 NOTE — Progress Notes (Signed)
Physical Therapy Re-Evaluation and Treatment Patient Details Name: Jessica Lynn MRN: [id number] DOB: 05-Jan-1961 Today's Date: 10/31/2021   History of Present Illness Pt is a 61 y/o female admitted 7/24 secondary to increased swelling in RLE and chronic wounds on BLE. Thought to be secondary to cellulitis; workup pending. PMH includes HTN and GERD.    PT Comments    Pt supine in bed, daughter reports pt with R UE tremors just prior to PT entry. Pt not noted to have tremors and able to perform finger to nose with no problem. No tremors noted throughout session. Pt with 6/10 pain and fear about moving with therapy. RN notified and IV pain med administered. Pt able to come to EoB with min A and stand with modA. Takes lateral steps towards HoB with modA. Transport present and request back to bed. Pt has not had a significant change in her mobility since surgery and is making good progress towards her goals today. D/c plan remains appropriate at this time.    Recommendations for follow up therapy are one component of a multi-disciplinary discharge planning process, led by the attending physician.  Recommendations may be updated based on patient status, additional functional criteria and insurance authorization.  Follow Up Recommendations  Acute inpatient rehab (3hours/day) (vs HHPT pending progression)     Assistance Recommended at Discharge Intermittent Supervision/Assistance  Patient can return home with the following A lot of help with walking and/or transfers;A lot of help with bathing/dressing/bathroom;Assistance with cooking/housework;Help with stairs or ramp for entrance;Assist for transportation   Equipment Recommendations  Rolling walker (2 wheels);BSC/3in1       Precautions / Restrictions Precautions Precautions: Fall Restrictions Weight Bearing Restrictions: Yes RLE Weight Bearing: Weight bearing as tolerated     Mobility  Bed Mobility Overal bed mobility: Needs  Assistance Bed Mobility: Supine to Sit, Sit to Supine     Supine to sit: Min assist Sit to supine: Min assist   General bed mobility comments: able to come to longsitting unassisted, requires min A for managemnt of R LE across bed and to floor    Transfers Overall transfer level: Needs assistance Equipment used: Rolling walker (2 wheels) Transfers: Sit to/from Stand, Bed to chair/wheelchair/BSC Sit to Stand: Mod assist           General transfer comment: requires 2 attempts and modA for coming to standing in RW, vc for sequencing and posterior rotation of hips and chest up to come to standing, able to take lateral steps to left to move hips up in the bed for transfer to Vascular    Ambulation/Gait               General Gait Details: deferred due to need to go to Vascular         Balance Overall balance assessment: Needs assistance Sitting-balance support: No upper extremity supported, Feet supported Sitting balance-[name]: Fair     Standing balance support: Bilateral upper extremity supported Standing balance-[name]: Poor Standing balance comment: Reliant on UE and external support                            Cognition Arousal/Alertness: Awake/alert Behavior During Therapy: WFL for tasks assessed/performed Overall Cognitive Status: Within Functional Limits for tasks assessed  General Comments General comments (skin integrity, edema, etc.): c/o of R arm tremors prior to PT entry, not present while PT in room and pt with good R UE finger to nose coordination      Pertinent Vitals/Pain Pain Assessment Pain Assessment: 0-10 Pain Score: 6  Pain Location: LLE Pain Descriptors / Indicators: Sharp, Throbbing Pain Intervention(s): Limited activity within patient's tolerance, Monitored during session, Patient requesting pain meds-RN notified, RN gave pain meds during session,  Repositioned     PT Goals (current goals can now be found in the care plan section) Acute Rehab PT Goals Patient Stated Goal: to decrease pain PT Goal Formulation: With patient Time For Goal Achievement: 11/12/21 Potential to Achieve Goals: Good Progress towards PT goals: Progressing toward goals    Frequency    Min 3X/week      PT Plan Current plan remains appropriate       AM-PAC PT "6 Clicks" Mobility   Outcome Measure  Help needed turning from your back to your side while in a flat bed without using bedrails?: A Little Help needed moving from lying on your back to sitting on the side of a flat bed without using bedrails?: A Little Help needed moving to and from a bed to a chair (including a wheelchair)?: A Lot Help needed standing up from a chair using your arms (e.g., wheelchair or bedside chair)?: A Lot Help needed to walk in hospital room?: A Lot Help needed climbing 3-5 steps with a railing? : Total 6 Click Score: 13    End of Session Equipment Utilized During Treatment: Gait belt Activity Tolerance: Patient limited by pain Patient left: in bed;with call bell/phone within reach;with family/visitor present Nurse Communication: Mobility status PT Visit Diagnosis: Other abnormalities of gait and mobility (R26.89);Difficulty in walking, not elsewhere classified (R26.2);Pain Pain - Right/Left: Right Pain - part of body: Leg     Time: 1022-1101 PT Time Calculation (min) (ACUTE ONLY): 39 min  Charges:  1 Re-Eval $Therapeutic Activity: 23-37 mins                     Calyse Murcia B. Migdalia Dk PT, DPT Acute Rehabilitation Services Please use secure chat or  Call Office [phone]    [place] 10/31/2021, 11:16 AM

## 2021-10-31 NOTE — Progress Notes (Signed)
VASCULAR LAB    ABI has been performed.  See CV proc for preliminary results.   Thierry Dobosz, RVT 10/31/2021, 12:21 PM

## 2021-10-31 NOTE — Progress Notes (Signed)
OT Cancellation Note  Patient Details Name: Jessica Lynn MRN: [id number] DOB: [birth year]   Cancelled Treatment:    Reason Eval/Treat Not Completed: Patient at procedure or test/ unavailable (patient off unit for procedure. Will attempt again as schedule permits.) Lodema Hong, [place]  Office [place] 10/31/2021, 12:21 PM

## 2021-10-31 NOTE — Progress Notes (Signed)
Occupational Therapy Treatment Patient Details Name: Jessica Lynn MRN: [id number] DOB: [birth year] Today's Date: 10/31/2021   History of present illness Pt is a 61 y/o female admitted 7/24 secondary to increased swelling in RLE and chronic wounds on BLE. Thought to be secondary to cellulitis; workup pending. PMH includes HTN and GERD.   OT comments  Patient received in supine and stated she had increased pain at RLE following procedure. Patient declined mobility or standing but was willing to perform OT seated on EOB. Patient able to get to EOB with verbal cues and min guard assist. Patient provided printed HEP and yellow therapy band. Patient instructed on exercises and demonstrated good understanding. Acute OT to continue to follow.    Recommendations for follow up therapy are one component of a multi-disciplinary discharge planning process, led by the attending physician.  Recommendations may be updated based on patient status, additional functional criteria and insurance authorization.    Follow Up Recommendations  Acute inpatient rehab (3hours/day)    Assistance Recommended at Discharge Frequent or constant Supervision/Assistance  Patient can return home with the following  A lot of help with walking and/or transfers;A lot of help with bathing/dressing/bathroom;Assistance with cooking/housework;Assist for transportation;Help with stairs or ramp for entrance   Equipment Recommendations  Other (comment);BSC/3in1 (RW)    Recommendations for Other Services      Precautions / Restrictions Precautions Precautions: Fall Restrictions Weight Bearing Restrictions: Yes RLE Weight Bearing: Weight bearing as tolerated       Mobility Bed Mobility Overal bed mobility: Needs Assistance Bed Mobility: Supine to Sit     Supine to sit: Min guard     General bed mobility comments: min guard and increased time to perform supine to si    Transfers Overall transfer level: Needs  assistance                 General transfer comment: declined standing or transfers due to RLE pain     Balance Overall balance assessment: Needs assistance Sitting-balance support: No upper extremity supported, Feet supported Sitting balance-[name]: Fair Sitting balance - Comments: able to perform UE exercises seated on EOB       Standing balance comment: declined standing                           ADL either performed or assessed with clinical judgement   ADL                                              Extremity/Trunk Assessment              Vision       Perception     Praxis      Cognition Arousal/Alertness: Awake/alert Behavior During Therapy: WFL for tasks assessed/performed Overall Cognitive Status: Within Functional Limits for tasks assessed                                          Exercises Exercises: General Upper Extremity General Exercises - Upper Extremity Shoulder Flexion: Strengthening, 10 reps, Both, Seated, Theraband Theraband Level (Shoulder Flexion): Level 1 (Yellow) Shoulder ABduction: Strengthening, Both, 15 reps, Seated, Theraband Theraband Level (Shoulder Abduction): Level 1 (Yellow) Elbow Flexion: Strengthening, Both, 20 reps,  Seated, Theraband Theraband Level (Elbow Flexion): Level 1 (Yellow) Elbow Extension: Strengthening, Both, 20 reps, Seated, Theraband Theraband Level (Elbow Extension): Level 1 (Yellow)    Shoulder Instructions       General Comments c/o of R arm tremors prior to PT entry, not present while PT in room and pt with good R UE finger to nose coordination    Pertinent Vitals/ Pain       Pain Assessment Pain Assessment: Faces Faces Pain Scale: Hurts little more Pain Location: LLE Pain Descriptors / Indicators: Aching, Sharp, Throbbing Pain Intervention(s): Limited activity within patient's tolerance, Monitored during session, Repositioned  Home Living                                           Prior Functioning/Environment              Frequency  Min 2X/week        Progress Toward Goals  OT Goals(current goals can now be found in the care plan section)  Progress towards OT goals: Progressing toward goals  Acute Rehab OT Goals Patient Stated Goal: get better OT Goal Formulation: With patient Time For Goal Achievement: 11/12/21 Potential to Achieve Goals: Good ADL Goals Pt Will Perform Grooming: with modified independence;standing Pt Will Perform Lower Body Dressing: with modified independence;sit to/from stand;with adaptive equipment Pt Will Transfer to Toilet: with modified independence;ambulating Additional ADL Goal #1: Pt to increase standing activity tolerance > 10 min during ADLs/IADL tasks to improve overall endurance  Plan Discharge plan remains appropriate    Co-evaluation                 AM-PAC OT "6 Clicks" Daily Activity     Outcome Measure   Help from another person eating meals?: None Help from another person taking care of personal grooming?: A Little Help from another person toileting, which includes using toliet, bedpan, or urinal?: A Lot Help from another person bathing (including washing, rinsing, drying)?: A Lot Help from another person to put on and taking off regular upper body clothing?: A Little Help from another person to put on and taking off regular lower body clothing?: A Lot 6 Click Score: 16    End of Session    OT Visit Diagnosis: Unsteadiness on feet (R26.81);Pain Pain - Right/Left: Right Pain - part of body: Leg   Activity Tolerance Patient limited by pain   Patient Left in bed;with call bell/phone within reach;with family/visitor present   Nurse Communication Mobility status        Time: 4481-8563 OT Time Calculation (min): 21 min  Charges: OT General Charges $OT Visit: 1 Visit OT Treatments $Therapeutic Exercise: 8-22 mins  Lodema Hong,  [place]  Office [place] 10/31/2021, 2:47 PM

## 2021-10-31 NOTE — Progress Notes (Signed)
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 Since she had gone off prednisone her jittery/tremors had resolved but hasn't been feeling like eating/drinking much   She was evaluated by rheum a day prior to presentation and advised to come to ed for AKI. Incidentally she has been complaining of right calf severe pain/swelling for a week. Her blood pressure has been low here and she has a mild leukocytosis on presentation. No fever documented   She has chronic bilateral LE ulcers that are improving even off prednisone. She recently in June developed some kind of hives like reaction to ?humira and some of the lesions ulcerated in the bilateral LE but again improved     #sepsis #right calf swelling concerned for staphylococcal abscess/myositis I do not appreciate obvious sign of adrenal crisis in setting of her recent high dose/long term prednisone use Admission bcx ngtd Mri RLE showed abscess/myositis; s/p I&D 7/26. Cx gpc clusters    #chronic bilateral leg ulcers Improving.  She saw rheumatology 7/24 for evaluation so will need to f/u.  With reported improvement and off immunosuppression and no abx prior. Doesn't appear to be vasculitis or infectious.   #aki Suspect due to dehydration given quick improvement. Possibly some sepsis component as well If improvement nadirs, would consider secondary nephritic syndrome or renal vasculitis given her known retinal vasculitis/uveitis      Plan: Continue daptomycin Ortho plan more I&D  this week Once all I&D is done, likely will change to oral abx  Discussed with primary team   I spent more than 35 minute reviewing data/chart, and coordinating care and >50% direct face to face time providing counseling/discussing diagnostics/treatment plan with patient   Principal Problem:   Severe sepsis ([place]) Active Problems:   Essential hypertension   Dyslipidemia   Uveitis of both eyes   Cellulitis   Hyponatremia   AKI (acute kidney injury) ([place])   Anemia   Acute kidney injury (AKI) with acute tubular necrosis (ATN) ([facility])   Necrotizing fasciitis ([place])   Abscess of right lower leg   Allergies  Allergen Reactions   Crestor [Rosuvastatin] Other (See Comments)    cramps   Oxycodone Itching    Scheduled Meds:  collagenase   Topical Daily   docusate sodium  100 mg Oral BID   dorzolamide-timolol  1 drop Both Eyes BID   montelukast  10 mg Oral QHS   nutrition supplement (JUVEN)  1 packet Oral BID BM   prednisoLONE acetate  1 drop Both Eyes QID   Continuous Infusions:  sodium chloride Stopped (10/30/21 2301)   DAPTOmycin (CUBICIN) 500 mg in sodium chloride 0.9 % IVPB Stopped (10/30/21 2201)   lactated ringers Stopped (10/30/21 1946)   methocarbamol (ROBAXIN) IV     PRN Meds:.acetaminophen, bisacodyl, HYDROcodone-acetaminophen, HYDROcodone-acetaminophen, HYDROmorphone (  DILAUDID) injection, methocarbamol **OR** methocarbamol (ROBAXIN) IV, metoCLOPramide **OR** metoCLOPramide (REGLAN) injection, morphine injection, ondansetron **OR** ondansetron (ZOFRAN) IV, polyethylene glycol   SUBJECTIVE: Rle s/p I&D by dr Sharol Given 7/26; cx growing gpc clusters Afebrile Pain better RLE   Review of Systems: ROS All other ROS was negative, except mentioned above     OBJECTIVE: Vitals:   10/30/21 1705 10/30/21 2050 10/31/21 0456 10/31/21 0924  BP: 120/84 113/80 (!) 91/58 110/77  Pulse: 91 92 79 80  Resp: '15 18 18 18  '$ Temp: 98.9 F (37.2 C) 97.7 F (36.5 C) 98.8 F (37.1 C)  98.4 F (36.9 C)  TempSrc: Oral Oral Oral Oral  SpO2: 100% 100% 98% 100%  Weight:      Height:       Body mass index is 28.79 kg/m.  Physical Exam  General/constitutional: no distress, pleasant HEENT: Normocephalic, PER, Conj Clear, EOMI, Oropharynx clear Neck supple CV: rrr no mrg Lungs: clear to auscultation, normal respiratory effort Abd: Soft, Nontender Ext: no edema Skin/msk; stable ulcer clean based left LE; right LE lateral abscess s/p debridement with wound vac in place and functioning; no changes in rle clean based medial ulcers Neuro: nonfocal    Lab Results Lab Results  Component Value Date   WBC 8.2 10/31/2021   HGB 7.3 (L) 10/31/2021   HCT 21.6 (L) 10/31/2021   MCV 102.9 (H) 10/31/2021   PLT 156 10/31/2021    Lab Results  Component Value Date   CREATININE 0.93 10/31/2021   BUN 12 10/31/2021   NA 136 10/31/2021   K 4.2 10/31/2021   CL 109 10/31/2021   CO2 23 10/31/2021    Lab Results  Component Value Date   ALT 24 10/29/2021   AST 24 10/29/2021   ALKPHOS 63 10/29/2021   BILITOT 0.9 10/29/2021      Microbiology: Recent Results (from the past 240 hour(s))  Blood Culture (routine x 2)     Status: None (Preliminary result)   Collection Time: 10/28/21  6:45 PM   Specimen: Right Antecubital; Blood  Result Value Ref Range Status   Specimen Description RIGHT ANTECUBITAL  Final   Special Requests   Final    BOTTLES DRAWN AEROBIC AND ANAEROBIC Blood Culture adequate volume   Culture   Final    NO GROWTH 3 DAYS Performed at [facility], [address], [place], [place] [address]    Report Status PENDING  Incomplete  MRSA Next Gen by PCR, Nasal     Status: None   Collection Time: 10/28/21 11:11 PM   Specimen: Nasal Mucosa; Nasal Swab  Result Value Ref Range Status   MRSA by PCR Next Gen NOT DETECTED NOT DETECTED Final    Comment: (NOTE) The GeneXpert MRSA Assay (FDA approved for NASAL specimens only), is one component of a comprehensive  MRSA colonization surveillance program. It is not intended to diagnose MRSA infection nor to guide or monitor treatment for MRSA infections. Test performance is not FDA approved in patients less than 34 years old. Performed at [facility], [place] [address], [place], [place] [address]   Aerobic/Anaerobic Culture w Gram Stain (surgical/deep wound)     Status: None (Preliminary result)   Collection Time: 10/30/21  3:15 PM   Specimen: PATH Soft tissue  Result Value Ref Range Status   Specimen Description TISSUE RIGHT LEG  Final   Special Requests SAMPLE A PROXIMAL  Final   Gram Stain   Final    ABUNDANT WBC  PRESENT, PREDOMINANTLY PMN MODERATE GRAM POSITIVE COCCI IN CLUSTERS    Culture   Final    CULTURE REINCUBATED FOR BETTER GROWTH Performed at [facility], [place] [address] [place], [place] [address]    Report Status PENDING  Incomplete  Aerobic/Anaerobic Culture w Gram Stain (surgical/deep wound)     Status: None (Preliminary result)   Collection Time: 10/30/21  3:19 PM   Specimen: PATH Soft tissue  Result Value Ref Range Status   Specimen Description TISSUE RIGHT LEG  Final   Special Requests SAMPLE B DISTAL  Final   Gram Stain   Final    ABUNDANT WBC PRESENT, PREDOMINANTLY PMN RARE GRAM POSITIVE COCCI    Culture   Final    CULTURE REINCUBATED FOR BETTER GROWTH Performed at [facility], [place] [address], [place], [place] [address]    Report Status PENDING  Incomplete     Serology:   Imaging: If present, new imagings (plain films, ct scans, and mri) have been personally visualized and interpreted; radiology reports have been reviewed. Decision making incorporated into the Impression / Recommendations.   7/24 cxr FINDINGS: The heart size and mediastinal contours are within normal limits. Both lungs are clear. The visualized skeletal structures are unremarkable.   IMPRESSION: No active disease.     7/24 xray right tib-fib FINDINGS: No acute  displaced fracture or malalignment. Generalized edema within the subcutaneous soft tissues. Wound or ulcer along the medial aspect of the lower leg and slightly above the ankle. No soft tissue emphysema   IMPRESSION: 1. No acute osseous abnormality 2. Marked soft tissue edema with suspected wound or ulcer along the medial aspect of the lower leg  Jabier Mutton, [facility] [place] [phone] pager    10/31/2021, 12:49 PM

## 2021-11-01 ENCOUNTER — Inpatient Hospital Stay (HOSPITAL_COMMUNITY): Payer: BC Managed Care – PPO | Admitting: Anesthesiology

## 2021-11-01 ENCOUNTER — Encounter (HOSPITAL_COMMUNITY): Payer: Self-pay | Admitting: Internal Medicine

## 2021-11-01 ENCOUNTER — Encounter (HOSPITAL_COMMUNITY)
Admission: EM | Disposition: A | Discharge status: Home Health | Payer: Self-pay | Source: Home / Self Care | Attending: Internal Medicine

## 2021-11-01 DIAGNOSIS — R6520 Severe sepsis without septic shock: Secondary | ICD-10-CM | POA: Diagnosis not present

## 2021-11-01 DIAGNOSIS — N170 Acute kidney failure with tubular necrosis: Secondary | ICD-10-CM | POA: Diagnosis not present

## 2021-11-01 DIAGNOSIS — L02415 Cutaneous abscess of right lower limb: Secondary | ICD-10-CM | POA: Diagnosis not present

## 2021-11-01 DIAGNOSIS — A419 Sepsis, unspecified organism: Secondary | ICD-10-CM | POA: Diagnosis not present

## 2021-11-01 DIAGNOSIS — E43 Unspecified severe protein-calorie malnutrition: Secondary | ICD-10-CM | POA: Insufficient documentation

## 2021-11-01 HISTORY — PX: I & D EXTREMITY: SHX5045

## 2021-11-01 LAB — CBC WITH DIFFERENTIAL/PLATELET
Abs Immature Granulocytes: 0.12 10*3/uL — ABNORMAL HIGH (ref 0.00–0.07)
Basophils Absolute: 0 10*3/uL (ref 0.0–0.1)
Basophils Relative: 0 %
Eosinophils Absolute: 0 10*3/uL (ref 0.0–0.5)
Eosinophils Relative: 0 %
HCT: 19.4 % — ABNORMAL LOW (ref 36.0–46.0)
Hemoglobin: 6.6 g/dL — CL (ref 12.0–15.0)
Immature Granulocytes: 1 %
Lymphocytes Relative: 28 %
Lymphs Abs: 2.7 10*3/uL (ref 0.7–4.0)
MCH: 34.9 pg — ABNORMAL HIGH (ref 26.0–34.0)
MCHC: 34 g/dL (ref 30.0–36.0)
MCV: 102.6 fL — ABNORMAL HIGH (ref 80.0–100.0)
Monocytes Absolute: 0.9 10*3/uL (ref 0.1–1.0)
Monocytes Relative: 9 %
Neutro Abs: 6.2 10*3/uL (ref 1.7–7.7)
Neutrophils Relative %: 62 %
Platelets: 173 10*3/uL (ref 150–400)
RBC: 1.89 MIL/uL — ABNORMAL LOW (ref 3.87–5.11)
RDW: 12.6 % (ref 11.5–15.5)
WBC: 9.9 10*3/uL (ref 4.0–10.5)
nRBC: 0.2 % (ref 0.0–0.2)

## 2021-11-01 LAB — BASIC METABOLIC PANEL
Anion gap: 10 (ref 5–15)
BUN: 15 mg/dL (ref 6–20)
CO2: 22 mmol/L (ref 22–32)
Calcium: 8.6 mg/dL — ABNORMAL LOW (ref 8.9–10.3)
Chloride: 107 mmol/L (ref 98–111)
Creatinine, Ser: 0.96 mg/dL (ref 0.44–1.00)
GFR, Estimated: >60 mL/min (ref >60)
Glucose, Bld: 108 mg/dL — ABNORMAL HIGH (ref 70–99)
Potassium: 4 mmol/L (ref 3.5–5.1)
Sodium: 139 mmol/L (ref 135–145)

## 2021-11-01 LAB — PREPARE RBC (CROSSMATCH)

## 2021-11-01 LAB — POCT I-STAT, CHEM 8
BUN: 16 mg/dL (ref 6–20)
Calcium, Ion: 1.21 mmol/L (ref 1.15–1.40)
Chloride: 108 mmol/L (ref 98–111)
Creatinine, Ser: 0.9 mg/dL (ref 0.44–1.00)
Glucose, Bld: 93 mg/dL (ref 70–99)
HCT: 26 % — ABNORMAL LOW (ref 36.0–46.0)
Hemoglobin: 8.8 g/dL — ABNORMAL LOW (ref 12.0–15.0)
Potassium: 4.8 mmol/L (ref 3.5–5.1)
Sodium: 139 mmol/L (ref 135–145)
TCO2: 24 mmol/L (ref 22–32)

## 2021-11-01 LAB — ABO/RH: ABO/RH(D): A POS

## 2021-11-01 LAB — MAGNESIUM: Magnesium: 1.6 mg/dL — ABNORMAL LOW (ref 1.7–2.4)

## 2021-11-01 SURGERY — IRRIGATION AND DEBRIDEMENT EXTREMITY
Anesthesia: General | Site: Leg Lower | Laterality: Right

## 2021-11-01 MED ORDER — CEFAZOLIN SODIUM-DEXTROSE 2-4 GM/100ML-% IV SOLN
2.0000 g | INTRAVENOUS | Status: AC
  Administered 2021-11-01: 2 g via INTRAVENOUS

## 2021-11-01 MED ORDER — CHLORHEXIDINE GLUCONATE 0.12 % MT SOLN
OROMUCOSAL | Status: AC
  Administered 2021-11-01: 15 mL via OROMUCOSAL
  Filled 2021-11-01: qty 15

## 2021-11-01 MED ORDER — PHENYLEPHRINE 80 MCG/ML (10ML) SYRINGE FOR IV PUSH (FOR BLOOD PRESSURE SUPPORT)
PREFILLED_SYRINGE | INTRAVENOUS | Status: DC | PRN
  Administered 2021-11-01 (×2): 80 ug via INTRAVENOUS

## 2021-11-01 MED ORDER — METHOCARBAMOL 1000 MG/10ML IJ SOLN
500.0000 mg | Freq: Four times a day (QID) | INTRAVENOUS | Status: DC | PRN
  Filled 2021-11-01: qty 5

## 2021-11-01 MED ORDER — ONDANSETRON HCL 4 MG PO TABS: 4.0000 mg | ORAL_TABLET | Freq: Four times a day (QID) | ORAL | Status: DC | PRN

## 2021-11-01 MED ORDER — CEFAZOLIN SODIUM-DEXTROSE 2-4 GM/100ML-% IV SOLN
2.0000 g | Freq: Three times a day (TID) | INTRAVENOUS | Status: AC
Start: 2021-11-01 — End: 2021-11-04
  Administered 2021-11-01 – 2021-11-03 (×7): 2 g via INTRAVENOUS
  Filled 2021-11-01 (×8): qty 100

## 2021-11-01 MED ORDER — FENTANYL CITRATE (PF) 100 MCG/2ML IJ SOLN
25.0000 ug | INTRAMUSCULAR | Status: DC | PRN
  Administered 2021-11-01 (×2): 50 ug via INTRAVENOUS

## 2021-11-01 MED ORDER — OXYCODONE HCL 5 MG PO TABS: 5.0000 mg | ORAL_TABLET | Freq: Once | ORAL | Status: DC | PRN

## 2021-11-01 MED ORDER — POLYETHYLENE GLYCOL 3350 17 G PO PACK: 17.0000 g | PACK | Freq: Every day | ORAL | Status: DC | PRN

## 2021-11-01 MED ORDER — CEFADROXIL 500 MG PO CAPS
1000.0000 mg | ORAL_CAPSULE | Freq: Two times a day (BID) | ORAL | Status: DC
  Administered 2021-11-04 – 2021-11-05 (×3): 1000 mg via ORAL
  Filled 2021-11-01 (×6): qty 2

## 2021-11-01 MED ORDER — FENTANYL CITRATE (PF) 250 MCG/5ML IJ SOLN
INTRAMUSCULAR | Status: AC
  Filled 2021-11-01: qty 5

## 2021-11-01 MED ORDER — ONDANSETRON HCL 4 MG/2ML IJ SOLN
INTRAMUSCULAR | Status: AC
  Filled 2021-11-01: qty 2

## 2021-11-01 MED ORDER — 0.9 % SODIUM CHLORIDE (POUR BTL) OPTIME
TOPICAL | Status: DC | PRN
  Administered 2021-11-01: 1000 mL

## 2021-11-01 MED ORDER — DEXAMETHASONE SODIUM PHOSPHATE 10 MG/ML IJ SOLN
INTRAMUSCULAR | Status: DC | PRN
  Administered 2021-11-01: 5 mg via INTRAVENOUS

## 2021-11-01 MED ORDER — BISACODYL 10 MG RE SUPP: 10.0000 mg | Freq: Every day | RECTAL | Status: DC | PRN

## 2021-11-01 MED ORDER — LACTATED RINGERS IV SOLN: INTRAVENOUS | Status: DC

## 2021-11-01 MED ORDER — PHENYLEPHRINE 80 MCG/ML (10ML) SYRINGE FOR IV PUSH (FOR BLOOD PRESSURE SUPPORT)
PREFILLED_SYRINGE | INTRAVENOUS | Status: AC
  Filled 2021-11-01: qty 10

## 2021-11-01 MED ORDER — MAGNESIUM SULFATE 2 GM/50ML IV SOLN
2.0000 g | Freq: Once | INTRAVENOUS | Status: AC
  Administered 2021-11-01: 2 g via INTRAVENOUS
  Filled 2021-11-01: qty 50

## 2021-11-01 MED ORDER — SODIUM CHLORIDE 0.9 % IV SOLN
INTRAVENOUS | Status: DC
  Administered 2021-11-01: 250 mL via INTRAVENOUS

## 2021-11-01 MED ORDER — SODIUM CHLORIDE 0.9% IV SOLUTION
Freq: Once | INTRAVENOUS | Status: AC
Start: 2021-11-01 — End: 2021-11-01

## 2021-11-01 MED ORDER — CEFAZOLIN SODIUM-DEXTROSE 2-4 GM/100ML-% IV SOLN
INTRAVENOUS | Status: AC
  Filled 2021-11-01: qty 100

## 2021-11-01 MED ORDER — CHLORHEXIDINE GLUCONATE 0.12 % MT SOLN: 15.0000 mL | Freq: Once | OROMUCOSAL | Status: AC

## 2021-11-01 MED ORDER — METOCLOPRAMIDE HCL 5 MG/ML IJ SOLN: 5.0000 mg | Freq: Three times a day (TID) | INTRAMUSCULAR | Status: DC | PRN

## 2021-11-01 MED ORDER — OXYCODONE HCL 5 MG/5ML PO SOLN: 5.0000 mg | Freq: Once | ORAL | Status: DC | PRN

## 2021-11-01 MED ORDER — DOCUSATE SODIUM 100 MG PO CAPS
100.0000 mg | ORAL_CAPSULE | Freq: Two times a day (BID) | ORAL | Status: DC
Start: 2021-11-01 — End: 2021-11-05
  Administered 2021-11-01 – 2021-11-05 (×8): 100 mg via ORAL
  Filled 2021-11-01 (×8): qty 1

## 2021-11-01 MED ORDER — AMISULPRIDE (ANTIEMETIC) 5 MG/2ML IV SOLN: 10.0000 mg | Freq: Once | INTRAVENOUS | Status: DC | PRN

## 2021-11-01 MED ORDER — FENTANYL CITRATE (PF) 250 MCG/5ML IJ SOLN
INTRAMUSCULAR | Status: DC | PRN
  Administered 2021-11-01 (×3): 25 ug via INTRAVENOUS

## 2021-11-01 MED ORDER — PROPOFOL 10 MG/ML IV BOLUS
INTRAVENOUS | Status: AC
  Filled 2021-11-01: qty 20

## 2021-11-01 MED ORDER — PHENYLEPHRINE HCL-NACL 20-0.9 MG/250ML-% IV SOLN
INTRAVENOUS | Status: DC | PRN
  Administered 2021-11-01: 100 ug/min via INTRAVENOUS

## 2021-11-01 MED ORDER — ONDANSETRON HCL 4 MG/2ML IJ SOLN: 4.0000 mg | Freq: Four times a day (QID) | INTRAMUSCULAR | Status: DC | PRN

## 2021-11-01 MED ORDER — POVIDONE-IODINE 10 % EX SWAB
2.0000 | Freq: Once | CUTANEOUS | Status: AC
  Administered 2021-11-01: 2 via TOPICAL

## 2021-11-01 MED ORDER — ONDANSETRON HCL 4 MG/2ML IJ SOLN: 4.0000 mg | Freq: Once | INTRAMUSCULAR | Status: DC | PRN

## 2021-11-01 MED ORDER — LIDOCAINE 2% (20 MG/ML) 5 ML SYRINGE
INTRAMUSCULAR | Status: DC | PRN
  Administered 2021-11-01: 60 mg via INTRAVENOUS

## 2021-11-01 MED ORDER — CHLORHEXIDINE GLUCONATE 4 % EX LIQD
60.0000 mL | Freq: Once | CUTANEOUS | Status: AC
  Administered 2021-11-01: 4 via TOPICAL
  Filled 2021-11-01 (×2): qty 60

## 2021-11-01 MED ORDER — MIDAZOLAM HCL 5 MG/5ML IJ SOLN
INTRAMUSCULAR | Status: DC | PRN
  Administered 2021-11-01 (×2): 1 mg via INTRAVENOUS

## 2021-11-01 MED ORDER — MIDAZOLAM HCL 2 MG/2ML IJ SOLN
INTRAMUSCULAR | Status: AC
  Filled 2021-11-01: qty 2

## 2021-11-01 MED ORDER — DEXAMETHASONE SODIUM PHOSPHATE 10 MG/ML IJ SOLN
INTRAMUSCULAR | Status: AC
  Filled 2021-11-01: qty 1

## 2021-11-01 MED ORDER — ONDANSETRON HCL 4 MG/2ML IJ SOLN
INTRAMUSCULAR | Status: DC | PRN
  Administered 2021-11-01: 4 mg via INTRAVENOUS

## 2021-11-01 MED ORDER — FENTANYL CITRATE (PF) 100 MCG/2ML IJ SOLN
INTRAMUSCULAR | Status: AC
  Filled 2021-11-01: qty 2

## 2021-11-01 MED ORDER — METHOCARBAMOL 500 MG PO TABS
500.0000 mg | ORAL_TABLET | Freq: Four times a day (QID) | ORAL | Status: DC | PRN
  Administered 2021-11-05: 500 mg via ORAL
  Filled 2021-11-01: qty 1

## 2021-11-01 MED ORDER — ORAL CARE MOUTH RINSE: 15.0000 mL | Freq: Once | OROMUCOSAL | Status: AC

## 2021-11-01 MED ORDER — METOCLOPRAMIDE HCL 5 MG PO TABS: 5.0000 mg | ORAL_TABLET | Freq: Three times a day (TID) | ORAL | Status: DC | PRN

## 2021-11-01 MED ORDER — PROPOFOL 10 MG/ML IV BOLUS
INTRAVENOUS | Status: DC | PRN
  Administered 2021-11-01: 120 mg via INTRAVENOUS

## 2021-11-01 SURGICAL SUPPLY — 38 items
BAG COUNTER SPONGE SURGICOUNT (BAG) IMPLANT
BLADE SURG 21 STRL SS (BLADE) ×2 IMPLANT
BNDG COHESIVE 4X5 TAN ST LF (GAUZE/BANDAGES/DRESSINGS) ×2 IMPLANT
BNDG COHESIVE 6X5 TAN STRL LF (GAUZE/BANDAGES/DRESSINGS) IMPLANT
BNDG GAUZE ELAST 4 BULKY (GAUZE/BANDAGES/DRESSINGS) ×2 IMPLANT
CANISTER WOUND CARE 500ML ATS (WOUND CARE) ×1 IMPLANT
COVER SURGICAL LIGHT HANDLE (MISCELLANEOUS) ×4 IMPLANT
DRAPE DERMATAC (DRAPES) ×4 IMPLANT
DRAPE U-SHAPE 47X51 STRL (DRAPES) ×2 IMPLANT
DRESSING PREVENA PLUS CUSTOM (GAUZE/BANDAGES/DRESSINGS) IMPLANT
DRSG ADAPTIC 3X8 NADH LF (GAUZE/BANDAGES/DRESSINGS) ×1 IMPLANT
DRSG PREVENA PLUS CUSTOM (GAUZE/BANDAGES/DRESSINGS) ×2
DURAPREP 26ML APPLICATOR (WOUND CARE) ×2 IMPLANT
ELECT REM PT RETURN 9FT ADLT (ELECTROSURGICAL)
ELECTRODE REM PT RTRN 9FT ADLT (ELECTROSURGICAL) IMPLANT
GAUZE SPONGE 4X4 12PLY STRL (GAUZE/BANDAGES/DRESSINGS) ×1 IMPLANT
GLOVE BIOGEL PI IND STRL 9 (GLOVE) ×1 IMPLANT
GLOVE BIOGEL PI INDICATOR 9 (GLOVE) ×1
GLOVE SURG ORTHO 9.0 STRL STRW (GLOVE) ×2 IMPLANT
GOWN STRL REUS W/ TWL XL LVL3 (GOWN DISPOSABLE) ×2 IMPLANT
GOWN STRL REUS W/TWL XL LVL3 (GOWN DISPOSABLE) ×4
GRAFT SKIN WND SURGICLOSE M95 (Tissue) ×1 IMPLANT
HANDPIECE INTERPULSE COAX TIP (DISPOSABLE)
KIT BASIN OR (CUSTOM PROCEDURE TRAY) ×2 IMPLANT
KIT TURNOVER KIT B (KITS) ×2 IMPLANT
MANIFOLD NEPTUNE II (INSTRUMENTS) ×2 IMPLANT
NS IRRIG 1000ML POUR BTL (IV SOLUTION) ×2 IMPLANT
PACK ORTHO EXTREMITY (CUSTOM PROCEDURE TRAY) ×2 IMPLANT
PAD ARMBOARD 7.5X6 YLW CONV (MISCELLANEOUS) ×4 IMPLANT
PENCIL BUTTON HOLSTER BLD 10FT (ELECTRODE) ×1 IMPLANT
SET HNDPC FAN SPRY TIP SCT (DISPOSABLE) IMPLANT
STOCKINETTE IMPERVIOUS 9X36 MD (GAUZE/BANDAGES/DRESSINGS) IMPLANT
SUT ETHILON 2 0 PSLX (SUTURE) ×3 IMPLANT
SWAB COLLECTION DEVICE MRSA (MISCELLANEOUS) ×1 IMPLANT
SWAB CULTURE ESWAB REG 1ML (MISCELLANEOUS) IMPLANT
TOWEL GREEN STERILE (TOWEL DISPOSABLE) ×2 IMPLANT
TUBE CONNECTING 12X1/4 (SUCTIONS) ×2 IMPLANT
YANKAUER SUCT BULB TIP NO VENT (SUCTIONS) ×2 IMPLANT

## 2021-11-01 NOTE — Anesthesia Postprocedure Evaluation (Signed)
Anesthesia Post Note  Patient: Jessica Lynn  Procedure(s) Performed: DEBRIDEMENT RIGHT LEG, WOUND VAC CHANGE (Right: Leg Lower)     Patient location during evaluation: PACU Anesthesia Type: General Level of consciousness: awake and alert Pain management: pain level controlled Vital Signs Assessment: post-procedure vital signs reviewed and stable Respiratory status: spontaneous breathing, nonlabored ventilation and respiratory function stable Cardiovascular status: blood pressure returned to baseline and stable Postop Assessment: no apparent nausea or vomiting Anesthetic complications: no   No notable events documented.  Last Vitals:  Vitals:   11/01/21 1430 11/01/21 1508  BP: 92/76 112/87  Pulse: 72 81  Resp: 12 14  Temp: 36.6 C 36.7 C  SpO2: 100% 100%    Last Pain:  Vitals:   11/01/21 1508  TempSrc: Oral  PainSc: [place]

## 2021-11-01 NOTE — Anesthesia Procedure Notes (Signed)
Procedure Name: LMA Insertion Date/Time: 11/01/2021 1:18 PM  Performed by: Michele Rockers, CRNAPre-anesthesia Checklist: Patient identified, Emergency Drugs available, Suction available, Patient being monitored and Timeout performed Patient Re-evaluated:Patient Re-evaluated prior to induction Oxygen Delivery Method: Circle system utilized Preoxygenation: Pre-oxygenation with 100% oxygen Induction Type: IV induction Ventilation: Mask ventilation without difficulty LMA: LMA flexible inserted LMA Size: 4.0 Number of attempts: 1 Placement Confirmation: breath sounds checked- equal and bilateral and positive ETCO2 Tube secured with: Tape Dental Injury: Teeth and Oropharynx as per pre-operative assessment

## 2021-11-01 NOTE — Transfer of Care (Signed)
Immediate Anesthesia Transfer of Care Note  Patient: Jessica Lynn  Procedure(s) Performed: DEBRIDEMENT RIGHT LEG, WOUND VAC CHANGE (Right: Leg Lower)  Patient Location: PACU  Anesthesia Type:General  Level of Consciousness: drowsy and patient cooperative  Airway & Oxygen Therapy: Patient Spontanous Breathing and Patient connected to nasal cannula oxygen  Post-op Assessment: Report given to RN and Post -op Vital signs reviewed and stable  Post vital signs: Reviewed and stable  Last Vitals:  Vitals Value Taken Time  BP 122/80 11/01/21 1358  Temp    Pulse 73 11/01/21 1359  Resp 12 11/01/21 1359  SpO2 100 % 11/01/21 1359  Vitals shown include unvalidated device data.  Last Pain:  Vitals:   11/01/21 1253  TempSrc: Oral  PainSc:       Patients Stated Pain Goal: 0 (80/99/83 3825)  Complications: No notable events documented.

## 2021-11-01 NOTE — Progress Notes (Signed)
PROGRESS NOTE    Jessica Lynn  MPN:[id number] DOB: 1961/03/28 DOA: 10/28/2021 PCP: Cassandria Anger, MD    Brief Narrative:  61 year old with history of chronic bilateral extremity ulcers, retinal vasculitis presented to the emergency room with abnormal kidney functions called by her rheumatologist.  Patient is taken care of at wound care clinic and also followed by rheumatologist.  Recently treated with Humira and subsequently with prednisone that was tapered off.  She went to rheumatologist yesterday who did a routine blood test and found to have AKI, labs unavailable with previous normal renal functions so was asked to go to emergency room.  Recently taken Augmentin.  Seen at wound care clinic few days ago. Initially in the emergency room she was noted to be hypotensive requiring 4 L of fluid, blood cultures were drawn and she was started on antibiotics. Patient was found to have a pocket of infection on the right leg, underwent incision debridement and wound VAC placement. Renal functions normalized.  She is revisit debridement today by Dr. Sharol Given.  Clinically improving.   Assessment & Plan:   Severe sepsis present on admission: Secondary to right leg abscess.    Presented with hypotension, lactic acidosis and WBC count of 12.2.  Currently on daptomycin.  Incision drainage and debridement of the skin subcutaneous tissue and fascia 7/26.  Redo surgery planned today. Surgical cultures are pending. Remains on daptomycin.  May go home with portable wound VAC and on oral antibiotics.  Acute kidney injury: Prerenal in the setting of sepsis and hypotension.   Aggressively resuscitated.  Kidney functions normalized.   Patient was on losartan hydrochlorothiazide as well as Lasix at home. Renal ultrasound essentially normal.  Urine output is adequate. Renal functions normalized.  Will avoid losartan hydrochlorothiazide.  Essential hypertension: Blood pressures low normal.  Hold all  antihypertensives.  Hypotension: Probably due to #2.  She was recently on prednisone and that was tapered off.  Do not expect adrenal insufficiency with normal cortisol levels. Blood pressures are mostly more than 100. Improving perfusion to the kidneys and no evidence of orthostatic blood pressure drop, monitor.  Hypomagnesemia: Replace further today.  Anemia: Acute on chronic anemia.  Probably dilutional as well as surgical blood loss.  Hemoglobin 6.6.   No evidence of active bleeding.   Patient consented for blood transfusion.  1 unit PRBC preop today.  Recheck levels tomorrow.     DVT prophylaxis: enoxaparin (LOVENOX) injection 40 mg Start: 10/31/21 1600 SCDs Start: 10/30/21 1954 SCDs Start: 10/28/21 2326   Code Status: Full code Family Communication: Daughter and husband at the bedside. Disposition Plan: Status is: Inpatient Remains inpatient appropriate because: Significant renal function abnormalities, leg wounds.  Inpatient surgical plans.     Consultants:  Infectious disease Wound care Orthopedics  Procedures:  None  Antimicrobials:  Daptomycin   Subjective:  Seen and examined.  Consented for blood transfusion after explaining risk versus benefits of blood transfusions.  Hemoglobin 6.6. Moderate pain persist on the leg but far better than before.  Denies any dizziness or lightheadedness on attempting to sit up or walk.  Daughter and husband at the bedside.  Objective: Vitals:   10/31/21 0456 10/31/21 0924 10/31/21 2111 11/01/21 0625  BP: (!) 91/58 110/77 108/73 100/73  Pulse: 79 80 98 78  Resp: '18 18 18 18  '$ Temp: 98.8 F (37.1 C) 98.4 F (36.9 C) 98.9 F (37.2 C) 98.6 F (37 C)  TempSrc: Oral Oral  Oral  SpO2: 98% 100% 100% 100%  Weight:      Height:        Intake/Output Summary (Last 24 hours) at 11/01/2021 0719 Last data filed at 10/31/2021 2201 Gross per 24 hour  Intake 520 ml  Output 850 ml  Net -330 ml    Filed Weights   10/29/21 2015  10/30/21 1216  Weight: 78.5 kg 78.5 kg    Examination:  General: Looks comfortable.  On room air.  Not in any distress. Cardiovascular: S1-S2 normal.  Regular rate rhythm. Respiratory: Bilateral clear.  No added sounds. Gastrointestinal: Soft.  Nontender.  Bowel sound present. Ext: Right leg lateral incision clean and dry, wound VAC fitted, serosanguineous drainage on canister.  Moderate swelling on the dorsum of the foot. Left foot with superficial punched-out ulcers, no fluctuation.  Difficult to see erythema because of skin color. Neuro: Intact.      Data Reviewed: I have personally reviewed following labs and imaging studies  CBC: Recent Labs  Lab 10/28/21 1628 10/28/21 1709 10/31/21 0330 11/01/21 0450  WBC 12.2*  --  8.2 9.9  NEUTROABS 8.5*  --  6.2 6.2  HGB 9.7* 9.9* 7.3* 6.6*  HCT 28.6* 29.0* 21.6* 19.4*  MCV 102.9*  --  102.9* 102.6*  PLT 193  --  156 027    Basic Metabolic Panel: Recent Labs  Lab 10/28/21 1628 10/28/21 1709 10/29/21 0032 10/30/21 0450 10/31/21 0330 11/01/21 0450  NA 133* 130* 133* 141 136 139  K 4.3 4.3 3.9 4.0 4.2 4.0  CL 99 98 101 113* 109 107  CO2 23  --  21* '23 23 22  '$ GLUCOSE 115* 108* 94 122* 145* 108*  BUN 40* 37* 36* '17 12 15  '$ CREATININE 2.98* 3.30* 2.14* 1.06* 0.93 0.96  CALCIUM 9.2  --  8.6* 8.0* 8.3* 8.6*  MG  --   --  1.7 1.3* 1.8 1.6*  PHOS  --   --  3.8 3.1  --   --     GFR: Estimated Creatinine Clearance: 65.3 mL/min (by C-G formula based on SCr of 0.96 mg/dL). Liver Function Tests: Recent Labs  Lab 10/28/21 1814 10/29/21 0032  AST 31 24  ALT 32 24  ALKPHOS 78 63  BILITOT 0.9 0.9  PROT 6.4* 4.8*  ALBUMIN 3.1* 2.4*    No results for input(s): "LIPASE", "AMYLASE" in the last 168 hours. No results for input(s): "AMMONIA" in the last 168 hours. Coagulation Profile: Recent Labs  Lab 10/28/21 1814  INR 1.1    Cardiac Enzymes: Recent Labs  Lab 10/29/21 0032 10/30/21 0450  CKTOTAL 110 63    BNP  (last 3 results) No results for input(s): "PROBNP" in the last 8760 hours. HbA1C: No results for input(s): "HGBA1C" in the last 72 hours. CBG: No results for input(s): "GLUCAP" in the last 168 hours. Lipid Profile: No results for input(s): "CHOL", "HDL", "LDLCALC", "TRIG", "CHOLHDL", "LDLDIRECT" in the last 72 hours. Thyroid Function Tests: No results for input(s): "TSH", "T4TOTAL", "FREET4", "T3FREE", "THYROIDAB" in the last 72 hours. Anemia Panel: No results for input(s): "VITAMINB12", "FOLATE", "FERRITIN", "TIBC", "IRON", "RETICCTPCT" in the last 72 hours.  Sepsis Labs: Recent Labs  Lab 10/29/21 0032 10/29/21 0414 10/29/21 2300 10/30/21 0045  PROCALCITON <0.10  --   --   --   LATICACIDVEN 1.3 1.0 1.4 1.8     Recent Results (from the past 240 hour(s))  Blood Culture (routine x 2)     Status: None (Preliminary result)   Collection Time: 10/28/21  6:45 PM  Specimen: Right Antecubital; Blood  Result Value Ref Range Status   Specimen Description RIGHT ANTECUBITAL  Final   Special Requests   Final    BOTTLES DRAWN AEROBIC AND ANAEROBIC Blood Culture adequate volume   Culture   Final    NO GROWTH 3 DAYS Performed at [facility], [address] [place], [place] [address]    Report Status PENDING  Incomplete  MRSA Next Gen by PCR, Nasal     Status: None   Collection Time: 10/28/21 11:11 PM   Specimen: Nasal Mucosa; Nasal Swab  Result Value Ref Range Status   MRSA by PCR Next Gen NOT DETECTED NOT DETECTED Final    Comment: (NOTE) The GeneXpert MRSA Assay (FDA approved for NASAL specimens only), is one component of a comprehensive MRSA colonization surveillance program. It is not intended to diagnose MRSA infection nor to guide or monitor treatment for MRSA infections. Test performance is not FDA approved in patients less than 13 years old. Performed at [facility], [place] [address], [place], [place] [address]   Aerobic/Anaerobic Culture w Gram Stain  (surgical/deep wound)     Status: None (Preliminary result)   Collection Time: 10/30/21  3:15 PM   Specimen: PATH Soft tissue  Result Value Ref Range Status   Specimen Description TISSUE RIGHT LEG  Final   Special Requests SAMPLE A PROXIMAL  Final   Gram Stain   Final    ABUNDANT WBC PRESENT, PREDOMINANTLY PMN MODERATE GRAM POSITIVE COCCI IN CLUSTERS Performed at [facility], [place] [address], [place], [place] [address]    Culture RARE STAPHYLOCOCCUS AUREUS  Final   Report Status PENDING  Incomplete  Aerobic/Anaerobic Culture w Gram Stain (surgical/deep wound)     Status: None (Preliminary result)   Collection Time: 10/30/21  3:19 PM   Specimen: PATH Soft tissue  Result Value Ref Range Status   Specimen Description TISSUE RIGHT LEG  Final   Special Requests SAMPLE B DISTAL  Final   Gram Stain   Final    ABUNDANT WBC PRESENT, PREDOMINANTLY PMN RARE GRAM POSITIVE COCCI Performed at [facility], [place] [address] [place], [place] [address]    Culture RARE STAPHYLOCOCCUS AUREUS  Final   Report Status PENDING  Incomplete         Radiology Studies: VAS [place] ABI WITH/WO TBI  Result Date: 10/31/2021  LOWER EXTREMITY DOPPLER STUDY Patient Name:  Jessica Lynn  Date of Exam:   10/31/2021 Medical Rec #: [id number]       Accession #:    [id number] Date of Birth: 1960/10/31       Patient Gender: F Patient Age:   25 years Exam Location:  [facility] Procedure:      VAS [place] ABI WITH/WO TBI Referring Phys: MARCUS DUDA --------------------------------------------------------------------------------  Indications: Ulceration, and Sepsis, necrotizing fasciitis. High Risk Factors: Hypertension, hyperlipidemia.  Comparison Study: No prior study Performing Technologist: Sharion Dove RVS  Examination Guidelines: A complete evaluation includes at minimum, Doppler waveform signals and systolic blood pressure reading at the level of bilateral brachial, anterior tibial, and posterior tibial  arteries, when vessel segments are accessible. Bilateral testing is considered an integral part of a complete examination. Photoelectric Plethysmograph (PPG) waveforms and toe systolic pressure readings are included as required and additional duplex testing as needed. Limited examinations for reoccurring indications may be performed as noted.  ABI Findings: +---------+------------------+-----+-----------+--------+ Right    Rt Pressure (mmHg)IndexWaveform   Comment  +---------+------------------+-----+-----------+--------+  Brachial 118                    triphasic           +---------+------------------+-----+-----------+--------+ PTA      118               1.00 multiphasic         +---------+------------------+-----+-----------+--------+ DP       144               1.22 multiphasic         +---------+------------------+-----+-----------+--------+ Great Toe71                0.60 Normal              +---------+------------------+-----+-----------+--------+ +---------+------------------+-----+-----------+-------+ Left     Lt Pressure (mmHg)IndexWaveform   Comment +---------+------------------+-----+-----------+-------+ Brachial 109                    triphasic          +---------+------------------+-----+-----------+-------+ PTA      127               1.08 multiphasic        +---------+------------------+-----+-----------+-------+ DP       132               1.12 multiphasic        +---------+------------------+-----+-----------+-------+ Great Toe92                0.78 Normal             +---------+------------------+-----+-----------+-------+ +-------+-----------+-----------+------------+------------+ ABI/TBIToday's ABIToday's TBIPrevious ABIPrevious TBI +-------+-----------+-----------+------------+------------+ Right  1.2                                            +-------+-----------+-----------+------------+------------+ Left   1.1                                             +-------+-----------+-----------+------------+------------+  Summary: Right: Resting right ankle-brachial index is within normal range. No evidence of significant right lower extremity arterial disease. The right toe-brachial index is abnormal. Left: Resting left ankle-brachial index is within normal range. No evidence of significant left lower extremity arterial disease. The left toe-brachial index is normal. *See table(s) above for measurements and observations.  Electronically signed by Orlie Pollen on 10/31/2021 at 3:35:31 PM.    Final         Scheduled Meds:  (feeding supplement) PROSource Plus  30 mL Oral TID BM   sodium chloride   Intravenous Once   vitamin C  500 mg Oral BID   chlorhexidine  60 mL Topical Once   collagenase   Topical Daily   docusate sodium  100 mg Oral BID   dorzolamide-timolol  1 drop Both Eyes BID   enoxaparin (LOVENOX) injection  40 mg Subcutaneous Q24H   montelukast  10 mg Oral QHS   multivitamin with minerals  1 tablet Oral Daily   nutrition supplement (JUVEN)  1 packet Oral BID BM   prednisoLONE acetate  1 drop Both Eyes QID   zinc sulfate  220 mg Oral Daily   Continuous Infusions:  sodium chloride Stopped (10/30/21 2301)   DAPTOmycin (CUBICIN) 500 mg in sodium chloride 0.9 % IVPB 500 mg (10/31/21  1957)   lactated ringers Stopped (10/30/21 1946)   magnesium sulfate bolus IVPB     methocarbamol (ROBAXIN) IV       LOS: 4 days    Time spent: 35 minutes    Barb Merino, MD [facility] Pager [phone]

## 2021-11-01 NOTE — Op Note (Signed)
11/01/2021  2:02 PM  PATIENT:  Jessica Lynn    PRE-OPERATIVE DIAGNOSIS:  Necrotizing Fascitis Right Leg  POST-OPERATIVE DIAGNOSIS:  Same  PROCEDURE:  DEBRIDEMENT RIGHT LEG, WOUND VAC CHANGE Local tissue rearrangement for wound closure 30 x 10 cm. Application of Kerecis micro tissue graft 95 cm.   SURGEON:  [name], MD  PHYSICIAN ASSISTANT:None ANESTHESIA:   General  PREOPERATIVE INDICATIONS:  [name] is a  61 y.o. female with a diagnosis of Necrotizing Fascitis Right Leg who failed conservative measures and elected for surgical management.    The risks benefits and alternatives were discussed with the patient preoperatively including but not limited to the risks of infection, bleeding, nerve injury, cardiopulmonary complications, the need for revision surgery, among others, and the patient was willing to proceed.  OPERATIVE IMPLANTS: Kerecis micro powder 95 cm.  '@ENCIMAGES'$ @  OPERATIVE FINDINGS: Tissue margins were clear muscle had good color and contractility.  OPERATIVE PROCEDURE: Patient was brought the operating room and underwent a general anesthetic.  After adequate levels anesthesia were obtained patient's right lower extremity was prepped using Betadine paint draped into a sterile field a timeout was called.  Patient had some remaining nonviable muscle that was excised the remaining muscle was contractile with good color and consistency.  More fascia was excised.  Tissue edges were undermined to allow for local tissue rearrangement.  The wound was irrigated with normal saline.  The 95 cm micro graft was applied to the wound bed.  Local tissue rearrangement was used to close the wound with 2-0 nylon sutures.  Closure was 30 x 10 cm area.  Wound edges were well approximated.  The customizable Prevena sponge was applied covered with derma tack and Coban this had a good suction fit patient was extubated taken the PACU in stable condition.   DISCHARGE  PLANNING:  Antibiotic duration: Continue IV antibiotics , anticipate discharge on oral antibiotics.  The staph aureus is essentially pansensitive.  Weightbearing: Weightbearing as tolerated  Pain medication: Continue opioid pathway  Dressing care/ Wound VAC: Continue wound VAC anticipate discharge with the wound VAC with the Praveena portable pump therapy  Ambulatory devices: Walker  Discharge to: Discharge planning based on therapy recommendations.  Follow-up: In the office 1 week post operative.

## 2021-11-01 NOTE — Progress Notes (Signed)
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

## 2021-11-01 NOTE — Progress Notes (Signed)
Physical Therapy Treatment Patient Details Name: Jessica Lynn MRN: [id number] DOB: Aug 15, 1960 Today's Date: 11/01/2021   History of Present Illness Pt is a 61 y/o female admitted 7/24 secondary to increased swelling in RLE and chronic wounds on BLE. Thought to be secondary to cellulitis; workup pending. PMH includes HTN and GERD.    PT Comments    Pt sitting up on EoB finishing up wash up, requesting to go to [facility]. Pt requires minAx2 for power up and stepping transfer to and from [facility]. Pt frustrated with use of Purewick and leaking. PT set up room so that she can safely transfer to and from [facility]. Husband educated in Economist for transfers. Pt to go back to surgery for further I&D of R LE wound. PT will follow back to reassess mobility after surgery. D/c plans remain appropriate at this time.    Recommendations for follow up therapy are one component of a multi-disciplinary discharge planning process, led by the attending physician.  Recommendations may be updated based on patient status, additional functional criteria and insurance authorization.  Follow Up Recommendations  Acute inpatient rehab (3hours/day) (vs HHPT pending progression)     Assistance Recommended at Discharge Intermittent Supervision/Assistance  Patient can return home with the following A lot of help with walking and/or transfers;A lot of help with bathing/dressing/bathroom;Assistance with cooking/housework;Help with stairs or ramp for entrance;Assist for transportation   Equipment Recommendations  Rolling walker (2 wheels);BSC/3in1       Precautions / Restrictions Precautions Precautions: Fall Restrictions Weight Bearing Restrictions: Yes RLE Weight Bearing: Weight bearing as tolerated     Mobility  Bed Mobility               General bed mobility comments: sitting EoB on entry washing up    Transfers Overall transfer level: Needs assistance Equipment used: Rolling walker (2 wheels) Transfers:  Sit to/from Stand, Bed to chair/wheelchair/BSC Sit to Stand: Min assist, +2 safety/equipment   Step pivot transfers: Min assist, +2 safety/equipment       General transfer comment: requires maximal cuing for sequencing to power up for hand placement and education on weightbearing, once sequence determined pt require min Ax2 for power up and steadying with stepping transfer to and from [facility]    Ambulation/Gait               General Gait Details: deferred due to pain       Balance Overall balance assessment: Needs assistance Sitting-balance support: No upper extremity supported, Feet supported Sitting balance-[name]: Fair     Standing balance support: Bilateral upper extremity supported Standing balance-[name]: Poor Standing balance comment: Reliant on UE and external support                            Cognition Arousal/Alertness: Awake/alert Behavior During Therapy: WFL for tasks assessed/performed Overall Cognitive Status: Within Functional Limits for tasks assessed                                             General Comments General comments (skin integrity, edema, etc.): VSS on RA, pt despises Purewick and use of bedpan, set up room so that she can transfer from bed to [facility] with assistance of family, family educated on proper line management for transfer to [facility]      Pertinent Vitals/Pain Pain  Assessment Pain Assessment: Faces Faces Pain Scale: Hurts little more Pain Location: LLE Pain Descriptors / Indicators: Sharp, Throbbing, Sore Pain Intervention(s): Limited activity within patient's tolerance, Monitored during session, Repositioned     PT Goals (current goals can now be found in the care plan section) Acute Rehab PT Goals Patient Stated Goal: to decrease pain PT Goal Formulation: With patient Time For Goal Achievement: 11/12/21 Potential to Achieve Goals: Good Progress towards PT goals: Progressing toward goals     Frequency    Min 3X/week      PT Plan Current plan remains appropriate       AM-PAC PT "6 Clicks" Mobility   Outcome Measure  Help needed turning from your back to your side while in a flat bed without using bedrails?: A Little Help needed moving from lying on your back to sitting on the side of a flat bed without using bedrails?: A Little Help needed moving to and from a bed to a chair (including a wheelchair)?: A Lot Help needed standing up from a chair using your arms (e.g., wheelchair or bedside chair)?: A Lot Help needed to walk in hospital room?: A Lot Help needed climbing 3-5 steps with a railing? : Total 6 Click Score: 13    End of Session Equipment Utilized During Treatment: Gait belt Activity Tolerance: Patient limited by pain Patient left: in bed;with call bell/phone within reach;with family/visitor present;Other (comment) (set up EoB to brush teeth) Nurse Communication: Mobility status PT Visit Diagnosis: Other abnormalities of gait and mobility (R26.89);Difficulty in walking, not elsewhere classified (R26.2);Pain Pain - Right/Left: Right Pain - part of body: Leg     Time: 0911-0932 PT Time Calculation (min) (ACUTE ONLY): 21 min  Charges:  $Therapeutic Activity: 8-22 mins                     Aydien Majette B. Migdalia Dk PT, DPT Acute Rehabilitation Services Please use secure chat or  Call Office [phone]    [place] 11/01/2021, 9:51 AM

## 2021-11-01 NOTE — Anesthesia Preprocedure Evaluation (Signed)
Anesthesia Evaluation  Patient identified by MRN, date of birth, ID band Patient awake    Reviewed: Allergy & Precautions, NPO status , Patient's Chart, lab work & pertinent test results  History of Anesthesia Complications Negative for: history of anesthetic complications  Airway Mallampati: II  TM Distance: >3 FB Neck ROM: Full    Dental  (+) Edentulous Upper, Dental Advisory Given   Pulmonary neg pulmonary ROS, former smoker,    Pulmonary exam normal        Cardiovascular hypertension, Normal cardiovascular exam     Neuro/Psych negative neurological ROS     GI/Hepatic Neg liver ROS, GERD  ,  Endo/Other  negative endocrine ROS  Renal/GU negative Renal ROS  negative genitourinary   Musculoskeletal negative musculoskeletal ROS (+)   Abdominal   Peds  Hematology  (+) Blood dyscrasia, anemia ,   Anesthesia Other Findings  Necrotizing fasciitis right leg  Reproductive/Obstetrics                             Anesthesia Physical  Anesthesia Plan  ASA: 3  Anesthesia Plan: General   Post-op Pain Management: Tylenol PO (pre-op)*   Induction: Intravenous  PONV Risk Score and Plan: 3 and Ondansetron, Dexamethasone, Midazolam and Treatment may vary due to age or medical condition  Airway Management Planned: LMA  Additional Equipment: None  Intra-op Plan:   Post-operative Plan: Extubation in OR  Informed Consent: I have reviewed the patients History and Physical, chart, labs and discussed the procedure including the risks, benefits and alternatives for the proposed anesthesia with the patient or authorized representative who has indicated his/her understanding and acceptance.     Dental advisory given  Plan Discussed with:   Anesthesia Plan Comments:         Anesthesia Quick Evaluation

## 2021-11-01 NOTE — Interval H&P Note (Signed)
History and Physical Interval Note:  11/01/2021 6:39 AM  Jessica Lynn  has presented today for surgery, with the diagnosis of Necrotizing Fascitis Right Leg.  The various methods of treatment have been discussed with the patient and family. After consideration of risks, benefits and other options for treatment, the patient has consented to  Procedure(s): DEBRIDEMENT RIGHT LEG (Right) as a surgical intervention.  The patient's history has been reviewed, patient examined, no change in status, stable for surgery.  I have reviewed the patient's chart and labs.  Questions were answered to the patient's satisfaction.     [name]

## 2021-11-02 ENCOUNTER — Other Ambulatory Visit: Payer: Self-pay

## 2021-11-02 DIAGNOSIS — A419 Sepsis, unspecified organism: Secondary | ICD-10-CM | POA: Diagnosis not present

## 2021-11-02 DIAGNOSIS — R6520 Severe sepsis without septic shock: Secondary | ICD-10-CM | POA: Diagnosis not present

## 2021-11-02 DIAGNOSIS — N170 Acute kidney failure with tubular necrosis: Secondary | ICD-10-CM | POA: Diagnosis not present

## 2021-11-02 LAB — BPAM RBC
Blood Product Expiration Date: 202308102359
ISSUE DATE / TIME: 202307280952
Unit Type and Rh: 6200

## 2021-11-02 LAB — CBC WITH DIFFERENTIAL/PLATELET
Abs Immature Granulocytes: 0.13 10*3/uL — ABNORMAL HIGH (ref 0.00–0.07)
Basophils Absolute: 0 10*3/uL (ref 0.0–0.1)
Basophils Relative: 0 %
Eosinophils Absolute: 0 10*3/uL (ref 0.0–0.5)
Eosinophils Relative: 0 %
HCT: 22.3 % — ABNORMAL LOW (ref 36.0–46.0)
Hemoglobin: 7.5 g/dL — ABNORMAL LOW (ref 12.0–15.0)
Immature Granulocytes: 1 %
Lymphocytes Relative: 22 %
Lymphs Abs: 2.2 10*3/uL (ref 0.7–4.0)
MCH: 33.9 pg (ref 26.0–34.0)
MCHC: 33.6 g/dL (ref 30.0–36.0)
MCV: 100.9 fL — ABNORMAL HIGH (ref 80.0–100.0)
Monocytes Absolute: 0.7 10*3/uL (ref 0.1–1.0)
Monocytes Relative: 7 %
Neutro Abs: 7.1 10*3/uL (ref 1.7–7.7)
Neutrophils Relative %: 70 %
Platelets: 190 10*3/uL (ref 150–400)
RBC: 2.21 MIL/uL — ABNORMAL LOW (ref 3.87–5.11)
RDW: 15.9 % — ABNORMAL HIGH (ref 11.5–15.5)
WBC: 10.2 10*3/uL (ref 4.0–10.5)
nRBC: 0.4 % — ABNORMAL HIGH (ref 0.0–0.2)

## 2021-11-02 LAB — BASIC METABOLIC PANEL
Anion gap: 5 (ref 5–15)
BUN: 19 mg/dL (ref 6–20)
CO2: 23 mmol/L (ref 22–32)
Calcium: 8.3 mg/dL — ABNORMAL LOW (ref 8.9–10.3)
Chloride: 111 mmol/L (ref 98–111)
Creatinine, Ser: 0.89 mg/dL (ref 0.44–1.00)
GFR, Estimated: >60 mL/min (ref >60)
Glucose, Bld: 121 mg/dL — ABNORMAL HIGH (ref 70–99)
Potassium: 4.3 mmol/L (ref 3.5–5.1)
Sodium: 139 mmol/L (ref 135–145)

## 2021-11-02 LAB — TYPE AND SCREEN
ABO/RH(D): A POS
Antibody Screen: NEGATIVE
Unit division: 0

## 2021-11-02 LAB — CULTURE, BLOOD (ROUTINE X 2)
Culture: NO GROWTH
Special Requests: ADEQUATE

## 2021-11-02 LAB — MAGNESIUM: Magnesium: 1.5 mg/dL — ABNORMAL LOW (ref 1.7–2.4)

## 2021-11-02 LAB — VITAMIN A: Vitamin A (Retinoic Acid): 18.4 ug/dL — ABNORMAL LOW (ref 20.1–62.0)

## 2021-11-02 MED ORDER — MAGNESIUM SULFATE 4 GM/100ML IV SOLN
4.0000 g | Freq: Once | INTRAVENOUS | Status: AC
Start: 2021-11-02 — End: 2021-11-02
  Administered 2021-11-02: 4 g via INTRAVENOUS
  Filled 2021-11-02: qty 100

## 2021-11-02 NOTE — Progress Notes (Signed)
Progress Note   Patient: Jessica Lynn DOB: [birth year] DOA: 10/28/2021     5 DOS: the patient was seen and examined on 11/02/2021   Brief hospital course: 61 year old with history of chronic bilateral extremity ulcers, retinal vasculitis presented to the emergency room with abnormal kidney functions called by her rheumatologist.  Patient is taken care of at wound care clinic and also followed by rheumatologist.  Recently treated with Humira and subsequently with prednisone that was tapered off.  She went to rheumatologist yesterday who did a routine blood test and found to have AKI, labs unavailable with previous normal renal functions so was asked to go to emergency room.  Recently taken Augmentin.  Seen at wound care clinic few days ago. Initially in the emergency room she was noted to be hypotensive requiring 4 L of fluid, blood cultures were drawn and she was started on antibiotics. Patient was found to have a pocket of infection on the right leg, underwent incision debridement and wound VAC placement. Renal functions normalized.  She is revisit debridement today by Dr. Sharol Given.  Clinically improving.    Assessment and Plan: Severe sepsis present on admission: Secondary to right leg abscess.    Presented with hypotension, lactic acidosis and WBC count of 12.2.  Was on daptomycin.  Incision drainage and debridement of the skin subcutaneous tissue and fascia 7/26.  Redo surgery performed 7/28. Per ID, transitioned to cefazolin through weekend then transition to cefadroxil x 2 more weeks starting 7/31 to 8/14 Anticipate home with portable wound VAC and on oral antibiotics.   Acute kidney injury: Prerenal in the setting of sepsis and hypotension.   Aggressively resuscitated.  Kidney functions normalized.   Patient was on losartan hydrochlorothiazide as well as Lasix at home. Renal ultrasound essentially normal.  Urine output is adequate. Renal functions normalized.  Will avoid  losartan hydrochlorothiazide.  Essential hypertension: Blood pressures low normal.  Hold all antihypertensives.   Hypotension: Probably due to #2.  She was recently on prednisone and that was tapered off.  Do not expect adrenal insufficiency with normal cortisol levels. Blood pressures are mostly more than 100. Improving perfusion to the kidneys and no evidence of orthostatic blood pressure drop, monitor.   Hypomagnesemia: Replace further today.   Anemia: Acute on chronic anemia.  Probably dilutional as well as surgical blood loss. S/p 1 unit PRBC Hgb seems stable Repeat cbc in AM.       Subjective: Eager to go home soon  Physical Exam: Vitals:   11/01/21 2050 11/02/21 0300 11/02/21 0500 11/02/21 0821  BP: '91/65 97/66 92/67 '$ 105/69  Pulse: 93 84 77 83  Resp: '16  16 15  '$ Temp: 98.3 F (36.8 C)  98.3 F (36.8 C) 98.3 F (36.8 C)  TempSrc: Oral  Oral Oral  SpO2: 100%  100% 100%  Weight:      Height:       General exam: Awake, laying in bed, in nad Respiratory system: Normal respiratory effort, no wheezing Cardiovascular system: regular rate, s1, s2 Gastrointestinal system: Soft, nondistended, positive BS Central nervous system: CN2-12 grossly intact, strength intact Extremities: Perfused, no clubbing, BLE with dressings in place Skin: Normal skin turgor, no notable skin lesions seen Psychiatry: Mood normal // no visual hallucinations   Data Reviewed:  Labs reviewed: 139, K 4.3, Cr. 0.89  Family Communication: Pt in room, family at bedside  Disposition: Status is: Inpatient Remains inpatient appropriate because: Severity of illness  Planned Discharge Destination: Home  Author: Marylu Lund, MD 11/02/2021 3:40 PM  For on call review [email or web address].

## 2021-11-02 NOTE — Progress Notes (Signed)
Patient ID: Jessica Lynn, female   DOB: [birth year], 61 y.o.   MRN: [id number] Patient is postoperative day 1 repeat debridement application of Kerecis micro powder for necrotizing fasciitis right leg.  There is 50 cc in the wound VAC canister with a good suction fit.  Cultures show pansensitive staph aureus.  Anticipate patient can be discharged with the portable Praveena wound VAC pump,  on an oral antibiotic.

## 2021-11-02 NOTE — Progress Notes (Signed)
Physical Therapy Treatment Patient Details Name: Jessica Lynn MRN: [id number] DOB: 1960-04-28 Today's Date: 11/02/2021   History of Present Illness 61 y.o. female presented to ED 7/24 with worsening LE pain/swelling and ulcers caused by hives that eventually ulcerated. Work-up (+) for cellulitis, sepsis, hyponatremia and AKI. S/p R LE I&D on 7/26. S/p R LE wound closure and wound vac change on 7/28. PMHx significant for chronic BLE ulcers followed by outpatient wound care, Hx of retinal vasculitis, DLD and HTN.    PT Comments    Patient s/p repeat I&D with wound closure on R LE. Patient able to progress ambulation distance with RW and min guard. Continues to complain of R LE pain during WB. Encouraged continued mobility to improve pain tolerance and WB. D/c plan updated to home with HHPT.     Recommendations for follow up therapy are one component of a multi-disciplinary discharge planning process, led by the attending physician.  Recommendations may be updated based on patient status, additional functional criteria and insurance authorization.  Follow Up Recommendations  Home health PT     Assistance Recommended at Discharge Intermittent Supervision/Assistance  Patient can return home with the following Assistance with cooking/housework;Help with stairs or ramp for entrance;Assist for transportation;A little help with walking and/or transfers;A little help with bathing/dressing/bathroom   Equipment Recommendations  Rolling Tyquasia Pant (2 wheels);BSC/3in1    Recommendations for Other Services       Precautions / Restrictions Precautions Precautions: Fall Restrictions Weight Bearing Restrictions: Yes RLE Weight Bearing: Weight bearing as tolerated     Mobility  Bed Mobility               General bed mobility comments: sitting EOB on arrival    Transfers Overall transfer level: Needs assistance Equipment used: Rolling Edell Mesenbrink (2 wheels) Transfers: Sit to/from Stand Sit to  Stand: Mod assist           General transfer comment: modA for boost up into standing. Cues for hand placement    Ambulation/Gait Ambulation/Gait assistance: Min guard Gait Distance (Feet): 60 Feet Assistive device: Rolling Jennier Schissler (2 wheels) Gait Pattern/deviations: Decreased stride length, Decreased stance time - right, Step-to pattern Gait velocity: decreased     General Gait Details: min guard for safety. Decreased stance time on R LE   Stairs             Wheelchair Mobility    Modified Rankin (Stroke Patients Only)       Balance Overall balance assessment: Needs assistance Sitting-balance support: No upper extremity supported, Feet supported Sitting balance-[name]: Fair     Standing balance support: Bilateral upper extremity supported Standing balance-[name]: Poor                              Cognition Arousal/Alertness: Awake/alert Behavior During Therapy: WFL for tasks assessed/performed Overall Cognitive Status: Within Functional Limits for tasks assessed                                          Exercises      General Comments        Pertinent Vitals/Pain Pain Assessment Pain Assessment: Faces Faces Pain Scale: Hurts little more Pain Location: R LE Pain Descriptors / Indicators: Sharp, Throbbing, Sore Pain Intervention(s): Premedicated before session, Monitored during session, Repositioned, Limited activity within patient's tolerance  Home Living                          Prior Function            PT Goals (current goals can now be found in the care plan section) Acute Rehab PT Goals Patient Stated Goal: to go home PT Goal Formulation: With patient Time For Goal Achievement: 11/12/21 Potential to Achieve Goals: Good Progress towards PT goals: Progressing toward goals    Frequency    Min 3X/week      PT Plan Frequency needs to be updated    Co-evaluation               AM-PAC PT "6 Clicks" Mobility   Outcome Measure  Help needed turning from your back to your side while in a flat bed without using bedrails?: A Little Help needed moving from lying on your back to sitting on the side of a flat bed without using bedrails?: A Little Help needed moving to and from a bed to a chair (including a wheelchair)?: A Lot Help needed standing up from a chair using your arms (e.g., wheelchair or bedside chair)?: A Lot Help needed to walk in hospital room?: A Little Help needed climbing 3-5 steps with a railing? : Total 6 Click Score: 14    End of Session Equipment Utilized During Treatment: Gait belt Activity Tolerance: Patient tolerated treatment well Patient left: in chair;with call bell/phone within reach;with family/visitor present Nurse Communication: Mobility status PT Visit Diagnosis: Other abnormalities of gait and mobility (R26.89);Difficulty in walking, not elsewhere classified (R26.2);Pain Pain - Right/Left: Right Pain - part of body: Leg     Time: 1110-1141 PT Time Calculation (min) (ACUTE ONLY): 31 min  Charges:  $Gait Training: 8-22 mins                     [name] A. Gilford Rile PT, DPT Acute Rehabilitation Services Office [phone]    [name] 11/02/2021, 1:36 PM

## 2021-11-02 NOTE — Evaluation (Signed)
Occupational Therapy Evaluation Patient Details Name: Jessica Lynn MRN: [id number] DOB: 1960-08-25 Today's Date: 11/02/2021   History of Present Illness 61 y.o. female presented to ED 7/24 with worsening LE pain/swelling and ulcers caused by hives that eventually ulcerated. Work-up (+) for cellulitis, sepsis, hyponatremia and AKI. S/p R LE I&D on 7/26. S/p R LE wound closure and wound vac change on 7/28. PMHx significant for chronic BLE ulcers followed by outpatient wound care, Hx of retinal vasculitis, DLD and HTN.   Clinical Impression   Pt making good progress with OT goals. This session she completed increased functional mobility and increased activity tolerance standing for ADL's. She requires mod A for safety, along with increased time due to pain in LLE. Updated discharge recommendations for [place], as pt requiring less assist and daughter reporting that she will be home and able to provide adequate support for pt. OT will continue to follow acutely.       Recommendations for follow up therapy are one component of a multi-disciplinary discharge planning process, led by the attending physician.  Recommendations may be updated based on patient status, additional functional criteria and insurance authorization.   Follow Up Recommendations  Home health OT    Assistance Recommended at Discharge Frequent or constant Supervision/Assistance  Patient can return home with the following A lot of help with walking and/or transfers;A lot of help with bathing/dressing/bathroom;Assistance with cooking/housework;Assist for transportation;Help with stairs or ramp for entrance    Functional Status Assessment     Equipment Recommendations  Other (comment);BSC/3in1 (RW)    Recommendations for Other Services       Precautions / Restrictions Precautions Precautions: Fall Restrictions Weight Bearing Restrictions: Yes RLE Weight Bearing: Weight bearing as tolerated      Mobility Bed Mobility                General bed mobility comments: sitting EOB on arrival    Transfers Overall transfer level: Needs assistance Equipment used: Rolling walker (2 wheels) Transfers: Sit to/from Stand Sit to Stand: Mod assist           General transfer comment: modA for boost up into standing. Cues for hand placement      Balance Overall balance assessment: Needs assistance Sitting-balance support: No upper extremity supported, Feet supported Sitting balance-[name]: Fair     Standing balance support: Bilateral upper extremity supported Standing balance-[name]: Poor                             ADL either performed or assessed with clinical judgement   ADL Overall ADL's : Needs assistance/impaired     Grooming: Wash/dry hands;Min Dispensing optician: Moderate assistance;Ambulation Toilet Transfer Details (indicate cue type and reason): increased time Toileting- Clothing Manipulation and Hygiene: Moderate assistance;Sitting/lateral lean;Sit to/from stand Toileting - Clothing Manipulation Details (indicate cue type and reason): Neds mod A to steady       General ADL Comments: Pt limited by pain and weakness. Requires increased time for safsety     Vision         Perception     Praxis      Pertinent Vitals/Pain Pain Assessment Pain Assessment: Faces Faces Pain Scale: Hurts little more Pain Location: R LE Pain Descriptors / Indicators: Sharp, Throbbing, Sore Pain Intervention(s): Premedicated before session     Hand Dominance  Extremity/Trunk Assessment             Communication     Cognition Arousal/Alertness: Awake/alert Behavior During Therapy: WFL for tasks assessed/performed Overall Cognitive Status: Within Functional Limits for tasks assessed                                       General Comments  VSS on RA    Exercises     Shoulder Instructions      Home  Living                                          Prior Functioning/Environment                          OT Problem List:        OT Treatment/Interventions:      OT Goals(Current goals can be found in the care plan section) Acute Rehab OT Goals Patient Stated Goal: To go home OT Goal Formulation: With patient Time For Goal Achievement: 11/12/21 Potential to Achieve Goals: Good ADL Goals Pt Will Perform Grooming: with modified independence;standing Pt Will Perform Lower Body Dressing: with modified independence;sit to/from stand;with adaptive equipment Pt Will Transfer to Toilet: with modified independence;ambulating Additional ADL Goal #1: Pt to increase standing activity tolerance > 10 min during ADLs/IADL tasks to improve overall endurance  OT Frequency: Min 2X/week    Co-evaluation              AM-PAC OT "6 Clicks" Daily Activity     Outcome Measure Help from another person eating meals?: None Help from another person taking care of personal grooming?: A Little Help from another person toileting, which includes using toliet, bedpan, or urinal?: A Lot Help from another person bathing (including washing, rinsing, drying)?: A Lot Help from another person to put on and taking off regular upper body clothing?: A Little Help from another person to put on and taking off regular lower body clothing?: A Lot 6 Click Score: 16   End of Session Equipment Utilized During Treatment: Gait belt;Rolling walker (2 wheels) Nurse Communication: Mobility status  Activity Tolerance: Patient tolerated treatment well Patient left: in bed;with call bell/phone within reach  OT Visit Diagnosis: Unsteadiness on feet (R26.81);Pain Pain - Right/Left: Right Pain - part of body: Leg                Time: 1110-1141 OT Time Calculation (min): 31 min Charges:  OT General Charges $OT Visit: 1 Visit OT Treatments $Therapeutic Activity: 8-22 mins  Dunbar Buras H., OTR/L Acute  Rehabilitation  Alberta Lenhard Elane Bernadine Melecio 11/02/2021, 6:45 PM

## 2021-11-02 NOTE — Hospital Course (Signed)
61 year old with history of chronic bilateral extremity ulcers, retinal vasculitis presented to the emergency room with abnormal kidney functions called by her rheumatologist.  Patient is taken care of at wound care clinic and also followed by rheumatologist.  Recently treated with Humira and subsequently with prednisone that was tapered off.  She went to rheumatologist yesterday who did a routine blood test and found to have AKI, labs unavailable with previous normal renal functions so was asked to go to emergency room.  Recently taken Augmentin.  Seen at wound care clinic few days ago. Initially in the emergency room she was noted to be hypotensive requiring 4 L of fluid, blood cultures were drawn and she was started on antibiotics. Patient was found to have a pocket of infection on the right leg, underwent incision debridement and wound VAC placement. Renal functions normalized.  She is revisit debridement today by Dr. Sharol Given.  Clinically improving.

## 2021-11-03 DIAGNOSIS — A419 Sepsis, unspecified organism: Secondary | ICD-10-CM | POA: Diagnosis not present

## 2021-11-03 DIAGNOSIS — R6520 Severe sepsis without septic shock: Secondary | ICD-10-CM | POA: Diagnosis not present

## 2021-11-03 DIAGNOSIS — N170 Acute kidney failure with tubular necrosis: Secondary | ICD-10-CM | POA: Diagnosis not present

## 2021-11-03 DIAGNOSIS — N179 Acute kidney failure, unspecified: Secondary | ICD-10-CM | POA: Diagnosis not present

## 2021-11-03 LAB — CBC
HCT: 21.5 % — ABNORMAL LOW (ref 36.0–46.0)
Hemoglobin: 7.3 g/dL — ABNORMAL LOW (ref 12.0–15.0)
MCH: 34 pg (ref 26.0–34.0)
MCHC: 34 g/dL (ref 30.0–36.0)
MCV: 100 fL (ref 80.0–100.0)
Platelets: 197 10*3/uL (ref 150–400)
RBC: 2.15 MIL/uL — ABNORMAL LOW (ref 3.87–5.11)
RDW: 15.7 % — ABNORMAL HIGH (ref 11.5–15.5)
WBC: 10 10*3/uL (ref 4.0–10.5)
nRBC: 0.4 % — ABNORMAL HIGH (ref 0.0–0.2)

## 2021-11-03 LAB — COMPREHENSIVE METABOLIC PANEL
ALT: 17 U/L (ref 0–44)
AST: 28 U/L (ref 15–41)
Albumin: 2.1 g/dL — ABNORMAL LOW (ref 3.5–5.0)
Alkaline Phosphatase: 66 U/L (ref 38–126)
Anion gap: 5 (ref 5–15)
BUN: 18 mg/dL (ref 6–20)
CO2: 23 mmol/L (ref 22–32)
Calcium: 8.1 mg/dL — ABNORMAL LOW (ref 8.9–10.3)
Chloride: 112 mmol/L — ABNORMAL HIGH (ref 98–111)
Creatinine, Ser: 0.81 mg/dL (ref 0.44–1.00)
GFR, Estimated: >60 mL/min (ref >60)
Glucose, Bld: 91 mg/dL (ref 70–99)
Potassium: 3.4 mmol/L — ABNORMAL LOW (ref 3.5–5.1)
Sodium: 140 mmol/L (ref 135–145)
Total Bilirubin: 0.3 mg/dL (ref 0.3–1.2)
Total Protein: 4.5 g/dL — ABNORMAL LOW (ref 6.5–8.1)

## 2021-11-03 LAB — MAGNESIUM: Magnesium: 2 mg/dL (ref 1.7–2.4)

## 2021-11-03 MED ORDER — POTASSIUM CHLORIDE CRYS ER 20 MEQ PO TBCR
60.0000 meq | EXTENDED_RELEASE_TABLET | Freq: Once | ORAL | Status: AC
  Administered 2021-11-03: 60 meq via ORAL
  Filled 2021-11-03: qty 3

## 2021-11-03 NOTE — Progress Notes (Signed)
Progress Note   Patient: Jessica Lynn WPY:[id number] DOB: 12-27-1960 DOA: 10/28/2021     6 DOS: the patient was seen and examined on 11/03/2021   Brief hospital course: 61 year old with history of chronic bilateral extremity ulcers, retinal vasculitis presented to the emergency room with abnormal kidney functions called by her rheumatologist.  Patient is taken care of at wound care clinic and also followed by rheumatologist.  Recently treated with Humira and subsequently with prednisone that was tapered off.  She went to rheumatologist yesterday who did a routine blood test and found to have AKI, labs unavailable with previous normal renal functions so was asked to go to emergency room.  Recently taken Augmentin.  Seen at wound care clinic few days ago. Initially in the emergency room she was noted to be hypotensive requiring 4 L of fluid, blood cultures were drawn and she was started on antibiotics. Patient was found to have a pocket of infection on the right leg, underwent incision debridement and wound VAC placement. Renal functions normalized.  She is revisit debridement today by Dr. Sharol Given.  Clinically improving.    Assessment and Plan: Severe sepsis present on admission: Secondary to right leg abscess.    Presented with hypotension, lactic acidosis and WBC count of 12.2.  Was on daptomycin.  Incision drainage and debridement of the skin subcutaneous tissue and fascia 7/26.  Redo surgery performed 7/28. Per ID, transitioned to cefazolin through weekend then transition to cefadroxil x 2 more weeks starting 7/31 to 8/14 Anticipate home with portable wound VAC and on oral antibiotics.   Acute kidney injury: Prerenal in the setting of sepsis and hypotension.   Aggressively resuscitated.  Kidney functions normalized.   Patient was on losartan hydrochlorothiazide as well as Lasix at home. Renal ultrasound essentially normal.  Urine output is adequate. Renal functions normalized.  Will avoid  losartan hydrochlorothiazide.  Essential hypertension: Blood pressures low normal.  Hold all antihypertensives.   Hypotension: Probably due to #2.  She was recently on prednisone and that was tapered off.  Do not expect adrenal insufficiency with normal cortisol levels. Blood pressures are mostly more than 100. Improving perfusion to the kidneys and no evidence of orthostatic blood pressure drop, monitor.   Hypomagnesemia: corrected  Hypokalemia: will replace   Anemia: Acute on chronic anemia.  Probably dilutional as well as surgical blood loss. S/p 1 unit PRBC Hgb seems stable Cont to follow       Subjective: Looking forward to going home soon  Physical Exam: Vitals:   11/02/21 1703 11/02/21 2112 11/03/21 0520 11/03/21 0936  BP: (!) 98/57 (!) 101/56 (!) 91/50 93/66  Pulse: 79 91 80 83  Resp: '18 18 18 17  '$ Temp: 98.4 F (36.9 C) 98.4 F (36.9 C) 97.8 F (36.6 C) 98.6 F (37 C)  TempSrc: Oral Oral  Oral  SpO2: 100% 100% 100% 100%  Weight:      Height:       General exam: Conversant, in no acute distress Respiratory system: normal chest rise, clear, no audible wheezing Cardiovascular system: regular rhythm, s1-s2 Gastrointestinal system: Nondistended, nontender, pos BS Central nervous system: No seizures, no tremors Extremities: No cyanosis, no joint deformities, BLE  Skin: No rashes, no pallor Psychiatry: Affect normal // no auditory hallucinations    Data Reviewed:  Labs reviewed: 140, K 3.4, Cr. 0.81  Family Communication: Pt in room, family at bedside  Disposition: Status is: Inpatient Remains inpatient appropriate because: Severity of illness  Planned Discharge Destination: Home  Author: Marylu Lund, MD 11/03/2021 4:13 PM  For on call review [email or web address].

## 2021-11-03 NOTE — Progress Notes (Signed)
     Subjective: 2 Days Post-Op Procedure(s) (LRB): DEBRIDEMENT RIGHT LEG, WOUND VAC CHANGE (Right) Awake, alert and oriented x 4. In good spirits.   Patient reports pain as moderate.    Objective:   VITALS:  Temp:  [97.8 F (36.6 C)-98.6 F (37 C)] 98.6 F (37 C) (07/30 0936) Pulse Rate:  [79-91] 83 (07/30 0936) Resp:  [17-18] 17 (07/30 0936) BP: (91-101)/(50-66) 93/66 (07/30 0936) SpO2:  [100 %] 100 % (07/30 0936)  Neurologically intact ABD soft Neurovascular intact Sensation intact distally Intact pulses distally Incision: dressing C/D/I, no drainage, and VAC intact and drawing negative pressure.   LABS Recent Labs    11/01/21 0450 11/01/21 1301 11/02/21 0239 11/03/21 0427  HGB 6.6* 8.8* 7.5* 7.3*  WBC 9.9  --  10.2 10.0  PLT 173  --  190 197   Recent Labs    11/02/21 0239 11/03/21 0427  NA 139 140  K 4.3 3.4*  CL 111 112*  CO2 23 23  BUN 19 18  CREATININE 0.89 0.81  GLUCOSE 121* 91   No results for input(s): "LABPT", "INR" in the last 72 hours.   Assessment/Plan: 2 Days Post-Op Procedure(s) (LRB): DEBRIDEMENT RIGHT LEG, WOUND VAC CHANGE (Right)  Advance diet Up with therapy D/C IV fluids Discharge home with home health  Jessica Lynn 11/03/2021, 1:02 PM Patient ID: Jessica Lynn, female   DOB: 04/14/1960, 61 y.o.   MRN: [id number]

## 2021-11-04 ENCOUNTER — Encounter (HOSPITAL_COMMUNITY): Payer: Self-pay | Admitting: Orthopedic Surgery

## 2021-11-04 DIAGNOSIS — A419 Sepsis, unspecified organism: Secondary | ICD-10-CM | POA: Diagnosis not present

## 2021-11-04 DIAGNOSIS — R6520 Severe sepsis without septic shock: Secondary | ICD-10-CM | POA: Diagnosis not present

## 2021-11-04 DIAGNOSIS — N170 Acute kidney failure with tubular necrosis: Secondary | ICD-10-CM | POA: Diagnosis not present

## 2021-11-04 LAB — COMPREHENSIVE METABOLIC PANEL
ALT: 12 U/L (ref 0–44)
AST: 27 U/L (ref 15–41)
Albumin: 2.1 g/dL — ABNORMAL LOW (ref 3.5–5.0)
Alkaline Phosphatase: 65 U/L (ref 38–126)
Anion gap: 6 (ref 5–15)
BUN: 13 mg/dL (ref 6–20)
CO2: 24 mmol/L (ref 22–32)
Calcium: 8.4 mg/dL — ABNORMAL LOW (ref 8.9–10.3)
Chloride: 110 mmol/L (ref 98–111)
Creatinine, Ser: 0.78 mg/dL (ref 0.44–1.00)
GFR, Estimated: >60 mL/min (ref >60)
Glucose, Bld: 93 mg/dL (ref 70–99)
Potassium: 3.9 mmol/L (ref 3.5–5.1)
Sodium: 140 mmol/L (ref 135–145)
Total Bilirubin: 0.3 mg/dL (ref 0.3–1.2)
Total Protein: 4.6 g/dL — ABNORMAL LOW (ref 6.5–8.1)

## 2021-11-04 LAB — AEROBIC/ANAEROBIC CULTURE W GRAM STAIN (SURGICAL/DEEP WOUND)

## 2021-11-04 LAB — CORTISOL: Cortisol, Plasma: 13.3 ug/dL

## 2021-11-04 LAB — CBC
HCT: 22.7 % — ABNORMAL LOW (ref 36.0–46.0)
Hemoglobin: 7.6 g/dL — ABNORMAL LOW (ref 12.0–15.0)
MCH: 34.1 pg — ABNORMAL HIGH (ref 26.0–34.0)
MCHC: 33.5 g/dL (ref 30.0–36.0)
MCV: 101.8 fL — ABNORMAL HIGH (ref 80.0–100.0)
Platelets: 209 10*3/uL (ref 150–400)
RBC: 2.23 MIL/uL — ABNORMAL LOW (ref 3.87–5.11)
RDW: 15.3 % (ref 11.5–15.5)
WBC: 9.1 10*3/uL (ref 4.0–10.5)
nRBC: 0.4 % — ABNORMAL HIGH (ref 0.0–0.2)

## 2021-11-04 LAB — ZINC: Zinc: 97 ug/dL (ref 44–115)

## 2021-11-04 MED ORDER — HYDROCORTISONE 5 MG PO TABS
5.0000 mg | ORAL_TABLET | Freq: Two times a day (BID) | ORAL | Status: DC
Start: 2021-11-04 — End: 2021-11-05
  Administered 2021-11-04 – 2021-11-05 (×3): 5 mg via ORAL
  Filled 2021-11-04 (×5): qty 1

## 2021-11-04 MED ORDER — LACTATED RINGERS IV BOLUS
1000.0000 mL | Freq: Once | INTRAVENOUS | Status: AC
  Administered 2021-11-04: 1000 mL via INTRAVENOUS

## 2021-11-04 NOTE — TOC Progression Note (Signed)
Transition of Care [facility]) - Progression Note    Patient Details  Name: Jessica Lynn MRN: [id number] Date of Birth: 05-Nov-1960  Transition of Care [facility]) CM/SW Contact  Tom-Johnson, Renea Ee, RN Phone Number: 11/04/2021, 1:22 PM  Clinical Narrative:     Home health referral called in to [facility] and [facility] voiced acceptance per patient's choice from Medicare.gov list. Info on AVS. BSC and RW ordered from [employer] and [facility] to deliver at bedside.  CM will continue to follow with needs as patient progresses with care.   Expected Discharge Plan: [place] Barriers to Discharge: Continued Medical Work up  Expected Discharge Plan and Services Expected Discharge Plan: [place]   Discharge Planning Services: CM Consult   Living arrangements for the past 2 months: Single Family Home                 DME Arranged: 3-N-1, Walker rolling DME Agency: [employer] Date DME Agency Contacted: 11/04/21 Time DME Agency Contacted: 30 Representative spoke with at DME Agency: Jodell Cipro HH Arranged: PT, OT [place] Agency: [facility] Date [place]: 11/04/21 Time [place]: 1122 Representative spoke with at [place]: Delsa Sale   Social Determinants of Health ([place]) Interventions    Readmission Risk Interventions     No data to display

## 2021-11-04 NOTE — Progress Notes (Signed)
PT Cancellation Note  Patient Details Name: Jessica Lynn MRN: [id number] DOB: [birth year]   Cancelled Treatment:    Reason Eval/Treat Not Completed: Other (comment)  Patient about to start her online class that she takes Monday and Wednesday. Politely refused. Is up in chair and has been walking in room per pt and RN.   Arby Barrette, PT Acute Rehabilitation Services  Office [phone]  Jessica Lynn 11/04/2021, 3:05 PM

## 2021-11-04 NOTE — Progress Notes (Signed)
Progress Note   Patient: Jessica Lynn IRW:[id number] DOB: 02-13-1961 DOA: 10/28/2021     7 DOS: the patient was seen and examined on 11/04/2021   Brief hospital course: 61 year old with history of chronic bilateral extremity ulcers, retinal vasculitis presented to the emergency room with abnormal kidney functions called by her rheumatologist.  Patient is taken care of at wound care clinic and also followed by rheumatologist.  Recently treated with Humira and subsequently with prednisone that was tapered off.  She went to rheumatologist yesterday who did a routine blood test and found to have AKI, labs unavailable with previous normal renal functions so was asked to go to emergency room.  Recently taken Augmentin.  Seen at wound care clinic few days ago. Initially in the emergency room she was noted to be hypotensive requiring 4 L of fluid, blood cultures were drawn and she was started on antibiotics. Patient was found to have a pocket of infection on the right leg, underwent incision debridement and wound VAC placement. Renal functions normalized.  She is revisit debridement today by Dr. Sharol Given.  Clinically improving.    Assessment and Plan: Severe sepsis present on admission: Secondary to right leg abscess.    Presented with hypotension, lactic acidosis and WBC count of 12.2.  Was on daptomycin.  Incision drainage and debridement of the skin subcutaneous tissue and fascia 7/26.  Redo surgery performed 7/28. Per ID, transitioned to cefazolin through weekend and now on cefadroxil x 2 more weeks starting 7/31 to 8/14 Anticipate home with portable wound VAC and on oral antibiotics.   Acute kidney injury: Prerenal in the setting of sepsis and hypotension.   Aggressively resuscitated.  Kidney functions normalized.   Patient was on losartan hydrochlorothiazide as well as Lasix at home. Renal ultrasound essentially normal.  Urine output is adequate. Renal functions normalized.  Will avoid losartan  hydrochlorothiazide.  Essential hypertension: Blood pressures now low, see below  Hold all antihypertensives.  Hypotension: Pt reports previously being on extended course of high dose prednisone ('60mg'$  daily x 3 weeks, followed by '50mg'$  for at least one month, followed by prolonged taper). Pt did not like effects of high dosed steroids, thus essentially stopped taper prematurely and abruptly -SBP overnight in the 80's -Orthostatics neg -Giving trial of hydrocortisone. Would ultimately recommend continuing slow pred taper as outpatient after d/c  Hypomagnesemia: corrected  Hypokalemia: will replace   Anemia: Acute on chronic anemia.  Probably dilutional as well as surgical blood loss. S/p 1 unit PRBC Hgb seems stable Cont to follow       Subjective: Without complaints this AM. Denies chest pain or sob  Physical Exam: Vitals:   11/03/21 2058 11/04/21 0446 11/04/21 0501 11/04/21 0945  BP: 107/80 (!) 81/62 (!) 83/54 99/69  Pulse: 82 77 74 75  Resp: '18 17 20 18  '$ Temp: 98.4 F (36.9 C) 98.7 F (37.1 C)  98.2 F (36.8 C)  TempSrc: Oral Oral    SpO2: 100% 100%  100%  Weight:      Height:       General exam: Awake, laying in bed, in nad Respiratory system: Normal respiratory effort, no wheezing Cardiovascular system: regular rate, s1, s2 Gastrointestinal system: Soft, nondistended, positive BS Central nervous system: CN2-12 grossly intact, strength intact Extremities: Perfused, no clubbing Skin: Normal skin turgor, no notable skin lesions seen Psychiatry: Mood normal // no visual hallucinations   Data Reviewed:  Labs reviewed: 140, K 3.9, Cr. 0.78  Family Communication: Pt in room, family  at bedside  Disposition: Status is: Inpatient Remains inpatient appropriate because: Severity of illness  Planned Discharge Destination: Home     Author: Marylu Lund, MD 11/04/2021 4:47 PM  For on call review [email or web address].

## 2021-11-04 NOTE — Progress Notes (Signed)
Inpatient Rehab Admissions Coordinator:   Pt. Stating preference for home with home health. I will not pursue for CIR.[name]   Clemens Catholic, [place], [place] Admissions Coordinator  [phone] ([place]) [phone] (office)

## 2021-11-04 NOTE — Progress Notes (Signed)
PT Cancellation Note  Patient Details Name: Jessica Lynn MRN: [id number] DOB: November 15, 1960   Cancelled Treatment:    Reason Eval/Treat Not Completed: Pain limiting ability to participate  Patient reports she has been waiting on pain medication so she can begin to get up and get moving. RN made aware. Patient also reports she was told she is going home today and is now going to her daughter's home where she will not have to go up any steps.    [place]  Office [phone]  Rexanne Mano 11/04/2021, 9:25 AM

## 2021-11-04 NOTE — Plan of Care (Signed)
  Problem: Fluid Volume: Goal: Hemodynamic stability will improve Outcome: Completed/Met   Problem: Clinical Measurements: Goal: Diagnostic test results will improve Outcome: Completed/Met Goal: Signs and symptoms of infection will decrease Outcome: Completed/Met   Problem: Respiratory: Goal: Ability to maintain adequate ventilation will improve Outcome: Completed/Met   Problem: Education: Goal: Knowledge of General Education information will improve Description: Including pain rating scale, medication(s)/side effects and non-pharmacologic comfort measures Outcome: Completed/Met   Problem: Health Behavior/Discharge Planning: Goal: Ability to manage health-related needs will improve Outcome: Completed/Met   Problem: Clinical Measurements: Goal: Ability to maintain clinical measurements within normal limits will improve Outcome: Completed/Met Goal: Diagnostic test results will improve Outcome: Completed/Met Goal: Respiratory complications will improve Outcome: Completed/Met Goal: Cardiovascular complication will be avoided Outcome: Completed/Met   Problem: Activity: Goal: Risk for activity intolerance will decrease Outcome: Completed/Met   Problem: Nutrition: Goal: Adequate nutrition will be maintained Outcome: Completed/Met   Problem: Coping: Goal: Level of anxiety will decrease Outcome: Completed/Met   Problem: Elimination: Goal: Will not experience complications related to bowel motility Outcome: Completed/Met Goal: Will not experience complications related to urinary retention Outcome: Completed/Met   Problem: Pain Managment: Goal: General experience of comfort will improve Outcome: Completed/Met   Problem: Safety: Goal: Ability to remain free from injury will improve Outcome: Completed/Met

## 2021-11-04 NOTE — Progress Notes (Signed)
SBP is low 80s this am. Patient is asymptomatic, afebrile, and am labs appear stable. Plan to give an IVF bolus.

## 2021-11-05 ENCOUNTER — Other Ambulatory Visit (HOSPITAL_COMMUNITY): Payer: Self-pay

## 2021-11-05 DIAGNOSIS — A419 Sepsis, unspecified organism: Secondary | ICD-10-CM | POA: Diagnosis not present

## 2021-11-05 DIAGNOSIS — R6520 Severe sepsis without septic shock: Secondary | ICD-10-CM | POA: Diagnosis not present

## 2021-11-05 DIAGNOSIS — N170 Acute kidney failure with tubular necrosis: Secondary | ICD-10-CM | POA: Diagnosis not present

## 2021-11-05 DIAGNOSIS — N179 Acute kidney failure, unspecified: Secondary | ICD-10-CM | POA: Diagnosis not present

## 2021-11-05 LAB — COMPREHENSIVE METABOLIC PANEL
ALT: 12 U/L (ref 0–44)
AST: 29 U/L (ref 15–41)
Albumin: 2.2 g/dL — ABNORMAL LOW (ref 3.5–5.0)
Alkaline Phosphatase: 71 U/L (ref 38–126)
Anion gap: 5 (ref 5–15)
BUN: 12 mg/dL (ref 6–20)
CO2: 24 mmol/L (ref 22–32)
Calcium: 8.2 mg/dL — ABNORMAL LOW (ref 8.9–10.3)
Chloride: 111 mmol/L (ref 98–111)
Creatinine, Ser: 0.66 mg/dL (ref 0.44–1.00)
GFR, Estimated: >60 mL/min (ref >60)
Glucose, Bld: 104 mg/dL — ABNORMAL HIGH (ref 70–99)
Potassium: 3.9 mmol/L (ref 3.5–5.1)
Sodium: 140 mmol/L (ref 135–145)
Total Bilirubin: 0.6 mg/dL (ref 0.3–1.2)
Total Protein: 4.8 g/dL — ABNORMAL LOW (ref 6.5–8.1)

## 2021-11-05 LAB — CBC
HCT: 22.6 % — ABNORMAL LOW (ref 36.0–46.0)
Hemoglobin: 7.6 g/dL — ABNORMAL LOW (ref 12.0–15.0)
MCH: 33.8 pg (ref 26.0–34.0)
MCHC: 33.6 g/dL (ref 30.0–36.0)
MCV: 100.4 fL — ABNORMAL HIGH (ref 80.0–100.0)
Platelets: 233 10*3/uL (ref 150–400)
RBC: 2.25 MIL/uL — ABNORMAL LOW (ref 3.87–5.11)
RDW: 15.1 % (ref 11.5–15.5)
WBC: 8.5 10*3/uL (ref 4.0–10.5)
nRBC: 0.2 % (ref 0.0–0.2)

## 2021-11-05 LAB — VITAMIN C: Vitamin C: 0.4 mg/dL (ref 0.4–2.0)

## 2021-11-05 MED ORDER — PREDNISONE 10 MG PO TABS
20.0000 mg | ORAL_TABLET | Freq: Every day | ORAL | 0 refills | Status: AC
  Filled 2021-11-05: qty 28, 14d supply, fill #0

## 2021-11-05 MED ORDER — METHOCARBAMOL 500 MG PO TABS
500.0000 mg | ORAL_TABLET | Freq: Four times a day (QID) | ORAL | 0 refills | Status: DC | PRN
  Filled 2021-11-05: qty 20, 5d supply, fill #0

## 2021-11-05 MED ORDER — ZINC SULFATE 220 (50 ZN) MG PO TABS
220.0000 mg | ORAL_TABLET | Freq: Every day | ORAL | 0 refills | Status: DC
  Filled 2021-11-05: qty 30, 30d supply, fill #0

## 2021-11-05 MED ORDER — CEFADROXIL 500 MG PO CAPS
1000.0000 mg | ORAL_CAPSULE | Freq: Two times a day (BID) | ORAL | 0 refills | Status: AC
  Filled 2021-11-05: qty 52, 13d supply, fill #0

## 2021-11-05 NOTE — Progress Notes (Signed)
Patient discharge teaching given, including activity, diet, follow-up appoints, and medications. Patient verbalized understanding of all discharge instructions. IV access was d/c'd. Vitals are stable. Skin is intact except as charted in most recent assessments. Pt to be escorted out by NT, to be driven home by family.  Rolling Walker, prevena and 3 in 1 delivered to patient. Prevena placed on patient with instructions on trouble shooting. TOC meds delivered to patient.

## 2021-11-05 NOTE — Plan of Care (Signed)
  Problem: Clinical Measurements: Goal: Ability to avoid or minimize complications of infection will improve Outcome: Completed/Met   Problem: Clinical Measurements: Goal: Will remain free from infection Outcome: Completed/Met

## 2021-11-05 NOTE — TOC Transition Note (Signed)
Transition of Care [facility]) - CM/SW Discharge Note   Patient Details  Name: BOBBETTE EAKES MRN: [id number] Date of Birth: 1960-09-19  Transition of Care [place] [facility] CM/SW Contact:  Tom-Johnson, Renea Ee, RN Phone Number: 11/05/2021, 2:30 PM   Clinical Narrative:     Patient is scheduled for discharge today. Home health info on AVS. BSC and RW delivered at bedside. Transitioned to oral cefadroxil till 08/14. Discharge home with Prevena wound vac.  Family to transport at discharge. No further TOC needs noted.    Final next level of care: [place] Barriers to Discharge: Barriers Resolved   Patient Goals and CMS Choice Patient states their goals for this hospitalization and ongoing recovery are:: To return  home CMS Medicare.gov Compare Post Acute Care list provided to:: Patient Choice offered to / list presented to : Patient  Discharge Placement                Patient to be transferred to facility by: Daughter      Discharge Plan and Services   Discharge Planning Services: CM Consult            DME Arranged: 3-N-1, Walker rolling DME Agency: [employer] Date DME Agency Contacted: 11/04/21 Time DME Agency Contacted: 18 Representative spoke with at DME Agency: Jodell Cipro HH Arranged: PT, OT [place] Agency: [facility] [place] Date [place]: 11/04/21 Time [place]: 1122 Representative spoke with at [place]: Delsa Sale  Social Determinants of Health ([place]) Interventions     Readmission Risk Interventions     No data to display

## 2021-11-05 NOTE — Progress Notes (Signed)
Physical Therapy Treatment and Discharge Patient Details Name: Jessica Lynn MRN: [id number] DOB: July 14, 1960 Today's Date: 11/05/2021   History of Present Illness 61 y.o. female presented to ED 7/24 with worsening LE pain/swelling and ulcers caused by hives that eventually ulcerated. Work-up (+) for cellulitis, sepsis, hyponatremia and AKI. S/p R LE I&D on 7/26. S/p R LE wound closure and wound vac change on 7/28. PMHx significant for chronic BLE ulcers followed by outpatient wound care, Hx of retinal vasculitis, DLD and HTN.    PT Comments    Patient mobilizing well with PT this date. Has met all PT goals except for stairs (goal deferred as pt now going to her daughter's home where she will not have any stairs). Will continue to benefit from HHPT to further progress her gait training and ultimately return to using no device.     Recommendations for follow up therapy are one component of a multi-disciplinary discharge planning process, led by the attending physician.  Recommendations may be updated based on patient status, additional functional criteria and insurance authorization.  Follow Up Recommendations  Home health PT     Assistance Recommended at Discharge Intermittent Supervision/Assistance  Patient can return home with the following Assistance with cooking/housework;Help with stairs or ramp for entrance;Assist for transportation;A little help with bathing/dressing/bathroom   Equipment Recommendations  Rolling walker (2 wheels);BSC/3in1    Recommendations for Other Services       Precautions / Restrictions Precautions Precautions: Fall Restrictions Weight Bearing Restrictions: Yes RLE Weight Bearing: Weight bearing as tolerated     Mobility  Bed Mobility Overal bed mobility: Modified Independent Bed Mobility: Supine to Sit     Supine to sit: Modified independent (Device/Increase time)     General bed mobility comments: sitting EOB on arrival     Transfers Overall transfer level: Needs assistance Equipment used: Rolling walker (2 wheels) Transfers: Sit to/from Stand Sit to Stand: Supervision           General transfer comment: pt with proper hand placement; incr time and effort but no physical assist needed    Ambulation/Gait Ambulation/Gait assistance: Modified independent (Device/Increase time) Gait Distance (Feet): 200 Feet Assistive device: Rolling walker (2 wheels) Gait Pattern/deviations: Decreased stride length, Decreased stance time - right, Step-to pattern Gait velocity: decreased     General Gait Details: no cues needed; educated pt on progressing to step-through pattern with continuous rolling of RW and lighter pressure on her hands   Stairs Stairs:  (deferred as pt now going to her daughter's home with no steps)           Wheelchair Mobility    Modified Rankin (Stroke Patients Only)       Balance Overall balance assessment: Needs assistance Sitting-balance support: No upper extremity supported, Feet supported Sitting balance-[name]: Good     Standing balance support: No upper extremity supported, During functional activity Standing balance-[name]: Fair Standing balance comment: can stand without UE support; needs support for ambulation                            Cognition Arousal/Alertness: Awake/alert Behavior During Therapy: WFL for tasks assessed/performed Overall Cognitive Status: Within Functional Limits for tasks assessed                                          Exercises  General Comments General comments (skin integrity, edema, etc.): Educated on benefits of mobility (system-wide). Patient agrees and states she will continue walking/moving when she goes home      Pertinent Vitals/Pain Pain Assessment Pain Assessment: 0-10 Pain Score: 5  Pain Location: R LE Pain Descriptors / Indicators: Sore Pain Intervention(s): Limited  activity within patient's tolerance, Premedicated before session, Monitored during session    Home Living                          Prior Function            PT Goals (current goals can now be found in the care plan section) Acute Rehab PT Goals Patient Stated Goal: to go home PT Goal Formulation: With patient Time For Goal Achievement: 11/12/21 Potential to Achieve Goals: Good Progress towards PT goals: Goals met/education completed, patient discharged from PT    Frequency    Min 3X/week      PT Plan Current plan remains appropriate    Co-evaluation              AM-PAC PT "6 Clicks" Mobility   Outcome Measure  Help needed turning from your back to your side while in a flat bed without using bedrails?: None Help needed moving from lying on your back to sitting on the side of a flat bed without using bedrails?: None Help needed moving to and from a bed to a chair (including a wheelchair)?: A Little Help needed standing up from a chair using your arms (e.g., wheelchair or bedside chair)?: A Little Help needed to walk in hospital room?: None Help needed climbing 3-5 steps with a railing? : A Little 6 Click Score: 21    End of Session   Activity Tolerance: Patient tolerated treatment well Patient left: in chair;with call bell/phone within reach;with family/visitor present   PT Visit Diagnosis: Other abnormalities of gait and mobility (R26.89);Difficulty in walking, not elsewhere classified (R26.2);Pain Pain - Right/Left: Right Pain - part of body: Leg   PT Discharge Note  Patient is being discharged from PT services secondary to:  Goals met and no further therapy needs identified.  Please see latest Therapy Progress Note for current level of functioning and progress toward goals.  Progress and discharge plan and discussed with patient/caregiver and they  Agree   Time: 1025-1100 PT Time Calculation (min) (ACUTE ONLY): 35 min  Charges:  $Gait  Training: 23-37 mins                      [place]  Office [phone]    Rexanne Mano 11/05/2021, 11:12 AM

## 2021-11-05 NOTE — Discharge Summary (Signed)
Physician Discharge Summary   Patient: Jessica Lynn MRN: [id number] DOB: [birth year]  Admit date:     10/28/2021  Discharge date: 11/05/21  Discharge Physician: Marylu Lund   PCP: Cassandria Anger, MD   Recommendations at discharge:    Follow up with PCP in 1-2 weeks Follow up with Orthopedic Surgery as scheduled Please f/u with pt's prolonged steroid taper. Pt reported stopping high dose prednisone "cold [place]" and did not fill outpt taper rx, resulting in hypotension  Discharge Diagnoses: Principal Problem:   Severe sepsis ([place]) Active Problems:   Essential hypertension   Dyslipidemia   Uveitis of both eyes   Cellulitis   Hyponatremia   AKI (acute kidney injury) ([place])   Anemia   Acute kidney injury (AKI) with acute tubular necrosis (ATN) ([facility])   Necrotizing fasciitis ([place])   Abscess of right lower leg   Protein-calorie malnutrition, severe  Resolved Problems:   * No resolved hospital problems. *  Hospital Course: 61 year old with history of chronic bilateral extremity ulcers, retinal vasculitis presented to the emergency room with abnormal kidney functions called by her rheumatologist.  Patient is taken care of at wound care clinic and also followed by rheumatologist.  Recently treated with Humira and subsequently with prednisone that was tapered off.  She went to rheumatologist yesterday who did a routine blood test and found to have AKI, labs unavailable with previous normal renal functions so was asked to go to emergency room.  Recently taken Augmentin.  Seen at wound care clinic few days ago. Initially in the emergency room she was noted to be hypotensive requiring 4 L of fluid, blood cultures were drawn and she was started on antibiotics. Patient was found to have a pocket of infection on the right leg, underwent incision debridement and wound VAC placement. Renal functions normalized.  She is revisit debridement today by Dr. Sharol Given.  Clinically improving.     Assessment and Plan: Severe sepsis present on admission: Secondary to right leg abscess.    Presented with hypotension, lactic acidosis and WBC count of 12.2.  Was on daptomycin.  Incision drainage and debridement of the skin subcutaneous tissue and fascia 7/26.  Redo surgery performed 7/28. Per ID, was transitioned to cefazolin through weekend and now on cefadroxil x 2 more weeks starting 7/31 to 8/14 Discharge home with portable wound VAC and on oral antibiotics.   Acute kidney injury: Prerenal in the setting of sepsis and hypotension.   Aggressively resuscitated.  Kidney functions normalized.   Patient was on losartan hydrochlorothiazide as well as Lasix at home. Renal ultrasound essentially normal.  Urine output is adequate. Renal functions normalized.  Will avoid losartan hydrochlorothiazide.  Essential hypertension: Blood pressures now low, see below  Hold all antihypertensives for now.   Hypotension: Pt reports previously being on extended course of high dose prednisone ('60mg'$  daily x 3 weeks, followed by '60mg'$  for at least one month, followed by prolonged taper). Pt did not like effects of high dosed steroids, thus essentially stopped taper prematurely and abruptly -SBP overnight dropped to the 80's -Orthostatics neg -BP improved with trial of hydrocortisone.  -discharged on lower dose of prednisone, anticipate prolonged taper given long course of prednisone prior to admit   Hypomagnesemia: corrected   Hypokalemia: will replace   Anemia: Acute on chronic anemia.  Probably dilutional as well as surgical blood loss. S/p 1 unit PRBC Hgb seems stable Cont to follow        Consultants: Orthopedic Surgery, ID Procedures performed:  DEBRIDEMENT RIGHT LEG  Disposition: Home Diet recommendation:  Regular diet DISCHARGE MEDICATION: Allergies as of 11/05/2021       Reactions   Crestor [rosuvastatin] Other (See Comments)   cramps   Oxycodone Itching        Medication List      STOP taking these medications    amoxicillin-clavulanate 875-125 MG tablet Commonly known as: AUGMENTIN   brimonidine-timolol 0.2-0.5 % ophthalmic solution Commonly known as: COMBIGAN   clonazePAM 1 MG tablet Commonly known as: KLONOPIN   furosemide 40 MG tablet Commonly known as: LASIX   Humira 40 MG/0.4ML Pskt Generic drug: Adalimumab   Klor-Con M20 20 MEQ tablet Generic drug: potassium chloride SA   triamcinolone cream 0.1 % Commonly known as: KENALOG   valsartan-hydrochlorothiazide 160-12.5 MG tablet Commonly known as: DIOVAN-HCT       TAKE these medications    cefadroxil 500 MG capsule Commonly known as: DURICEF Take 2 capsules (1,000 mg total) by mouth 2 (two) times daily for 13 days.   dorzolamide-timolol 22.3-6.8 MG/ML ophthalmic solution Commonly known as: COSOPT Place 1 drop into the right eye 2 (two) times daily.   folic acid 1 MG tablet Commonly known as: FOLVITE TAKE 1 TABLET(1 MG) BY MOUTH DAILY What changed: See the new instructions.   HYDROcodone-acetaminophen 5-325 MG tablet Commonly known as: NORCO/VICODIN Take 1 tablet by mouth 3 (three) times daily as needed for severe pain. What changed: when to take this   hydrOXYzine 25 MG tablet Commonly known as: ATARAX TAKE 1 TABLET BY MOUTH EVERY 8 HOURS AS NEEDED FOR ITCHING What changed: See the new instructions.   methocarbamol 500 MG tablet Commonly known as: ROBAXIN Take 1 tablet (500 mg total) by mouth every 6 (six) hours as needed for muscle spasms.   montelukast 10 MG tablet Commonly known as: SINGULAIR TAKE 1 TABLET(10 MG) BY MOUTH DAILY What changed: See the new instructions.   naproxen 500 MG tablet Commonly known as: NAPROSYN Take 500 mg by mouth daily as needed (swelling in knee).   pantoprazole 40 MG tablet Commonly known as: PROTONIX Take 1 tablet (40 mg total) by mouth daily.   pravastatin 20 MG tablet Commonly known as: PRAVACHOL TAKE 1 TABLET(20 MG) BY MOUTH  DAILY   prednisoLONE acetate 1 % ophthalmic suspension Commonly known as: PRED FORTE Place 1 drop into both eyes 4 (four) times daily.   predniSONE 10 MG tablet Commonly known as: DELTASONE Take 2 tablets (20 mg total) by mouth daily for 14 days. What changed:  how much to take how to take this when to take this additional instructions   Santyl 250 UNIT/GM ointment Generic drug: collagenase Apply 1 Application topically daily.   Vitamin D3 50 MCG (2000 UT) capsule Take 1 capsule (2,000 Units total) by mouth daily.   Zinc Sulfate 220 (50 Zn) MG Tabs Take 1 tablet (220 mg total) by mouth daily. Start taking on: November 06, 2021   zolpidem 10 MG tablet Commonly known as: AMBIEN Take 1 tablet (10 mg total) by mouth at bedtime as needed for sleep. What changed: when to take this        Follow-up Information     [name], MD Follow up in 1 week(s).   Specialty: Orthopedic Surgery Contact information: [place] [address] [phone]         Plotnikov, Evie Lacks, MD Follow up.   Specialty: Internal Medicine Why: as already scheduled, Hospital follow up Contact information: [place] [address] [phone]                Discharge Exam: Danley Danker Weights   10/29/21 2015 10/30/21 1216  Weight: 78.5 kg 78.5 kg   General exam: Awake, laying in bed, in nad Respiratory system: Normal respiratory effort, no wheezing Cardiovascular system: regular rate, s1, s2 Gastrointestinal system: Soft, nondistended, positive BS Central nervous system: CN2-12 grossly intact, strength intact Extremities: Perfused, no clubbing Skin: Normal skin turgor, no notable skin lesions seen Psychiatry: Mood normal // no visual hallucinations   Condition at discharge: fair  The results of significant diagnostics from this hospitalization (including imaging, microbiology, ancillary and laboratory) are listed below for reference.   Imaging  Studies: VAS [place] ABI WITH/WO TBI  Result Date: 10/31/2021  LOWER EXTREMITY DOPPLER STUDY Patient Name:  Jessica Lynn  Date of Exam:   10/31/2021 Medical Rec #: [id number]       Accession #:    [id number] Date of Birth: 1960/08/17       Patient Gender: F Patient Age:   61 years Exam Location:  [facility] Procedure:      VAS [place] ABI WITH/WO TBI Referring Phys: MARCUS DUDA --------------------------------------------------------------------------------  Indications: Ulceration, and Sepsis, necrotizing fasciitis. High Risk Factors: Hypertension, hyperlipidemia.  Comparison Study: No prior study Performing Technologist: Sharion Dove RVS  Examination Guidelines: A complete evaluation includes at minimum, Doppler waveform signals and systolic blood pressure reading at the level of bilateral brachial, anterior tibial, and posterior tibial arteries, when vessel segments are accessible. Bilateral testing is considered an integral part of a complete examination. Photoelectric Plethysmograph (PPG) waveforms and toe systolic pressure readings are included as required and additional duplex testing as needed. Limited examinations for reoccurring indications may be performed as noted.  ABI Findings: +---------+------------------+-----+-----------+--------+ Right    Rt Pressure (mmHg)IndexWaveform   Comment  +---------+------------------+-----+-----------+--------+ Brachial 118                    triphasic           +---------+------------------+-----+-----------+--------+ PTA      118               1.00 multiphasic         +---------+------------------+-----+-----------+--------+ DP       144               1.22 multiphasic         +---------+------------------+-----+-----------+--------+ Great Toe71                0.60 Normal              +---------+------------------+-----+-----------+--------+ +---------+------------------+-----+-----------+-------+ Left     Lt Pressure  (mmHg)IndexWaveform   Comment +---------+------------------+-----+-----------+-------+ Brachial 109                    triphasic          +---------+------------------+-----+-----------+-------+ PTA      127               1.08 multiphasic        +---------+------------------+-----+-----------+-------+ DP       132               1.12 multiphasic        +---------+------------------+-----+-----------+-------+ Great Toe92                0.78 Normal             +---------+------------------+-----+-----------+-------+ +-------+-----------+-----------+------------+------------+ ABI/TBIToday's ABIToday's TBIPrevious ABIPrevious TBI +-------+-----------+-----------+------------+------------+  Right  1.2                                            +-------+-----------+-----------+------------+------------+ Left   1.1                                            +-------+-----------+-----------+------------+------------+  Summary: Right: Resting right ankle-brachial index is within normal range. No evidence of significant right lower extremity arterial disease. The right toe-brachial index is abnormal. Left: Resting left ankle-brachial index is within normal range. No evidence of significant left lower extremity arterial disease. The left toe-brachial index is normal. *See table(s) above for measurements and observations.  Electronically signed by Orlie Pollen on 10/31/2021 at 3:35:31 PM.    Final    MR TIBIA FIBULA RIGHT WO CONTRAST  Result Date: 10/29/2021 CLINICAL DATA:  Right lateral leg pain and swelling concerning for abscess/myositis. Immunocompromised patient with sepsis. EXAM: MRI OF LOWER RIGHT EXTREMITY WITHOUT CONTRAST TECHNIQUE: Multiplanar, multisequence MR imaging of the right lower leg was performed. No intravenous contrast was administered. COMPARISON:  Radiographs 10/28/2021.  Ultrasound 10/24/2021. FINDINGS: Bones/Joint/Cartilage Both lower legs are included on  the coronal images. There is no evidence of acute fracture or dislocation. Tricompartmental degenerative changes are present in both knees. No evidence of bone marrow edema or bone destruction. No significant right knee or ankle joint effusion identified. Ligaments Not relevant for exam/indication. Muscles and Tendons There is mild muscular T2 hyperintensity throughout the right lower leg anterior and deep posterior compartments. No focal atrophy on the T1 weighted images. No evidence of focal fluid collection. The patellar tendon appears intact. The visualized ankle tendons are intact. Possible mild peroneal tenosynovitis. Soft tissues As seen on recent ultrasound, there is a large subcutaneous fluid collection laterally in right lower leg. This measures approximately 8.3 x 3.4 x 1.7 cm (8.4 x 4.5 x 1.3 cm on ultrasound). This collection demonstrates intermediate T1 and high T2 signal. No other focal fluid collections are identified. There is edema throughout the subcutaneous fat of the right lower leg which is asymmetric relative to the left lower leg. IMPRESSION: 1. Asymmetric edema throughout the subcutaneous tissues of the right lower leg which may reflect soft tissue infection (cellulitis). 2. Unchanged focal subcutaneous fluid collection laterally in the mid right lower leg which measures up to 8.3 cm, similar to recent ultrasound. This has a nonspecific appearance and could reflect a hematoma or subcutaneous abscess. Consider aspiration. 3. Nonspecific mild T2 hyperintensity throughout the muscles of the anterior and deep posterior compartments, which could reflect subacute denervation or myositis. No focal intramuscular fluid collection or tendon tear identified. Possible mild peroneal tenosynovitis. 4. No evidence of osteomyelitis or septic arthritis in the right lower leg. Electronically Signed   By: Richardean Sale M.D.   On: 10/29/2021 18:56   DG Tibia/Fibula Right  Result Date: 10/28/2021 CLINICAL  DATA:  Swelling of the right lower extremity EXAM: RIGHT TIBIA AND FIBULA - 2 VIEW COMPARISON:  None Available. FINDINGS: No acute displaced fracture or malalignment. Generalized edema within the subcutaneous soft tissues. Wound or ulcer along the medial aspect of the lower leg and slightly above the ankle. No soft tissue emphysema IMPRESSION: 1. No acute osseous abnormality 2. Marked soft tissue edema with suspected wound  or ulcer along the medial aspect of the lower leg Electronically Signed   By: Donavan Foil M.D.   On: 10/28/2021 23:54   US RENAL  Result Date: 10/28/2021 CLINICAL DATA:  Acute kidney injury EXAM: RENAL / URINARY TRACT ULTRASOUND COMPLETE COMPARISON:  None Available. FINDINGS: Right Kidney: Renal measurements: 10.3 x 4.9 x 5 cm = volume: 131 mL. Echogenicity within normal limits. No mass or hydronephrosis visualized. Left Kidney: Renal measurements: 9.4 x 5.6 x 5.4 cm = volume: 148 mL. Small cysts demonstrated along the midpole and medial midpole of the left kidney. Largest measures 1.5 cm in diameter. Appearance is consistent with benign cyst. No additional imaging follow-up is indicated. Echogenicity within normal limits. No solid mass or hydronephrosis visualized. Bladder: Appears normal for degree of bladder distention. Other: None. IMPRESSION: Benign-appearing left renal cysts. No hydronephrosis in the kidneys. Electronically Signed   By: Lucienne Capers M.D.   On: 10/28/2021 23:53   DG Chest Port 1 View  Result Date: 10/28/2021 CLINICAL DATA:  Abnormal laboratory values EXAM: PORTABLE CHEST 1 VIEW COMPARISON:  08/18/2016 FINDINGS: The heart size and mediastinal contours are within normal limits. Both lungs are clear. The visualized skeletal structures are unremarkable. IMPRESSION: No active disease. Electronically Signed   By: Donavan Foil M.D.   On: 10/28/2021 18:40   [place] RT LOWER EXTREM LTD SOFT TISSUE NON VASCULAR  Result Date: 10/25/2021 CLINICAL DATA:  Right leg tender  lump EXAM: ULTRASOUND right LOWER EXTREMITY LIMITED TECHNIQUE: Ultrasound examination of the lower extremity soft tissues was performed in the area of clinical concern. COMPARISON:  None Available. FINDINGS: Targeted sonography of the right lateral mid lower leg performed in the region of palpable mass. There is edema within the subcutaneous soft tissues. Within the subcutaneous soft tissues is a large complex fluid collection with internal mobile debris, this measures approximately 8.4 cm in length by 4.5 cm in with by 1.3 cm in AP thickness. Hypervascularity in the soft tissues surrounding the collection IMPRESSION: Large complex fluid collection within the subcutaneous soft tissues of the right lateral mid lower leg which could be due to abscess or potentially hematoma if there is history of trauma. Considerable edema and hyperemia of the surrounding soft tissues. Aspiration may be performed as clinically indicated. Electronically Signed   By: Donavan Foil M.D.   On: 10/25/2021 20:01    Microbiology: Results for orders placed or performed during the hospital encounter of 10/28/21  Blood Culture (routine x 2)     Status: None   Collection Time: 10/28/21  6:45 PM   Specimen: Right Antecubital; Blood  Result Value Ref Range Status   Specimen Description RIGHT ANTECUBITAL  Final   Special Requests   Final    BOTTLES DRAWN AEROBIC AND ANAEROBIC Blood Culture adequate volume   Culture   Final    NO GROWTH 5 DAYS Performed at [facility], [place] [address] [place], [place] [address]    Report Status 11/02/2021 FINAL  Final  MRSA Next Gen by PCR, Nasal     Status: None   Collection Time: 10/28/21 11:11 PM   Specimen: Nasal Mucosa; Nasal Swab  Result Value Ref Range Status   MRSA by PCR Next Gen NOT DETECTED NOT DETECTED Final    Comment: (NOTE) The GeneXpert MRSA Assay (FDA approved for NASAL specimens only), is one component of a comprehensive MRSA colonization surveillance program. It is  not intended to diagnose MRSA infection nor to guide or monitor treatment for  MRSA infections. Test performance is not FDA approved in patients less than 46 years old. Performed at [facility], [place] [address] [place], [place] [address]   Aerobic/Anaerobic Culture w Gram Stain (surgical/deep wound)     Status: None   Collection Time: 10/30/21  3:15 PM   Specimen: PATH Soft tissue  Result Value Ref Range Status   Specimen Description TISSUE RIGHT LEG  Final   Special Requests SAMPLE A PROXIMAL  Final   Gram Stain   Final    ABUNDANT WBC PRESENT, PREDOMINANTLY PMN MODERATE GRAM POSITIVE COCCI IN CLUSTERS    Culture   Final    RARE STAPHYLOCOCCUS AUREUS NO ANAEROBES ISOLATED Performed at [facility], [place] [address] [place], [place] [address]    Report Status 11/04/2021 FINAL  Final   Organism ID, Bacteria STAPHYLOCOCCUS AUREUS  Final      Susceptibility   Staphylococcus aureus - MIC*    CIPROFLOXACIN <=0.5 SENSITIVE Sensitive     ERYTHROMYCIN >=8 RESISTANT Resistant     GENTAMICIN <=0.5 SENSITIVE Sensitive     OXACILLIN 0.5 SENSITIVE Sensitive     TETRACYCLINE <=1 SENSITIVE Sensitive     VANCOMYCIN 1 SENSITIVE Sensitive     TRIMETH/SULFA <=10 SENSITIVE Sensitive     CLINDAMYCIN RESISTANT Resistant     RIFAMPIN <=0.5 SENSITIVE Sensitive     Inducible Clindamycin POSITIVE Resistant     * RARE STAPHYLOCOCCUS AUREUS  Aerobic/Anaerobic Culture w Gram Stain (surgical/deep wound)     Status: None   Collection Time: 10/30/21  3:19 PM   Specimen: PATH Soft tissue  Result Value Ref Range Status   Specimen Description TISSUE RIGHT LEG  Final   Special Requests SAMPLE B DISTAL  Final   Gram Stain   Final    ABUNDANT WBC PRESENT, PREDOMINANTLY PMN RARE GRAM POSITIVE COCCI    Culture   Final    RARE STAPHYLOCOCCUS AUREUS NO ANAEROBES ISOLATED Performed at [facility], [address] [place], [place] [address]    Report Status 11/04/2021 FINAL  Final    Organism ID, Bacteria STAPHYLOCOCCUS AUREUS  Final      Susceptibility   Staphylococcus aureus - MIC*    CIPROFLOXACIN 1 SENSITIVE Sensitive     ERYTHROMYCIN >=8 RESISTANT Resistant     GENTAMICIN <=0.5 SENSITIVE Sensitive     OXACILLIN 0.5 SENSITIVE Sensitive     TETRACYCLINE <=1 SENSITIVE Sensitive     VANCOMYCIN 1 SENSITIVE Sensitive     TRIMETH/SULFA <=10 SENSITIVE Sensitive     CLINDAMYCIN RESISTANT Resistant     RIFAMPIN <=0.5 SENSITIVE Sensitive     Inducible Clindamycin POSITIVE Resistant     * RARE STAPHYLOCOCCUS AUREUS    Labs: CBC: Recent Labs  Lab 10/31/21 0330 11/01/21 0450 11/01/21 1301 11/02/21 0239 11/03/21 0427 11/04/21 0256 11/05/21 0157  WBC 8.2 9.9  --  10.2 10.0 9.1 8.5  NEUTROABS 6.2 6.2  --  7.1  --   --   --   HGB 7.3* 6.6* 8.8* 7.5* 7.3* 7.6* 7.6*  HCT 21.6* 19.4* 26.0* 22.3* 21.5* 22.7* 22.6*  MCV 102.9* 102.6*  --  100.9* 100.0 101.8* 100.4*  PLT 156 173  --  190 197 209 742   Basic Metabolic Panel: Recent Labs  Lab 10/30/21 0450 10/31/21 0330 11/01/21 0450 11/01/21 1301 11/02/21 0239 11/03/21 0427 11/04/21 0256 11/05/21 0157  NA 141 136 139 139 139 140 140 140  K 4.0 4.2 4.0 4.8 4.3 3.4* 3.9 3.9  CL 113* 109 107 108 111 112* 110 111  CO2 '23 23 22  '$ --  '23 23 24 24  '$ GLUCOSE 122* 145* 108* 93 121* 91 93 104*  BUN '17 12 15 16 19 18 13 12  '$ CREATININE 1.06* 0.93 0.96 0.90 0.89 0.81 0.78 0.66  CALCIUM 8.0* 8.3* 8.6*  --  8.3* 8.1* 8.4* 8.2*  MG 1.3* 1.8 1.6*  --  1.5* 2.0  --   --   PHOS 3.1  --   --   --   --   --   --   --    Liver Function Tests: Recent Labs  Lab 11/03/21 0427 11/04/21 0256 11/05/21 0157  AST '28 27 29  '$ ALT '17 12 12  '$ ALKPHOS 66 65 71  BILITOT 0.3 0.3 0.6  PROT 4.5* 4.6* 4.8*  ALBUMIN 2.1* 2.1* 2.2*   CBG: No results for input(s): "GLUCAP" in the last 168 hours.  Discharge time spent: less than 30 minutes.  Signed: Marylu Lund, MD [facility] 11/05/2021

## 2021-11-06 ENCOUNTER — Ambulatory Visit (INDEPENDENT_AMBULATORY_CARE_PROVIDER_SITE_OTHER): Payer: BC Managed Care – PPO | Admitting: Family

## 2021-11-06 ENCOUNTER — Encounter: Payer: Self-pay | Admitting: Family

## 2021-11-06 ENCOUNTER — Telehealth: Payer: Self-pay

## 2021-11-06 DIAGNOSIS — L02415 Cutaneous abscess of right lower limb: Secondary | ICD-10-CM
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
## 2021-11-06 NOTE — Telephone Encounter (Signed)
Transition Care Management Unsuccessful Follow-up Telephone Call  Date of discharge and from where:  [facility]ica [facility] 11/05/2021  Attempts:  1st Attempt  Reason for unsuccessful TCM follow-up call:  No answer/busy

## 2021-11-07 NOTE — Telephone Encounter (Signed)
Transition Care Management Unsuccessful Follow-up Telephone Call  Date of discharge and from where:  Jess[facility] 11/05/2021  Attempts:  2nd Attempt  Reason for unsuccessful TCM follow-up call:  No answer/busy

## 2021-11-12 ENCOUNTER — Encounter: Payer: Self-pay | Admitting: Family

## 2021-11-12 ENCOUNTER — Ambulatory Visit (INDEPENDENT_AMBULATORY_CARE_PROVIDER_SITE_OTHER): Payer: BC Managed Care – PPO | Admitting: Family

## 2021-11-12 DIAGNOSIS — L02415 Cutaneous abscess of right lower limb: Secondary | ICD-10-CM

## 2021-11-12 DIAGNOSIS — I87333 Chronic venous hypertension (idiopathic) with ulcer and inflammation of bilateral lower extremity: Secondary | ICD-10-CM
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
## 2021-11-14 ENCOUNTER — Ambulatory Visit (INDEPENDENT_AMBULATORY_CARE_PROVIDER_SITE_OTHER): Payer: BC Managed Care – PPO | Admitting: Internal Medicine

## 2021-11-14 ENCOUNTER — Encounter: Payer: Self-pay | Admitting: Internal Medicine

## 2021-11-14 DIAGNOSIS — R609 Edema, unspecified: Secondary | ICD-10-CM

## 2021-11-14 DIAGNOSIS — I10 Essential (primary) hypertension: Secondary | ICD-10-CM | POA: Diagnosis not present

## 2021-11-14 DIAGNOSIS — M793 Panniculitis, unspecified: Secondary | ICD-10-CM | POA: Diagnosis not present

## 2021-11-14 DIAGNOSIS — N170 Acute kidney failure with tubular necrosis: Secondary | ICD-10-CM | POA: Diagnosis not present

## 2021-11-14 DIAGNOSIS — G4700 Insomnia, unspecified: Secondary | ICD-10-CM

## 2021-11-14 DIAGNOSIS — M159 Polyosteoarthritis, unspecified: Secondary | ICD-10-CM

## 2021-11-14 DIAGNOSIS — H44119 Panuveitis, unspecified eye: Secondary | ICD-10-CM | POA: Insufficient documentation

## 2021-11-14 LAB — CBC WITH DIFFERENTIAL/PLATELET
Basophils Absolute: 0 10*3/uL (ref 0.0–0.1)
Basophils Relative: 0.4 % (ref 0.0–3.0)
Eosinophils Absolute: 0 10*3/uL (ref 0.0–0.7)
Eosinophils Relative: 0.1 % (ref 0.0–5.0)
HCT: 26.3 % — ABNORMAL LOW (ref 36.0–46.0)
Hemoglobin: 8.7 g/dL — ABNORMAL LOW (ref 12.0–15.0)
Lymphocytes Relative: 28.2 % (ref 12.0–46.0)
Lymphs Abs: 2.4 10*3/uL (ref 0.7–4.0)
MCHC: 33.3 g/dL (ref 30.0–36.0)
MCV: 102.2 fl — ABNORMAL HIGH (ref 78.0–100.0)
Monocytes Absolute: 0.8 10*3/uL (ref 0.1–1.0)
Monocytes Relative: 9.1 % (ref 3.0–12.0)
Neutro Abs: 5.2 10*3/uL (ref 1.4–7.7)
Neutrophils Relative %: 62.2 % (ref 43.0–77.0)
Platelets: 330 10*3/uL (ref 150.0–400.0)
RBC: 2.57 Mil/uL — ABNORMAL LOW (ref 3.87–5.11)
RDW: 16.3 % — ABNORMAL HIGH (ref 11.5–15.5)
WBC: 8.4 10*3/uL (ref 4.0–10.5)

## 2021-11-14 LAB — COMPREHENSIVE METABOLIC PANEL
ALT: 23 U/L (ref 0–35)
AST: 27 U/L (ref 0–37)
Albumin: 3.5 g/dL (ref 3.5–5.2)
Alkaline Phosphatase: 79 U/L (ref 39–117)
BUN: 13 mg/dL (ref 6–23)
CO2: 26 mEq/L (ref 19–32)
Calcium: 9 mg/dL (ref 8.4–10.5)
Chloride: 107 mEq/L (ref 96–112)
Creatinine, Ser: 0.81 mg/dL (ref 0.40–1.20)
GFR: 79.26 mL/min (ref >60.00)
Glucose, Bld: 82 mg/dL (ref 70–99)
Potassium: 3.1 mEq/L — ABNORMAL LOW (ref 3.5–5.1)
Sodium: 142 mEq/L (ref 135–145)
Total Bilirubin: 0.7 mg/dL (ref 0.2–1.2)
Total Protein: 6.2 g/dL (ref 6.0–8.3)

## 2021-11-14 MED ORDER — ZOLPIDEM TARTRATE 10 MG PO TABS
10.0000 mg | ORAL_TABLET | Freq: Every evening | ORAL | 2 refills | Status: DC | PRN
Start: 2021-11-14 — End: 2022-03-27

## 2021-11-14 MED ORDER — HYDROCODONE-ACETAMINOPHEN 5-325 MG PO TABS: 1.0000 | ORAL_TABLET | Freq: Three times a day (TID) | ORAL | 0 refills | Status: DC | PRN

## 2021-11-14 NOTE — Progress Notes (Signed)
Subjective:  Patient ID: Jessica Lynn, female    DOB: 10-07-1960  Age: 61 y.o. MRN: [id number]  CC: No chief complaint on file.   HPI Jessica Lynn presents for vasculitis, leg wounds, CRF f/u C/o leg pain, insomnia  Per hx: " Admit date:     10/28/2021  Discharge date: 11/05/21  Discharge Physician: Marylu Lund    PCP: Cassandria Anger, MD    Recommendations at discharge:     Follow up with PCP in 1-2 weeks Follow up with Orthopedic Surgery as scheduled Please f/u with pt's prolonged steroid taper. Pt reported stopping high dose prednisone "cold [place]" and did not fill outpt taper rx, resulting in hypotension   Discharge Diagnoses: Principal Problem:   Severe sepsis ([place]) Active Problems:   Essential hypertension   Dyslipidemia   Uveitis of both eyes   Cellulitis   Hyponatremia   AKI (acute kidney injury) ([place])   Anemia   Acute kidney injury (AKI) with acute tubular necrosis (ATN) ([facility])   Necrotizing fasciitis ([place])   Abscess of right lower leg   Protein-calorie malnutrition, severe   Resolved Problems:   * No resolved hospital problems. *   Hospital Course: 61 year old with history of chronic bilateral extremity ulcers, retinal vasculitis presented to the emergency room with abnormal kidney functions called by her rheumatologist.  Patient is taken care of at wound care clinic and also followed by rheumatologist.  Recently treated with Humira and subsequently with prednisone that was tapered off.  She went to rheumatologist yesterday who did a routine blood test and found to have AKI, labs unavailable with previous normal renal functions so was asked to go to emergency room.  Recently taken Augmentin.  Seen at wound care clinic few days ago. Initially in the emergency room she was noted to be hypotensive requiring 4 L of fluid, blood cultures were drawn and she was started on antibiotics. Patient was found to have a pocket of infection on the right leg,  underwent incision debridement and wound VAC placement. Renal functions normalized.  She is revisit debridement today by Dr. Sharol Given.  Clinically improving.     Assessment and Plan: Severe sepsis present on admission: Secondary to right leg abscess.    Presented with hypotension, lactic acidosis and WBC count of 12.2.  Was on daptomycin.  Incision drainage and debridement of the skin subcutaneous tissue and fascia 7/26.  Redo surgery performed 7/28. Per ID, was transitioned to cefazolin through weekend and now on cefadroxil x 2 more weeks starting 7/31 to 8/14 Discharge home with portable wound VAC and on oral antibiotics.   Acute kidney injury: Prerenal in the setting of sepsis and hypotension.   Aggressively resuscitated.  Kidney functions normalized.   Patient was on losartan hydrochlorothiazide as well as Lasix at home. Renal ultrasound essentially normal.  Urine output is adequate. Renal functions normalized.  Will avoid losartan hydrochlorothiazide.  Essential hypertension: Blood pressures now low, see below  Hold all antihypertensives for now.   Hypotension: Pt reports previously being on extended course of high dose prednisone ('60mg'$  daily x 3 weeks, followed by '50mg'$  for at least one month, followed by prolonged taper). Pt did not like effects of high dosed steroids, thus essentially stopped taper prematurely and abruptly -SBP overnight dropped to the 80's -Orthostatics neg -BP improved with trial of hydrocortisone.  -discharged on lower dose of prednisone, anticipate prolonged taper given long course of prednisone prior to admit   Hypomagnesemia: corrected   Hypokalemia:  will replace   Anemia: Acute on chronic anemia.  Probably dilutional as well as surgical blood loss. S/p 1 unit PRBC Hgb seems stable Cont to follow           Consultants: Orthopedic Surgery, ID Procedures performed: DEBRIDEMENT RIGHT LEG  Disposition: Home"  Outpatient Medications Prior to Visit   Medication Sig Dispense Refill   cefadroxil (DURICEF) 500 MG capsule Take 2 capsules (1,000 mg total) by mouth 2 (two) times daily for 13 days. 52 capsule 0   Cholecalciferol (VITAMIN D3) 2000 units capsule Take 1 capsule (2,000 Units total) by mouth daily. 100 capsule 3   dorzolamide-timolol (COSOPT) 22.3-6.8 MG/ML ophthalmic solution Place 1 drop into the right eye 2 (two) times daily.     folic acid (FOLVITE) 1 MG tablet TAKE 1 TABLET(1 MG) BY MOUTH DAILY (Patient taking differently: Take 1 mg by mouth daily.) 100 tablet 3   hydrOXYzine (ATARAX/VISTARIL) 25 MG tablet TAKE 1 TABLET BY MOUTH EVERY 8 HOURS AS NEEDED FOR ITCHING (Patient taking differently: Take 25 mg by mouth daily as needed for itching.) 270 tablet 1   methocarbamol (ROBAXIN) 500 MG tablet Take 1 tablet (500 mg total) by mouth every 6 (six) hours as needed for muscle spasms. 20 tablet 0   montelukast (SINGULAIR) 10 MG tablet TAKE 1 TABLET(10 MG) BY MOUTH DAILY (Patient taking differently: Take 10 mg by mouth daily in the afternoon.) 90 tablet 3   naproxen (NAPROSYN) 500 MG tablet Take 500 mg by mouth daily as needed (swelling in knee).     prednisoLONE acetate (PRED FORTE) 1 % ophthalmic suspension Place 1 drop into both eyes 4 (four) times daily.     predniSONE (DELTASONE) 10 MG tablet Take 2 tablets (20 mg total) by mouth daily for 14 days. 28 tablet 0   SANTYL 250 UNIT/GM ointment Apply 1 Application topically daily.     Zinc Sulfate 220 (50 Zn) MG TABS Take 1 tablet (220 mg total) by mouth daily. 30 tablet 0   HYDROcodone-acetaminophen (NORCO/VICODIN) 5-325 MG tablet Take 1 tablet by mouth 3 (three) times daily as needed for severe pain. (Patient taking differently: Take 1 tablet by mouth 2 (two) times daily as needed for severe pain.) 90 tablet 0   zolpidem (AMBIEN) 10 MG tablet Take 1 tablet (10 mg total) by mouth at bedtime as needed for sleep. (Patient taking differently: Take 10 mg by mouth at bedtime.) 30 tablet 2    pantoprazole (PROTONIX) 40 MG tablet Take 1 tablet (40 mg total) by mouth daily. (Patient not taking: Reported on 10/17/2021) 30 tablet 5   pravastatin (PRAVACHOL) 20 MG tablet TAKE 1 TABLET(20 MG) BY MOUTH DAILY (Patient not taking: Reported on 10/17/2021) 90 tablet 3   No facility-administered medications prior to visit.    ROS: Review of Systems  Constitutional:  Positive for fatigue. Negative for activity change, appetite change, chills and unexpected weight change.  HENT:  Negative for congestion, mouth sores and sinus pressure.   Eyes:  Negative for visual disturbance.  Respiratory:  Negative for cough and chest tightness.   Cardiovascular:  Positive for leg swelling.  Gastrointestinal:  Negative for abdominal pain and nausea.  Genitourinary:  Negative for difficulty urinating, frequency and vaginal pain.  Musculoskeletal:  Positive for arthralgias, back pain and gait problem.  Skin:  Positive for color change and wound. Negative for pallor and rash.  Neurological:  Positive for weakness. Negative for dizziness, tremors, numbness and headaches.  Psychiatric/Behavioral:  Negative for confusion  and sleep disturbance.     Objective:  BP (!) 140/88 (BP Location: Left Arm, Patient Position: Sitting, Cuff Size: Normal)   Pulse 83   Temp 98.4 F (36.9 C) (Oral)   Ht '5\' 5"'$  (1.651 m)   Wt 202 lb (91.6 kg)   LMP 06/06/2015 Comment: irregular   SpO2 99%   BMI 33.61 kg/m   BP Readings from Last 3 Encounters:  11/14/21 (!) 140/88  11/05/21 119/82  10/17/21 118/70    Wt Readings from Last 3 Encounters:  11/14/21 202 lb (91.6 kg)  10/30/21 173 lb (78.5 kg)  10/17/21 173 lb (78.5 kg)    Physical Exam Constitutional:      General: She is not in acute distress.    Appearance: She is well-developed. She is obese.  HENT:     Head: Normocephalic.     Right Ear: External ear normal.     Left Ear: External ear normal.     Nose: Nose normal.  Eyes:     General:        Right eye:  No discharge.        Left eye: No discharge.     Conjunctiva/sclera: Conjunctivae normal.     Pupils: Pupils are equal, round, and reactive to light.  Neck:     Thyroid: No thyromegaly.     Vascular: No JVD.     Trachea: No tracheal deviation.  Cardiovascular:     Rate and Rhythm: Normal rate and regular rhythm.     Heart sounds: Normal heart sounds.  Pulmonary:     Effort: No respiratory distress.     Breath sounds: No stridor. No wheezing.  Abdominal:     General: Bowel sounds are normal. There is no distension.     Palpations: Abdomen is soft. There is no mass.     Tenderness: There is no abdominal tenderness. There is no guarding or rebound.  Musculoskeletal:        General: Swelling and tenderness present.     Cervical back: Normal range of motion and neck supple. No rigidity.     Right lower leg: Edema present.     Left lower leg: Edema present.  Lymphadenopathy:     Cervical: No cervical adenopathy.  Skin:    Findings: Lesion present. No erythema or rash.  Neurological:     Mental Status: She is oriented to person, place, and time.     Cranial Nerves: No cranial nerve deficit.     Motor: Weakness present. No abnormal muscle tone.     Coordination: Coordination normal.     Gait: Gait abnormal.     Deep Tendon Reflexes: Reflexes normal.  Psychiatric:        Behavior: Behavior normal.        Thought Content: Thought content normal.        Judgment: Judgment normal.   Legs w/edema, thighs too Legs are wrapped Using a walker  Lab Results  Component Value Date   WBC 8.5 11/05/2021   HGB 7.6 (L) 11/05/2021   HCT 22.6 (L) 11/05/2021   PLT 233 11/05/2021   GLUCOSE 104 (H) 11/05/2021   CHOL 148 12/16/2019   TRIG 71 12/16/2019   HDL 70 12/16/2019   LDLDIRECT 160.7 02/06/2012   LDLCALC 63 12/16/2019   ALT 12 11/05/2021   AST 29 11/05/2021   NA 140 11/05/2021   K 3.9 11/05/2021   CL 111 11/05/2021   CREATININE 0.66 11/05/2021   BUN 12 11/05/2021  CO2 24  11/05/2021   TSH 1.65 09/16/2021   INR 1.1 10/28/2021   HGBA1C 5.5 09/16/2021    MR TIBIA FIBULA RIGHT WO CONTRAST  Result Date: 10/29/2021 CLINICAL DATA:  Right lateral leg pain and swelling concerning for abscess/myositis. Immunocompromised patient with sepsis. EXAM: MRI OF LOWER RIGHT EXTREMITY WITHOUT CONTRAST TECHNIQUE: Multiplanar, multisequence MR imaging of the right lower leg was performed. No intravenous contrast was administered. COMPARISON:  Radiographs 10/28/2021.  Ultrasound 10/24/2021. FINDINGS: Bones/Joint/Cartilage Both lower legs are included on the coronal images. There is no evidence of acute fracture or dislocation. Tricompartmental degenerative changes are present in both knees. No evidence of bone marrow edema or bone destruction. No significant right knee or ankle joint effusion identified. Ligaments Not relevant for exam/indication. Muscles and Tendons There is mild muscular T2 hyperintensity throughout the right lower leg anterior and deep posterior compartments. No focal atrophy on the T1 weighted images. No evidence of focal fluid collection. The patellar tendon appears intact. The visualized ankle tendons are intact. Possible mild peroneal tenosynovitis. Soft tissues As seen on recent ultrasound, there is a large subcutaneous fluid collection laterally in right lower leg. This measures approximately 8.3 x 3.4 x 1.7 cm (8.4 x 4.5 x 1.3 cm on ultrasound). This collection demonstrates intermediate T1 and high T2 signal. No other focal fluid collections are identified. There is edema throughout the subcutaneous fat of the right lower leg which is asymmetric relative to the left lower leg. IMPRESSION: 1. Asymmetric edema throughout the subcutaneous tissues of the right lower leg which may reflect soft tissue infection (cellulitis). 2. Unchanged focal subcutaneous fluid collection laterally in the mid right lower leg which measures up to 8.3 cm, similar to recent ultrasound. This has a  nonspecific appearance and could reflect a hematoma or subcutaneous abscess. Consider aspiration. 3. Nonspecific mild T2 hyperintensity throughout the muscles of the anterior and deep posterior compartments, which could reflect subacute denervation or myositis. No focal intramuscular fluid collection or tendon tear identified. Possible mild peroneal tenosynovitis. 4. No evidence of osteomyelitis or septic arthritis in the right lower leg. Electronically Signed   By: Richardean Sale M.D.   On: 10/29/2021 18:56   DG Tibia/Fibula Right  Result Date: 10/28/2021 CLINICAL DATA:  Swelling of the right lower extremity EXAM: RIGHT TIBIA AND FIBULA - 2 VIEW COMPARISON:  None Available. FINDINGS: No acute displaced fracture or malalignment. Generalized edema within the subcutaneous soft tissues. Wound or ulcer along the medial aspect of the lower leg and slightly above the ankle. No soft tissue emphysema IMPRESSION: 1. No acute osseous abnormality 2. Marked soft tissue edema with suspected wound or ulcer along the medial aspect of the lower leg Electronically Signed   By: Donavan Foil M.D.   On: 10/28/2021 23:54   US RENAL  Result Date: 10/28/2021 CLINICAL DATA:  Acute kidney injury EXAM: RENAL / URINARY TRACT ULTRASOUND COMPLETE COMPARISON:  None Available. FINDINGS: Right Kidney: Renal measurements: 10.3 x 4.9 x 5 cm = volume: 131 mL. Echogenicity within normal limits. No mass or hydronephrosis visualized. Left Kidney: Renal measurements: 9.4 x 5.6 x 5.4 cm = volume: 148 mL. Small cysts demonstrated along the midpole and medial midpole of the left kidney. Largest measures 1.5 cm in diameter. Appearance is consistent with benign cyst. No additional imaging follow-up is indicated. Echogenicity within normal limits. No solid mass or hydronephrosis visualized. Bladder: Appears normal for degree of bladder distention. Other: None. IMPRESSION: Benign-appearing left renal cysts. No hydronephrosis in the kidneys.  Electronically Signed   By: Lucienne Capers M.D.   On: 10/28/2021 23:53   DG Chest Port 1 View  Result Date: 10/28/2021 CLINICAL DATA:  Abnormal laboratory values EXAM: PORTABLE CHEST 1 VIEW COMPARISON:  08/18/2016 FINDINGS: The heart size and mediastinal contours are within normal limits. Both lungs are clear. The visualized skeletal structures are unremarkable. IMPRESSION: No active disease. Electronically Signed   By: Donavan Foil M.D.   On: 10/28/2021 18:40    Assessment & Plan:   Problem List Items Addressed This Visit     Acute kidney injury (AKI) with acute tubular necrosis (ATN) ([place])    Hydrate well  Off BP meds Monitor GFR        Relevant Orders   Comprehensive metabolic panel   CBC with Differential/Platelet   Edema    Cont w/wrapping of B LEs      Essential hypertension    Off BP meds      Relevant Orders   Comprehensive metabolic panel   CBC with Differential/Platelet   Insomnia    Cont w/Zolpidem prn Not to take w/Norco  Potential benefits of a long term benzodiazepines  use as well as potential risks  and complications were explained to the patient and were aknowledged.      Primary osteoarthritis involving multiple joints   Relevant Medications   HYDROcodone-acetaminophen (NORCO/VICODIN) 5-325 MG tablet   Sclerosing panniculitis    Lower legs - bx Norco prn  Potential benefits of a long term opioids use as well as potential risks (i.e. addiction risk, apnea etc) and complications (i.e. Somnolence, constipation and others) were explained to the patient and were aknowledged.          Meds ordered this encounter  Medications   zolpidem (AMBIEN) 10 MG tablet    Sig: Take 1 tablet (10 mg total) by mouth at bedtime as needed for sleep.    Dispense:  30 tablet    Refill:  2   HYDROcodone-acetaminophen (NORCO/VICODIN) 5-325 MG tablet    Sig: Take 1 tablet by mouth 3 (three) times daily as needed for severe pain.    Dispense:  90 tablet    Refill:   0    Please fill on or after 11/25/21.  Office visit every 3 months      Follow-up: Return in about 2 months (around 01/14/2022) for a follow-up visit.  Walker Kehr, MD

## 2021-11-14 NOTE — Assessment & Plan Note (Signed)
Off BP meds 

## 2021-11-14 NOTE — Assessment & Plan Note (Signed)
Cont w/Zolpidem prn Not to take w/Norco  Potential benefits of a long term benzodiazepines  use as well as potential risks  and complications were explained to the patient and were aknowledged.

## 2021-11-14 NOTE — Assessment & Plan Note (Signed)
Cont w/wrapping of B LEs

## 2021-11-14 NOTE — Assessment & Plan Note (Signed)
Hydrate well  Off BP meds Monitor GFR

## 2021-11-14 NOTE — Assessment & Plan Note (Addendum)
Lower legs - bx Norco prn  Potential benefits of a long term opioids use as well as potential risks (i.e. addiction risk, apnea etc) and complications (i.e. Somnolence, constipation and others) were explained to the patient and were aknowledged.

## 2021-11-15 ENCOUNTER — Encounter: Payer: Self-pay | Admitting: Family

## 2021-11-15 ENCOUNTER — Ambulatory Visit (INDEPENDENT_AMBULATORY_CARE_PROVIDER_SITE_OTHER): Payer: BC Managed Care – PPO | Admitting: Family

## 2021-11-15 DIAGNOSIS — I87332 Chronic venous hypertension (idiopathic) with ulcer and inflammation of left lower extremity: Secondary | ICD-10-CM

## 2021-11-15 DIAGNOSIS — L02415 Cutaneous abscess of right lower limb: Secondary | ICD-10-CM

## 2021-11-16 ENCOUNTER — Other Ambulatory Visit: Payer: Self-pay | Admitting: Internal Medicine

## 2021-11-16 DIAGNOSIS — D649 Anemia, unspecified: Secondary | ICD-10-CM

## 2021-11-16 MED ORDER — POTASSIUM CHLORIDE CRYS ER 20 MEQ PO TBCR: 20.0000 meq | EXTENDED_RELEASE_TABLET | Freq: Every day | ORAL | 0 refills | Status: DC
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
## 2021-11-21 ENCOUNTER — Ambulatory Visit (INDEPENDENT_AMBULATORY_CARE_PROVIDER_SITE_OTHER): Payer: BC Managed Care – PPO | Admitting: Orthopedic Surgery

## 2021-11-21 ENCOUNTER — Encounter: Payer: Self-pay | Admitting: Orthopedic Surgery

## 2021-11-21 DIAGNOSIS — L02415 Cutaneous abscess of right lower limb: Secondary | ICD-10-CM | POA: Diagnosis not present

## 2021-11-21 DIAGNOSIS — I87332 Chronic venous hypertension (idiopathic) with ulcer and inflammation of left lower extremity: Secondary | ICD-10-CM

## 2021-11-21 DIAGNOSIS — I87333 Chronic venous hypertension (idiopathic) with ulcer and inflammation of bilateral lower extremity: Secondary | ICD-10-CM

## 2021-11-21 DIAGNOSIS — I890 Lymphedema, not elsewhere classified: Secondary | ICD-10-CM

## 2021-11-21 MED ORDER — PREDNISONE 10 MG PO TABS: 20.0000 mg | ORAL_TABLET | Freq: Every day | ORAL | 0 refills | Status: DC
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
## 2021-11-25 ENCOUNTER — Encounter: Payer: Self-pay | Admitting: Orthopedic Surgery

## 2021-11-25 ENCOUNTER — Ambulatory Visit (INDEPENDENT_AMBULATORY_CARE_PROVIDER_SITE_OTHER): Payer: BC Managed Care – PPO | Admitting: Orthopedic Surgery

## 2021-11-25 DIAGNOSIS — I87333 Chronic venous hypertension (idiopathic) with ulcer and inflammation of bilateral lower extremity: Secondary | ICD-10-CM

## 2021-11-25 DIAGNOSIS — L02415 Cutaneous abscess of right lower limb: Secondary | ICD-10-CM | POA: Diagnosis not present

## 2021-11-25 DIAGNOSIS — I890 Lymphedema, not elsewhere classified: Secondary | ICD-10-CM
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
## 2021-11-26 ENCOUNTER — Other Ambulatory Visit: Payer: Self-pay | Admitting: Internal Medicine

## 2021-11-28 ENCOUNTER — Ambulatory Visit: Payer: BC Managed Care – PPO | Admitting: Orthopedic Surgery

## 2021-11-28 ENCOUNTER — Ambulatory Visit (INDEPENDENT_AMBULATORY_CARE_PROVIDER_SITE_OTHER): Payer: BC Managed Care – PPO | Admitting: Orthopedic Surgery

## 2021-11-28 DIAGNOSIS — L02415 Cutaneous abscess of right lower limb: Secondary | ICD-10-CM | POA: Diagnosis not present

## 2021-11-28 DIAGNOSIS — I87333 Chronic venous hypertension (idiopathic) with ulcer and inflammation of bilateral lower extremity: Secondary | ICD-10-CM | POA: Diagnosis not present

## 2021-11-30 ENCOUNTER — Other Ambulatory Visit: Payer: Self-pay | Admitting: Internal Medicine

## 2021-12-02 ENCOUNTER — Telehealth: Payer: Self-pay | Admitting: Orthopedic Surgery

## 2021-12-02 NOTE — Telephone Encounter (Signed)
Pt called about a leave of absence note for work.

## 2021-12-02 NOTE — Telephone Encounter (Signed)
Can you please call pt and let her know that her forms have been done and Dr. Sharol Given has signed. We can fax this for her. Will she need a note in addition to this? ( I have the paperwork in the signature folder

## 2021-12-03 NOTE — Telephone Encounter (Signed)
Pt informed. We will fax today and she will pick up original paperwork to have on file when she comes in for appt on Thursday.

## 2021-12-05 ENCOUNTER — Ambulatory Visit (INDEPENDENT_AMBULATORY_CARE_PROVIDER_SITE_OTHER): Payer: BC Managed Care – PPO | Admitting: Orthopedic Surgery

## 2021-12-05 DIAGNOSIS — L02415 Cutaneous abscess of right lower limb: Secondary | ICD-10-CM | POA: Diagnosis not present

## 2021-12-07 ENCOUNTER — Inpatient Hospital Stay (HOSPITAL_COMMUNITY)
Admission: EM | Admit: 2021-12-07 | Discharge: 2021-12-13 | DRG: 872 | Disposition: A | Discharge status: Home Health | Payer: BC Managed Care – PPO | Source: Ambulatory Visit | Attending: Internal Medicine | Admitting: Internal Medicine

## 2021-12-07 ENCOUNTER — Encounter (HOSPITAL_COMMUNITY): Payer: Self-pay | Admitting: Emergency Medicine

## 2021-12-07 ENCOUNTER — Observation Stay (HOSPITAL_COMMUNITY): Payer: BC Managed Care – PPO

## 2021-12-07 ENCOUNTER — Emergency Department (HOSPITAL_COMMUNITY): Payer: BC Managed Care – PPO

## 2021-12-07 ENCOUNTER — Encounter (HOSPITAL_COMMUNITY): Payer: Self-pay | Admitting: *Deleted

## 2021-12-07 ENCOUNTER — Ambulatory Visit (HOSPITAL_COMMUNITY)
Admission: EM | Admit: 2021-12-07 | Discharge: 2021-12-07 | Disposition: A | Discharge status: Home / Self Care | Payer: BC Managed Care – PPO | Attending: Internal Medicine | Admitting: Internal Medicine

## 2021-12-07 ENCOUNTER — Other Ambulatory Visit: Payer: Self-pay

## 2021-12-07 DIAGNOSIS — R6520 Severe sepsis without septic shock: Secondary | ICD-10-CM | POA: Diagnosis present

## 2021-12-07 DIAGNOSIS — N179 Acute kidney failure, unspecified: Secondary | ICD-10-CM | POA: Diagnosis not present

## 2021-12-07 DIAGNOSIS — M726 Necrotizing fasciitis: Secondary | ICD-10-CM | POA: Diagnosis not present

## 2021-12-07 DIAGNOSIS — R350 Frequency of micturition: Secondary | ICD-10-CM | POA: Diagnosis not present

## 2021-12-07 DIAGNOSIS — E669 Obesity, unspecified: Secondary | ICD-10-CM | POA: Diagnosis present

## 2021-12-07 DIAGNOSIS — Z8371 Family history of colonic polyps: Secondary | ICD-10-CM

## 2021-12-07 DIAGNOSIS — R509 Fever, unspecified: Secondary | ICD-10-CM | POA: Diagnosis not present

## 2021-12-07 DIAGNOSIS — L03115 Cellulitis of right lower limb: Secondary | ICD-10-CM | POA: Diagnosis present

## 2021-12-07 DIAGNOSIS — Z6835 Body mass index (BMI) 35.0-35.9, adult: Secondary | ICD-10-CM

## 2021-12-07 DIAGNOSIS — Z885 Allergy status to narcotic agent status: Secondary | ICD-10-CM

## 2021-12-07 DIAGNOSIS — I10 Essential (primary) hypertension: Secondary | ICD-10-CM | POA: Diagnosis present

## 2021-12-07 DIAGNOSIS — Z87891 Personal history of nicotine dependence: Secondary | ICD-10-CM

## 2021-12-07 DIAGNOSIS — D649 Anemia, unspecified: Secondary | ICD-10-CM | POA: Diagnosis present

## 2021-12-07 DIAGNOSIS — R531 Weakness: Secondary | ICD-10-CM | POA: Diagnosis not present

## 2021-12-07 DIAGNOSIS — E876 Hypokalemia: Secondary | ICD-10-CM

## 2021-12-07 DIAGNOSIS — B0229 Other postherpetic nervous system involvement: Secondary | ICD-10-CM | POA: Diagnosis present

## 2021-12-07 DIAGNOSIS — D696 Thrombocytopenia, unspecified: Secondary | ICD-10-CM

## 2021-12-07 DIAGNOSIS — K219 Gastro-esophageal reflux disease without esophagitis: Secondary | ICD-10-CM | POA: Diagnosis present

## 2021-12-07 DIAGNOSIS — A419 Sepsis, unspecified organism: Principal | ICD-10-CM | POA: Diagnosis present

## 2021-12-07 DIAGNOSIS — A4151 Sepsis due to Escherichia coli [E. coli]: Secondary | ICD-10-CM | POA: Diagnosis not present

## 2021-12-07 DIAGNOSIS — Z20822 Contact with and (suspected) exposure to covid-19: Secondary | ICD-10-CM | POA: Diagnosis present

## 2021-12-07 DIAGNOSIS — Z7952 Long term (current) use of systemic steroids: Secondary | ICD-10-CM

## 2021-12-07 DIAGNOSIS — Z800 Family history of malignant neoplasm of digestive organs: Secondary | ICD-10-CM

## 2021-12-07 DIAGNOSIS — Z888 Allergy status to other drugs, medicaments and biological substances status: Secondary | ICD-10-CM

## 2021-12-07 DIAGNOSIS — S81801A Unspecified open wound, right lower leg, initial encounter: Secondary | ICD-10-CM

## 2021-12-07 DIAGNOSIS — E785 Hyperlipidemia, unspecified: Secondary | ICD-10-CM | POA: Diagnosis present

## 2021-12-07 DIAGNOSIS — N390 Urinary tract infection, site not specified: Secondary | ICD-10-CM

## 2021-12-07 LAB — COMPREHENSIVE METABOLIC PANEL
ALT: 15 U/L (ref 0–44)
AST: 26 U/L (ref 15–41)
Albumin: 2.3 g/dL — ABNORMAL LOW (ref 3.5–5.0)
Alkaline Phosphatase: 159 U/L — ABNORMAL HIGH (ref 38–126)
Anion gap: 7 (ref 5–15)
BUN: 22 mg/dL — ABNORMAL HIGH (ref 6–20)
CO2: 23 mmol/L (ref 22–32)
Calcium: 8.4 mg/dL — ABNORMAL LOW (ref 8.9–10.3)
Chloride: 108 mmol/L (ref 98–111)
Creatinine, Ser: 1.8 mg/dL — ABNORMAL HIGH (ref 0.44–1.00)
GFR, Estimated: 32 mL/min — ABNORMAL LOW (ref >60)
Glucose, Bld: 99 mg/dL (ref 70–99)
Potassium: 2.9 mmol/L — ABNORMAL LOW (ref 3.5–5.1)
Sodium: 138 mmol/L (ref 135–145)
Total Bilirubin: 1.6 mg/dL — ABNORMAL HIGH (ref 0.3–1.2)
Total Protein: 5.2 g/dL — ABNORMAL LOW (ref 6.5–8.1)

## 2021-12-07 LAB — URINALYSIS, ROUTINE W REFLEX MICROSCOPIC
Bilirubin Urine: NEGATIVE
Glucose, UA: NEGATIVE mg/dL
Ketones, ur: NEGATIVE mg/dL
Nitrite: POSITIVE — AB
Protein, ur: 100 mg/dL — AB
Specific Gravity, Urine: 1.01 (ref 1.005–1.030)
WBC, UA: >50 WBC/hpf — ABNORMAL HIGH (ref 0–5)
pH: 6 (ref 5.0–8.0)

## 2021-12-07 LAB — CBC WITH DIFFERENTIAL/PLATELET
Abs Immature Granulocytes: 0.18 10*3/uL — ABNORMAL HIGH (ref 0.00–0.07)
Basophils Absolute: 0.1 10*3/uL (ref 0.0–0.1)
Basophils Relative: 0 %
Eosinophils Absolute: 0.1 10*3/uL (ref 0.0–0.5)
Eosinophils Relative: 0 %
HCT: 28.4 % — ABNORMAL LOW (ref 36.0–46.0)
Hemoglobin: 9.7 g/dL — ABNORMAL LOW (ref 12.0–15.0)
Immature Granulocytes: 1 %
Lymphocytes Relative: 9 %
Lymphs Abs: 2.6 10*3/uL (ref 0.7–4.0)
MCH: 32.2 pg (ref 26.0–34.0)
MCHC: 34.2 g/dL (ref 30.0–36.0)
MCV: 94.4 fL (ref 80.0–100.0)
Monocytes Absolute: 3.1 10*3/uL — ABNORMAL HIGH (ref 0.1–1.0)
Monocytes Relative: 11 %
Neutro Abs: 22 10*3/uL — ABNORMAL HIGH (ref 1.7–7.7)
Neutrophils Relative %: 79 %
Platelets: 70 10*3/uL — ABNORMAL LOW (ref 150–400)
RBC: 3.01 MIL/uL — ABNORMAL LOW (ref 3.87–5.11)
RDW: 15.3 % (ref 11.5–15.5)
WBC: 27.9 10*3/uL — ABNORMAL HIGH (ref 4.0–10.5)
nRBC: 0 % (ref 0.0–0.2)

## 2021-12-07 LAB — PROTIME-INR
INR: 1.4 — ABNORMAL HIGH (ref 0.8–1.2)
Prothrombin Time: 17.1 seconds — ABNORMAL HIGH (ref 11.4–15.2)

## 2021-12-07 LAB — C-REACTIVE PROTEIN: CRP: 14.2 mg/dL — ABNORMAL HIGH (ref <1.0)

## 2021-12-07 LAB — MAGNESIUM: Magnesium: 1.6 mg/dL — ABNORMAL LOW (ref 1.7–2.4)

## 2021-12-07 LAB — SEDIMENTATION RATE: Sed Rate: 45 mm/hr — ABNORMAL HIGH (ref 0–22)

## 2021-12-07 LAB — LACTIC ACID, PLASMA: Lactic Acid, Venous: 1.8 mmol/L (ref 0.5–1.9)

## 2021-12-07 MED ORDER — VANCOMYCIN HCL IN DEXTROSE 1-5 GM/200ML-% IV SOLN
1000.0000 mg | Freq: Once | INTRAVENOUS | Status: AC
Start: 2021-12-07 — End: 2021-12-07
  Administered 2021-12-07: 1000 mg via INTRAVENOUS
  Filled 2021-12-07: qty 200

## 2021-12-07 MED ORDER — LORAZEPAM 0.5 MG PO TABS: 0.5000 mg | ORAL_TABLET | ORAL | Status: DC | PRN

## 2021-12-07 MED ORDER — LINEZOLID 600 MG/300ML IV SOLN
600.0000 mg | Freq: Two times a day (BID) | INTRAVENOUS | Status: DC
  Administered 2021-12-07: 600 mg via INTRAVENOUS
  Filled 2021-12-07: qty 300

## 2021-12-07 MED ORDER — ACETAMINOPHEN 325 MG PO TABS
ORAL_TABLET | ORAL | Status: AC
  Filled 2021-12-07: qty 3

## 2021-12-07 MED ORDER — LORAZEPAM 2 MG/ML IJ SOLN: 0.5000 mg | INTRAMUSCULAR | Status: DC | PRN

## 2021-12-07 MED ORDER — CEFEPIME HCL 2 G IV SOLR
2.0000 g | Freq: Once | INTRAVENOUS | Status: AC
Start: 2021-12-07 — End: 2021-12-07
  Administered 2021-12-07: 2 g via INTRAVENOUS
  Filled 2021-12-07: qty 12.5

## 2021-12-07 MED ORDER — POTASSIUM CHLORIDE CRYS ER 20 MEQ PO TBCR
40.0000 meq | EXTENDED_RELEASE_TABLET | Freq: Once | ORAL | Status: DC
  Filled 2021-12-07: qty 2

## 2021-12-07 MED ORDER — VANCOMYCIN HCL IN DEXTROSE 1-5 GM/200ML-% IV SOLN
1000.0000 mg | Freq: Once | INTRAVENOUS | Status: AC
  Administered 2021-12-08: 1000 mg via INTRAVENOUS
  Filled 2021-12-07: qty 200

## 2021-12-07 MED ORDER — MAGNESIUM SULFATE 2 GM/50ML IV SOLN
2.0000 g | Freq: Once | INTRAVENOUS | Status: AC
  Administered 2021-12-08: 2 g via INTRAVENOUS
  Filled 2021-12-07: qty 50

## 2021-12-07 MED ORDER — LACTATED RINGERS IV BOLUS (SEPSIS)
1000.0000 mL | Freq: Once | INTRAVENOUS | Status: AC
  Administered 2021-12-07: 1000 mL via INTRAVENOUS

## 2021-12-07 MED ORDER — ACETAMINOPHEN 325 MG PO TABS
975.0000 mg | ORAL_TABLET | Freq: Once | ORAL | Status: AC
  Administered 2021-12-07: 975 mg via ORAL

## 2021-12-07 MED ORDER — LACTATED RINGERS IV SOLN: INTRAVENOUS | Status: DC

## 2021-12-07 MED ORDER — POTASSIUM CHLORIDE 10 MEQ/100ML IV SOLN
10.0000 meq | INTRAVENOUS | Status: AC
  Administered 2021-12-07 – 2021-12-08 (×2): 10 meq via INTRAVENOUS
  Filled 2021-12-07 (×2): qty 100

## 2021-12-07 MED ORDER — LORAZEPAM 2 MG/ML IJ SOLN: 1.0000 mg | INTRAMUSCULAR | Status: DC | PRN

## 2021-12-07 MED ORDER — SODIUM CHLORIDE 0.9 % IV SOLN: 2.0000 g | INTRAVENOUS | Status: DC

## 2021-12-07 NOTE — Discharge Instructions (Signed)
Please go to the nearest emergency department for further evaluation.  

## 2021-12-07 NOTE — Progress Notes (Addendum)
Patient seen in ED tonight for concern of nec fasc. BLE wounds are stable compared to prior clinical photos on 11/25/21.  I don't appreciate any cellulitis, induration, fluctuance, soft tissue crepitus or drainage from the wounds.  Compartments are soft. Suspect urosepsis as more likely source of infection and AMS.   Agree with broad spectrum abx and MRI to r/o deep infection.   Continue wet to dry dressings to the wounds BID. Will see patient again tomorrow on rounds.  Azucena Cecil, MD [facility] 10:53 PM

## 2021-12-07 NOTE — Sepsis Progress Note (Signed)
Notified bedside nurse of need to administer antibiotics. Awaiting IV team to place PIV. Will continue to monitor code sepsis.

## 2021-12-07 NOTE — Sepsis Progress Note (Signed)
[facility] following code sepsis °

## 2021-12-07 NOTE — H&P (Addendum)
History and Physical    Jessica Lynn IRC:[id number] DOB: 1960/09/14 DOA: 12/07/2021  DOS: the patient was seen and examined on 12/07/2021  PCP: Plotnikov, Evie Lacks, MD   Patient coming from: Home  I have personally briefly reviewed patient's old medical records in [place]  Chief complaint: Lethargy History present illness: 61 year old African-American female history of hypertension, diagnosis of necrotizing fasciitis secondary to Staph aureus in her right leg in July 2023 presents the ER today with a 4-day history of lethargy, altered mental status.  Daughter is at the bedside.  She states the patient's been sleeping a lot for the last 4 days.  Barely getting out of bed.  Is been eating and drinking very little.  She sleeps all the time according the daughter.  Patient had a fever today.  Was brought to urgent care and sent to the ER for evaluation.  Patient denies any dysuria.  She has had some urinary incontinence but the daughter thinks that this is due to the patient moving slow to the bathroom.  Patient denies any nausea, vomiting.  No shortness of breath or chest pain.  No abdominal pain.  No flank pain.  She denies any dysuria.  On arrival to the ER, temp 101.3 heart rate 107 blood pressure 129/83.  White count 27.9, hemoglobin 9.7, platelets 70,000  Daughter states that she has been having patient's legs wrapped since discharge.  They continue to drain.  Right tib-fib x-ray showed soft tissue ulceration and subcutaneous edema.  No evidence of osteomyelitis.  There is no fractures.  Chest x-ray was negative for pulm edema.  There is subsegmental atelectasis.  Orthopedics has been consulted.  [facility] hospitalist contacted for admission.  Patient's initial urine sample was obtained from bedside bedpan.  Straight cath UA sample and culture were requested.   ED Course: White count 27.9.  Febrile.  Tachycardic.  Chest x-ray negative.  Review of Systems:  Review of  Systems  Constitutional:  Positive for fever and malaise/fatigue.  HENT: Negative.    Eyes: Negative.   Respiratory: Negative.    Cardiovascular: Negative.   Gastrointestinal: Negative.  Negative for abdominal pain, nausea and vomiting.  Genitourinary: Negative.  Negative for dysuria and urgency.  Musculoskeletal: Negative.   Skin:        Continue drainage of her bilateral leg wounds.  Neurological: Negative.   Endo/Heme/Allergies:        Anorexia.  Psychiatric/Behavioral: Negative.    All other systems reviewed and are negative.   Past Medical History:  Diagnosis Date   Acute kidney injury (AKI) with acute tubular necrosis (ATN) ([place]) 10/29/2021   Hydrate well  Off BP meds Monitor GFR    Allergic rhinitis    year around    Allergy    Atypical chest pain    GERD (gastroesophageal reflux disease)    Hypercholesterolemia    Mild hypertension    Obesity    Postherpetic neuralgia    from shingles   Shingles    Snoring    Somatic dysfunction     Past Surgical History:  Procedure Laterality Date   DILATION AND CURETTAGE OF UTERUS     x2 w/ miscarriages   I & D EXTREMITY Right 10/30/2021   Procedure: IRRIGATION AND DEBRIDEMENT OF LEG;  Surgeon: [name], MD;  Location: [facility];  Service: Orthopedics;  Laterality: Right;   I & D EXTREMITY Right 11/01/2021   Procedure: DEBRIDEMENT RIGHT LEG, WOUND VAC CHANGE;  Surgeon: Meridee Score  V, MD;  Location: [facility];  Service: Orthopedics;  Laterality: Right;   TUBAL LIGATION     VAGINAL DELIVERY     x2     reports that she quit smoking about 15 years ago. Her smoking use included cigarettes. She has a 0.80 pack-year smoking history. She has never used smokeless tobacco. She reports that she does not drink alcohol and does not use drugs.  Allergies  Allergen Reactions   Crestor [Rosuvastatin] Other (See Comments)    cramps   Oxycodone Itching    Family History  Problem Relation Age of Onset   Colon cancer Brother 46   Colon  polyps Mother    Colon polyps Father    Colon cancer Maternal Uncle    Esophageal cancer Neg Hx    Rectal cancer Neg Hx    Stomach cancer Neg Hx     Prior to Admission medications   Medication Sig Start Date End Date Taking? Authorizing Provider  Cholecalciferol (VITAMIN D3) 2000 units capsule Take 1 capsule (2,000 Units total) by mouth daily. 12/15/16   Plotnikov, Evie Lacks, MD  dorzolamide-timolol (COSOPT) 22.3-6.8 MG/ML ophthalmic solution Place 1 drop into the right eye 2 (two) times daily. 11/20/20   [provider]  folic acid (FOLVITE) 1 MG tablet TAKE 1 TABLET(1 MG) BY MOUTH DAILY Patient taking differently: Take 1 mg by mouth daily. 05/20/21   Plotnikov, Evie Lacks, MD  HYDROcodone-acetaminophen (NORCO/VICODIN) 5-325 MG tablet Take 1 tablet by mouth 3 (three) times daily as needed for severe pain. 11/14/21 11/14/22  Plotnikov, Evie Lacks, MD  hydrOXYzine (ATARAX/VISTARIL) 25 MG tablet TAKE 1 TABLET BY MOUTH EVERY 8 HOURS AS NEEDED FOR ITCHING Patient taking differently: Take 25 mg by mouth daily as needed for itching. 11/30/20   Plotnikov, Evie Lacks, MD  methocarbamol (ROBAXIN) 500 MG tablet Take 1 tablet (500 mg total) by mouth every 6 (six) hours as needed for muscle spasms. 11/05/21   Donne Hazel, MD  montelukast (SINGULAIR) 10 MG tablet TAKE 1 TABLET(10 MG) BY MOUTH DAILY Patient taking differently: Take 10 mg by mouth daily in the afternoon. 12/24/20   Plotnikov, Evie Lacks, MD  naproxen (NAPROSYN) 500 MG tablet TAKE 1 TABLET BY MOUTH TWICE DAILY WITH FOOD FOR MODERATE PAIN 12/02/21   Plotnikov, Evie Lacks, MD  potassium chloride SA (KLOR-CON M) 20 MEQ tablet Take 1 tablet (20 mEq total) by mouth daily. 11/16/21   Plotnikov, Evie Lacks, MD  prednisoLONE acetate (PRED FORTE) 1 % ophthalmic suspension Place 1 drop into both eyes 4 (four) times daily. 01/20/21   [provider]  predniSONE (DELTASONE) 10 MG tablet Take 2 tablets (20 mg total) by mouth daily with breakfast.  11/21/21   [name], MD  SANTYL 250 UNIT/GM ointment Apply 1 Application topically daily. 10/16/21   [provider]  Zinc Sulfate 220 (50 Zn) MG TABS Take 1 tablet (220 mg total) by mouth daily. 11/06/21   Donne Hazel, MD  zolpidem (AMBIEN) 10 MG tablet Take 1 tablet (10 mg total) by mouth at bedtime as needed for sleep. 11/14/21   Plotnikov, Evie Lacks, MD    Physical Exam: Vitals:   12/07/21 2115 12/07/21 2130 12/07/21 2145 12/07/21 2230  BP: 117/81 124/76 120/79 (!) 135/95  Pulse: 87 89 87 (!) 101  Resp: (!) 23 (!) 24 (!) 24 19  Temp:      TempSrc:      SpO2: 99% 99% 99% 99%  Weight:  Height:        Physical Exam Constitutional:      General: She is not in acute distress.    Appearance: Normal appearance. She is not toxic-appearing or diaphoretic.     Comments: Chronically ill-appearing.  HENT:     Head: Normocephalic and atraumatic.     Nose: Nose normal.  Eyes:     General: No scleral icterus. Cardiovascular:     Rate and Rhythm: Normal rate and regular rhythm.  Pulmonary:     Effort: Pulmonary effort is normal. No respiratory distress.     Breath sounds: No wheezing or rales.  Abdominal:     General: Bowel sounds are normal. There is no distension.     Tenderness: There is no abdominal tenderness. There is no guarding.  Skin:    Capillary Refill: Capillary refill takes less than 2 seconds.     Comments: Bilateral legs wounds. See pictures No purulent drainage from either leg  Neurological:     Mental Status: She is alert and oriented to person, place, and time.         Labs on Admission: I have personally reviewed following labs and imaging studies  CBC: Recent Labs  Lab 12/07/21 1743  WBC 27.9*  NEUTROABS 22.0*  HGB 9.7*  HCT 28.4*  MCV 94.4  PLT 70*   Basic Metabolic Panel: Recent Labs  Lab 12/07/21 1743 12/07/21 2016  NA 138  --   K 2.9*  --   CL 108  --   CO2 23  --   GLUCOSE 99  --   BUN 22*  --   CREATININE 1.80*   --   CALCIUM 8.4*  --   MG  --  1.6*   GFR: Estimated Creatinine Clearance: 37.6 mL/min (A) (by C-G formula based on SCr of 1.8 mg/dL (H)). Liver Function Tests: Recent Labs  Lab 12/07/21 1743  AST 26  ALT 15  ALKPHOS 159*  BILITOT 1.6*  PROT 5.2*  ALBUMIN 2.3*   No results for input(s): "LIPASE", "AMYLASE" in the last 168 hours. No results for input(s): "AMMONIA" in the last 168 hours. Coagulation Profile: Recent Labs  Lab 12/07/21 1743  INR 1.4*   Cardiac Enzymes: No results for input(s): "CKTOTAL", "CKMB", "CKMBINDEX", "TROPONINI", "TROPONINIHS" in the last 168 hours. BNP (last 3 results) No results for input(s): "PROBNP" in the last 8760 hours. HbA1C: No results for input(s): "HGBA1C" in the last 72 hours. CBG: No results for input(s): "GLUCAP" in the last 168 hours. Lipid Profile: No results for input(s): "CHOL", "HDL", "LDLCALC", "TRIG", "CHOLHDL", "LDLDIRECT" in the last 72 hours. Thyroid Function Tests: No results for input(s): "TSH", "T4TOTAL", "FREET4", "T3FREE", "THYROIDAB" in the last 72 hours. Anemia Panel: No results for input(s): "VITAMINB12", "FOLATE", "FERRITIN", "TIBC", "IRON", "RETICCTPCT" in the last 72 hours. Urine analysis:    Component Value Date/Time   COLORURINE YELLOW 12/07/2021 2159   APPEARANCEUR CLOUDY (A) 12/07/2021 2159   LABSPEC 1.010 12/07/2021 2159   PHURINE 6.0 12/07/2021 2159   GLUCOSEU NEGATIVE 12/07/2021 2159   GLUCOSEU NEGATIVE 09/05/2020 0937   HGBUR MODERATE (A) 12/07/2021 2159   BILIRUBINUR NEGATIVE 12/07/2021 2159   [place] NEGATIVE 12/07/2021 2159   PROTEINUR 100 (A) 12/07/2021 2159   UROBILINOGEN 0.2 09/05/2020 0937   NITRITE POSITIVE (A) 12/07/2021 2159   LEUKOCYTESUR LARGE (A) 12/07/2021 2159    Radiological Exams on Admission: I have personally reviewed images DG Tibia/Fibula Right  Result Date: 12/07/2021 CLINICAL DATA:  Chronic right lower extremity wound  EXAM: RIGHT TIBIA AND FIBULA - 2 VIEW COMPARISON:   None Available. FINDINGS: Normal alignment. No acute fracture or dislocation. No osseous erosions or abnormal periosteal reaction. There is diffuse subcutaneous edema involving the right lower extremity. Shallow ulcer noted along the medial aspect of the right lower extremity at the level of the mid diaphysis of the tibia and fibula. IMPRESSION: Soft tissue ulcer and diffuse subcutaneous edema. No radiographic evidence of osteomyelitis. Electronically Signed   By: Fidela Salisbury M.D.   On: 12/07/2021 20:05   DG Chest 2 View  Result Date: 12/07/2021 CLINICAL DATA:  Possible sepsis EXAM: CHEST - 2 VIEW COMPARISON:  10/28/2021 FINDINGS: Transverse diameter of heart is increased. This may be partly due to poor inspiration. There are no signs of alveolar pulmonary edema. Linear densities are seen in medial left lower lung field. There is no focal pulmonary consolidation. There is no significant pleural effusion or pneumothorax. IMPRESSION: New linear densities in the medial left lower lung fields suggest subsegmental atelectasis. There are no signs of pulmonary edema or focal pulmonary consolidation. Electronically Signed   By: Elmer Picker M.D.   On: 12/07/2021 18:19    EKG: My personal interpretation of EKG shows: no EKG  Assessment/Plan Principal Problem:   Sepsis with acute organ dysfunction without septic shock (HCC) Active Problems:   AKI (acute kidney injury) ([place])   Essential hypertension   Necrotizing fasciitis (HCC)   Thrombocytopenia (HCC)   Hypokalemia    Assessment and Plan: * Sepsis with acute organ dysfunction without septic shock ([place]) Admit to observation telemetry bed.  Unclear the source of her sepsis.  Patient without any dysuria.  She does have fever, leukocytosis.  She facility sepsis criteria.  She also has acute kidney injury.  Sepsis without septic shock with acute organ dysfunction.  Continue IV fluids.  Continue antibiotics with cefepime and vancomycin for now.  EDP  states orthopedics has come to evaluate the patient's right leg.  Repeat CBC, CMP in the morning.  AKI (acute kidney injury) [facility]) Patient with acute kidney injury.  Continue IV fluids.  Daughter states patient has not been taking p.o. fluids well.  Hold Naprosyn.  Cath UA was requested as her previous urinalysis demonstrated 20 squamous cell cells/samples indicative of a poorly collected sample.  Patient without any back pain or abdominal pain.  Unlikely pyelonephritis.  Hypokalemia repleted in the ER. Repeat CMP in AM.  Thrombocytopenia ([place]) Unclear the cause of her thrombocytopenia.  Hold heparin/Lovenox for chemical DVT prophylaxis.  Cannot place SCDs due to the patient's wounds on her lower legs.  Necrotizing fasciitis [facility]) Patient with a Staph aureus necrotizing fasciitis at the end of July.  She still has drainage from her wounds.  MRI of her lower extremity has been ordered.  Orthopedics is coming to evaluate the patient's leg.  Essential hypertension Stable.   DVT prophylaxis:  no heparin/lovenox due to thrombocytopenia. No SCDs due to bilateral lower leg wounds Code Status: Full Code Family Communication: discussed with pt's dtr and pt at bedside  Disposition Plan: return home  Consults called: EDP has consulted orthopedics  Admission status: Observation, Telemetry bed   Kristopher Oppenheim, DO [facility] 12/07/2021, 11:12 PM

## 2021-12-07 NOTE — ED Triage Notes (Signed)
[facility] and was sent here for treatmnet  they told the pt that she  was probably sepitc    headache and sl confusion for 4 days with a temp  she had tylenol up at [facility]    open wounds on both her lower legs and   she has had sugery on both f them  bandaged

## 2021-12-07 NOTE — ED Notes (Signed)
Patient is being discharged from the Urgent Care and sent to the Emergency Department via POV with family . Per Gwenette Greet, NP, patient is in need of higher level of care due to possible sepsis. Patient is aware and verbalizes understanding of plan of care.  Vitals:   12/07/21 1544  BP: 139/84  Pulse: (!) 107  Resp: 17  Temp: (!) 101.1 F (38.4 C)  SpO2: 98%

## 2021-12-07 NOTE — ED Provider Notes (Addendum)
[facility] EMERGENCY DEPARTMENT Provider Note   CSN: [id number] Arrival date & time: 12/07/21  1633     History  Chief Complaint  Patient presents with   headache/fever    Jessica Lynn is a 61 y.o. female.  HPI   61 year old female with medical history significant for HTN, HLD, obesity, GERD, postherpetic neuralgia, shingles, multiple wounds of the lower extremities status post irrigation and debridement of the right lower extremity with Dr. Sharol Given of orthopedics for sepsis, cellulitis and abscess of the right lower extremity in the setting of chronic wounds, presenting to the emergency department from urgent care with concern for sepsis.  The patient has had some intermittent confusion, increased urinary frequency for the past 4 days.  She has had urgency and frequency worse at night.  She denies any back pain, endorses fevers and chills.  She was seen in urgent care earlier today and was febrile and tachycardic with concern for sepsis.  She denies any neck pain or rigidity.  She arrived to the emergency department GCS 15, ABC intact.  She recently saw Dr. Sharol Given outpatient and had her chronic wounds to the bilateral lower extremities dressed.  Denies any new wounds or new purulent drainage.  She was last seen in clinic on 12/05/2021.  Home Medications Prior to Admission medications   Medication Sig Start Date End Date Taking? Authorizing Provider  Cholecalciferol (VITAMIN D3) 2000 units capsule Take 1 capsule (2,000 Units total) by mouth daily. 12/15/16   Plotnikov, Evie Lacks, MD  dorzolamide-timolol (COSOPT) 22.3-6.8 MG/ML ophthalmic solution Place 1 drop into the right eye 2 (two) times daily. 11/20/20   [provider]  folic acid (FOLVITE) 1 MG tablet TAKE 1 TABLET(1 MG) BY MOUTH DAILY Patient taking differently: Take 1 mg by mouth daily. 05/20/21   Plotnikov, Evie Lacks, MD  HYDROcodone-acetaminophen (NORCO/VICODIN) 5-325 MG tablet Take 1 tablet by mouth 3  (three) times daily as needed for severe pain. 11/14/21 11/14/22  Plotnikov, Evie Lacks, MD  hydrOXYzine (ATARAX/VISTARIL) 25 MG tablet TAKE 1 TABLET BY MOUTH EVERY 8 HOURS AS NEEDED FOR ITCHING Patient taking differently: Take 25 mg by mouth daily as needed for itching. 11/30/20   Plotnikov, Evie Lacks, MD  methocarbamol (ROBAXIN) 500 MG tablet Take 1 tablet (500 mg total) by mouth every 6 (six) hours as needed for muscle spasms. 11/05/21   Donne Hazel, MD  montelukast (SINGULAIR) 10 MG tablet TAKE 1 TABLET(10 MG) BY MOUTH DAILY Patient taking differently: Take 10 mg by mouth daily in the afternoon. 12/24/20   Plotnikov, Evie Lacks, MD  naproxen (NAPROSYN) 500 MG tablet TAKE 1 TABLET BY MOUTH TWICE DAILY WITH FOOD FOR MODERATE PAIN 12/02/21   Plotnikov, Evie Lacks, MD  potassium chloride SA (KLOR-CON M) 20 MEQ tablet Take 1 tablet (20 mEq total) by mouth daily. 11/16/21   Plotnikov, Evie Lacks, MD  prednisoLONE acetate (PRED FORTE) 1 % ophthalmic suspension Place 1 drop into both eyes 4 (four) times daily. 01/20/21   [provider]  predniSONE (DELTASONE) 10 MG tablet Take 2 tablets (20 mg total) by mouth daily with breakfast. 11/21/21   [name], MD  SANTYL 250 UNIT/GM ointment Apply 1 Application topically daily. 10/16/21   [provider]  Zinc Sulfate 220 (50 Zn) MG TABS Take 1 tablet (220 mg total) by mouth daily. 11/06/21   Donne Hazel, MD  zolpidem (AMBIEN) 10 MG tablet Take 1 tablet (10 mg total) by mouth at bedtime as  needed for sleep. 11/14/21   Plotnikov, Evie Lacks, MD      Allergies    Crestor [rosuvastatin] and Oxycodone    Review of Systems   Review of Systems  All other systems reviewed and are negative.   Physical Exam Updated Vital Signs BP (!) 143/82   Pulse 87   Temp (!) 101.3 F (38.5 C) (Oral)   Resp (!) 25   Ht _0  (1.651 m)   Wt 91.6 kg   LMP 06/06/2015 Comment: irregular   SpO2 99%   BMI 33.60 kg/m  Physical Exam Vitals and nursing note  reviewed.  Constitutional:      General: She is not in acute distress.    Appearance: She is well-developed.     Comments: GCS 15, ABC intact  HENT:     Head: Normocephalic and atraumatic.  Eyes:     Conjunctiva/sclera: Conjunctivae normal.  Neck:     Comments: No neck rigidity, ROM normal without pain Cardiovascular:     Rate and Rhythm: Normal rate and regular rhythm.  Pulmonary:     Effort: Pulmonary effort is normal. No respiratory distress.     Breath sounds: Normal breath sounds.  Abdominal:     Palpations: Abdomen is soft.     Tenderness: There is no abdominal tenderness.     Comments: No abdominal tenderness to palpation, no rebound or guarding  Musculoskeletal:        General: Swelling present. No tenderness.     Cervical back: Neck supple.     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     Capillary Refill: Capillary refill takes less than 2 seconds.  Neurological:     Mental Status: She is alert.  Psychiatric:        Mood and Affect: Mood normal.    Left Leg:   Right Leg:   ED Results / Procedures / Treatments   Labs (all labs ordered are listed, but only abnormal results are displayed) Labs Reviewed  COMPREHENSIVE METABOLIC PANEL - Abnormal; Notable for the following components:      Result Value   Potassium 2.9 (*)    BUN 22 (*)    Creatinine, Ser 1.80 (*)    Calcium 8.4 (*)    Total Protein 5.2 (*)    Albumin 2.3 (*)    Alkaline Phosphatase 159 (*)    Total Bilirubin 1.6 (*)    GFR, Estimated 32 (*)    All other components within normal limits  CBC WITH DIFFERENTIAL/PLATELET - Abnormal; Notable for the following components:   WBC 27.9 (*)    RBC 3.01 (*)    Hemoglobin 9.7 (*)    HCT 28.4 (*)    Platelets 70 (*)    Neutro Abs 22.0 (*)    Monocytes Absolute 3.1 (*)     Abs Immature Granulocytes 0.18 (*)    All other components within normal limits  PROTIME-INR - Abnormal; Notable for the following components:   Prothrombin Time 17.1 (*)    INR 1.4 (*)    All other components within normal limits  CULTURE, BLOOD (ROUTINE X 2)  CULTURE, BLOOD (ROUTINE X 2)  RESP PANEL BY RT-PCR (FLU A&B, COVID) ARPGX2  LACTIC ACID, PLASMA  URINALYSIS, ROUTINE W REFLEX MICROSCOPIC  PATHOLOGIST SMEAR REVIEW  SEDIMENTATION RATE  C-REACTIVE PROTEIN  MAGNESIUM  EKG None  Radiology DG Tibia/Fibula Right  Result Date: 12/07/2021 CLINICAL DATA:  Chronic right lower extremity wound EXAM: RIGHT TIBIA AND FIBULA - 2 VIEW COMPARISON:  None Available. FINDINGS: Normal alignment. No acute fracture or dislocation. No osseous erosions or abnormal periosteal reaction. There is diffuse subcutaneous edema involving the right lower extremity. Shallow ulcer noted along the medial aspect of the right lower extremity at the level of the mid diaphysis of the tibia and fibula. IMPRESSION: Soft tissue ulcer and diffuse subcutaneous edema. No radiographic evidence of osteomyelitis. Electronically Signed   By: Fidela Salisbury M.D.   On: 12/07/2021 20:05   DG Chest 2 View  Result Date: 12/07/2021 CLINICAL DATA:  Possible sepsis EXAM: CHEST - 2 VIEW COMPARISON:  10/28/2021 FINDINGS: Transverse diameter of heart is increased. This may be partly due to poor inspiration. There are no signs of alveolar pulmonary edema. Linear densities are seen in medial left lower lung field. There is no focal pulmonary consolidation. There is no significant pleural effusion or pneumothorax. IMPRESSION: New linear densities in the medial left lower lung fields suggest subsegmental atelectasis. There are no signs of pulmonary edema or focal pulmonary consolidation. Electronically Signed   By: Elmer Picker M.D.   On: 12/07/2021 18:19    Procedures .Critical Care  Performed by: Regan Lemming, MD Authorized  by: Regan Lemming, MD   Critical care provider statement:    Critical care time (minutes):  80   Critical care was necessary to treat or prevent imminent or life-threatening deterioration of the following conditions:  Sepsis   Critical care was time spent personally by me on the following activities:  Development of treatment plan with patient or surrogate, discussions with consultants, evaluation of patient's response to treatment, examination of patient, ordering and review of laboratory studies, ordering and review of radiographic studies, ordering and performing treatments and interventions, pulse oximetry, re-evaluation of patient's condition and review of old charts     Medications Ordered in ED Medications  lactated ringers infusion (has no administration in time range)  lactated ringers bolus 1,000 mL (1,000 mLs Intravenous New Bag/Given 12/07/21 2054)  vancomycin (VANCOCIN) IVPB 1000 mg/200 mL premix (has no administration in time range)  potassium chloride SA (KLOR-CON M) CR tablet 40 mEq (0 mEq Oral Hold 12/07/21 2056)  potassium chloride 10 mEq in 100 mL IVPB (has no administration in time range)  LORazepam (ATIVAN) tablet 0.5 mg (has no administration in time range)  LORazepam (ATIVAN) injection 0.5 mg (has no administration in time range)  LORazepam (ATIVAN) injection 1 mg (has no administration in time range)  linezolid (ZYVOX) IVPB 600 mg (has no administration in time range)  ceFEPIme (MAXIPIME) 2 g in sodium chloride 0.9 % 100 mL IVPB (2 g Intravenous New Bag/Given 12/07/21 2055)    ED Course/ Medical Decision Making/ A&P Clinical Course as of 12/07/21 2230  Sat Dec 07, 2021  1923 WBC(!): 27.9 [JL]  1923 Creatinine(!): 1.80 [JL]  2054 Temp(!): 101.3 F (38.5 C) [JL]  2055 Pulse Rate(!): 107 [JL]  2230 Nitrite(!): POSITIVE [JL]  2230 Leukocytes,Ua(!): LARGE [JL]  2230 WBC, UA(!): >50 [JL]  2230 Bacteria, UA(!): MANY [JL]    Clinical Course User Index [JL] Regan Lemming, MD                           Medical Decision Making Amount and/or Complexity of Data Reviewed Labs: ordered. Decision-making details documented in ED Course. Radiology:  ordered.  Risk Prescription drug management.    61 year old female with medical history significant for HTN, HLD, obesity, GERD, postherpetic neuralgia, shingles, multiple wounds of the lower extremities status post irrigation and debridement of the right lower extremity with Dr. Sharol Given of orthopedics for sepsis, cellulitis and abscess of the right lower extremity in the setting of chronic wounds, presenting to the emergency department from urgent care with concern for sepsis.  The patient has had some intermittent confusion, increased urinary frequency for the past 4 days.  She has had urgency and frequency worse at night.  She denies any back pain, endorses fevers and chills.  She was seen in urgent care earlier today and was febrile and tachycardic with concern for sepsis.  She denies any neck pain or rigidity.  She arrived to the emergency department GCS 15, ABC intact.  She recently saw Dr. Sharol Given outpatient and had her chronic wounds to the bilateral lower extremities dressed.  Denies any new wounds or new purulent drainage.  She was last seen in clinic on 12/05/2021.  On arrival, the patient was meeting SIRS criteria, febrile to 101.3, tachycardic P107.  Code sepsis initiated on patient arrival.  Her exam is most notable for lungs clear to auscultation bilaterally, negative meningeal signs, no obvious evidence of cellulitis, no tenderness of the spine, abdomen soft, nontender, non-distended, No rebound or guarding.  Patient has swelling in the bilateral lower extremities with chronic wounds noted.    Differential diagnosis includes cellulitis, recurrent abscess,considered necrotizing fasciitis of the lower extremities, considered osteomyelitis, considered urinary tract infection, also the patient does report on further  evaluation increased urinary urgency, frequency, mild incontinence.  Had complaint of back pain at urgent care per chart review, also considered epidural abscess although feel less likely.  The most likely infectious source is genitourinary vs soft tissue vs osteomyelitis.  I have a low suspicion for meningitis, endocarditis, pyelonephritis, proctitis. The patient has multiple open wounds which could be a source for infection although no significant erythema or warmth about these wounds. Wounds were foul smelling but minimally purulent.   CBC with leukocytosis to 27.9, anemia to 9.7, stable to prior measurements, normal cytopenia to 70 noted.  BMP with hypokalemia to 2.9, AKI with a creatinine of 1.8 from baseline of 0.8, LFTs normal, blood cultures x2 collected and pending, ESR, CRP collected and pending, urinalysis ordered, urine culture ordered and pending.   X-ray imaging of the right tib-fib revealed soft tissue edema with ulcerative lesions present without soft tissue gas, chest x-ray revealed no acute cardiac or pulmonary abnormality. Due to her history of sepsis from soft tissue cellulitis and abscess, orthopedics was consulted for further recommendations.   For treatment, the patient was given a liter LR for IVF and Vancomycin, cefepime, Linezolid for antimicrobials.  Upon reassessment, the patient appears clinically improved. The patient is not currently hypotensive and has a MAP of >65. The patient's volume status appears to be improved after fluid resuscitation.  Pt's LRINEC score is 6, with CRP pending. Concerning escalating for necrotizing soft tissue infection given the patient's history. She is being covered with Linezolid. She has no crepitus or soft tissue gas. Plan for orthopedics consultation for evaluation tonight. Sepsis from osteomyelitis is also a consideration and MRI is pending.  Based off the presentation and lab findings, the patient meets criteria for admission for severe  sepsis, remainder of imaging work-up pending.  Plan at time of signout to follow-up MRI imaging of the lumbar spine with and  without contrast to evaluate for epidural abscess in the setting of of the patient's incontinence, follow-up urinary symptoms, follow-up MRI of the tib-fib to evaluate for osteomyelitis.  Plan for likely admission.  Signout given to Dr. Alvino Chapel at 2100, with PA Ranson aware of the patient and following workup and specialty consultation.  [SEVERE SEPSIS] HYPOTENSION (Nursing BPA for hypotension = Is this sepsis?) CREATININE > 2.0, or urine output < 0.5 mL/kg/hour for 2 hours  BILIRUBIN > 2 mg/dL (34.2 mmol/L)  PLATELET COUNT < 100,000  INR > 1.5 or aPTT > 60 sec  LACTATE > 2 mmol/L   [SEPTIC SHOCK] HYPOTENSION REFRACTORY TO 30 cc/kg OF FLUIDS LACTATE > 4 mmol/L  While in the ED, the patient was given: Medications  lactated ringers infusion (has no administration in time range)  lactated ringers bolus 1,000 mL (1,000 mLs Intravenous New Bag/Given 12/07/21 2054)  vancomycin (VANCOCIN) IVPB 1000 mg/200 mL premix (has no administration in time range)  potassium chloride SA (KLOR-CON M) CR tablet 40 mEq (0 mEq Oral Hold 12/07/21 2056)  potassium chloride 10 mEq in 100 mL IVPB (has no administration in time range)  LORazepam (ATIVAN) tablet 0.5 mg (has no administration in time range)  LORazepam (ATIVAN) injection 0.5 mg (has no administration in time range)  LORazepam (ATIVAN) injection 1 mg (has no administration in time range)  linezolid (ZYVOX) IVPB 600 mg (has no administration in time range)  ceFEPIme (MAXIPIME) 2 g in sodium chloride 0.9 % 100 mL IVPB (2 g Intravenous New Bag/Given 12/07/21 2055)   Final Clinical Impression(s) / ED Diagnoses Final diagnoses:  Sepsis with acute renal failure without septic shock, due to unspecified organism, unspecified acute renal failure type ([place])  AKI (acute kidney injury) ([place])  Multiple open wounds of right lower extremity,  initial encounter  Hypokalemia    Rx / DC Orders ED Discharge Orders     None            Regan Lemming, MD 12/07/21 2236
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
## 2021-12-07 NOTE — Assessment & Plan Note (Signed)
repleted in the ER. Repeat CMP in AM.

## 2021-12-07 NOTE — Assessment & Plan Note (Addendum)
Patient with acute kidney injury.  Continue IV fluids.  Daughter states patient has not been taking p.o. fluids well.  Hold Naprosyn.  Cath UA was requested as her previous urinalysis demonstrated 20 squamous cell cells/samples indicative of a poorly collected sample.  Patient without any back pain or abdominal pain.  Unlikely pyelonephritis.

## 2021-12-07 NOTE — Assessment & Plan Note (Signed)
Patient with a Staph aureus necrotizing fasciitis at the end of July.  She still has drainage from her wounds.  MRI of her lower extremity has been ordered.  Orthopedics is coming to evaluate the patient's leg.

## 2021-12-07 NOTE — Assessment & Plan Note (Signed)
Admit to observation telemetry bed.  Unclear the source of her sepsis.  Patient without any dysuria.  She does have fever, leukocytosis.  She facility sepsis criteria.  She also has acute kidney injury.  Sepsis without septic shock with acute organ dysfunction.  Continue IV fluids.  Continue antibiotics with cefepime and vancomycin for now.  EDP states orthopedics has come to evaluate the patient's right leg.  Repeat CBC, CMP in the morning.

## 2021-12-07 NOTE — Subjective & Objective (Signed)
Chief complaint: Lethargy History present illness: 61 year old African-American female history of hypertension, diagnosis of necrotizing fasciitis secondary to Staph aureus in her right leg in July 2023 presents the ER today with a 4-day history of lethargy, altered mental status.  Daughter is at the bedside.  She states the patient's been sleeping a lot for the last 4 days.  Barely getting out of bed.  Is been eating and drinking very little.  She sleeps all the time according the daughter.  Patient had a fever today.  Was brought to urgent care and sent to the ER for evaluation.  Patient denies any dysuria.  She has had some urinary incontinence but the daughter thinks that this is due to the patient moving slow to the bathroom.  Patient denies any nausea, vomiting.  No shortness of breath or chest pain.  No abdominal pain.  No flank pain.  She denies any dysuria.  On arrival to the ER, temp 101.3 heart rate 107 blood pressure 129/83.  White count 27.9, hemoglobin 9.7, platelets 70,000  Daughter states that she has been having patient's legs wrapped since discharge.  They continue to drain.  Right tib-fib x-ray showed soft tissue ulceration and subcutaneous edema.  No evidence of osteomyelitis.  There is no fractures.  Chest x-ray was negative for pulm edema.  There is subsegmental atelectasis.  Orthopedics has been consulted.  [facility] hospitalist contacted for admission.  Patient's initial urine sample was obtained from bedside bedpan.  Straight cath UA sample and culture were requested.

## 2021-12-07 NOTE — Assessment & Plan Note (Signed)
Unclear the cause of her thrombocytopenia.  Hold heparin/Lovenox for chemical DVT prophylaxis.  Cannot place SCDs due to the patient's wounds on her lower legs.

## 2021-12-07 NOTE — ED Triage Notes (Signed)
For about 4 days having urinary frequency, little confusion, headaches, not eating per normal. Was in hospital about a month ago. Had issues with kidney not functioning properly, has wounds on bilat legs that needed surgery.

## 2021-12-07 NOTE — ED Provider Notes (Signed)
  Physical Exam  BP 120/79   Pulse 87   Temp (!) 101.3 F (38.5 C) (Oral)   Resp (!) 24   Ht '5\' 5"'$  (1.651 m)   Wt 91.6 kg   LMP 06/06/2015 Comment: irregular   SpO2 99%   BMI 33.60 kg/m   Physical Exam  Procedures  Procedures  ED Course / MDM   Clinical Course as of 12/07/21 2225  Sat Dec 07, 2021  1923 WBC(!): 27.9 [JL]  1923 Creatinine(!): 1.80 [JL]  2054 Temp(!): 101.3 F (38.5 C) [JL]  2055 Pulse Rate(!): 107 [JL]    Clinical Course User Index [JL] Regan Lemming, MD   Medical Decision Making Amount and/or Complexity of Data Reviewed Labs: ordered. Decision-making details documented in ED Course. Radiology: ordered.  Risk Prescription drug management.  Received patient signout.  Fever with infection.  Urine is a potential source but also has wounds on legs.  Has had necrotizing fasciitis prior also.  Lactic acid normal but creatinine elevatedWBC and CRP.  Ortho is coming to see patient.  Antibiotics been given. MRI to be done        Davonna Belling, MD 12/07/21 2229

## 2021-12-07 NOTE — Assessment & Plan Note (Signed)
Stable

## 2021-12-07 NOTE — ED Provider Notes (Cosign Needed Addendum)
Spoke with PA Carney Bern with Dr. Pryor Montes office. I discussed with him the significant concern for nec fasc of the lower extremities given her LRINEC score and her now resulting CRP of 14.2, new thrombocytopenia, and fever. He reports that he will come to see the patient tonight.   ADDENDUM: Spoke with Dr. Erlinda Hong who evaluated the patient at bedside. He does not see this as necrotizing fascitis now, but will have ortho follow up on her in the morning.    Sherrell Puller, PA-C 12/07/21 2211    Sherrell Puller, PA-C 12/07/21 2236    Regan Lemming, MD 12/08/21 [phone]

## 2021-12-08 ENCOUNTER — Inpatient Hospital Stay (HOSPITAL_COMMUNITY): Payer: BC Managed Care – PPO

## 2021-12-08 DIAGNOSIS — N390 Urinary tract infection, site not specified: Secondary | ICD-10-CM | POA: Diagnosis present

## 2021-12-08 DIAGNOSIS — B0229 Other postherpetic nervous system involvement: Secondary | ICD-10-CM | POA: Diagnosis present

## 2021-12-08 DIAGNOSIS — R7881 Bacteremia: Secondary | ICD-10-CM

## 2021-12-08 DIAGNOSIS — L03115 Cellulitis of right lower limb: Secondary | ICD-10-CM | POA: Diagnosis present

## 2021-12-08 DIAGNOSIS — Z6835 Body mass index (BMI) 35.0-35.9, adult: Secondary | ICD-10-CM | POA: Diagnosis not present

## 2021-12-08 DIAGNOSIS — Z885 Allergy status to narcotic agent status: Secondary | ICD-10-CM | POA: Diagnosis not present

## 2021-12-08 DIAGNOSIS — A4151 Sepsis due to Escherichia coli [E. coli]: Secondary | ICD-10-CM | POA: Diagnosis present

## 2021-12-08 DIAGNOSIS — Z7952 Long term (current) use of systemic steroids: Secondary | ICD-10-CM | POA: Diagnosis not present

## 2021-12-08 DIAGNOSIS — M726 Necrotizing fasciitis: Secondary | ICD-10-CM | POA: Diagnosis not present

## 2021-12-08 DIAGNOSIS — Z888 Allergy status to other drugs, medicaments and biological substances status: Secondary | ICD-10-CM | POA: Diagnosis not present

## 2021-12-08 DIAGNOSIS — E785 Hyperlipidemia, unspecified: Secondary | ICD-10-CM | POA: Diagnosis present

## 2021-12-08 DIAGNOSIS — Z87891 Personal history of nicotine dependence: Secondary | ICD-10-CM | POA: Diagnosis not present

## 2021-12-08 DIAGNOSIS — Z8371 Family history of colonic polyps: Secondary | ICD-10-CM | POA: Diagnosis not present

## 2021-12-08 DIAGNOSIS — D649 Anemia, unspecified: Secondary | ICD-10-CM | POA: Diagnosis present

## 2021-12-08 DIAGNOSIS — N179 Acute kidney failure, unspecified: Secondary | ICD-10-CM | POA: Diagnosis present

## 2021-12-08 DIAGNOSIS — R6520 Severe sepsis without septic shock: Secondary | ICD-10-CM | POA: Diagnosis present

## 2021-12-08 DIAGNOSIS — D696 Thrombocytopenia, unspecified: Secondary | ICD-10-CM | POA: Diagnosis present

## 2021-12-08 DIAGNOSIS — I10 Essential (primary) hypertension: Secondary | ICD-10-CM | POA: Diagnosis present

## 2021-12-08 DIAGNOSIS — A419 Sepsis, unspecified organism: Secondary | ICD-10-CM | POA: Diagnosis not present

## 2021-12-08 DIAGNOSIS — B9620 Unspecified Escherichia coli [E. coli] as the cause of diseases classified elsewhere: Secondary | ICD-10-CM

## 2021-12-08 DIAGNOSIS — E669 Obesity, unspecified: Secondary | ICD-10-CM | POA: Diagnosis present

## 2021-12-08 DIAGNOSIS — K219 Gastro-esophageal reflux disease without esophagitis: Secondary | ICD-10-CM | POA: Diagnosis present

## 2021-12-08 DIAGNOSIS — Z20822 Contact with and (suspected) exposure to covid-19: Secondary | ICD-10-CM | POA: Diagnosis present

## 2021-12-08 DIAGNOSIS — Z800 Family history of malignant neoplasm of digestive organs: Secondary | ICD-10-CM | POA: Diagnosis not present

## 2021-12-08 DIAGNOSIS — E876 Hypokalemia: Secondary | ICD-10-CM

## 2021-12-08 LAB — COMPREHENSIVE METABOLIC PANEL
ALT: 18 U/L (ref 0–44)
AST: 39 U/L (ref 15–41)
Albumin: 2 g/dL — ABNORMAL LOW (ref 3.5–5.0)
Alkaline Phosphatase: 157 U/L — ABNORMAL HIGH (ref 38–126)
Anion gap: 9 (ref 5–15)
BUN: 22 mg/dL — ABNORMAL HIGH (ref 6–20)
CO2: 22 mmol/L (ref 22–32)
Calcium: 8.1 mg/dL — ABNORMAL LOW (ref 8.9–10.3)
Chloride: 105 mmol/L (ref 98–111)
Creatinine, Ser: 1.76 mg/dL — ABNORMAL HIGH (ref 0.44–1.00)
GFR, Estimated: 33 mL/min — ABNORMAL LOW (ref >60)
Glucose, Bld: 102 mg/dL — ABNORMAL HIGH (ref 70–99)
Potassium: 2.8 mmol/L — ABNORMAL LOW (ref 3.5–5.1)
Sodium: 136 mmol/L (ref 135–145)
Total Bilirubin: 1.7 mg/dL — ABNORMAL HIGH (ref 0.3–1.2)
Total Protein: 4.5 g/dL — ABNORMAL LOW (ref 6.5–8.1)

## 2021-12-08 LAB — CBC WITH DIFFERENTIAL/PLATELET
Abs Immature Granulocytes: 0.13 10*3/uL — ABNORMAL HIGH (ref 0.00–0.07)
Basophils Absolute: 0.1 10*3/uL (ref 0.0–0.1)
Basophils Relative: 0 %
Eosinophils Absolute: 0.1 10*3/uL (ref 0.0–0.5)
Eosinophils Relative: 1 %
HCT: 26.8 % — ABNORMAL LOW (ref 36.0–46.0)
Hemoglobin: 9.3 g/dL — ABNORMAL LOW (ref 12.0–15.0)
Immature Granulocytes: 1 %
Lymphocytes Relative: 9 %
Lymphs Abs: 2.1 10*3/uL (ref 0.7–4.0)
MCH: 32.3 pg (ref 26.0–34.0)
MCHC: 34.7 g/dL (ref 30.0–36.0)
MCV: 93.1 fL (ref 80.0–100.0)
Monocytes Absolute: 2.6 10*3/uL — ABNORMAL HIGH (ref 0.1–1.0)
Monocytes Relative: 11 %
Neutro Abs: 18.8 10*3/uL — ABNORMAL HIGH (ref 1.7–7.7)
Neutrophils Relative %: 78 %
Platelets: 63 10*3/uL — ABNORMAL LOW (ref 150–400)
RBC: 2.88 MIL/uL — ABNORMAL LOW (ref 3.87–5.11)
RDW: 15.3 % (ref 11.5–15.5)
WBC: 23.8 10*3/uL — ABNORMAL HIGH (ref 4.0–10.5)
nRBC: 0 % (ref 0.0–0.2)

## 2021-12-08 LAB — BLOOD CULTURE ID PANEL (REFLEXED) - BCID2

## 2021-12-08 LAB — URINALYSIS, ROUTINE W REFLEX MICROSCOPIC
Bilirubin Urine: NEGATIVE
Glucose, UA: NEGATIVE mg/dL
Ketones, ur: NEGATIVE mg/dL
Nitrite: NEGATIVE
Protein, ur: 30 mg/dL — AB
Specific Gravity, Urine: 1.005 (ref 1.005–1.030)
WBC, UA: >50 WBC/hpf — ABNORMAL HIGH (ref 0–5)
pH: 6 (ref 5.0–8.0)

## 2021-12-08 LAB — PROTIME-INR
INR: 1.4 — ABNORMAL HIGH (ref 0.8–1.2)
Prothrombin Time: 16.6 seconds — ABNORMAL HIGH (ref 11.4–15.2)

## 2021-12-08 LAB — MAGNESIUM
Magnesium: 2.1 mg/dL (ref 1.7–2.4)
Magnesium: 2.1 mg/dL (ref 1.7–2.4)

## 2021-12-08 LAB — RESP PANEL BY RT-PCR (FLU A&B, COVID) ARPGX2
Influenza A by PCR: NEGATIVE
Influenza B by PCR: NEGATIVE
SARS Coronavirus 2 by RT PCR: NEGATIVE

## 2021-12-08 LAB — PHOSPHORUS: Phosphorus: 3.3 mg/dL (ref 2.5–4.6)

## 2021-12-08 LAB — APTT: aPTT: 31 seconds (ref 24–36)

## 2021-12-08 LAB — FIBRINOGEN: Fibrinogen: 384 mg/dL (ref 210–475)

## 2021-12-08 LAB — D-DIMER, QUANTITATIVE: D-Dimer, Quant: 2.42 ug/mL-FEU — ABNORMAL HIGH (ref 0.00–0.50)

## 2021-12-08 LAB — LACTATE DEHYDROGENASE: LDH: 295 U/L — ABNORMAL HIGH (ref 98–192)

## 2021-12-08 MED ORDER — POTASSIUM CHLORIDE 10 MEQ/100ML IV SOLN
10.0000 meq | INTRAVENOUS | Status: DC
  Administered 2021-12-08: 10 meq via INTRAVENOUS
  Filled 2021-12-08: qty 100

## 2021-12-08 MED ORDER — ACETAMINOPHEN 325 MG PO TABS
650.0000 mg | ORAL_TABLET | Freq: Four times a day (QID) | ORAL | Status: DC | PRN
  Administered 2021-12-08 (×2): 650 mg via ORAL
  Filled 2021-12-08 (×2): qty 2

## 2021-12-08 MED ORDER — LACTATED RINGERS IV SOLN: INTRAVENOUS | Status: AC

## 2021-12-08 MED ORDER — ONDANSETRON HCL 4 MG PO TABS: 4.0000 mg | ORAL_TABLET | Freq: Four times a day (QID) | ORAL | Status: DC | PRN

## 2021-12-08 MED ORDER — POTASSIUM CHLORIDE 10 MEQ/100ML IV SOLN
10.0000 meq | INTRAVENOUS | Status: AC
  Administered 2021-12-08 (×2): 10 meq via INTRAVENOUS
  Filled 2021-12-08 (×2): qty 100

## 2021-12-08 MED ORDER — POTASSIUM CHLORIDE 10 MEQ/100ML IV SOLN: 10.0000 meq | INTRAVENOUS | Status: DC

## 2021-12-08 MED ORDER — SODIUM CHLORIDE 0.9 % IV SOLN
2.0000 g | Freq: Two times a day (BID) | INTRAVENOUS | Status: DC
  Administered 2021-12-08 – 2021-12-10 (×5): 2 g via INTRAVENOUS
  Filled 2021-12-08 (×4): qty 12.5

## 2021-12-08 MED ORDER — VANCOMYCIN VARIABLE DOSE PER UNSTABLE RENAL FUNCTION (PHARMACIST DOSING): Status: DC

## 2021-12-08 MED ORDER — ACETAMINOPHEN 650 MG RE SUPP: 650.0000 mg | Freq: Four times a day (QID) | RECTAL | Status: DC | PRN

## 2021-12-08 MED ORDER — ONDANSETRON HCL 4 MG/2ML IJ SOLN: 4.0000 mg | Freq: Four times a day (QID) | INTRAMUSCULAR | Status: DC | PRN

## 2021-12-08 NOTE — Significant Event (Signed)
Pt Dressings Was Removed from Right Lower extremities to confirm no metal coils.  Repeat x-ray reordered to confirm before MRI.  Also waiting on Iv team to  be replaced to administer IV antibiotics. Dr Sherral Hammers is aware.

## 2021-12-08 NOTE — Progress Notes (Signed)
Jessica Lynn:[id number] DOB: 1960-08-17 DOA: 12/07/2021 PCP: Cassandria Anger, MD   Subj: 61 yo BF PMHx HTN,Dx of necrotizing fasciitis secondary to Staph aureus in her right leg in July 2023   Presents the ER today with a 4-day history of lethargy, altered mental status.  Daughter is at the bedside.  She states the patient's been sleeping a lot for the last 4 days.  Barely getting out of bed.  Is been eating and drinking very little.  She sleeps all the time according the daughter.  Patient had a fever today.  Was brought to urgent care and sent to the ER for evaluation.   Patient denies any dysuria.  She has had some urinary incontinence but the daughter thinks that this is due to the patient moving slow to the bathroom. Patient denies any nausea, vomiting.  No shortness of breath or chest pain.  No abdominal pain.  No flank pain.  She denies any dysuria.   On arrival to the ER, temp 101.3 heart rate 107 blood pressure 129/83.   White count 27.9, hemoglobin 9.7, platelets 70,000   Daughter states that she has been having patient's legs wrapped since discharge.  They continue to drain.   Right tib-fib x-ray showed soft tissue ulceration and subcutaneous edema.  No evidence of osteomyelitis.  There is no fractures.   Chest x-ray was negative for pulm edema.  There is subsegmental atelectasis.   Orthopedics has been consulted.   Patient's initial urine sample was obtained from bedside bedpan.      Obj: 9/3 Tmax 38.5 C.  A/O x4, negative leg pain   Objective: VITAL SIGNS: Temp: 98.3 F (36.8 C) (09/03 0729) Temp Source: Oral (09/03 0415) BP: 124/71 (09/03 0729) Pulse Rate: 91 (09/03 0729) SPO2; FIO2:   Intake/Output Summary (Last 24 hours) at 12/08/2021 0835 Last data filed at 12/08/2021 0300 Gross per 24 hour  Intake 673.54 ml  Output 0 ml  Net 673.54 ml     Exam: General: A/O x4, No acute respiratory distress Lungs: Clear to auscultation bilaterally without  wheezes or crackles Cardiovascular: Regular rate and rhythm without murmur gallop or rub normal S1 and S2 Abdomen: Nontender, nondistended, soft, bowel sounds positive, no rebound, no ascites, no appreciable mass Extremities:          Skin:  Psychiatric:  Negative depression, negative anxiety, negative fatigue, negative mania  Central nervous system:  Cranial nerves II through XII intact, tongue/uvula midline, all extremities muscle strength 5/5, sensation intact throughout, negative dysarthria, negative expressive aphasia, negative receptive aphasia.  .  Mobility Assessment (last 72 hours)     Mobility Assessment     Row Name 12/08/21 0120           Does patient have an order for bedrest or is patient medically unstable No - Continue assessment       What is the highest level of mobility based on the progressive mobility assessment? Level 2 ([place]) - Balance while sitting on edge of bed and cannot stand       Is the above level different from baseline mobility prior to current illness? Yes - Recommend PT order                  DVT prophylaxis: severe thrombocytopenia. Code Status: Full Family Communication: 9/3 daughter at bedside for discussion of plan of care all questions answered Status is: Inpatient    Dispo: The patient is from: Home  Anticipated d/c is to: Home              Anticipated d/c date is: > 3 days              Patient currently is not medically stable to d/c.    Procedures/Significant Events:    Consultants:  Orthopedic surgery Dr. Sharol Given   Cultures 9/2 Blood positive E Coli 9/2 Blood positive E Coli     Antimicrobials: Anti-infectives (From admission, onward)    Start     Ordered Stop   12/08/21 0800  ceFEPIme (MAXIPIME) 2 g in sodium chloride 0.9 % 100 mL IVPB        12/08/21 0008     12/08/21 0008  vancomycin variable dose per unstable renal function (pharmacist dosing)        12/08/21 0008     12/08/21 0000   vancomycin (VANCOCIN) IVPB 1000 mg/200 mL premix        12/07/21 2316 12/08/21 0136   12/07/21 2200  linezolid (ZYVOX) IVPB 600 mg  Status:  Discontinued        12/07/21 2130 12/08/21 0145   12/07/21 1930  vancomycin (VANCOCIN) IVPB 1000 mg/200 mL premix        12/07/21 1923 12/07/21 2320   12/07/21 1930  ceFEPIme (MAXIPIME) 2 g in sodium chloride 0.9 % 100 mL IVPB        12/07/21 1923 12/07/21 2147   12/07/21 1915  cefTRIAXone (ROCEPHIN) 2 g in sodium chloride 0.9 % 100 mL IVPB  Status:  Discontinued        12/07/21 1912 12/07/21 1922        A/P  Sepsis with acute organ dysfunction without septic shock ([place]) -On admission meets sepsis criteria. - Antibiotics and fluids started in ED. - Orthopedics has come to evaluate the patient's right leg.  [name]  Positive E Coli Bacteremia -Patient will require minimum 7 days antibiotics.  AKI (acute kidney injury) [facility]) -Daughter states patient has not been taking p.o. fluids well.   -Hold all nephrotoxic medication  Lab Results  Component Value Date   CREATININE 1.76 (H) 12/08/2021   CREATININE 1.80 (H) 12/07/2021   CREATININE 0.81 11/14/2021   CREATININE 0.66 11/05/2021   CREATININE 0.78 11/04/2021  -Strict in and out - Daily weight - LR 172m/hr    Thrombocytopenia ([facility]) -Hold heparin secondary to severe thrombocytopenia. -Most likely DIC -INR, PTT, D-dimer, fibrinogen,LDH, haptoglobin, cytology (peripheral blood smear pending) pending   Necrotizing fasciitis ([place] -Patient with a Staph aureus necrotizing fasciitis at the end of July.   -Still has drainage from her wounds.  Dressing changes per orthopedic recommendations. - MRI of her lower extremity pending    Essential hypertension -Stable.  Hypokalemia - Potassium goal>4 -9/3 Potassium IV 50 mEq  Obesity (BMI 33.6 kg/m.) -    Care during the described time interval was provided by me .  I have reviewed this patient's available data, including medical history,  events of note, physical examination, and all test results as part of my evaluation.

## 2021-12-08 NOTE — Significant Event (Signed)
During Report patient has a swollen Right Arm IV LR and Potassium Infusing turned Infusion Off. Attempted to insert a new IV I Left arm. Patient jumped and stated that it hurts too bad. unable to advance IV 1 attempt. IV team Consulted.

## 2021-12-08 NOTE — Progress Notes (Signed)
Pharmacy Antibiotic Note  Jessica Lynn is a 61 y.o. female admitted on 12/07/2021 with sepsis and wound infection.  Pharmacy has been consulted for vancomycin and cefepime dosing.  Pt w/ AKI; baseline SCr <1, now 1.8.  Plan: Vancomycin '2000mg'$  IV x1; monitor SCr +/- vanc level prior to redosing. Cefepime 2g IV Q12H.  Height: '5\' 5"'$  (165.1 cm) Weight: 91.6 kg (201 lb 15.1 oz) IBW/kg (Calculated) : 57  Temp (24hrs), Avg:101.2 F (38.4 C), Min:101.1 F (38.4 C), Max:101.3 F (38.5 C)  Recent Labs  Lab 12/07/21 1743  WBC 27.9*  CREATININE 1.80*  LATICACIDVEN 1.8    Estimated Creatinine Clearance: 37.6 mL/min (A) (by C-G formula based on SCr of 1.8 mg/dL (H)).    Allergies  Allergen Reactions   Crestor [Rosuvastatin] Other (See Comments)    cramps   Oxycodone Itching    Thank you for allowing pharmacy to be a part of this patient's care.  Wynona Neat, PharmD, BCPS  12/08/2021 12:08 AM

## 2021-12-08 NOTE — Plan of Care (Signed)

## 2021-12-08 NOTE — Progress Notes (Signed)
As per ED RN, she did In and out cath to collect Urine and got 138m output.

## 2021-12-08 NOTE — Progress Notes (Signed)
PHARMACY - PHYSICIAN COMMUNICATION CRITICAL VALUE ALERT - BLOOD CULTURE IDENTIFICATION (BCID)  Jessica Lynn is an 61 y.o. female who presented to [facility] on 12/07/2021 with a chief complaint of lethargy. Patient has a PMH significant for necrotizing fasciitis secondary to staph aureus in July 2023.   Assessment:  2/4 BCX E.Coli with no resistance detected, potential sources include UTI or potentially leg wounds   Name of physician (or Provider) Contacted: Orlean Patten, MD   Current antibiotics: Vancomycin/Cefepime   Changes to prescribed antibiotics recommended:  Organism covered by current antibiotics. Given patient's history of recent nec fasciitis and potential for polymicrobial cellulitis/wound infection spoke with provider. Provider would like to continue vancomycin and cefepime for the current time.    Results for orders placed or performed during the hospital encounter of 12/07/21  Blood Culture ID Panel (Reflexed) (Collected: 12/07/2021  5:43 PM)  Result Value Ref Range   Enterococcus faecalis NOT DETECTED NOT DETECTED   Enterococcus Faecium NOT DETECTED NOT DETECTED   Listeria monocytogenes NOT DETECTED NOT DETECTED   Staphylococcus species NOT DETECTED NOT DETECTED   Staphylococcus aureus (BCID) NOT DETECTED NOT DETECTED   Staphylococcus epidermidis NOT DETECTED NOT DETECTED   Staphylococcus lugdunensis NOT DETECTED NOT DETECTED   Streptococcus species NOT DETECTED NOT DETECTED   Streptococcus agalactiae NOT DETECTED NOT DETECTED   Streptococcus pneumoniae NOT DETECTED NOT DETECTED   Streptococcus pyogenes NOT DETECTED NOT DETECTED   A.calcoaceticus-baumannii NOT DETECTED NOT DETECTED   Bacteroides fragilis NOT DETECTED NOT DETECTED   Enterobacterales DETECTED (A) NOT DETECTED   Enterobacter cloacae complex NOT DETECTED NOT DETECTED   Escherichia coli DETECTED (A) NOT DETECTED   Klebsiella aerogenes NOT DETECTED NOT DETECTED   Klebsiella oxytoca NOT DETECTED NOT DETECTED    Klebsiella pneumoniae NOT DETECTED NOT DETECTED   Proteus species NOT DETECTED NOT DETECTED   Salmonella species NOT DETECTED NOT DETECTED   Serratia marcescens NOT DETECTED NOT DETECTED   Haemophilus influenzae NOT DETECTED NOT DETECTED   Neisseria meningitidis NOT DETECTED NOT DETECTED   Pseudomonas aeruginosa NOT DETECTED NOT DETECTED   Stenotrophomonas maltophilia NOT DETECTED NOT DETECTED   Candida albicans NOT DETECTED NOT DETECTED   Candida auris NOT DETECTED NOT DETECTED   Candida glabrata NOT DETECTED NOT DETECTED   Candida krusei NOT DETECTED NOT DETECTED   Candida parapsilosis NOT DETECTED NOT DETECTED   Candida tropicalis NOT DETECTED NOT DETECTED   Cryptococcus neoformans/gattii NOT DETECTED NOT DETECTED   CTX-M ESBL NOT DETECTED NOT DETECTED   Carbapenem resistance IMP NOT DETECTED NOT DETECTED   Carbapenem resistance KPC NOT DETECTED NOT DETECTED   Carbapenem resistance NDM NOT DETECTED NOT DETECTED   Carbapenem resist OXA 48 LIKE NOT DETECTED NOT DETECTED   Carbapenem resistance VIM NOT DETECTED NOT DETECTED    Ventura Sellers 12/08/2021  11:04 AM

## 2021-12-08 NOTE — Consult Note (Signed)
WOC Nurse Consult Note: Reason for Consult:Wound care to bilateral LE full thickness wounds.  WOC Nursing is consulted simultaneously with Orthopedics and Dr. Dollene Primrose saw yesterday, 11/06/21.  His PA-C, Karl Ito saw this afternoon and has recommended we continue twice daily wound care using NS moistened gauze dressings. See notes from those encounters.Photodocumentation of wounds provided by Provider. Wound type:Infectious Pressure Injury POA: NA Measurement:To be obtained by Bedside RN with next dressing change and entered onto Nursing Flow Sheet. Wound bed:Red, moist Drainage (amount, consistency, odor) serous to serosanguinous, small amount Periwound: no fluctuance, induration or crepitus per Dr. Dollene Primrose. Dressing procedure/placement/frequency: I have transcribed the recommendation in the Orthopedic specialist's notes for Nursing guidance in the provision of twice daily wound care. Additionally, I have added pressure injury preventive strategies (sacral prophylactic foam and floatation of heels) to mitigate risk factors of decreased mobility.  If concerns, please contact Orthopedics.  [facility] nursing team will not follow, but will remain available to this patient, the nursing and medical teams.  Please re-consult if needed.  Thank you for inviting [place] to participate in this patient's Plan of Care.  Maudie Flakes, MSN, RN, CNS, [place], Serita Grammes, [employer], [employer] phone:  [phone]

## 2021-12-08 NOTE — ED Notes (Signed)
Unable to complete MRI scans due to patient rolling onto side, MRI tech said that she will try again in the am

## 2021-12-08 NOTE — Progress Notes (Signed)
Subjective: Patient awake and in no acute distress. Follows commands.   Objective: Vital signs in last 24 hours: Temp:  [98 F (36.7 C)-101.3 F (38.5 C)] 98 F (36.7 C) (09/03 1334) Pulse Rate:  [87-107] 91 (09/03 0729) Resp:  [17-27] 19 (09/03 1334) BP: (113-143)/(71-95) 137/86 (09/03 1334) SpO2:  [96 %-100 %] 100 % (09/03 1334) Weight:  [91.6 kg] 91.6 kg (09/03 0236)  Intake/Output from previous day: 09/02 0701 - 09/03 0700 In: 673.5 [P.O.:100; I.V.:46.8; IV Piggyback:526.8] Out: 0  Intake/Output this shift: No intake/output data recorded.  Recent Labs    12/07/21 1743 12/08/21 0239  HGB 9.7* 9.3*   Recent Labs    12/07/21 1743 12/08/21 0239  WBC 27.9* 23.8*  RBC 3.01* 2.88*  HCT 28.4* 26.8*  PLT 70* 63*   Recent Labs    12/07/21 1743 12/08/21 0239  NA 138 136  K 2.9* 2.8*  CL 108 105  CO2 23 22  BUN 22* 22*  CREATININE 1.80* 1.76*  GLUCOSE 99 102*  CALCIUM 8.4* 8.1*   Recent Labs    12/07/21 1743  INR 1.4*   Bilateral lower extremities: Compartments soft. Wounds unchanged . No malodor. No gross purulence.  Dorsi/ plantar flexion bilateral ankles and toes intact.     Assessment/Plan: Continue current care . Dr. Sharol Given will see patient tomorrow.      Randee Upchurch 12/08/2021, 1:47 PM

## 2021-12-09 ENCOUNTER — Inpatient Hospital Stay (HOSPITAL_COMMUNITY): Payer: BC Managed Care – PPO

## 2021-12-09 DIAGNOSIS — R6520 Severe sepsis without septic shock: Secondary | ICD-10-CM

## 2021-12-09 DIAGNOSIS — N179 Acute kidney failure, unspecified: Secondary | ICD-10-CM

## 2021-12-09 DIAGNOSIS — S81801A Unspecified open wound, right lower leg, initial encounter: Secondary | ICD-10-CM

## 2021-12-09 DIAGNOSIS — A419 Sepsis, unspecified organism: Secondary | ICD-10-CM

## 2021-12-09 DIAGNOSIS — I10 Essential (primary) hypertension: Secondary | ICD-10-CM | POA: Diagnosis not present

## 2021-12-09 DIAGNOSIS — E876 Hypokalemia: Secondary | ICD-10-CM | POA: Diagnosis not present

## 2021-12-09 LAB — COMPREHENSIVE METABOLIC PANEL
ALT: 23 U/L (ref 0–44)
AST: 46 U/L — ABNORMAL HIGH (ref 15–41)
Albumin: 1.6 g/dL — ABNORMAL LOW (ref 3.5–5.0)
Alkaline Phosphatase: 137 U/L — ABNORMAL HIGH (ref 38–126)
Anion gap: 7 (ref 5–15)
BUN: 21 mg/dL — ABNORMAL HIGH (ref 6–20)
CO2: 22 mmol/L (ref 22–32)
Calcium: 7.8 mg/dL — ABNORMAL LOW (ref 8.9–10.3)
Chloride: 109 mmol/L (ref 98–111)
Creatinine, Ser: 1.55 mg/dL — ABNORMAL HIGH (ref 0.44–1.00)
GFR, Estimated: 38 mL/min — ABNORMAL LOW (ref >60)
Glucose, Bld: 111 mg/dL — ABNORMAL HIGH (ref 70–99)
Potassium: 3.3 mmol/L — ABNORMAL LOW (ref 3.5–5.1)
Sodium: 138 mmol/L (ref 135–145)
Total Bilirubin: 1.2 mg/dL (ref 0.3–1.2)
Total Protein: 4.2 g/dL — ABNORMAL LOW (ref 6.5–8.1)

## 2021-12-09 LAB — CBC WITH DIFFERENTIAL/PLATELET
Abs Immature Granulocytes: 0.25 10*3/uL — ABNORMAL HIGH (ref 0.00–0.07)
Basophils Absolute: 0.1 10*3/uL (ref 0.0–0.1)
Basophils Relative: 0 %
Eosinophils Absolute: 0.2 10*3/uL (ref 0.0–0.5)
Eosinophils Relative: 1 %
HCT: 23.6 % — ABNORMAL LOW (ref 36.0–46.0)
Hemoglobin: 8 g/dL — ABNORMAL LOW (ref 12.0–15.0)
Immature Granulocytes: 1 %
Lymphocytes Relative: 16 %
Lymphs Abs: 3.2 10*3/uL (ref 0.7–4.0)
MCH: 31.4 pg (ref 26.0–34.0)
MCHC: 33.9 g/dL (ref 30.0–36.0)
MCV: 92.5 fL (ref 80.0–100.0)
Monocytes Absolute: 3 10*3/uL — ABNORMAL HIGH (ref 0.1–1.0)
Monocytes Relative: 14 %
Neutro Abs: 14.2 10*3/uL — ABNORMAL HIGH (ref 1.7–7.7)
Neutrophils Relative %: 68 %
Platelets: 88 10*3/uL — ABNORMAL LOW (ref 150–400)
RBC: 2.55 MIL/uL — ABNORMAL LOW (ref 3.87–5.11)
RDW: 15.5 % (ref 11.5–15.5)
WBC: 20.9 10*3/uL — ABNORMAL HIGH (ref 4.0–10.5)
nRBC: 0 % (ref 0.0–0.2)

## 2021-12-09 LAB — VANCOMYCIN, RANDOM: Vancomycin Rm: 14 ug/mL

## 2021-12-09 LAB — MAGNESIUM: Magnesium: 1.9 mg/dL (ref 1.7–2.4)

## 2021-12-09 LAB — PHOSPHORUS: Phosphorus: 3.3 mg/dL (ref 2.5–4.6)

## 2021-12-09 MED ORDER — GADOBUTROL 1 MMOL/ML IV SOLN
9.0000 mL | Freq: Once | INTRAVENOUS | Status: AC | PRN
  Administered 2021-12-09: 9 mL via INTRAVENOUS

## 2021-12-09 MED ORDER — POTASSIUM CHLORIDE CRYS ER 10 MEQ PO TBCR
50.0000 meq | EXTENDED_RELEASE_TABLET | Freq: Once | ORAL | Status: AC
  Administered 2021-12-09: 50 meq via ORAL
  Filled 2021-12-09: qty 1

## 2021-12-09 MED ORDER — MAGNESIUM SULFATE 2 GM/50ML IV SOLN
2.0000 g | Freq: Once | INTRAVENOUS | Status: AC
  Administered 2021-12-09: 2 g via INTRAVENOUS
  Filled 2021-12-09: qty 50

## 2021-12-09 MED ORDER — VANCOMYCIN HCL 750 MG/150ML IV SOLN
750.0000 mg | Freq: Once | INTRAVENOUS | Status: AC
  Administered 2021-12-09: 750 mg via INTRAVENOUS
  Filled 2021-12-09: qty 150

## 2021-12-09 NOTE — Consult Note (Signed)
ORTHOPAEDIC CONSULTATION  REQUESTING PHYSICIAN: Allie Bossier, MD  Chief Complaint: Altered mental status with urinary tract infection.  HPI: Jessica Lynn is a 61 y.o. female who presents with altered mental status and urinary tract infection.  Patient has been treated for wounds bilateral lower extremities and there is a concern for necrotizing fasciitis.  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   Shingles    Snoring    Somatic dysfunction    Past Surgical History:  Procedure Laterality Date   DILATION AND CURETTAGE OF UTERUS     x2 w/ miscarriages   I & D EXTREMITY Right 10/30/2021   Procedure: IRRIGATION AND DEBRIDEMENT OF LEG;  Surgeon: [name], MD;  Location: [facility];  Service: Orthopedics;  Laterality: Right;   I & D EXTREMITY Right 11/01/2021   Procedure: DEBRIDEMENT RIGHT LEG, WOUND VAC CHANGE;  Surgeon: [name], MD;  Location: [facility];  Service: Orthopedics;  Laterality: Right;   TUBAL LIGATION     VAGINAL DELIVERY     x2   Social History   Socioeconomic History   Marital status: Married    Spouse name: Training and development officer   Number of children: Not on file   Years of education: Not on file   Highest education level: Not on file  Occupational History   Occupation: Control and instrumentation engineer    Employer: [employer]  Tobacco Use   Smoking status: Former    Packs/day: 0.20    Years: 4.00    Total pack years: 0.80    Types: Cigarettes    Quit date: 04/07/2006    Years since quitting: 15.6   Smokeless tobacco: Never  Vaping Use   Vaping Use: Never used  Substance and Sexual Activity   Alcohol use: No    Alcohol/week: 0.0  standard drinks of alcohol    Comment: social use   Drug use: No   Sexual activity: Yes    Birth control/protection: None  Other Topics Concern   Not on file  Social History Narrative   Not on file   Social Determinants of Health   Financial Resource Strain: Not on file  Food Insecurity: Not on file  Transportation Needs: Not on file  Physical Activity: Not on file  Stress: Not on file  Social Connections: Not on file   Family History  Problem Relation Age of Onset   Colon cancer Brother 88   Colon polyps Mother    Colon polyps Father    Colon cancer Maternal Uncle    Esophageal cancer Neg Hx    Rectal cancer Neg Hx    Stomach cancer Neg Hx    - negative except otherwise stated in the family history section Allergies  Allergen Reactions   Crestor [Rosuvastatin] Other (See Comments)    cramps   Oxycodone Itching   Prior to Admission medications   Medication Sig Start Date End Date Taking? Authorizing Provider  Cholecalciferol (VITAMIN D3) 2000 units capsule Take 1 capsule (2,000 Units total) by mouth daily. 12/15/16  Yes Plotnikov, Evie Lacks, MD  dorzolamide-timolol (COSOPT) 22.3-6.8 MG/ML ophthalmic solution Place 1 drop into the right eye 2 (two) times daily. 11/20/20  Yes [provider]  folic acid (FOLVITE) 1 MG tablet TAKE 1 TABLET(1 MG) BY MOUTH DAILY Patient taking differently: Take 1 mg by mouth daily. 05/20/21  Yes Plotnikov, Evie Lacks, MD  furosemide (LASIX) 40 MG tablet Take 40 mg by mouth daily.   Yes [provider]  HYDROcodone-acetaminophen (NORCO/VICODIN) 5-325 MG tablet Take 1 tablet by mouth 3 (three) times daily as needed for severe pain. 11/14/21 11/14/22 Yes Plotnikov, Evie Lacks, MD  hydrOXYzine (ATARAX/VISTARIL) 25 MG tablet TAKE 1 TABLET BY MOUTH EVERY 8 HOURS AS NEEDED FOR ITCHING Patient taking differently: Take 25 mg by mouth daily as needed for itching. 11/30/20  Yes Plotnikov, Evie Lacks, MD  methocarbamol (ROBAXIN) 500 MG tablet  Take 1 tablet (500 mg total) by mouth every 6 (six) hours as needed for muscle spasms. 11/05/21  Yes Donne Hazel, MD  montelukast (SINGULAIR) 10 MG tablet TAKE 1 TABLET(10 MG) BY MOUTH DAILY Patient taking differently: Take 10 mg by mouth daily in the afternoon. 12/24/20  Yes Plotnikov, Evie Lacks, MD  naproxen (NAPROSYN) 500 MG tablet TAKE 1 TABLET BY MOUTH TWICE DAILY WITH FOOD FOR MODERATE PAIN Patient taking differently: Take 500 mg by mouth 2 (two) times daily with a meal. 12/02/21  Yes Plotnikov, Evie Lacks, MD  SANTYL 250 UNIT/GM ointment Apply 1 Application topically daily. 10/16/21  Yes [provider]  zolpidem (AMBIEN) 10 MG tablet Take 1 tablet (10 mg total) by mouth at bedtime as needed for sleep. 11/14/21  Yes Plotnikov, Evie Lacks, MD  potassium chloride SA (KLOR-CON M) 20 MEQ tablet Take 1 tablet (20 mEq total) by mouth daily. Patient not taking: Reported on 12/08/2021 11/16/21   Plotnikov, Evie Lacks, MD  prednisoLONE acetate (PRED FORTE) 1 % ophthalmic suspension Place 1 drop into both eyes 4 (four) times daily. Patient not taking: Reported on 12/08/2021 01/20/21   [provider]  predniSONE (DELTASONE) 10 MG tablet Take 2 tablets (20 mg total) by mouth daily with breakfast. Patient not taking: Reported on 12/08/2021 11/21/21   [name], MD  Zinc Sulfate 220 (50 Zn) MG TABS Take 1 tablet (220 mg total) by mouth daily. Patient not taking: Reported on 12/08/2021 11/06/21   Donne Hazel, MD   DG Tibia/Fibula Right  Result Date: 12/08/2021 CLINICAL DATA:  Preprocedure.  Rule out metal in leg prior to MRI. EXAM: RIGHT TIBIA AND FIBULA - 2 VIEW COMPARISON:  Radiograph yesterday. Right lower extremity MRI 10/29/2021 FINDINGS: Stable skin irregularity about the lateral aspect of the lower leg. No radiopaque or metallic foreign body. There is no tracking soft tissue gas. Generalized subcutaneous edema. No fracture, periosteal reaction or bone destruction. Overlying dressing in  place. IMPRESSION: Stable radiograph from yesterday. Stable skin irregularity about the lateral aspect of the lower leg. No radiopaque or metallic foreign body. Generalized subcutaneous edema. Electronically Signed   By: Keith Rake M.D.   On: 12/08/2021 17:10   MR LUMBAR SPINE WO CONTRAST  Result Date: 12/08/2021 CLINICAL DATA:  Initial evaluation for low back pain, infection suspected. EXAM: MRI LUMBAR SPINE WITHOUT CONTRAST TECHNIQUE: Multiplanar, multisequence MR imaging of the lumbar spine was performed. No intravenous contrast was administered. COMPARISON:  Prior MRI from 08/31/2014. FINDINGS: Segmentation: Examination technically limited as the patient was unable to tolerate the full length of the exam. Sagittal sequences only were obtained. No axial images available for  review. Standard segmentation. Lowest well-formed disc space labeled the L5-S1 level. Alignment: 7 mm anterolisthesis of L4 on L5, likely chronic and facet mediated. Trace degenerative retrolisthesis of L1 on L2 and L2 on L3. Underlying mild scoliosis. Vertebrae: Vertebral body height maintained without acute or chronic fracture. Bone marrow signal intensity within normal limits. No discrete or worrisome osseous lesions. Mild reactive edema present about the L2-3 and L3-4 facets, favored to be degenerative in nature and due to facet arthritis. No other evidence for osteomyelitis discitis or other infection within the lumbar spine. Conus medullaris and cauda equina: Conus extends to the L1-2 level. Conus and cauda equina appear normal. Paraspinal and other soft tissues: Diffuse edema seen throughout the subcutaneous fat of the lower back, nonspecific. No visible loculated collections. Fibroid uterus noted. Disc levels: T12-L1: Mild disc bulge with endplate spurring. No significant stenosis. L1-2: Mild disc bulge with endplate spurring. Bilateral facet hypertrophy. No significant spinal stenosis. Foramina appear grossly patent. L2-3:  Degenerative intervertebral disc space narrowing with diffuse disc bulge and disc desiccation. Reactive endplate spurring. No significant spinal stenosis seen on this limited exam. Mild-to-moderate bilateral foraminal narrowing. L3-4: Degenerative intervertebral disc space narrowing with mild diffuse disc bulge and disc desiccation. Reactive endplate spurring. Moderate facet hypertrophy. No significant spinal stenosis on this limited exam. Mild-to-moderate bilateral facet hypertrophy. L4-5: Anterolisthesis. Disc desiccation with broad posterior pseudo disc bulge/uncovering. Moderate bilateral facet arthrosis. No significant spinal stenosis on this limited exam. Mild to moderate bilateral foraminal narrowing. L5-S1: No significant disc bulge. Mild facet hypertrophy. No significant stenosis. IMPRESSION: 1. Technically limited study due to the patient's inability to tolerate the full length of the exam. Sagittal sequences only were obtained. 2. Diffuse edema throughout the subcutaneous fat of the lower back. Finding is nonspecific, and could be related to overall volume status. Regional cellulitis could also have this appearance. Correlation with physical exam recommended. No loculated collections. 3. No other evidence for acute infection within the lumbar spine. 4. Underlying moderate multilevel degenerative spondylosis and facet arthrosis as above. No significant spinal stenosis evident on this limited exam. Mild-to-moderate bilateral foraminal narrowing at L2-3 through L4-5. Electronically Signed   By: Jeannine Boga M.D.   On: 12/08/2021 01:18   DG Tibia/Fibula Right  Result Date: 12/07/2021 CLINICAL DATA:  Chronic right lower extremity wound EXAM: RIGHT TIBIA AND FIBULA - 2 VIEW COMPARISON:  None Available. FINDINGS: Normal alignment. No acute fracture or dislocation. No osseous erosions or abnormal periosteal reaction. There is diffuse subcutaneous edema involving the right lower extremity. Shallow ulcer  noted along the medial aspect of the right lower extremity at the level of the mid diaphysis of the tibia and fibula. IMPRESSION: Soft tissue ulcer and diffuse subcutaneous edema. No radiographic evidence of osteomyelitis. Electronically Signed   By: Fidela Salisbury M.D.   On: 12/07/2021 20:05   DG Chest 2 View  Result Date: 12/07/2021 CLINICAL DATA:  Possible sepsis EXAM: CHEST - 2 VIEW COMPARISON:  10/28/2021 FINDINGS: Transverse diameter of heart is increased. This may be partly due to poor inspiration. There are no signs of alveolar pulmonary edema. Linear densities are seen in medial left lower lung field. There is no focal pulmonary consolidation. There is no significant pleural effusion or pneumothorax. IMPRESSION: New linear densities in the medial left lower lung fields suggest subsegmental atelectasis. There are no signs of pulmonary edema or focal pulmonary consolidation. Electronically Signed   By: Elmer Picker M.D.   On: 12/07/2021 18:19   - pertinent xrays,  CT, MRI studies were reviewed and independently interpreted  Positive ROS: All other systems have been reviewed and were otherwise negative with the exception of those mentioned in the HPI and as above.  Physical Exam: General: Alert, no acute distress Psychiatric: Patient is competent for consent with normal mood and affect Lymphatic: No axillary or cervical lymphadenopathy Cardiovascular: No pedal edema Respiratory: No cyanosis, no use of accessory musculature GI: No organomegaly, abdomen is soft and non-tender    Images:  '@ENCIMAGES'$ @  Labs:  Lab Results  Component Value Date   HGBA1C 5.5 09/16/2021   HGBA1C 5.8 (H) 12/16/2019   ESRSEDRATE 45 (H) 12/07/2021   ESRSEDRATE 47 (H) 10/29/2021   ESRSEDRATE 22 09/16/2021   CRP 14.2 (H) 12/07/2021   CRP 0.5 10/31/2021   CRP 0.6 10/29/2021   LABURIC 8.3 (H) 09/16/2021   LABURIC 5.6 07/26/2013   REPTSTATUS PENDING 12/07/2021   GRAMSTAIN  10/30/2021    ABUNDANT WBC  PRESENT, PREDOMINANTLY PMN RARE GRAM POSITIVE COCCI    CULT  12/07/2021    GRAM NEGATIVE RODS IDENTIFICATION TO FOLLOW Performed at [facility], [place] [address] [place], [place] [address]    LABORGA STAPHYLOCOCCUS AUREUS 10/30/2021    Lab Results  Component Value Date   ALBUMIN 1.6 (L) 12/09/2021   ALBUMIN 2.0 (L) 12/08/2021   ALBUMIN 2.3 (L) 12/07/2021   LABURIC 8.3 (H) 09/16/2021   LABURIC 5.6 07/26/2013        Latest Ref Rng & Units 12/09/2021    4:31 AM 12/08/2021    2:39 AM 12/07/2021    5:43 PM  CBC EXTENDED  WBC 4.0 - 10.5 K/uL 20.9  23.8  27.9   RBC 3.87 - 5.11 MIL/uL 2.55  2.88  3.01   Hemoglobin 12.0 - 15.0 g/dL 8.0  9.3  9.7   HCT 36.0 - 46.0 % 23.6  26.8  28.4   Platelets 150 - 400 K/uL 88  63  70   NEUT# 1.7 - 7.7 K/uL 14.2  18.8  22.0   Lymph# 0.7 - 4.0 K/uL 3.2  2.1  2.6     Neurologic: Patient does not have protective sensation bilateral lower extremities.   MUSCULOSKELETAL:   Skin: Examination of both lower extremities the wounds have healthy granulation tissue there is no new blistering no drainage.  Patient has no pain to palpation of the calf or thigh.  She has active range of motion of the ankle and toes no clinical signs of DVT.  Dorsiflexion of the ankle is not painful.  Patient's white cell count has dropped from her initial presentation.  Albumin 1.6.  Assessment: Assessment: Altered mental status with urinary tract infection with bilateral lower extremity wounds healing well no signs of necrotizing fasciitis.  Plan: Continue with serial wraps of both legs I will follow-up in the office and resume compression wraps.  Thank you for the consult and the opportunity to see Ms. Keith Rake, [place] [phone] 10:34 AM

## 2021-12-09 NOTE — Progress Notes (Signed)
Jessica Lynn PPI:[id number] DOB: June 29, 1960 DOA: 12/07/2021 PCP: Cassandria Anger, MD   Subj: 61 yo BF PMHx HTN,Dx of necrotizing fasciitis secondary to Staph aureus in her right leg in July 2023   Presents the ER today with a 4-day history of lethargy, altered mental status.  Daughter is at the bedside.  She states the patient's been sleeping a lot for the last 4 days.  Barely getting out of bed.  Is been eating and drinking very little.  She sleeps all the time according the daughter.  Patient had a fever today.  Was brought to urgent care and sent to the ER for evaluation.   Patient denies any dysuria.  She has had some urinary incontinence but the daughter thinks that this is due to the patient moving slow to the bathroom. Patient denies any nausea, vomiting.  No shortness of breath or chest pain.  No abdominal pain.  No flank pain.  She denies any dysuria.   On arrival to the ER, temp 101.3 heart rate 107 blood pressure 129/83.   White count 27.9, hemoglobin 9.7, platelets 70,000   Daughter states that she has been having patient's legs wrapped since discharge.  They continue to drain.   Right tib-fib x-ray showed soft tissue ulceration and subcutaneous edema.  No evidence of osteomyelitis.  There is no fractures.   Chest x-ray was negative for pulm edema.  There is subsegmental atelectasis.   Orthopedics has been consulted.   Patient's initial urine sample was obtained from bedside bedpan.      Obj: 9/4 Tmax 38c.  A/O x4, negative leg pain   Objective: VITAL SIGNS: Temp: 98.3 F (36.8 C) (09/04 0726) Temp Source: Oral (09/04 0726) BP: 149/94 (09/04 0726) Pulse Rate: 82 (09/04 0726) SPO2; FIO2:   Intake/Output Summary (Last 24 hours) at 12/09/2021 1641 Last data filed at 12/09/2021 1300 Gross per 24 hour  Intake 600.27 ml  Output 350 ml  Net 250.27 ml      Exam: General: A/O x4, No acute respiratory distress Lungs: Clear to auscultation bilaterally without  wheezes or crackles Cardiovascular: Regular rate and rhythm without murmur gallop or rub normal S1 and S2 Abdomen: Nontender, nondistended, soft, bowel sounds positive, no rebound, no ascites, no appreciable mass Extremities:          Skin:  Psychiatric:  Negative depression, negative anxiety, negative fatigue, negative mania  Central nervous system:  Cranial nerves II through XII intact, tongue/uvula midline, all extremities muscle strength 5/5, sensation intact throughout, negative dysarthria, negative expressive aphasia, negative receptive aphasia.  .  Mobility Assessment (last 72 hours)     Mobility Assessment     Row Name 12/09/21 0900 12/08/21 2130 12/08/21 0120       Does patient have an order for bedrest or is patient medically unstable No - Continue assessment No - Continue assessment No - Continue assessment     What is the highest level of mobility based on the progressive mobility assessment? Level 3 (Stands with assist) - Balance while standing  and cannot march in place Level 2 (Chairfast) - Balance while sitting on edge of bed and cannot stand Level 2 (Chairfast) - Balance while sitting on edge of bed and cannot stand     Is the above level different from baseline mobility prior to current illness? No - Consider discontinuing PT/OT Yes - Recommend PT order Yes - Recommend PT order  DVT prophylaxis: severe thrombocytopenia. Code Status: Full Family Communication: 9/4 son at bedside for discussion of plan of care all questions answered Status is: Inpatient    Dispo: The patient is from: Home              Anticipated d/c is to: Home              Anticipated d/c date is: > 3 days              Patient currently is not medically stable to d/c.    Procedures/Significant Events: 9/4 MRI RIGHT tibial/fibula W/W0 contrast Extensive right lower extremity cellulitis with multiple thin crescentic residual collections along the fascial surface of  the anterior and lateral leg, which appear to communicate with the skin surface, correlate with drainage. Of note, the anterior collection appears to communicate with the anterior muscular compartment via a fascial defect.   Nonspecific intramuscular edema throughout the lower leg, potentially infectious myositis of the anterior and lateral compartments.   No evidence of osteomyelitis.  Consultants:  Orthopedic surgery Dr. Sharol Given   Cultures 9/2 Blood positive E Coli 9/2 Blood positive E Coli     Antimicrobials: Anti-infectives (From admission, onward)    Start     Ordered Stop   12/08/21 0800  ceFEPIme (MAXIPIME) 2 g in sodium chloride 0.9 % 100 mL IVPB        12/08/21 0008     12/08/21 0008  vancomycin variable dose per unstable renal function (pharmacist dosing)        12/08/21 0008     12/08/21 0000  vancomycin (VANCOCIN) IVPB 1000 mg/200 mL premix        12/07/21 2316 12/08/21 0136   12/07/21 2200  linezolid (ZYVOX) IVPB 600 mg  Status:  Discontinued        12/07/21 2130 12/08/21 0145   12/07/21 1930  vancomycin (VANCOCIN) IVPB 1000 mg/200 mL premix        12/07/21 1923 12/07/21 2320   12/07/21 1930  ceFEPIme (MAXIPIME) 2 g in sodium chloride 0.9 % 100 mL IVPB        12/07/21 1923 12/07/21 2147   12/07/21 1915  cefTRIAXone (ROCEPHIN) 2 g in sodium chloride 0.9 % 100 mL IVPB  Status:  Discontinued        12/07/21 1912 12/07/21 1922        A/P  Sepsis with acute organ dysfunction without septic shock ([place]) -On admission meets sepsis criteria. - Antibiotics and fluids started in ED. - Orthopedics has come to evaluate the patient's right leg.  [name]  Positive E Coli Bacteremia -Patient will require minimum 7 days antibiotics.  AKI (acute kidney injury) [facility]) -Daughter states patient has not been taking p.o. fluids well.   -Hold all nephrotoxic medication  Lab Results  Component Value Date   CREATININE 1.55 (H) 12/09/2021   CREATININE 1.76 (H) 12/08/2021    CREATININE 1.80 (H) 12/07/2021   CREATININE 0.81 11/14/2021   CREATININE 0.66 11/05/2021  -Strict in and out +1.2 L - Daily weight Filed Weights   12/07/21 1715 12/08/21 0236 12/09/21 0435  Weight: 91.6 kg 91.6 kg 91.4 kg  - LR 167m/hr    Thrombocytopenia (HCC) -Hold heparin secondary to severe thrombocytopenia. -Most likely DIC -INR 1.4, PTT, 31 -PT 16.6 -D-dimer 2.42 -Fibrinogen 384 -,LDH 295,  -Haptoglobin, pending -Cytology (peripheral blood smear pending) pending   Necrotizing fasciitis ([place] -Patient with a Staph aureus necrotizing fasciitis at the end of July.   -Still  has drainage from her wounds.  Dressing changes per orthopedic recommendations. - MRI RIGHT  lower extremity negative osteomyelitis see MRI   Essential hypertension -Stable.  Hypokalemia - Potassium goal>4 -9/4 K-Dur 50 mEq  Hypomagnesium -Magnesium goal> 2 -9/4 MagnesiumIV 2g  Obesity (BMI 33.6 kg/m.) - Address with PCP    Care during the described time interval was provided by me .  I have reviewed this patient's available data, including medical history, events of note, physical examination, and all test results as part of my evaluation.

## 2021-12-09 NOTE — Progress Notes (Signed)
Pharmacy Antibiotic Note  Jessica Lynn is a 61 y.o. female admitted on 12/07/2021 with sepsis and wound infection.  Pharmacy has been consulted for vancomycin and cefepime dosing. Pt w/ AKI, baseline Scr <1, Scr improving 1.80 >> 1.55. Vancomycin random level 14. WBCs improving at 20.9, Tmax 100.   Plan: Vancomycin 750 mg x 1  Vancomycin random level 9/5 am, if kidney function continues to improve consider scheduling vancomycin Cefepime 2g IV q12h  F/u cultures, s/sx infection, length of tx, vancomycin levels as indicated   Height: '5\' 5"'$  (165.1 cm) Weight: 91.4 kg (201 lb 8 oz) IBW/kg (Calculated) : 57  Temp (24hrs), Avg:99 F (37.2 C), Min:98 F (36.7 C), Max:100 F (37.8 C)  Recent Labs  Lab 12/07/21 1743 12/08/21 0239 12/09/21 0431  WBC 27.9* 23.8* 20.9*  CREATININE 1.80* 1.76* 1.55*  LATICACIDVEN 1.8  --   --   VANCORANDOM  --   --  14    Estimated Creatinine Clearance: 43.7 mL/min (A) (by C-G formula based on SCr of 1.55 mg/dL (H)).    Allergies  Allergen Reactions   Crestor [Rosuvastatin] Other (See Comments)    cramps   Oxycodone Itching    Antimicrobials this admission: cefepime 9/2 >>  vancomycin 9/2 >>  linezolid 9/2 x 1    Microbiology results: 9/2 BCx: BCID E coli   Thank you for allowing pharmacy to be a part of this patient's care.  Eliseo Gum, PharmD PGY1 Pharmacy Resident   12/09/2021  7:45 AM

## 2021-12-09 NOTE — Progress Notes (Signed)
Pt presented to ED with lethargy, altered mental status. Admitted inpatient; Sepsis, AKI, E Coli Bacteremia, Thrombocytopenia ([place]), Necrotizing fasciitis ([place]). TOC will follow to assist with potential DC needs

## 2021-12-10 ENCOUNTER — Encounter: Payer: Self-pay | Admitting: Orthopedic Surgery

## 2021-12-10 DIAGNOSIS — A419 Sepsis, unspecified organism: Secondary | ICD-10-CM | POA: Diagnosis not present

## 2021-12-10 DIAGNOSIS — I10 Essential (primary) hypertension: Secondary | ICD-10-CM | POA: Diagnosis not present

## 2021-12-10 DIAGNOSIS — N179 Acute kidney failure, unspecified: Secondary | ICD-10-CM | POA: Diagnosis not present

## 2021-12-10 DIAGNOSIS — E876 Hypokalemia: Secondary | ICD-10-CM | POA: Diagnosis not present

## 2021-12-10 LAB — COMPREHENSIVE METABOLIC PANEL
ALT: 56 U/L — ABNORMAL HIGH (ref 0–44)
AST: 126 U/L — ABNORMAL HIGH (ref 15–41)
Albumin: 1.6 g/dL — ABNORMAL LOW (ref 3.5–5.0)
Alkaline Phosphatase: 191 U/L — ABNORMAL HIGH (ref 38–126)
Anion gap: 7 (ref 5–15)
BUN: 19 mg/dL (ref 6–20)
CO2: 21 mmol/L — ABNORMAL LOW (ref 22–32)
Calcium: 7.9 mg/dL — ABNORMAL LOW (ref 8.9–10.3)
Chloride: 109 mmol/L (ref 98–111)
Creatinine, Ser: 1.48 mg/dL — ABNORMAL HIGH (ref 0.44–1.00)
GFR, Estimated: 41 mL/min — ABNORMAL LOW (ref >60)
Glucose, Bld: 109 mg/dL — ABNORMAL HIGH (ref 70–99)
Potassium: 3.4 mmol/L — ABNORMAL LOW (ref 3.5–5.1)
Sodium: 137 mmol/L (ref 135–145)
Total Bilirubin: 1.2 mg/dL (ref 0.3–1.2)
Total Protein: 4.6 g/dL — ABNORMAL LOW (ref 6.5–8.1)

## 2021-12-10 LAB — CULTURE, BLOOD (ROUTINE X 2)
Special Requests: ADEQUATE
Special Requests: ADEQUATE

## 2021-12-10 LAB — CBC WITH DIFFERENTIAL/PLATELET
Abs Immature Granulocytes: 0.43 10*3/uL — ABNORMAL HIGH (ref 0.00–0.07)
Basophils Absolute: 0.1 10*3/uL (ref 0.0–0.1)
Basophils Relative: 0 %
Eosinophils Absolute: 0.2 10*3/uL (ref 0.0–0.5)
Eosinophils Relative: 1 %
HCT: 25.5 % — ABNORMAL LOW (ref 36.0–46.0)
Hemoglobin: 8.4 g/dL — ABNORMAL LOW (ref 12.0–15.0)
Immature Granulocytes: 2 %
Lymphocytes Relative: 15 %
Lymphs Abs: 3.2 10*3/uL (ref 0.7–4.0)
MCH: 30.7 pg (ref 26.0–34.0)
MCHC: 32.9 g/dL (ref 30.0–36.0)
MCV: 93.1 fL (ref 80.0–100.0)
Monocytes Absolute: 2.9 10*3/uL — ABNORMAL HIGH (ref 0.1–1.0)
Monocytes Relative: 14 %
Neutro Abs: 14.9 10*3/uL — ABNORMAL HIGH (ref 1.7–7.7)
Neutrophils Relative %: 68 %
Platelets: 142 10*3/uL — ABNORMAL LOW (ref 150–400)
RBC: 2.74 MIL/uL — ABNORMAL LOW (ref 3.87–5.11)
RDW: 15.6 % — ABNORMAL HIGH (ref 11.5–15.5)
WBC: 21.8 10*3/uL — ABNORMAL HIGH (ref 4.0–10.5)
nRBC: 0 % (ref 0.0–0.2)

## 2021-12-10 LAB — VANCOMYCIN, RANDOM: Vancomycin Rm: 14 ug/mL

## 2021-12-10 LAB — PHOSPHORUS: Phosphorus: 3 mg/dL (ref 2.5–4.6)

## 2021-12-10 LAB — HAPTOGLOBIN: Haptoglobin: 165 mg/dL (ref 33–346)

## 2021-12-10 LAB — MAGNESIUM: Magnesium: 1.9 mg/dL (ref 1.7–2.4)

## 2021-12-10 MED ORDER — MAGNESIUM SULFATE 4 GM/100ML IV SOLN
4.0000 g | Freq: Once | INTRAVENOUS | Status: AC
  Administered 2021-12-10: 4 g via INTRAVENOUS
  Filled 2021-12-10: qty 100

## 2021-12-10 MED ORDER — VANCOMYCIN HCL IN DEXTROSE 1-5 GM/200ML-% IV SOLN
1000.0000 mg | Freq: Once | INTRAVENOUS | Status: DC
Start: 2021-12-10 — End: 2021-12-10
  Administered 2021-12-10: 1000 mg via INTRAVENOUS
  Filled 2021-12-10: qty 200

## 2021-12-10 MED ORDER — CEFAZOLIN SODIUM-DEXTROSE 2-4 GM/100ML-% IV SOLN
2.0000 g | Freq: Three times a day (TID) | INTRAVENOUS | Status: DC
  Administered 2021-12-10 – 2021-12-12 (×5): 2 g via INTRAVENOUS
  Filled 2021-12-10 (×6): qty 100

## 2021-12-10 MED ORDER — POTASSIUM CHLORIDE CRYS ER 20 MEQ PO TBCR
50.0000 meq | EXTENDED_RELEASE_TABLET | Freq: Once | ORAL | Status: AC
Start: 2021-12-10 — End: 2021-12-10
  Administered 2021-12-10: 50 meq via ORAL
  Filled 2021-12-10: qty 1

## 2021-12-10 NOTE — Progress Notes (Signed)
Jessica Lynn KCM:[id number] DOB: 1961-02-04 DOA: 12/07/2021 PCP: Cassandria Anger, MD   Subj: 61 yo BF PMHx HTN,Dx of necrotizing fasciitis secondary to Staph aureus in her right leg in July 2023   Presents the ER today with a 4-day history of lethargy, altered mental status.  Daughter is at the bedside.  She states the patient's been sleeping a lot for the last 4 days.  Barely getting out of bed.  Is been eating and drinking very little.  She sleeps all the time according the daughter.  Patient had a fever today.  Was brought to urgent care and sent to the ER for evaluation.   Patient denies any dysuria.  She has had some urinary incontinence but the daughter thinks that this is due to the patient moving slow to the bathroom. Patient denies any nausea, vomiting.  No shortness of breath or chest pain.  No abdominal pain.  No flank pain.  She denies any dysuria.   On arrival to the ER, temp 101.3 heart rate 107 blood pressure 129/83.   White count 27.9, hemoglobin 9.7, platelets 70,000   Daughter states that she has been having patient's legs wrapped since discharge.  They continue to drain.   Right tib-fib x-ray showed soft tissue ulceration and subcutaneous edema.  No evidence of osteomyelitis.  There is no fractures.   Chest x-ray was negative for pulm edema.  There is subsegmental atelectasis.   Orthopedics has been consulted.   Patient's initial urine sample was obtained from bedside bedpan.      Obj: 9/5 afebrile overnight A/O x4, negative lower extremity pain    Objective: VITAL SIGNS: Temp: 99.1 F (37.3 C) (09/05 1129) Temp Source: Oral (09/05 1129) BP: 144/99 (09/05 1129) Pulse Rate: 87 (09/05 1129) SPO2; FIO2:   Intake/Output Summary (Last 24 hours) at 12/10/2021 1252 Last data filed at 12/10/2021 1246 Gross per 24 hour  Intake 2459.48 ml  Output 1450 ml  Net 1009.48 ml      Exam: General: A/O x4, No acute respiratory distress Lungs: Clear to  auscultation bilaterally without wheezes or crackles Cardiovascular: Regular rate and rhythm without murmur gallop or rub normal S1 and S2 Abdomen: Nontender, nondistended, soft, bowel sounds positive, no rebound, no ascites, no appreciable mass Extremities:          Skin:  Psychiatric:  Negative depression, negative anxiety, negative fatigue, negative mania  Central nervous system:  Cranial nerves II through XII intact, tongue/uvula midline, all extremities muscle strength 5/5, sensation intact throughout, negative dysarthria, negative expressive aphasia, negative receptive aphasia.  .  Mobility Assessment (last 72 hours)     Mobility Assessment     Row Name 12/10/21 1006 12/10/21 0026 12/09/21 2030 12/09/21 0900 12/08/21 2130   Does patient have an order for bedrest or is patient medically unstable No - Continue assessment No - Continue assessment No - Continue assessment No - Continue assessment No - Continue assessment   What is the highest level of mobility based on the progressive mobility assessment? Level 3 (Stands with assist) - Balance while standing  and cannot march in place Level 4 (Walks with assist in room) - Balance while marching in place and cannot step forward and back - Complete Level 4 (Walks with assist in room) - Balance while marching in place and cannot step forward and back - Complete Level 3 (Stands with assist) - Balance while standing  and cannot march in place Level 2 (Chairfast) - Balance while sitting on  edge of bed and cannot stand   Is the above level different from baseline mobility prior to current illness? No - Consider discontinuing PT/OT Yes - Recommend PT order Yes - Recommend PT order No - Consider discontinuing PT/OT Yes - Recommend PT order    [place] Name 12/08/21 0120           Does patient have an order for bedrest or is patient medically unstable No - Continue assessment       What is the highest level of mobility based on the progressive  mobility assessment? Level 2 (Chairfast) - Balance while sitting on edge of bed and cannot stand       Is the above level different from baseline mobility prior to current illness? Yes - Recommend PT order                  DVT prophylaxis: severe thrombocytopenia. Code Status: Full Family Communication: 9/4 son at bedside for discussion of plan of care all questions answered Status is: Inpatient    Dispo: The patient is from: Home              Anticipated d/c is to: Home              Anticipated d/c date is: > 3 days              Patient currently is not medically stable to d/c.    Procedures/Significant Events: 9/4 MRI RIGHT tibial/fibula W/W0 contrast Extensive right lower extremity cellulitis with multiple thin crescentic residual collections along the fascial surface of the anterior and lateral leg, which appear to communicate with the skin surface, correlate with drainage. Of note, the anterior collection appears to communicate with the anterior muscular compartment via a fascial defect.   Nonspecific intramuscular edema throughout the lower leg, potentially infectious myositis of the anterior and lateral compartments.   No evidence of osteomyelitis.  Consultants:  Orthopedic surgery Dr. Sharol Given   Cultures 9/2 Blood positive E Coli 9/2 Blood positive E Coli     Antimicrobials: Anti-infectives (From admission, onward)    Start     Ordered Stop   12/10/21 2200  ceFAZolin (ANCEF) IVPB 2g/100 mL premix        12/10/21 1507     12/10/21 1245  vancomycin (VANCOCIN) IVPB 1000 mg/200 mL premix  Status:  Discontinued        12/10/21 1148 12/10/21 1506   12/09/21 0900  vancomycin (VANCOREADY) IVPB 750 mg/150 mL        12/09/21 0755 12/09/21 1305   12/08/21 0800  ceFEPIme (MAXIPIME) 2 g in sodium chloride 0.9 % 100 mL IVPB  Status:  Discontinued        12/08/21 0008 12/10/21 1506   12/08/21 0008  vancomycin variable dose per unstable renal function (pharmacist  dosing)  Status:  Discontinued        12/08/21 0008 12/10/21 1506   12/08/21 0000  vancomycin (VANCOCIN) IVPB 1000 mg/200 mL premix        12/07/21 2316 12/08/21 0136   12/07/21 2200  linezolid (ZYVOX) IVPB 600 mg  Status:  Discontinued        12/07/21 2130 12/08/21 0145   12/07/21 1930  vancomycin (VANCOCIN) IVPB 1000 mg/200 mL premix        12/07/21 1923 12/07/21 2320   12/07/21 1930  ceFEPIme (MAXIPIME) 2 g in sodium chloride 0.9 % 100 mL IVPB        12/07/21 1923 12/07/21 2147  12/07/21 1915  cefTRIAXone (ROCEPHIN) 2 g in sodium chloride 0.9 % 100 mL IVPB  Status:  Discontinued        12/07/21 1912 12/07/21 1922          A/P  Sepsis with acute organ dysfunction without septic shock ([place]) -On admission meets sepsis criteria. - Antibiotics and fluids started in ED. - Orthopedics has come to evaluate the patient's right leg.  [name]  Positive E Coli Bacteremia -Patient will require minimum 7 days antibiotics.  AKI (acute kidney injury) [facility]) -Daughter states patient has not been taking p.o. fluids well.   -Hold all nephrotoxic medication  Lab Results  Component Value Date   CREATININE 1.48 (H) 12/10/2021   CREATININE 1.55 (H) 12/09/2021   CREATININE 1.76 (H) 12/08/2021   CREATININE 1.80 (H) 12/07/2021   CREATININE 0.81 11/14/2021  -Strict in and out +1.9 L - Daily weight Filed Weights   12/08/21 0236 12/09/21 0435 12/10/21 0335  Weight: 91.6 kg 91.4 kg 92.5 kg  - LR 125m/hr    Thrombocytopenia (HCC) -Hold heparin secondary to severe thrombocytopenia. -Most likely DIC -INR 1.4, PTT, 31 -PT 16.6 -D-dimer 2.42 -Fibrinogen 384 -,LDH 295,  -Haptoglobin, pending -Cytology (peripheral blood smear pending) pending   Necrotizing fasciitis ([place] -Patient with a Staph aureus necrotizing fasciitis at the end of July.   -Still has drainage from her wounds.  Dressing changes per orthopedic recommendations. - MRI RIGHT  lower extremity negative osteomyelitis see MRI -9/5  Per Dr. DSharol Givenorthopedic surgery no limitation on patient's physical therapy.   Essential hypertension -Stable.  Hypokalemia - Potassium goal>4 - 9/5 K-Dur 50 mEq  Hypomagnesium -Magnesium goal> 2 - 9/5 MagnesiumIV 4g  Obesity (BMI 33.6 kg/m.) - Address with PCP   9/5 PT/OT consult: Patient with extensive bilateral lower extremity wound with debridement by Dr. DSharol Given  Bacteremia, weakness secondary to multiple medical problems would be a good candidate for CIR please evaluate    Care during the described time interval was provided by me .  I have reviewed this patient's available data, including medical history, events of note, physical examination, and all test results as part of my evaluation.

## 2021-12-10 NOTE — Progress Notes (Signed)
Dressing changes complete as ordered, dtg at bedside with pt and staying with pt on tonight. SRP, RN

## 2021-12-10 NOTE — Plan of Care (Signed)

## 2021-12-10 NOTE — Evaluation (Signed)
Physical Therapy Evaluation Patient Details Name: Jessica Lynn MRN: [id number] DOB: 10-31-1960 Today's Date: 12/10/2021  History of Present Illness  Pt is 61 yo female admitted with sepsis on 12/07/21.   Additionally pt with +ecoli bacteremia and AKI.  She had recent I and D (July 2023) for necrotizing fascitis in R leg.  Other hx includes GERD, HCL, mild HTN.  Clinical Impression  Pt admitted with above diagnosis. At baseline, pt completely independent and working. Since last admission in July she was staying with daughter but progressing well (back to no RW for ambulation).  Today, pt able to transfer with min guard and light min A for R LE back to bed. She ambulated 300' with RW  and min guard.  Pt expected to progress well with therapy. Pt currently with functional limitations due to the deficits listed below (see PT Problem List). Pt will benefit from skilled PT to increase their independence and safety with mobility to allow discharge to the venue listed below.          Recommendations for follow up therapy are one component of a multi-disciplinary discharge planning process, led by the attending physician.  Recommendations may be updated based on patient status, additional functional criteria and insurance authorization.  Follow Up Recommendations Home health PT (vs outpt PT if has transportation)      Assistance Recommended at Discharge Intermittent Supervision/Assistance  Patient can return home with the following  A little help with walking and/or transfers;A little help with bathing/dressing/bathroom;Assistance with cooking/housework;Help with stairs or ramp for entrance    Equipment Recommendations None recommended by PT  Recommendations for Other Services       Functional Status Assessment Patient has had a recent decline in their functional status and demonstrates the ability to make significant improvements in function in a reasonable and predictable amount of time.      Precautions / Restrictions Precautions Precautions: Fall      Mobility  Bed Mobility Overal bed mobility: Needs Assistance Bed Mobility: Rolling, Supine to Sit, Sit to Supine Rolling: Independent   Supine to sit: Supervision Sit to supine: Min assist   General bed mobility comments: light min A for legs back to bed    Transfers Overall transfer level: Needs assistance Equipment used: None Transfers: Sit to/from Stand Sit to Stand: Min guard           General transfer comment: min guard for safety; increased time to rise    Ambulation/Gait Ambulation/Gait assistance: Min guard Gait Distance (Feet): 300 Feet Assistive device: Rolling walker (2 wheels) Gait Pattern/deviations: Step-through pattern, Decreased stride length, Wide base of support Gait velocity: decreased     General Gait Details: Slightly increased BOS and slow gait pattern; good use of RW  Stairs            Wheelchair Mobility    Modified Rankin (Stroke Patients Only)       Balance Overall balance assessment: Needs assistance Sitting-balance support: No upper extremity supported Sitting balance-[name]: Good     Standing balance support: No upper extremity supported, Bilateral upper extremity supported Standing balance-[name]: Fair Standing balance comment: Could stand without support; RW to ambulate for pain control                             Pertinent Vitals/Pain Pain Assessment Pain Assessment: 0-10 Pain Score: 3  Pain Location: legs Pain Descriptors / Indicators: Discomfort Pain Intervention(s): Limited activity  within patient's tolerance, Monitored during session    Home Living Family/patient expects to be discharged to:: Private residence Living Arrangements: Spouse/significant other (plans to go home with dtr) Available Help at Discharge: Family;Available 24 hours/day Type of Home: House Home Access: Stairs to enter Entrance Stairs-Rails:  None Entrance Stairs-Number of Steps: 1   Home Layout: Multi-level;Able to live on main level with bedroom/bathroom Home Equipment: Rolling Walker (2 wheels);BSC/3in1      Prior Function Prior Level of Function : Independent/Modified Independent;Driving;Working/employed             Mobility Comments: normally ambulated without difficulty; After last admission used RW for a few weeks but had progressed to no AD ADLs Comments: Prior to [place] 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; since last admission stayed with dtr but able to do ADLs     Hand Dominance        Extremity/Trunk Assessment   Upper Extremity Assessment Upper Extremity Assessment: Overall WFL for tasks assessed    Lower Extremity Assessment Lower Extremity Assessment: Generalized weakness (Bil LE : ROM WFL; MMT at least 3/5 but not further tested due to wounds)    Cervical / Trunk Assessment Cervical / Trunk Assessment: Normal  Communication   Communication: No difficulties  Cognition Arousal/Alertness: Awake/alert Behavior During Therapy: WFL for tasks assessed/performed Overall Cognitive Status: Within Functional Limits for tasks assessed                                          General Comments      Exercises     Assessment/Plan    PT Assessment Patient needs continued PT services  PT Problem List Decreased strength;Decreased mobility;Decreased activity tolerance;Decreased balance;Decreased knowledge of use of DME;Pain       PT Treatment Interventions DME instruction;Therapeutic activities;Gait training;Therapeutic exercise;Patient/family education;Stair training;Balance training;Functional mobility training    PT Goals (Current goals can be found in the Care Plan section)  Acute Rehab PT Goals Patient Stated Goal: return to daughter's house PT Goal Formulation: With patient/family Time For Goal Achievement: 12/24/21 Potential to Achieve Goals: Good     Frequency Min 3X/week     Co-evaluation               AM-PAC PT "6 Clicks" Mobility  Outcome Measure Help needed turning from your back to your side while in a flat bed without using bedrails?: None Help needed moving from lying on your back to sitting on the side of a flat bed without using bedrails?: A Little Help needed moving to and from a bed to a chair (including a wheelchair)?: A Little Help needed standing up from a chair using your arms (e.g., wheelchair or bedside chair)?: A Little Help needed to walk in hospital room?: A Little Help needed climbing 3-5 steps with a railing? : A Little 6 Click Score: 19    End of Session Equipment Utilized During Treatment: Gait belt Activity Tolerance: Patient tolerated treatment well Patient left: in bed;with call bell/phone within reach Nurse Communication: Mobility status PT Visit Diagnosis: Other abnormalities of gait and mobility (R26.89);Muscle weakness (generalized) (M62.81)    Time: 6387-5643 PT Time Calculation (min) (ACUTE ONLY): 27 min   Charges:   PT Evaluation $PT Eval Low Complexity: 1 Low PT Treatments $Gait Training: 8-22 mins        Abran Richard, PT Acute Rehab [employer] [place]  H Damyah Gugel 12/10/2021, 5:20 PM
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
## 2021-12-11 ENCOUNTER — Inpatient Hospital Stay (HOSPITAL_COMMUNITY): Payer: BC Managed Care – PPO

## 2021-12-11 DIAGNOSIS — N179 Acute kidney failure, unspecified: Secondary | ICD-10-CM | POA: Diagnosis not present

## 2021-12-11 DIAGNOSIS — A419 Sepsis, unspecified organism: Secondary | ICD-10-CM | POA: Diagnosis not present

## 2021-12-11 DIAGNOSIS — R6520 Severe sepsis without septic shock: Secondary | ICD-10-CM | POA: Diagnosis not present

## 2021-12-11 LAB — CBC WITH DIFFERENTIAL/PLATELET
Abs Immature Granulocytes: 0.4 10*3/uL — ABNORMAL HIGH (ref 0.00–0.07)
Basophils Absolute: 0.1 10*3/uL (ref 0.0–0.1)
Basophils Relative: 0 %
Eosinophils Absolute: 0.2 10*3/uL (ref 0.0–0.5)
Eosinophils Relative: 1 %
HCT: 26.3 % — ABNORMAL LOW (ref 36.0–46.0)
Hemoglobin: 8.8 g/dL — ABNORMAL LOW (ref 12.0–15.0)
Immature Granulocytes: 2 %
Lymphocytes Relative: 11 %
Lymphs Abs: 2.7 10*3/uL (ref 0.7–4.0)
MCH: 31.8 pg (ref 26.0–34.0)
MCHC: 33.5 g/dL (ref 30.0–36.0)
MCV: 94.9 fL (ref 80.0–100.0)
Monocytes Absolute: 2.8 10*3/uL — ABNORMAL HIGH (ref 0.1–1.0)
Monocytes Relative: 11 %
Neutro Abs: 18.8 10*3/uL — ABNORMAL HIGH (ref 1.7–7.7)
Neutrophils Relative %: 75 %
Platelets: 233 10*3/uL (ref 150–400)
RBC: 2.77 MIL/uL — ABNORMAL LOW (ref 3.87–5.11)
RDW: 15.6 % — ABNORMAL HIGH (ref 11.5–15.5)
WBC: 24.9 10*3/uL — ABNORMAL HIGH (ref 4.0–10.5)
nRBC: 0 % (ref 0.0–0.2)

## 2021-12-11 LAB — COMPREHENSIVE METABOLIC PANEL
ALT: 70 U/L — ABNORMAL HIGH (ref 0–44)
AST: 122 U/L — ABNORMAL HIGH (ref 15–41)
Albumin: 1.6 g/dL — ABNORMAL LOW (ref 3.5–5.0)
Alkaline Phosphatase: 194 U/L — ABNORMAL HIGH (ref 38–126)
Anion gap: 5 (ref 5–15)
BUN: 17 mg/dL (ref 6–20)
CO2: 23 mmol/L (ref 22–32)
Calcium: 7.9 mg/dL — ABNORMAL LOW (ref 8.9–10.3)
Chloride: 109 mmol/L (ref 98–111)
Creatinine, Ser: 1.42 mg/dL — ABNORMAL HIGH (ref 0.44–1.00)
GFR, Estimated: 43 mL/min — ABNORMAL LOW (ref >60)
Glucose, Bld: 101 mg/dL — ABNORMAL HIGH (ref 70–99)
Potassium: 3.7 mmol/L (ref 3.5–5.1)
Sodium: 137 mmol/L (ref 135–145)
Total Bilirubin: 1.3 mg/dL — ABNORMAL HIGH (ref 0.3–1.2)
Total Protein: 4.8 g/dL — ABNORMAL LOW (ref 6.5–8.1)

## 2021-12-11 LAB — URINALYSIS, ROUTINE W REFLEX MICROSCOPIC
Bilirubin Urine: NEGATIVE
Glucose, UA: NEGATIVE mg/dL
Ketones, ur: NEGATIVE mg/dL
Nitrite: NEGATIVE
Protein, ur: NEGATIVE mg/dL
Specific Gravity, Urine: 1.006 (ref 1.005–1.030)
pH: 6 (ref 5.0–8.0)

## 2021-12-11 LAB — MAGNESIUM: Magnesium: 2.3 mg/dL (ref 1.7–2.4)

## 2021-12-11 LAB — PHOSPHORUS: Phosphorus: 3.4 mg/dL (ref 2.5–4.6)

## 2021-12-11 LAB — PATHOLOGIST SMEAR REVIEW

## 2021-12-11 MED ORDER — SODIUM CHLORIDE 0.9 % IV SOLN: INTRAVENOUS | Status: DC

## 2021-12-11 MED ORDER — IOHEXOL 9 MG/ML PO SOLN
500.0000 mL | ORAL | Status: AC
  Administered 2021-12-11: 500 mL via ORAL

## 2021-12-11 NOTE — Progress Notes (Signed)
   12/11/21 1639  Mobility  Activity Ambulated with assistance in hallway  Level of Assistance Standby assist, set-up cues, supervision of patient - no hands on  Assistive Device Front wheel walker  Distance Ambulated (ft) 240 ft  Activity Response Tolerated well  $Mobility charge 1 Mobility   Mobility Specialist Progress Note  Received pt in bathroom having no complaints and agreeable to mobility. Pt was asymptomatic throughout ambulation and returned to room w/o fault. Left EOB w/ call bell in reach and all needs met.   Lucious Groves Mobility Specialist

## 2021-12-11 NOTE — Progress Notes (Addendum)
PROGRESS NOTE    Jessica Lynn  JGG:[id number] DOB: 10-21-1960 DOA: 12/07/2021 PCP: Cassandria Anger, MD   Brief Narrative: 61 year old with past medical history significant for hypertension, necrotizing fasciitis secondary to Staph aureus in the right leg in July 2023 presented to the ED with 4 days history of lethargy, altered mental status.  Patient has been more sleepy for the last 4 days.  Not eating as much or drinking as much.  Patient also had a fever.  Patient presented with tachycardia heart rate 107, temperature 101, white count 27, right tibia-fibula x-ray shows soft tissue ulceration and subcutaneous edema.  No evidence of osteomyelitis.  Chest x-ray negative for pulmonary edema.  Patient admitted with sepsis secondary to E. coli bacteremia. Leukocytosis  persist, plan to panculture, check renal ultrasound repeat UA   Assessment & Plan:   Principal Problem:   Sepsis with acute organ dysfunction without septic shock (HCC) Active Problems:   AKI (acute kidney injury) ([place])   Essential hypertension   Necrotizing fasciitis (HCC)   Thrombocytopenia (HCC)   Hypokalemia   Obesity (BMI 30-39.9)   Multiple open wounds of right lower extremity  1-Sepsis with acute organ dysfunction.  Secondary to UTI.  -Continue with IV antibiotics. Ancef.  -Leukocytosis worse today. Plan to check UA, Blood culture, Renal US.  -LE infection appears improved. MRI was negative for osteomyelitis.  -MRI LE; Extensive right lower extremity cellulitis with multiple thin crescentic residual collections along the fascial surface of the anterior and lateral leg, which appear to communicate with the skin surface, correlate with drainage. Of note, the anterior collection appears to communicate with the anterior muscular compartment via a fascial defect. Nonspecific intramuscular edema throughout the lower leg, potentially infectious myositis of the anterior and lateral compartments. No evidence of  osteomyelitis. -Evaluated by Dr Sharol Given who recommend local care, follow up in the office.     2-E. coli bacteremia: Leukocytosis persists, plan to check blood cultures. Suspect secondary to UTI. Urine culture initially not send.  Will order urine culture.  Renal US negative, transaminases, leukocytosis , will proceed with CT abdomen.   AKI: IV fluid  Thrombocytopenia;  Resolved,. In setting of infection.  Haptoglobin normal.  Path review anemia neutrophilia with monocytosis, favor reactive  Bilateral lower extremity wounds: Evaluated by Dr. Sharol Given who recommended serial wraps of both legs and follow-up in the office to resume compression wraps.  No evidence of current necrotizing fasciitis On IV Ancef  Hypokalemia: Resolved Hypomagnesemia: Resolved Obesity; need lifestyle modification  Estimated body mass index is 35.11 kg/m as calculated from the following:   Height as of this encounter: '5\' 5"'$  (1.651 m).   Weight as of this encounter: 95.7 kg.   DVT prophylaxis: Not on Lovenox due to recent thrombocytopenia.  Will consider restarting anticoagulation tomorrow Code Status: Full code Family Communication: Care discussed with patient Disposition Plan:  Status is: Inpatient Remains inpatient appropriate because: management of sepsis.     Consultants:  Dr Sharol Given.   Procedures:    Antimicrobials:    Subjective: She denies worsening cough,. Diarrhea.    Objective: Vitals:   12/10/21 1129 12/10/21 1825 12/10/21 2014 12/11/21 0437  BP: (!) 144/99 (!) 154/90 (!) 141/86 (!) 146/90  Pulse: 87 85 92 85  Resp: 20 19 (!) 24 (!) 22  Temp: 99.1 F (37.3 C) 98.1 F (36.7 C) 98.5 F (36.9 C) 98.5 F (36.9 C)  TempSrc: Oral Oral Oral Oral  SpO2: 99% 100% 100% 100%  Weight:  95.7 kg  Height:        Intake/Output Summary (Last 24 hours) at 12/11/2021 0809 Last data filed at 12/11/2021 0500 Gross per 24 hour  Intake 0 ml  Output 1950 ml  Net -1950 ml   Filed Weights    12/09/21 0435 12/10/21 0335 12/11/21 0437  Weight: 91.4 kg 92.5 kg 95.7 kg    Examination:  General exam: Appears calm and comfortable  Respiratory system: Clear to auscultation. Respiratory effort normal. Cardiovascular system: S1 & S2 heard, RRR. No JVD, murmurs, rubs, gallops or clicks. No pedal edema. Gastrointestinal system: Abdomen is nondistended, soft and nontender. No organomegaly or masses felt. Normal bowel sounds heard. Central nervous system: Alert and oriented. No focal neurological deficits. Extremities: Symmetric 5 x 5 power.  Data Reviewed: I have personally reviewed following labs and imaging studies  CBC: Recent Labs  Lab 12/07/21 1743 12/08/21 0239 12/09/21 0431 12/10/21 0240  WBC 27.9* 23.8* 20.9* 21.8*  NEUTROABS 22.0* 18.8* 14.2* 14.9*  HGB 9.7* 9.3* 8.0* 8.4*  HCT 28.4* 26.8* 23.6* 25.5*  MCV 94.4 93.1 92.5 93.1  PLT 70* 63* 88* 841*   Basic Metabolic Panel: Recent Labs  Lab 12/07/21 1743 12/07/21 2016 12/08/21 0239 12/08/21 0914 12/09/21 0431 12/10/21 0240  NA 138  --  136  --  138 137  K 2.9*  --  2.8*  --  3.3* 3.4*  CL 108  --  105  --  109 109  CO2 23  --  22  --  22 21*  GLUCOSE 99  --  102*  --  111* 109*  BUN 22*  --  22*  --  21* 19  CREATININE 1.80*  --  1.76*  --  1.55* 1.48*  CALCIUM 8.4*  --  8.1*  --  7.8* 7.9*  MG  --  1.6* 2.1 2.1 1.9 1.9  PHOS  --   --   --  3.3 3.3 3.0   GFR: Estimated Creatinine Clearance: 46.8 mL/min (A) (by C-G formula based on SCr of 1.48 mg/dL (H)). Liver Function Tests: Recent Labs  Lab 12/07/21 1743 12/08/21 0239 12/09/21 0431 12/10/21 0240  AST 26 39 46* 126*  ALT '15 18 23 '$ 56*  ALKPHOS 159* 157* 137* 191*  BILITOT 1.6* 1.7* 1.2 1.2  PROT 5.2* 4.5* 4.2* 4.6*  ALBUMIN 2.3* 2.0* 1.6* 1.6*   No results for input(s): "LIPASE", "AMYLASE" in the last 168 hours. No results for input(s): "AMMONIA" in the last 168 hours. Coagulation Profile: Recent Labs  Lab 12/07/21 1743 12/08/21 1821   INR 1.4* 1.4*   Cardiac Enzymes: No results for input(s): "CKTOTAL", "CKMB", "CKMBINDEX", "TROPONINI" in the last 168 hours. BNP (last 3 results) No results for input(s): "PROBNP" in the last 8760 hours. HbA1C: No results for input(s): "HGBA1C" in the last 72 hours. CBG: No results for input(s): "GLUCAP" in the last 168 hours. Lipid Profile: No results for input(s): "CHOL", "HDL", "LDLCALC", "TRIG", "CHOLHDL", "LDLDIRECT" in the last 72 hours. Thyroid Function Tests: No results for input(s): "TSH", "T4TOTAL", "FREET4", "T3FREE", "THYROIDAB" in the last 72 hours. Anemia Panel: No results for input(s): "VITAMINB12", "FOLATE", "FERRITIN", "TIBC", "IRON", "RETICCTPCT" in the last 72 hours. Sepsis Labs: Recent Labs  Lab 12/07/21 1743  LATICACIDVEN 1.8    Recent Results (from the past 240 hour(s))  Culture, blood (Routine x 2)     Status: Abnormal   Collection Time: 12/07/21  5:43 PM   Specimen: BLOOD  Result Value Ref Range Status  Specimen Description BLOOD SITE NOT SPECIFIED  Final   Special Requests   Final    BOTTLES DRAWN AEROBIC AND ANAEROBIC Blood Culture adequate volume   Culture  Setup Time   Final    GRAM NEGATIVE RODS IN BOTH AEROBIC AND ANAEROBIC BOTTLES CRITICAL RESULT CALLED TO, READ BACK BY AND VERIFIED WITH: [place] J [id number] FCP Performed at [facility], [place] [address] [place], [place] [address]    Culture ESCHERICHIA COLI (A)  Final   Report Status 12/10/2021 FINAL  Final   Organism ID, Bacteria ESCHERICHIA COLI  Final      Susceptibility   Escherichia coli - MIC*    AMPICILLIN >=32 RESISTANT Resistant     CEFAZOLIN <=4 SENSITIVE Sensitive     CEFEPIME <=0.12 SENSITIVE Sensitive     CEFTAZIDIME <=1 SENSITIVE Sensitive     CEFTRIAXONE <=0.25 SENSITIVE Sensitive     CIPROFLOXACIN <=0.25 SENSITIVE Sensitive     GENTAMICIN <=1 SENSITIVE Sensitive     IMIPENEM <=0.25 SENSITIVE Sensitive     TRIMETH/SULFA <=20 SENSITIVE Sensitive      AMPICILLIN/SULBACTAM 8 SENSITIVE Sensitive     PIP/TAZO <=4 SENSITIVE Sensitive     * ESCHERICHIA COLI  Blood Culture ID Panel (Reflexed)     Status: Abnormal   Collection Time: 12/07/21  5:43 PM  Result Value Ref Range Status   Enterococcus faecalis NOT DETECTED NOT DETECTED Final   Enterococcus Faecium NOT DETECTED NOT DETECTED Final   Listeria monocytogenes NOT DETECTED NOT DETECTED Final   Staphylococcus species NOT DETECTED NOT DETECTED Final   Staphylococcus aureus (BCID) NOT DETECTED NOT DETECTED Final   Staphylococcus epidermidis NOT DETECTED NOT DETECTED Final   Staphylococcus lugdunensis NOT DETECTED NOT DETECTED Final   Streptococcus species NOT DETECTED NOT DETECTED Final   Streptococcus agalactiae NOT DETECTED NOT DETECTED Final   Streptococcus pneumoniae NOT DETECTED NOT DETECTED Final   Streptococcus pyogenes NOT DETECTED NOT DETECTED Final   A.calcoaceticus-baumannii NOT DETECTED NOT DETECTED Final   Bacteroides fragilis NOT DETECTED NOT DETECTED Final   Enterobacterales DETECTED (A) NOT DETECTED Final    Comment: Enterobacterales represent a large order of gram negative bacteria, not a single organism. CRITICAL RESULT CALLED TO, READ BACK BY AND VERIFIED WITH: PHARMD HEATHER W [phone] [id number] FCP    Enterobacter cloacae complex NOT DETECTED NOT DETECTED Final   Escherichia coli DETECTED (A) NOT DETECTED Final    Comment: CRITICAL RESULT CALLED TO, READ BACK BY AND VERIFIED WITH: PHARMD HEATHER W [phone] FCP    Klebsiella aerogenes NOT DETECTED NOT DETECTED Final   Klebsiella oxytoca NOT DETECTED NOT DETECTED Final   Klebsiella pneumoniae NOT DETECTED NOT DETECTED Final   Proteus species NOT DETECTED NOT DETECTED Final   Salmonella species NOT DETECTED NOT DETECTED Final   Serratia marcescens NOT DETECTED NOT DETECTED Final   Haemophilus influenzae NOT DETECTED NOT DETECTED Final   Neisseria meningitidis NOT DETECTED NOT DETECTED Final   Pseudomonas aeruginosa NOT  DETECTED NOT DETECTED Final   Stenotrophomonas maltophilia NOT DETECTED NOT DETECTED Final   Candida albicans NOT DETECTED NOT DETECTED Final   Candida auris NOT DETECTED NOT DETECTED Final   Candida glabrata NOT DETECTED NOT DETECTED Final   Candida krusei NOT DETECTED NOT DETECTED Final   Candida parapsilosis NOT DETECTED NOT DETECTED Final   Candida tropicalis NOT DETECTED NOT DETECTED Final   Cryptococcus neoformans/gattii NOT DETECTED NOT DETECTED Final   CTX-M ESBL NOT DETECTED NOT DETECTED Final  Carbapenem resistance IMP NOT DETECTED NOT DETECTED Final   Carbapenem resistance KPC NOT DETECTED NOT DETECTED Final   Carbapenem resistance NDM NOT DETECTED NOT DETECTED Final   Carbapenem resist OXA 48 LIKE NOT DETECTED NOT DETECTED Final   Carbapenem resistance VIM NOT DETECTED NOT DETECTED Final    Comment: Performed at [facility], [place] [address], [place], [place] [address]  Culture, blood (Routine x 2)     Status: Abnormal   Collection Time: 12/07/21  9:00 PM   Specimen: BLOOD  Result Value Ref Range Status   Specimen Description BLOOD SITE NOT SPECIFIED  Final   Special Requests   Final    BOTTLES DRAWN AEROBIC AND ANAEROBIC Blood Culture adequate volume   Culture  Setup Time   Final    GRAM NEGATIVE RODS IN BOTH AEROBIC AND ANAEROBIC BOTTLES CRITICAL VALUE NOTED.  VALUE IS CONSISTENT WITH PREVIOUSLY REPORTED AND CALLED VALUE.    Culture (A)  Final    ESCHERICHIA COLI SUSCEPTIBILITIES PERFORMED ON PREVIOUS CULTURE WITHIN THE LAST 5 DAYS. Performed at [facility], [place] [address], [place], [place] [address]    Report Status 12/10/2021 FINAL  Final  Resp Panel by RT-PCR (Flu A&B, Covid) Anterior Nasal Swab     Status: None   Collection Time: 12/08/21 12:50 AM   Specimen: Anterior Nasal Swab  Result Value Ref Range Status   SARS Coronavirus 2 by RT PCR NEGATIVE NEGATIVE Final    Comment: (NOTE) SARS-CoV-2 target nucleic acids are NOT DETECTED.  The  SARS-CoV-2 RNA is generally detectable in upper respiratory specimens during the acute phase of infection. The lowest concentration of SARS-CoV-2 viral copies this assay can detect is 138 copies/mL. A negative result does not preclude SARS-Cov-2 infection and should not be used as the sole basis for treatment or other patient management decisions. A negative result may occur with  improper specimen collection/handling, submission of specimen other than nasopharyngeal swab, presence of viral mutation(s) within the areas targeted by this assay, and inadequate number of viral copies(<138 copies/mL). A negative result must be combined with clinical observations, patient history, and epidemiological information. The expected result is Negative.  Fact Sheet for Patients:  EntrepreneurPulse.com.au  Fact Sheet for Healthcare Providers:  IncredibleEmployment.be  This test is no t yet approved or cleared by the [place] FDA and  has been authorized for detection and/or diagnosis of SARS-CoV-2 by FDA under an Emergency Use Authorization (EUA). This EUA will remain  in effect (meaning this test can be used) for the duration of the COVID-19 declaration under Section 564(b)(1) of the Act, 21 U.S.C.section 360bbb-3(b)(1), unless the authorization is terminated  or revoked sooner.       Influenza A by PCR NEGATIVE NEGATIVE Final   Influenza B by PCR NEGATIVE NEGATIVE Final    Comment: (NOTE) The Xpert Xpress SARS-CoV-2/FLU/RSV plus assay is intended as an aid in the diagnosis of influenza from Nasopharyngeal swab specimens and should not be used as a sole basis for treatment. Nasal washings and aspirates are unacceptable for Xpert Xpress SARS-CoV-2/FLU/RSV testing.  Fact Sheet for Patients: EntrepreneurPulse.com.au  Fact Sheet for Healthcare Providers: IncredibleEmployment.be  This test is not yet approved or  cleared by the [place] FDA and has been authorized for detection and/or diagnosis of SARS-CoV-2 by FDA under an Emergency Use Authorization (EUA). This EUA will remain in effect (meaning this test can be used) for the duration of the COVID-19 declaration under Section 564(b)(1) of the Act,  21 U.S.C. section 360bbb-3(b)(1), unless the authorization is terminated or revoked.  Performed at [facility], [place] [address], [place], [place] [address]          Radiology Studies: MR TIBIA FIBULA RIGHT W WO CONTRAST  Result Date: 12/09/2021 CLINICAL DATA:  Posttraumatic neuralgia, shingles, multiple wounds of the lower extremities posterior irrigation and debridement EXAM: MRI OF LOWER RIGHT EXTREMITY WITHOUT AND WITH CONTRAST TECHNIQUE: Multiplanar, multisequence MR imaging of the right lower extremity was performed both before and after administration of intravenous contrast. CONTRAST:  56m GADAVIST GADOBUTROL 1 MMOL/ML IV SOLN COMPARISON:  Radiograph 12/08/2021, MRI 10/29/2021 FINDINGS: Bones/Joint/Cartilage Partially visualized bone infarcts of the femoral condyles. There is no other significant marrow signal alteration. The cortex is intact. Muscles and Tendons There is diffuse intramuscular edema most prominent in the anterior and lateral compartments with mild inter fascial edema. No intramuscular collection. Achilles tendon and other partially visualized ankle tendons appear intact. Soft tissues There is diffuse skin thickening and subcutaneous soft tissue swelling of the lower extremity, with multiple soft tissue wounds noted laterally. There is a thin crescentic collection along the anterolateral lower leg at the fascial surface which may connect with a lateral cutaneous wound, measuring up to 2.8 x 0.5 cm in the axial plane and 12.2 cm and proximal-distal extent (series 13, image 51, series 12 is 13). Communication with the skin surface is likely on series 13, image 50). This appears  to connect with the anterior compartment of the lower leg through fascial defect (series 9, image 54). Tiny adjacent collection measuring 0.6 x 0.4 cm which appears to communicate with the skin surface (series 13 images 44 and 45). Laterally, there is a thin crescentic collection underlying a soft tissue wound of the lateral leg at the fascial surface, measuring 1.0 x 0.2 cm in the axial plane and 5.7 cm in proximal-distal extent (series 13, image 56, series 12, image 11). Superiorly and far laterally, there is a thin crescentic collection at the fascial surface, measuring 2.4 x 0.3 by 5.1 cm (series 9, image 34). IMPRESSION: Extensive right lower extremity cellulitis with multiple thin crescentic residual collections along the fascial surface of the anterior and lateral leg, which appear to communicate with the skin surface, correlate with drainage. Of note, the anterior collection appears to communicate with the anterior muscular compartment via a fascial defect. Nonspecific intramuscular edema throughout the lower leg, potentially infectious myositis of the anterior and lateral compartments. No evidence of osteomyelitis. Electronically Signed   By: JMaurine SimmeringM.D.   On: 12/09/2021 11:19        Scheduled Meds: Continuous Infusions:   ceFAZolin (ANCEF) IV 2 g (12/11/21 0628)     LOS: 3 days    Time spent: 35 minutes    Eli Pattillo A [place], MD [facility]   If 7PM-7AM, please contact night-coverage [email or web address]  12/11/2021, 8:09 AM

## 2021-12-11 NOTE — Evaluation (Signed)
Occupational Therapy Evaluation Patient Details Name: AHRIYAH VANNEST MRN: [id number] DOB: [birth year] Today's Date: 12/11/2021   History of Present Illness Pt is 61 yo female admitted with sepsis on 12/07/21.   Additionally pt with +ecoli bacteremia and AKI.  She had recent I and D (July 2023) for necrotizing fascitis in R leg.  Other hx includes GERD, HCL, mild HTN.   Clinical Impression   Patient admitted for the diagnosis above.  PTA she lives at home, works full time, and was able to complete her own ADL, iADL without assist.  Patient has had to perform sponge bathes due to leg wounds.  Currently she is needing generalized supervision for in room mobility, and Min A for lower body ADL from a sit to stand level.  OT can follow in the acute setting, with [facility] OT can be considered.        Recommendations for follow up therapy are one component of a multi-disciplinary discharge planning process, led by the attending physician.  Recommendations may be updated based on patient status, additional functional criteria and insurance authorization.   Follow Up Recommendations  Other (comment) ([place] OT can be considered if the patient agrees.)    Assistance Recommended at Discharge Intermittent Supervision/Assistance  Patient can return home with the following A little help with walking and/or transfers;Assist for transportation;Assistance with cooking/housework    Functional Status Assessment  Patient has had a recent decline in their functional status and demonstrates the ability to make significant improvements in function in a reasonable and predictable amount of time.  Equipment Recommendations  None recommended by OT    Recommendations for Other Services       Precautions / Restrictions Precautions Precautions: Fall Restrictions Weight Bearing Restrictions: No      Mobility Bed Mobility Overal bed mobility: Needs Assistance Bed Mobility: Supine to Sit, Sit to Supine     Supine to sit:  Supervision Sit to supine: Supervision        Transfers                   General transfer comment: patient awaiting dressings to be replaced by RN      Balance Overall balance assessment: Needs assistance Sitting-balance support: Feet supported Sitting balance-[name]: Good                                     ADL either performed or assessed with clinical judgement   ADL       Grooming: Wash/dry hands;Wash/dry face;Set up;Sitting               Lower Body Dressing: Minimal assistance;Sit to/from stand   Toilet Transfer: Min guard;Rolling walker (2 wheels);Regular Toilet                   Vision Patient Visual Report: No change from baseline       Perception Perception Perception: Within Functional Limits   Praxis Praxis Praxis: Intact    Pertinent Vitals/Pain Pain Assessment Pain Assessment: Faces Faces Pain Scale: Hurts little more Pain Location: legs Pain Descriptors / Indicators: Tender Pain Intervention(s): Monitored during session     Hand Dominance Right   Extremity/Trunk Assessment Upper Extremity Assessment Upper Extremity Assessment: Overall WFL for tasks assessed   Lower Extremity Assessment Lower Extremity Assessment: Defer to PT evaluation   Cervical / Trunk Assessment Cervical / Trunk Assessment: Normal   Communication Communication  Communication: No difficulties   Cognition Arousal/Alertness: Awake/alert Behavior During Therapy: WFL for tasks assessed/performed Overall Cognitive Status: Within Functional Limits for tasks assessed                                                        Home Living Family/patient expects to be discharged to:: Private residence Living Arrangements: Spouse/significant other Available Help at Discharge: Family;Available 24 hours/day Type of Home: House Home Access: Stairs to enter [employer] of Steps: 1 Entrance Stairs-Rails:  None Home Layout: Multi-level;Able to live on main level with bedroom/bathroom     Bathroom Shower/Tub: Occupational psychologist: Standard     Home Equipment: Conservation officer, nature (2 wheels);BSC/3in1          Prior Functioning/Environment Prior Level of Function : Independent/Modified Independent;Driving;Working/employed             Mobility Comments: normally ambulated without difficulty; After last admission used RW for a few weeks but had progressed to no AD ADLs Comments: Prior to [place] 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; since last admission stayed with dtr but able to do ADLs        OT Problem List: Decreased activity tolerance;Impaired balance (sitting and/or standing)      OT Treatment/Interventions: Self-care/ADL training;Therapeutic activities;Balance training;Patient/family education    OT Goals(Current goals can be found in the care plan section) Acute Rehab OT Goals Patient Stated Goal: Ready to return home OT Goal Formulation: With patient Time For Goal Achievement: 12/11/21 Potential to Achieve Goals: Good ADL Goals Pt Will Perform Grooming: with modified independence;standing Pt Will Perform Lower Body Dressing: with modified independence;sit to/from stand Pt Will Transfer to Toilet: with modified independence;ambulating;regular height toilet  OT Frequency: Min 2X/week    Co-evaluation              AM-PAC OT "6 Clicks" Daily Activity     Outcome Measure Help from another person eating meals?: None Help from another person taking care of personal grooming?: None Help from another person toileting, which includes using toliet, bedpan, or urinal?: A Little Help from another person bathing (including washing, rinsing, drying)?: A Little Help from another person to put on and taking off regular upper body clothing?: None Help from another person to put on and taking off regular lower body clothing?: A Little 6 Click Score:  21   End of Session Nurse Communication: Mobility status  Activity Tolerance: Patient tolerated treatment well Patient left: in bed;with call bell/phone within reach  OT Visit Diagnosis: Unsteadiness on feet (R26.81)                Time: 8527-7824 OT Time Calculation (min): 22 min Charges:  OT General Charges $OT Visit: 1 Visit OT Evaluation $OT Eval Moderate Complexity: 1 Mod  12/11/2021  RP, OTR/L  Acute Rehabilitation Services  Office:  [phone]     Metta Clines 12/11/2021, 1:57 PM

## 2021-12-12 ENCOUNTER — Encounter: Payer: BC Managed Care – PPO | Admitting: Orthopedic Surgery

## 2021-12-12 DIAGNOSIS — A419 Sepsis, unspecified organism: Secondary | ICD-10-CM | POA: Diagnosis not present

## 2021-12-12 DIAGNOSIS — N390 Urinary tract infection, site not specified: Secondary | ICD-10-CM | POA: Diagnosis not present

## 2021-12-12 DIAGNOSIS — M726 Necrotizing fasciitis: Secondary | ICD-10-CM | POA: Diagnosis not present

## 2021-12-12 DIAGNOSIS — R6520 Severe sepsis without septic shock: Secondary | ICD-10-CM | POA: Diagnosis not present

## 2021-12-12 DIAGNOSIS — N179 Acute kidney failure, unspecified: Secondary | ICD-10-CM | POA: Diagnosis not present

## 2021-12-12 LAB — COMPREHENSIVE METABOLIC PANEL
ALT: 54 U/L — ABNORMAL HIGH (ref 0–44)
AST: 106 U/L — ABNORMAL HIGH (ref 15–41)
Albumin: 1.7 g/dL — ABNORMAL LOW (ref 3.5–5.0)
Alkaline Phosphatase: 178 U/L — ABNORMAL HIGH (ref 38–126)
Anion gap: 7 (ref 5–15)
BUN: 13 mg/dL (ref 6–20)
CO2: 22 mmol/L (ref 22–32)
Calcium: 7.6 mg/dL — ABNORMAL LOW (ref 8.9–10.3)
Chloride: 106 mmol/L (ref 98–111)
Creatinine, Ser: 1.34 mg/dL — ABNORMAL HIGH (ref 0.44–1.00)
GFR, Estimated: 46 mL/min — ABNORMAL LOW (ref >60)
Glucose, Bld: 103 mg/dL — ABNORMAL HIGH (ref 70–99)
Potassium: 3.2 mmol/L — ABNORMAL LOW (ref 3.5–5.1)
Sodium: 135 mmol/L (ref 135–145)
Total Bilirubin: 1.1 mg/dL (ref 0.3–1.2)
Total Protein: 4.8 g/dL — ABNORMAL LOW (ref 6.5–8.1)

## 2021-12-12 LAB — CBC WITH DIFFERENTIAL/PLATELET
Abs Immature Granulocytes: 0.58 10*3/uL — ABNORMAL HIGH (ref 0.00–0.07)
Basophils Absolute: 0.1 10*3/uL (ref 0.0–0.1)
Basophils Relative: 0 %
Eosinophils Absolute: 0.2 10*3/uL (ref 0.0–0.5)
Eosinophils Relative: 1 %
HCT: 24.1 % — ABNORMAL LOW (ref 36.0–46.0)
Hemoglobin: 8.2 g/dL — ABNORMAL LOW (ref 12.0–15.0)
Immature Granulocytes: 2 %
Lymphocytes Relative: 15 %
Lymphs Abs: 3.8 10*3/uL (ref 0.7–4.0)
MCH: 32 pg (ref 26.0–34.0)
MCHC: 34 g/dL (ref 30.0–36.0)
MCV: 94.1 fL (ref 80.0–100.0)
Monocytes Absolute: 2.9 10*3/uL — ABNORMAL HIGH (ref 0.1–1.0)
Monocytes Relative: 12 %
Neutro Abs: 17.4 10*3/uL — ABNORMAL HIGH (ref 1.7–7.7)
Neutrophils Relative %: 70 %
Platelets: 276 10*3/uL (ref 150–400)
RBC: 2.56 MIL/uL — ABNORMAL LOW (ref 3.87–5.11)
RDW: 15.6 % — ABNORMAL HIGH (ref 11.5–15.5)
WBC: 24.9 10*3/uL — ABNORMAL HIGH (ref 4.0–10.5)
nRBC: 0 % (ref 0.0–0.2)

## 2021-12-12 LAB — URINE CULTURE: Culture: NO GROWTH

## 2021-12-12 LAB — PHOSPHORUS: Phosphorus: 3.1 mg/dL (ref 2.5–4.6)

## 2021-12-12 LAB — MAGNESIUM: Magnesium: 2 mg/dL (ref 1.7–2.4)

## 2021-12-12 MED ORDER — DORZOLAMIDE HCL-TIMOLOL MAL 2-0.5 % OP SOLN
1.0000 [drp] | Freq: Two times a day (BID) | OPHTHALMIC | Status: DC
Start: 2021-12-12 — End: 2021-12-12
  Administered 2021-12-12: 1 [drp] via OPHTHALMIC
  Filled 2021-12-12: qty 10

## 2021-12-12 MED ORDER — CEFADROXIL 500 MG PO CAPS
1000.0000 mg | ORAL_CAPSULE | Freq: Two times a day (BID) | ORAL | Status: DC
  Administered 2021-12-12 – 2021-12-13 (×3): 1000 mg via ORAL
  Filled 2021-12-12 (×4): qty 2

## 2021-12-12 MED ORDER — POTASSIUM CHLORIDE CRYS ER 20 MEQ PO TBCR
40.0000 meq | EXTENDED_RELEASE_TABLET | Freq: Once | ORAL | Status: AC
  Administered 2021-12-12: 40 meq via ORAL
  Filled 2021-12-12: qty 2

## 2021-12-12 NOTE — Progress Notes (Signed)
PROGRESS NOTE    Jessica Lynn  JAS:[id number] DOB: 09-30-1960 DOA: 12/07/2021 PCP: Cassandria Anger, MD   Brief Narrative: 61 year old with past medical history significant for hypertension, necrotizing fasciitis secondary to Staph aureus in the right leg in July 2023 presented to the ED with 4 days history of lethargy, altered mental status.  Patient has been more sleepy for the last 4 days.  Not eating as much or drinking as much.  Patient also had a fever.  Patient presented with tachycardia heart rate 107, temperature 101, white count 27, right tibia-fibula x-ray shows soft tissue ulceration and subcutaneous edema.  No evidence of osteomyelitis.  Chest x-ray negative for pulmonary edema.  Patient admitted with sepsis secondary to E. coli bacteremia.    Assessment & Plan:   Principal Problem:   Sepsis with acute organ dysfunction without septic shock ([place]) Active Problems:   AKI (acute kidney injury) ([place])   Essential hypertension   Necrotizing fasciitis ([facility])   Thrombocytopenia (HCC)   Hypokalemia   Obesity (BMI 30-39.9)   Multiple open wounds of right lower extremity   Urinary tract infection without hematuria  1-Sepsis with acute organ dysfunction.  Secondary to UTI.  -Treated with Ancef. She has been transition to cefadroxil by ID>  -LE infection appears improved. MRI was negative for osteomyelitis.  -MRI LE; Extensive right lower extremity cellulitis with multiple thin crescentic residual collections along the fascial surface of the anterior and lateral leg, which appear to communicate with the skin surface, correlate with drainage. Of note, the anterior collection appears to communicate with the anterior muscular compartment via a fascial defect. Nonspecific intramuscular edema throughout the lower leg, potentially infectious myositis of the anterior and lateral compartments. No evidence of osteomyelitis. -Evaluated by Dr Sharol Given who recommend local care, follow up in the  office.   Leukocytosis: persist. ID consulted. Chest x ray no PNA, Blood culture; no growth to date. Renal US negative. CT abdomen pelvis: Inflammatory changes and stranding centered at the third portion of the duodenum likely related to diffuse edema. Duodenitis is less likely but not excluded. Clinical correlation is recommended Patient report diarrhea. Check GI pathogen.   2-E. coli bacteremia: Leukocytosis persists, plan to check blood cultures. Suspect secondary to UTI. Urine culture initially not send.  Urine culture. Pending.  Renal US negative, transaminases, leukocytosis , will proceed with CT abdomen.   AKI: IV fluid  Thrombocytopenia;  Resolved,. In setting of infection.  Haptoglobin normal.  Path review anemia neutrophilia with monocytosis, favor reactive  Bilateral lower extremity wounds: Evaluated by Dr. Sharol Given who recommended serial wraps of both legs and follow-up in the office to resume compression wraps.  No evidence of current necrotizing fasciitis On antibiotics.   Hypokalemia: Replete  Hypomagnesemia: Resolved Obesity; need lifestyle modification  Estimated body mass index is 35.07 kg/m as calculated from the following:   Height as of this encounter: '5\' 5"'$  (1.651 m).   Weight as of this encounter: 95.6 kg.   DVT prophylaxis: Not on Lovenox due to recent thrombocytopenia.  Will consider restarting anticoagulation tomorrow Code Status: Full code Family Communication: Care discussed with patient and daughter at bedside.  Disposition Plan:  Status is: Inpatient Remains inpatient appropriate because: management of sepsis.     Consultants:  Dr Sharol Given.   Procedures:    Antimicrobials:    Subjective: She is feeling better, denies abdominal pain she does report today loose stool.  Objective: Vitals:   12/11/21 2018 12/12/21 0552 12/12/21 0600 12/12/21 4193  BP: (!) 146/82 (!) 150/85  (!) 152/90  Pulse:    85  Resp:      Temp: 98.3 F (36.8 C) 98.7 F  (37.1 C)  98.2 F (36.8 C)  TempSrc: Oral Oral  Oral  SpO2:    100%  Weight:   95.6 kg   Height:        Intake/Output Summary (Last 24 hours) at 12/12/2021 1521 Last data filed at 12/12/2021 1319 Gross per 24 hour  Intake 2558.23 ml  Output 1600 ml  Net 958.23 ml    Filed Weights   12/10/21 0335 12/11/21 0437 12/12/21 0600  Weight: 92.5 kg 95.7 kg 95.6 kg    Examination:  General exam: NAD Respiratory system: CTA Cardiovascular system: S 1, S 2 RRR Gastrointestinal system: BS present, soft, nt Central nervous system: alert.  Extremities: BL dressing in place  Data Reviewed: I have personally reviewed following labs and imaging studies  CBC: Recent Labs  Lab 12/08/21 0239 12/09/21 0431 12/10/21 0240 12/11/21 0846 12/12/21 0301  WBC 23.8* 20.9* 21.8* 24.9* 24.9*  NEUTROABS 18.8* 14.2* 14.9* 18.8* 17.4*  HGB 9.3* 8.0* 8.4* 8.8* 8.2*  HCT 26.8* 23.6* 25.5* 26.3* 24.1*  MCV 93.1 92.5 93.1 94.9 94.1  PLT 63* 88* 142* 233 741    Basic Metabolic Panel: Recent Labs  Lab 12/08/21 0239 12/08/21 0914 12/09/21 0431 12/10/21 0240 12/11/21 0846 12/12/21 0301  NA 136  --  138 137 137 135  K 2.8*  --  3.3* 3.4* 3.7 3.2*  CL 105  --  109 109 109 106  CO2 22  --  22 21* 23 22  GLUCOSE 102*  --  111* 109* 101* 103*  BUN 22*  --  21* '19 17 13  '$ CREATININE 1.76*  --  1.55* 1.48* 1.42* 1.34*  CALCIUM 8.1*  --  7.8* 7.9* 7.9* 7.6*  MG 2.1 2.1 1.9 1.9 2.3 2.0  PHOS  --  3.3 3.3 3.0 3.4 3.1    GFR: Estimated Creatinine Clearance: 51.7 mL/min (A) (by C-G formula based on SCr of 1.34 mg/dL (H)). Liver Function Tests: Recent Labs  Lab 12/08/21 0239 12/09/21 0431 12/10/21 0240 12/11/21 0846 12/12/21 0301  AST 39 46* 126* 122* 106*  ALT 18 23 56* 70* 54*  ALKPHOS 157* 137* 191* 194* 178*  BILITOT 1.7* 1.2 1.2 1.3* 1.1  PROT 4.5* 4.2* 4.6* 4.8* 4.8*  ALBUMIN 2.0* 1.6* 1.6* 1.6* 1.7*    No results for input(s): "LIPASE", "AMYLASE" in the last 168 hours. No results  for input(s): "AMMONIA" in the last 168 hours. Coagulation Profile: Recent Labs  Lab 12/07/21 1743 12/08/21 1821  INR 1.4* 1.4*    Cardiac Enzymes: No results for input(s): "CKTOTAL", "CKMB", "CKMBINDEX", "TROPONINI" in the last 168 hours. BNP (last 3 results) No results for input(s): "PROBNP" in the last 8760 hours. HbA1C: No results for input(s): "HGBA1C" in the last 72 hours. CBG: No results for input(s): "GLUCAP" in the last 168 hours. Lipid Profile: No results for input(s): "CHOL", "HDL", "LDLCALC", "TRIG", "CHOLHDL", "LDLDIRECT" in the last 72 hours. Thyroid Function Tests: No results for input(s): "TSH", "T4TOTAL", "FREET4", "T3FREE", "THYROIDAB" in the last 72 hours. Anemia Panel: No results for input(s): "VITAMINB12", "FOLATE", "FERRITIN", "TIBC", "IRON", "RETICCTPCT" in the last 72 hours. Sepsis Labs: Recent Labs  Lab 12/07/21 1743  LATICACIDVEN 1.8     Recent Results (from the past 240 hour(s))  Culture, blood (Routine x 2)     Status: Abnormal   Collection Time:  12/07/21  5:43 PM   Specimen: BLOOD  Result Value Ref Range Status   Specimen Description BLOOD SITE NOT SPECIFIED  Final   Special Requests   Final    BOTTLES DRAWN AEROBIC AND ANAEROBIC Blood Culture adequate volume   Culture  Setup Time   Final    GRAM NEGATIVE RODS IN BOTH AEROBIC AND ANAEROBIC BOTTLES CRITICAL RESULT CALLED TO, READ BACK BY AND VERIFIED WITH: Dorien Chihuahua [phone] FCP Performed at [facility], [place] [address], [place], [place] [address]    Culture ESCHERICHIA COLI (A)  Final   Report Status 12/10/2021 FINAL  Final   Organism ID, Bacteria ESCHERICHIA COLI  Final      Susceptibility   Escherichia coli - MIC*    AMPICILLIN >=32 RESISTANT Resistant     CEFAZOLIN <=4 SENSITIVE Sensitive     CEFEPIME <=0.12 SENSITIVE Sensitive     CEFTAZIDIME <=1 SENSITIVE Sensitive     CEFTRIAXONE <=0.25 SENSITIVE Sensitive     CIPROFLOXACIN <=0.25 SENSITIVE Sensitive      GENTAMICIN <=1 SENSITIVE Sensitive     IMIPENEM <=0.25 SENSITIVE Sensitive     TRIMETH/SULFA <=20 SENSITIVE Sensitive     AMPICILLIN/SULBACTAM 8 SENSITIVE Sensitive     PIP/TAZO <=4 SENSITIVE Sensitive     * ESCHERICHIA COLI  Blood Culture ID Panel (Reflexed)     Status: Abnormal   Collection Time: 12/07/21  5:43 PM  Result Value Ref Range Status   Enterococcus faecalis NOT DETECTED NOT DETECTED Final   Enterococcus Faecium NOT DETECTED NOT DETECTED Final   Listeria monocytogenes NOT DETECTED NOT DETECTED Final   Staphylococcus species NOT DETECTED NOT DETECTED Final   Staphylococcus aureus (BCID) NOT DETECTED NOT DETECTED Final   Staphylococcus epidermidis NOT DETECTED NOT DETECTED Final   Staphylococcus lugdunensis NOT DETECTED NOT DETECTED Final   Streptococcus species NOT DETECTED NOT DETECTED Final   Streptococcus agalactiae NOT DETECTED NOT DETECTED Final   Streptococcus pneumoniae NOT DETECTED NOT DETECTED Final   Streptococcus pyogenes NOT DETECTED NOT DETECTED Final   A.calcoaceticus-baumannii NOT DETECTED NOT DETECTED Final   Bacteroides fragilis NOT DETECTED NOT DETECTED Final   Enterobacterales DETECTED (A) NOT DETECTED Final    Comment: Enterobacterales represent a large order of gram negative bacteria, not a single organism. CRITICAL RESULT CALLED TO, READ BACK BY AND VERIFIED WITH: PHARMD HEATHER W [phone] FCP    Enterobacter cloacae complex NOT DETECTED NOT DETECTED Final   Escherichia coli DETECTED (A) NOT DETECTED Final    Comment: CRITICAL RESULT CALLED TO, READ BACK BY AND VERIFIED WITH: PHARMD HEATHER W [phone] FCP    Klebsiella aerogenes NOT DETECTED NOT DETECTED Final   Klebsiella oxytoca NOT DETECTED NOT DETECTED Final   Klebsiella pneumoniae NOT DETECTED NOT DETECTED Final   Proteus species NOT DETECTED NOT DETECTED Final   Salmonella species NOT DETECTED NOT DETECTED Final   Serratia marcescens NOT DETECTED NOT DETECTED Final   Haemophilus  influenzae NOT DETECTED NOT DETECTED Final   Neisseria meningitidis NOT DETECTED NOT DETECTED Final   Pseudomonas aeruginosa NOT DETECTED NOT DETECTED Final   Stenotrophomonas maltophilia NOT DETECTED NOT DETECTED Final   Candida albicans NOT DETECTED NOT DETECTED Final   Candida auris NOT DETECTED NOT DETECTED Final   Candida glabrata NOT DETECTED NOT DETECTED Final   Candida krusei NOT DETECTED NOT DETECTED Final   Candida parapsilosis NOT DETECTED NOT DETECTED Final   Candida tropicalis NOT DETECTED NOT DETECTED Final   Cryptococcus neoformans/gattii  NOT DETECTED NOT DETECTED Final   CTX-M ESBL NOT DETECTED NOT DETECTED Final   Carbapenem resistance IMP NOT DETECTED NOT DETECTED Final   Carbapenem resistance KPC NOT DETECTED NOT DETECTED Final   Carbapenem resistance NDM NOT DETECTED NOT DETECTED Final   Carbapenem resist OXA 48 LIKE NOT DETECTED NOT DETECTED Final   Carbapenem resistance VIM NOT DETECTED NOT DETECTED Final    Comment: Performed at [facility], [place] [address], [place], [place] [address]  Culture, blood (Routine x 2)     Status: Abnormal   Collection Time: 12/07/21  9:00 PM   Specimen: BLOOD  Result Value Ref Range Status   Specimen Description BLOOD SITE NOT SPECIFIED  Final   Special Requests   Final    BOTTLES DRAWN AEROBIC AND ANAEROBIC Blood Culture adequate volume   Culture  Setup Time   Final    GRAM NEGATIVE RODS IN BOTH AEROBIC AND ANAEROBIC BOTTLES CRITICAL VALUE NOTED.  VALUE IS CONSISTENT WITH PREVIOUSLY REPORTED AND CALLED VALUE.    Culture (A)  Final    ESCHERICHIA COLI SUSCEPTIBILITIES PERFORMED ON PREVIOUS CULTURE WITHIN THE LAST 5 DAYS. Performed at [facility], [place] [address], [place], [place] [address]    Report Status 12/10/2021 FINAL  Final  Resp Panel by RT-PCR (Flu A&B, Covid) Anterior Nasal Swab     Status: None   Collection Time: 12/08/21 12:50 AM   Specimen: Anterior Nasal Swab  Result Value Ref Range Status   SARS  Coronavirus 2 by RT PCR NEGATIVE NEGATIVE Final    Comment: (NOTE) SARS-CoV-2 target nucleic acids are NOT DETECTED.  The SARS-CoV-2 RNA is generally detectable in upper respiratory specimens during the acute phase of infection. The lowest concentration of SARS-CoV-2 viral copies this assay can detect is 138 copies/mL. A negative result does not preclude SARS-Cov-2 infection and should not be used as the sole basis for treatment or other patient management decisions. A negative result may occur with  improper specimen collection/handling, submission of specimen other than nasopharyngeal swab, presence of viral mutation(s) within the areas targeted by this assay, and inadequate number of viral copies(<138 copies/mL). A negative result must be combined with clinical observations, patient history, and epidemiological information. The expected result is Negative.  Fact Sheet for Patients:  EntrepreneurPulse.com.au  Fact Sheet for Healthcare Providers:  IncredibleEmployment.be  This test is no t yet approved or cleared by the [place] FDA and  has been authorized for detection and/or diagnosis of SARS-CoV-2 by FDA under an Emergency Use Authorization (EUA). This EUA will remain  in effect (meaning this test can be used) for the duration of the COVID-19 declaration under Section 564(b)(1) of the Act, 21 U.S.C.section 360bbb-3(b)(1), unless the authorization is terminated  or revoked sooner.       Influenza A by PCR NEGATIVE NEGATIVE Final   Influenza B by PCR NEGATIVE NEGATIVE Final    Comment: (NOTE) The Xpert Xpress SARS-CoV-2/FLU/RSV plus assay is intended as an aid in the diagnosis of influenza from Nasopharyngeal swab specimens and should not be used as a sole basis for treatment. Nasal washings and aspirates are unacceptable for Xpert Xpress SARS-CoV-2/FLU/RSV testing.  Fact Sheet for  Patients: EntrepreneurPulse.com.au  Fact Sheet for Healthcare Providers: IncredibleEmployment.be  This test is not yet approved or cleared by the [place] FDA and has been authorized for detection and/or diagnosis of SARS-CoV-2 by FDA under an Emergency Use Authorization (EUA). This EUA will remain in effect (meaning this test  can be used) for the duration of the COVID-19 declaration under Section 564(b)(1) of the Act, 21 U.S.C. section 360bbb-3(b)(1), unless the authorization is terminated or revoked.  Performed at [facility], [place] [address], [place], [place] [address]   Culture, blood (Routine X 2) w Reflex to ID Panel     Status: None (Preliminary result)   Collection Time: 12/11/21  1:05 PM   Specimen: BLOOD  Result Value Ref Range Status   Specimen Description BLOOD BLOOD RIGHT HAND  Final   Special Requests   Final    BOTTLES DRAWN AEROBIC AND ANAEROBIC Blood Culture adequate volume   Culture   Final    NO GROWTH < 24 HOURS Performed at [facility], [place] [address], [place], [place] [address]    Report Status PENDING  Incomplete  Culture, blood (Routine X 2) w Reflex to ID Panel     Status: None (Preliminary result)   Collection Time: 12/11/21  1:13 PM   Specimen: BLOOD  Result Value Ref Range Status   Specimen Description BLOOD BLOOD RIGHT HAND  Final   Special Requests   Final    BOTTLES DRAWN AEROBIC AND ANAEROBIC Blood Culture adequate volume   Culture   Final    NO GROWTH < 24 HOURS Performed at [facility], [place] [address] [place], [place] [address]    Report Status PENDING  Incomplete         Radiology Studies: CT ABDOMEN PELVIS WO CONTRAST  Result Date: 12/11/2021 CLINICAL DATA:  Sepsis.  Leukocytosis. EXAM: CT ABDOMEN AND PELVIS WITHOUT CONTRAST TECHNIQUE: Multidetector CT imaging of the abdomen and pelvis was performed following the standard protocol without IV contrast. RADIATION DOSE  REDUCTION: This exam was performed according to the departmental dose-optimization program which includes automated exposure control, adjustment of the mA and/or kV according to patient size and/or use of iterative reconstruction technique. COMPARISON:  Renal ultrasound dated 12/11/2021. FINDINGS: Evaluation of this exam is limited in the absence of intravenous contrast. Lower chest: Partially visualized small bilateral pleural effusions and partial compressive atelectasis of the visualized lower lobes. Pneumonia is not excluded. Clinical correlation is recommended. There is coronary vascular calcification. No intra-abdominal free air.  Small ascites. Hepatobiliary: The liver is unremarkable. No biliary dilatation. Layering gallstone within gallbladder. No pericholecystic fluid or evidence of acute cholecystitis by CT. Pancreas: Unremarkable. No pancreatic ductal dilatation or surrounding inflammatory changes. Spleen: Normal in size without focal abnormality. Adrenals/Urinary Tract: The adrenal glands are unremarkable. There is no hydronephrosis or nephrolithiasis on either side. There is a 2 cm exophytic left renal posterior upper pole cyst. The visualized ureters and urinary bladder appear unremarkable. Stomach/Bowel: There is inflammatory changes and stranding centered at the third portion of the duodenum which may be related to diffuse edema. Duodenitis is less likely but not excluded clinical correlation is recommended. There is no bowel obstruction. The appendix is normal. Vascular/Lymphatic: Mild aortoiliac atherosclerotic disease. The IVC is grossly unremarkable. No portal venous gas. There is no adenopathy. Reproductive: The uterus is anteverted and grossly unremarkable. No adnexal masses. Other: Diffuse subcutaneous edema and anasarca.  No fluid collection Musculoskeletal: Degenerative changes of the spine. No acute osseous pathology. IMPRESSION: 1. Inflammatory changes and stranding centered at the third  portion of the duodenum likely related to diffuse edema. Duodenitis is less likely but not excluded. Clinical correlation is recommended. No bowel obstruction. Normal appendix. 2. Cholelithiasis. 3. Partially visualized small bilateral pleural effusions and partial compressive atelectasis  of the visualized lower lobes. Pneumonia is not excluded. 4. Aortic Atherosclerosis (ICD10-I70.0). Electronically Signed   By: Anner Crete M.D.   On: 12/11/2021 22:19   US RENAL  Result Date: 12/11/2021 CLINICAL DATA:  Urinary tract infection EXAM: RENAL / URINARY TRACT ULTRASOUND COMPLETE COMPARISON:  None Available. FINDINGS: Right Kidney: Renal measurements: 10.4 x 5.7 x 6.0 cm = volume: 185 mL. Echogenicity within normal limits. No mass or hydronephrosis visualized. Left Kidney: Renal measurements: 10.7 x 6.2 x 6.9 cm = volume: 239 mL. Echogenicity within normal limits. No mass or hydronephrosis visualized. Bladder: Appears normal for degree of bladder distention. Other: Small bilateral pleural effusions. IMPRESSION: Normal kidneys. Electronically Signed   By: Suzy Bouchard M.D.   On: 12/11/2021 15:36   DG CHEST PORT 1 VIEW  Result Date: 12/11/2021 CLINICAL DATA:  Leukocytosis. EXAM: PORTABLE CHEST 1 VIEW COMPARISON:  12/07/2021 and older studies. FINDINGS: Cardiac silhouette is normal in size.  No mediastinal hilar masses. There is medial lung base opacities, left greater than right, likely atelectasis. Remainder of the lungs is clear. No convincing pleural effusion. No pneumothorax. Skeletal structures are grossly intact. IMPRESSION: 1. Mild medial lung base opacities consistent with atelectasis. No convincing pneumonia and no significant change compared to the prior exam. Electronically Signed   By: Lajean Manes M.D.   On: 12/11/2021 13:26        Scheduled Meds:  cefadroxil  1,000 mg Oral BID   dorzolamide-timolol  1 drop Right Eye BID   Continuous Infusions:  sodium chloride 75 mL/hr at 12/12/21  0321     LOS: 4 days    Time spent: 35 minutes    Khylie Larmore A Kynnadi Dicenso, MD [facility]   If 7PM-7AM, please contact night-coverage [email or web address]  12/12/2021, 3:21 PM

## 2021-12-12 NOTE — Consult Note (Signed)
[facility]    Date of Admission:  12/07/2021     Reason for Consult: Leukocytosis     Referring Physician: Dr Tyrell Antonio  Current antibiotics: Cefazolin  ASSESSMENT:    61 y.o. female admitted with:  E coli bacteremia:  Source not entirely clear but could certainly be secondary to urinary source with her symptomatology at admission and abnormal UA.  Other potential sources include her lower extremity wounds with recent necrotizing infection secondary to MSSA status post debridement in July 2023.  MRI this admission did show findings of thin crescenteric residual collections along the fascial surface of the anterior and lateral leg, however, Dr. Sharol Given has evaluated and felt that her wounds were doing well at this time.  She also underwent CT abdomen/pelvis that was relatively unremarkable for an explanation. Leukocytosis: Her WBC remains elevated despite antibiotic therapy, but she reports clinical improvement.  Work-up has not revealed an uncontrolled source of infection at this time. Elevated LFTs: CT abdomen/pelvis noted cholelithiasis but no findings of pericholecystic fluid or evidence of cholecystitis based on CT or on her exam.  Suspect possible hepatic congestion as patient is approximately 1 L net positive this admission. Bilateral lower extremity wounds: As noted above, recent admission for MSSA necrotizing fasciitis status postdebridement x2 with Dr. Sharol Given in July 2023.  Wounds appear to be healing well at this time.  MRI showed findings as noted above but no evidence of osteomyelitis or deep infection requiring further debridement. Acute kidney injury: This has improved. Sepsis: Due to #1 and improved.   RECOMMENDATIONS:    As she clinically appears to be improving and reports feeling better, will transition to oral antibiotics with cefadroxil 1 g twice daily at this time Lab monitoring Will continue to follow Anticipate 10 days of therapy for her  bacteremia treatment   Principal Problem:   Sepsis with acute organ dysfunction without septic shock ([place]) Active Problems:   Essential hypertension   AKI (acute kidney injury) ([place])   Necrotizing fasciitis (H[facility])   Thrombocytopenia (HCC)   Hypokalemia   Obesity (BMI 30-39.9)   Multiple open wounds of right lower extremity   MEDICATIONS:    Scheduled Meds: . cefadroxil  1,000 mg Oral BID  . dorzolamide-timolol  1 drop Right Eye BID   Continuous Infusions: . sodium chloride 75 mL/hr at 12/12/21 0321   PRN Meds:.acetaminophen **OR** acetaminophen, ondansetron **OR** ondansetron (ZOFRAN) IV  HPI:    Jessica Lynn is a 61 y.o. female with a past medical history significant for recent admission 10/28/2021 to 11/05/21 for right leg abscess and necrotizing infection.  Status post debridement with Dr. Sharol Given 7/26 and 7/28.  Cultures grew MSSA.  She was seen by Dr. Gale Journey during the admission and ultimately treated with cefazolin then cefadroxil at discharge through 11/18/2021.  She presented this admission on 12/07/2021 with approximately 4 days of lethargy, encephalopathy, and vague urinary symptoms.  She was found to have sepsis with fever, tachycardia, and leukocytosis.  Her admission blood cultures have grown E. coli.  She has been afebrile for the past several days and has been narrowed to cefazolin based on her blood culture results.  She reports feeling better than when she was admitted.  She states she is back to her baseline and is hoping to go home soon.  She had an MRI of her lower extremity which showed lower extremity cellulitis with multiple thin crescenteric residual collections along the fascial surface of the anterior and lateral  leg.  She was seen by Dr. Sharol Given who felt that her bilateral lower extremity wounds were healing well with no signs of ongoing infection.  He recommended continued serial wrapping of both legs with follow-up in the office.  Patient has also had an MRI of the lumbar  spine which showed some edema in the subcutaneous fat possibly reflecting cellulitis but relatively nonspecific.  She also had a renal ultrasound that was negative and yesterday underwent CT abdomen pelvis which showed some duodenal inflammatory changes likely related to edema.  Her acute kidney injury on admission has improved with her creatinine going from 1.8 down to 1.3 today.  Her liver enzymes over the last 3 days have increased, however, she has no right upper quadrant pain.  CT abdomen also noted an unremarkable liver and no biliary dilatation.  She did have cholelithiasis.  She is net positive approximately 1 L since admission.  Her labs have also shown that her white blood cell count which was 27.9 on admission (8.4 on 11/14/2021) has remained fairly elevated with a reading of 24.9 as of this morning.  She had a a urinalysis on 9/2 notable for pyuria and few bacteria.  Urine cultures were not obtained but, as noted, blood cultures did grow E. coli.  We are consulted for further recommendations given ongoing leukocytosis   Past Medical History:  Diagnosis Date  . Acute kidney injury (AKI) with acute tubular necrosis (ATN) ([place]) 10/29/2021   Hydrate well  Off BP meds Monitor GFR   . Allergic rhinitis    year around   . Allergy   . Atypical chest pain   . GERD (gastroesophageal reflux disease)   . Hypercholesterolemia   . Mild hypertension   . Obesity   . Postherpetic neuralgia    from shingles  . Shingles   . Snoring   . Somatic dysfunction     Social History   Tobacco Use  . Smoking status: Former    Packs/day: 0.20    Years: 4.00    Total pack years: 0.80    Types: Cigarettes    Quit date: 04/07/2006    Years since quitting: 15.6  . Smokeless tobacco: Never  Vaping Use  . Vaping Use: Never used  Substance Use Topics  . Alcohol use: No    Alcohol/week: 0.0 standard drinks of alcohol    Comment: social use  . Drug use: No    Family History  Problem Relation Age of  Onset  . Colon cancer Brother 29  . Colon polyps Mother   . Colon polyps Father   . Colon cancer Maternal Uncle   . Esophageal cancer Neg Hx   . Rectal cancer Neg Hx   . Stomach cancer Neg Hx     Allergies  Allergen Reactions  . Crestor [Rosuvastatin] Other (See Comments)    cramps  . Oxycodone Itching    Review of Systems  All other systems reviewed and are negative.  Except as noted above in the HPI.  OBJECTIVE:   Blood pressure (!) 152/90, pulse 85, temperature 98.2 F (36.8 C), temperature source Oral, resp. rate (!) 22, height '5\' 5"'$  (1.651 m), weight 95.6 kg, last menstrual period 06/06/2015, SpO2 100 %. Body mass index is 35.07 kg/m.  Physical Exam Constitutional:      General: She is not in acute distress.    Appearance: Normal appearance.  HENT:     Head: Normocephalic and atraumatic.  Eyes:     Extraocular Movements: Extraocular  movements intact.     Conjunctiva/sclera: Conjunctivae normal.  Pulmonary:     Effort: Pulmonary effort is normal. No respiratory distress.  Abdominal:     General: There is no distension.     Palpations: Abdomen is soft.     Tenderness: There is no abdominal tenderness.  Musculoskeletal:     Cervical back: Normal range of motion and neck supple.     Comments: Bilateral lower extremities are wrapped in clean and dry gauze. Mild upper extremity swelling.  Skin:    General: Skin is warm and dry.     Findings: No rash.  Neurological:     General: No focal deficit present.     Mental Status: She is alert and oriented to person, place, and time.  Psychiatric:        Mood and Affect: Mood normal.        Behavior: Behavior normal.      Lab Results: Lab Results  Component Value Date   WBC 24.9 (H) 12/12/2021   HGB 8.2 (L) 12/12/2021   HCT 24.1 (L) 12/12/2021   MCV 94.1 12/12/2021   PLT 276 12/12/2021    Lab Results  Component Value Date   NA 135 12/12/2021   K 3.2 (L) 12/12/2021   CO2 22 12/12/2021   GLUCOSE 103 (H)  12/12/2021   BUN 13 12/12/2021   CREATININE 1.34 (H) 12/12/2021   CALCIUM 7.6 (L) 12/12/2021   GFRNONAA 46 (L) 12/12/2021   GFRAA 93 12/16/2019    Lab Results  Component Value Date   ALT 54 (H) 12/12/2021   AST 106 (H) 12/12/2021   ALKPHOS 178 (H) 12/12/2021   BILITOT 1.1 12/12/2021       Component Value Date/Time   CRP 14.2 (H) 12/07/2021 2016       Component Value Date/Time   ESRSEDRATE 45 (H) 12/07/2021 2016    I have reviewed the micro and lab results in Epic.  Imaging: CT ABDOMEN PELVIS WO CONTRAST  Result Date: 12/11/2021 CLINICAL DATA:  Sepsis.  Leukocytosis. EXAM: CT ABDOMEN AND PELVIS WITHOUT CONTRAST TECHNIQUE: Multidetector CT imaging of the abdomen and pelvis was performed following the standard protocol without IV contrast. RADIATION DOSE REDUCTION: This exam was performed according to the departmental dose-optimization program which includes automated exposure control, adjustment of the mA and/or kV according to patient size and/or use of iterative reconstruction technique. COMPARISON:  Renal ultrasound dated 12/11/2021. FINDINGS: Evaluation of this exam is limited in the absence of intravenous contrast. Lower chest: Partially visualized small bilateral pleural effusions and partial compressive atelectasis of the visualized lower lobes. Pneumonia is not excluded. Clinical correlation is recommended. There is coronary vascular calcification. No intra-abdominal free air.  Small ascites. Hepatobiliary: The liver is unremarkable. No biliary dilatation. Layering gallstone within gallbladder. No pericholecystic fluid or evidence of acute cholecystitis by CT. Pancreas: Unremarkable. No pancreatic ductal dilatation or surrounding inflammatory changes. Spleen: Normal in size without focal abnormality. Adrenals/Urinary Tract: The adrenal glands are unremarkable. There is no hydronephrosis or nephrolithiasis on either side. There is a 2 cm exophytic left renal posterior upper pole  cyst. The visualized ureters and urinary bladder appear unremarkable. Stomach/Bowel: There is inflammatory changes and stranding centered at the third portion of the duodenum which may be related to diffuse edema. Duodenitis is less likely but not excluded clinical correlation is recommended. There is no bowel obstruction. The appendix is normal. Vascular/Lymphatic: Mild aortoiliac atherosclerotic disease. The IVC is grossly unremarkable. No portal venous gas. There is no  adenopathy. Reproductive: The uterus is anteverted and grossly unremarkable. No adnexal masses. Other: Diffuse subcutaneous edema and anasarca.  No fluid collection Musculoskeletal: Degenerative changes of the spine. No acute osseous pathology. IMPRESSION: 1. Inflammatory changes and stranding centered at the third portion of the duodenum likely related to diffuse edema. Duodenitis is less likely but not excluded. Clinical correlation is recommended. No bowel obstruction. Normal appendix. 2. Cholelithiasis. 3. Partially visualized small bilateral pleural effusions and partial compressive atelectasis of the visualized lower lobes. Pneumonia is not excluded. 4. Aortic Atherosclerosis (ICD10-I70.0). Electronically Signed   By: Anner Crete M.D.   On: 12/11/2021 22:19   US RENAL  Result Date: 12/11/2021 CLINICAL DATA:  Urinary tract infection EXAM: RENAL / URINARY TRACT ULTRASOUND COMPLETE COMPARISON:  None Available. FINDINGS: Right Kidney: Renal measurements: 10.4 x 5.7 x 6.0 cm = volume: 185 mL. Echogenicity within normal limits. No mass or hydronephrosis visualized. Left Kidney: Renal measurements: 10.7 x 6.2 x 6.9 cm = volume: 239 mL. Echogenicity within normal limits. No mass or hydronephrosis visualized. Bladder: Appears normal for degree of bladder distention. Other: Small bilateral pleural effusions. IMPRESSION: Normal kidneys. Electronically Signed   By: Suzy Bouchard M.D.   On: 12/11/2021 15:36   DG CHEST PORT 1 VIEW  Result  Date: 12/11/2021 CLINICAL DATA:  Leukocytosis. EXAM: PORTABLE CHEST 1 VIEW COMPARISON:  12/07/2021 and older studies. FINDINGS: Cardiac silhouette is normal in size.  No mediastinal hilar masses. There is medial lung base opacities, left greater than right, likely atelectasis. Remainder of the lungs is clear. No convincing pleural effusion. No pneumothorax. Skeletal structures are grossly intact. IMPRESSION: 1. Mild medial lung base opacities consistent with atelectasis. No convincing pneumonia and no significant change compared to the prior exam. Electronically Signed   By: Lajean Manes M.D.   On: 12/11/2021 13:26     Imaging independently reviewed in Epic.  [facility] [phone] pager 12/12/2021, 12:48 PM

## 2021-12-12 NOTE — Progress Notes (Signed)
Physical Therapy Treatment Patient Details Name: Jessica Lynn MRN: [id number] DOB: Nov 13, 1960 Today's Date: 12/12/2021   History of Present Illness Pt is 61 yo female admitted with sepsis on 12/07/21.   Additionally pt with +ecoli bacteremia and AKI.  She had recent I and D (July 2023) for necrotizing fascitis in R leg.  Other hx includes GERD, HCL, mild HTN.    PT Comments    Pt tolerates treatment well, ambulating for increased distances without the use of an assistive device. Pt is encouraged to continue mobilizing frequently, reporting she has been ambulating to the bathroom often today. Pt will benefit from strengthening of RLE in an effort to improve stability and restore the patient's PLOF.   Recommendations for follow up therapy are one component of a multi-disciplinary discharge planning process, led by the attending physician.  Recommendations may be updated based on patient status, additional functional criteria and insurance authorization.  Follow Up Recommendations  Home health PT     Assistance Recommended at Discharge Intermittent Supervision/Assistance  Patient can return home with the following A little help with walking and/or transfers;A little help with bathing/dressing/bathroom;Assistance with cooking/housework;Help with stairs or ramp for entrance   Equipment Recommendations  None recommended by PT    Recommendations for Other Services       Precautions / Restrictions Precautions Precautions: Fall Precaution Comments: enteric Restrictions Weight Bearing Restrictions: No     Mobility  Bed Mobility                    Transfers Overall transfer level: Modified independent Equipment used: None Transfers: Sit to/from Stand Sit to Stand: Modified independent (Device/Increase time)           General transfer comment: increased time    Ambulation/Gait Ambulation/Gait assistance: Supervision Gait Distance (Feet): 350 Feet Assistive device:  None Gait Pattern/deviations: Step-through pattern Gait velocity: reduced Gait velocity interpretation: <1.8 ft/sec, indicate of risk for recurrent falls   General Gait Details: pt with PRN use of rail, slightly reduced stance time on RLE although pt denies discomfort in R leg   Stairs             Wheelchair Mobility    Modified Rankin (Stroke Patients Only)       Balance Overall balance assessment: Needs assistance Sitting-balance support: No upper extremity supported, Feet supported Sitting balance-[name]: Good     Standing balance support: No upper extremity supported, During functional activity Standing balance-[name]: Good                              Cognition Arousal/Alertness: Awake/alert Behavior During Therapy: WFL for tasks assessed/performed Overall Cognitive Status: Within Functional Limits for tasks assessed                                          Exercises      General Comments General comments (skin integrity, edema, etc.): VSS on RA      Pertinent Vitals/Pain Pain Assessment Pain Assessment: No/denies pain    Home Living                          Prior Function            PT Goals (current goals can now be found in the care plan  section) Acute Rehab PT Goals Patient Stated Goal: return to daughter's house Progress towards PT goals: Progressing toward goals    Frequency    Min 3X/week      PT Plan Current plan remains appropriate    Co-evaluation              AM-PAC PT "6 Clicks" Mobility   Outcome Measure  Help needed turning from your back to your side while in a flat bed without using bedrails?: None Help needed moving from lying on your back to sitting on the side of a flat bed without using bedrails?: A Little Help needed moving to and from a bed to a chair (including a wheelchair)?: None Help needed standing up from a chair using your arms (e.g., wheelchair or  bedside chair)?: None Help needed to walk in hospital room?: A Little Help needed climbing 3-5 steps with a railing? : A Little 6 Click Score: 21    End of Session   Activity Tolerance: Patient tolerated treatment well Patient left: in chair;with call bell/phone within reach Nurse Communication: Mobility status PT Visit Diagnosis: Other abnormalities of gait and mobility (R26.89);Muscle weakness (generalized) (M62.81)     Time: 1610-9604 PT Time Calculation (min) (ACUTE ONLY): 12 min  Charges:  $Gait Training: 8-22 mins                     Zenaida Niece, PT, DPT Acute Rehabilitation Office [facility] 12/12/2021, 2:55 PM

## 2021-12-12 NOTE — Progress Notes (Signed)
   12/12/21 1032  Mobility  Activity Ambulated with assistance in hallway  Level of Assistance Standby assist, set-up cues, supervision of patient - no hands on  Assistive Device Other (Comment) (IV pole)  Distance Ambulated (ft) 270 ft  Activity Response Tolerated well  $Mobility charge 1 Mobility   Mobility Specialist Progress Note  Received pt in chair having no complaints and agreeable to mobility. Pt was asymptomatic throughout ambulation and returned to room w/o fault. Left in chair w/ call bell in reach and all needs met.   Lucious Groves Mobility Specialist

## 2021-12-12 NOTE — Plan of Care (Signed)

## 2021-12-13 DIAGNOSIS — M726 Necrotizing fasciitis: Secondary | ICD-10-CM | POA: Diagnosis not present

## 2021-12-13 DIAGNOSIS — R6520 Severe sepsis without septic shock: Secondary | ICD-10-CM | POA: Diagnosis not present

## 2021-12-13 DIAGNOSIS — A419 Sepsis, unspecified organism: Secondary | ICD-10-CM | POA: Diagnosis not present

## 2021-12-13 DIAGNOSIS — N390 Urinary tract infection, site not specified: Secondary | ICD-10-CM | POA: Diagnosis not present

## 2021-12-13 DIAGNOSIS — N179 Acute kidney failure, unspecified: Secondary | ICD-10-CM | POA: Diagnosis not present

## 2021-12-13 LAB — COMPREHENSIVE METABOLIC PANEL
ALT: 25 U/L (ref 0–44)
AST: 57 U/L — ABNORMAL HIGH (ref 15–41)
Albumin: 1.7 g/dL — ABNORMAL LOW (ref 3.5–5.0)
Alkaline Phosphatase: 157 U/L — ABNORMAL HIGH (ref 38–126)
Anion gap: 6 (ref 5–15)
BUN: 12 mg/dL (ref 6–20)
CO2: 20 mmol/L — ABNORMAL LOW (ref 22–32)
Calcium: 7.8 mg/dL — ABNORMAL LOW (ref 8.9–10.3)
Chloride: 110 mmol/L (ref 98–111)
Creatinine, Ser: 1.23 mg/dL — ABNORMAL HIGH (ref 0.44–1.00)
GFR, Estimated: 51 mL/min — ABNORMAL LOW (ref >60)
Glucose, Bld: 92 mg/dL (ref 70–99)
Potassium: 3.8 mmol/L (ref 3.5–5.1)
Sodium: 136 mmol/L (ref 135–145)
Total Bilirubin: 0.9 mg/dL (ref 0.3–1.2)
Total Protein: 4.8 g/dL — ABNORMAL LOW (ref 6.5–8.1)

## 2021-12-13 LAB — GASTROINTESTINAL PANEL BY PCR, STOOL (REPLACES STOOL CULTURE)

## 2021-12-13 LAB — CBC WITH DIFFERENTIAL/PLATELET
Abs Immature Granulocytes: 0.42 10*3/uL — ABNORMAL HIGH (ref 0.00–0.07)
Basophils Absolute: 0.1 10*3/uL (ref 0.0–0.1)
Basophils Relative: 0 %
Eosinophils Absolute: 0.2 10*3/uL (ref 0.0–0.5)
Eosinophils Relative: 1 %
HCT: 24.4 % — ABNORMAL LOW (ref 36.0–46.0)
Hemoglobin: 7.8 g/dL — ABNORMAL LOW (ref 12.0–15.0)
Immature Granulocytes: 2 %
Lymphocytes Relative: 15 %
Lymphs Abs: 3.5 10*3/uL (ref 0.7–4.0)
MCH: 31 pg (ref 26.0–34.0)
MCHC: 32 g/dL (ref 30.0–36.0)
MCV: 96.8 fL (ref 80.0–100.0)
Monocytes Absolute: 2.4 10*3/uL — ABNORMAL HIGH (ref 0.1–1.0)
Monocytes Relative: 10 %
Neutro Abs: 17.4 10*3/uL — ABNORMAL HIGH (ref 1.7–7.7)
Neutrophils Relative %: 72 %
Platelets: 350 10*3/uL (ref 150–400)
RBC: 2.52 MIL/uL — ABNORMAL LOW (ref 3.87–5.11)
RDW: 15.4 % (ref 11.5–15.5)
WBC: 23.9 10*3/uL — ABNORMAL HIGH (ref 4.0–10.5)
nRBC: 0 % (ref 0.0–0.2)

## 2021-12-13 LAB — MAGNESIUM: Magnesium: 1.8 mg/dL (ref 1.7–2.4)

## 2021-12-13 LAB — PHOSPHORUS: Phosphorus: 3.3 mg/dL (ref 2.5–4.6)

## 2021-12-13 MED ORDER — CEFADROXIL 500 MG PO CAPS: 1000.0000 mg | ORAL_CAPSULE | Freq: Two times a day (BID) | ORAL | 0 refills | Status: AC

## 2021-12-13 MED ORDER — FERROUS SULFATE 325 (65 FE) MG PO TABS: 325.0000 mg | ORAL_TABLET | Freq: Every day | ORAL | 3 refills | Status: DC

## 2021-12-13 MED ORDER — BOOST / RESOURCE BREEZE PO LIQD CUSTOM
1.0000 | Freq: Three times a day (TID) | ORAL | Status: DC
  Administered 2021-12-13: 1 via ORAL

## 2021-12-13 MED ORDER — POTASSIUM CHLORIDE CRYS ER 20 MEQ PO TBCR: 20.0000 meq | EXTENDED_RELEASE_TABLET | Freq: Every day | ORAL | 0 refills | Status: DC

## 2021-12-13 NOTE — Discharge Summary (Signed)
Physician Discharge Summary   Patient: Jessica Lynn MRN: [id number] DOB: 1960/06/26  Admit date:     12/07/2021  Discharge date: 12/13/21  Discharge Physician: Elmarie Shiley   PCP: Cassandria Anger, MD   Recommendations at discharge:    Follow up with Dr Sharol Given further care LE wound Needs CBC to follow WBC, if persist needs hematology evaluation.  Needs follow up with GI for evaluation abnormal CT finding duodenitis ?   Discharge Diagnoses: Principal Problem:   Sepsis with acute organ dysfunction without septic shock ([place]) Active Problems:   AKI (acute kidney injury) ([place])   Essential hypertension   Necrotizing fasciitis (HCC)   Thrombocytopenia (HCC)   Hypokalemia   Obesity (BMI 30-39.9)   Multiple open wounds of right lower extremity   Urinary tract infection without hematuria  Resolved Problems:   * No resolved hospital problems. [place] [facility] Course: 61 year old with past medical history significant for hypertension, necrotizing fasciitis secondary to Staph aureus in the right leg in July 2023 presented to the ED with 4 days history of lethargy, altered mental status.  Patient has been more sleepy for the last 4 days.  Not eating as much or drinking as much.  Patient also had a fever.   Patient presented with tachycardia heart rate 107, temperature 101, white count 27, right tibia-fibula x-ray shows soft tissue ulceration and subcutaneous edema.  No evidence of osteomyelitis.  Chest x-ray negative for pulmonary edema.   Patient admitted with sepsis secondary to E. coli bacteremia.  Assessment and Plan: 1-Sepsis with acute organ dysfunction.  Secondary to UTI.  -Treated with Ancef. She has been transition to cefadroxil by ID>  -LE infection appears improved. MRI was negative for osteomyelitis.  -MRI LE; Extensive right lower extremity cellulitis with multiple thin crescentic residual collections along the fascial surface of the anterior and lateral leg, which appear  to communicate with the skin surface, correlate with drainage. Of note, the anterior collection appears to communicate with the anterior muscular compartment via a fascial defect. Nonspecific intramuscular edema throughout the lower leg, potentially infectious myositis of the anterior and lateral compartments. No evidence of osteomyelitis. -Evaluated by Dr Sharol Given who recommend local care, follow up in the office.   Leukocytosis: persist. ID consulted. Chest x ray no PNA, Blood culture; no growth to date. Renal US negative. CT abdomen pelvis: Inflammatory changes and stranding centered at the third portion of the duodenum likely related to diffuse edema. Duodenitis is less likely but not excluded. Clinical correlation is recommended Patient report diarrhea. GI pathogen negative. Diarrhea resolved.  Plan to discharge on cefadroxil to complete 5 more days, total 10 days.    2-E. coli bacteremia: Leukocytosis persists, plan to check blood cultures. Suspect secondary to UTI. Urine culture initially not send.  Urine culture. No growth to date Renal US negative, transaminases, leukocytosis , will proceed with CT abdomen.    AKI: Received IV fluid Stable, resume lasix.potassium at discharge.   Thrombocytopenia;  Resolved,. In setting of infection.  Haptoglobin normal.  Path review anemia neutrophilia with monocytosis, favor reactive   Bilateral lower extremity wounds: Evaluated by Dr. Sharol Given who recommended serial wraps of both legs and follow-up in the office to resume compression wraps.  No evidence of current necrotizing fasciitis On antibiotics.    Hypokalemia: Replete  Hypomagnesemia: Resolved Obesity; need lifestyle modification  ? Duodenitis; needs follow up with GI  Estimated body mass index is 35.07 kg/m as calculated from the following:   Height  as of this encounter: '5\' 5"'$  (1.651 m).   Weight as of this encounter: 95.6 kg. Anemia; started ferrous sulfate.        Consultants: ID,  Dr Sharol Given Procedures performed:  Disposition: Home Diet recommendation:  Discharge Diet Orders (From admission, onward)     Start     Ordered   12/13/21 0000  Diet - low sodium heart healthy        12/13/21 1338           Cardiac diet DISCHARGE MEDICATION: Allergies as of 12/13/2021       Reactions   Crestor [rosuvastatin] Other (See Comments)   cramps   Oxycodone Itching        Medication List     STOP taking these medications    naproxen 500 MG tablet Commonly known as: NAPROSYN   prednisoLONE acetate 1 % ophthalmic suspension Commonly known as: PRED FORTE   predniSONE 10 MG tablet Commonly known as: DELTASONE       TAKE these medications    cefadroxil 500 MG capsule Commonly known as: DURICEF Take 2 capsules (1,000 mg total) by mouth 2 (two) times daily for 5 days.   dorzolamide-timolol 22.3-6.8 MG/ML ophthalmic solution Commonly known as: COSOPT Place 1 drop into the right eye 2 (two) times daily.   ferrous sulfate 325 (65 FE) MG tablet Take 1 tablet (325 mg total) by mouth daily.   folic acid 1 MG tablet Commonly known as: FOLVITE TAKE 1 TABLET(1 MG) BY MOUTH DAILY What changed: See the new instructions.   furosemide 40 MG tablet Commonly known as: LASIX Take 40 mg by mouth daily.   HYDROcodone-acetaminophen 5-325 MG tablet Commonly known as: NORCO/VICODIN Take 1 tablet by mouth 3 (three) times daily as needed for severe pain.   hydrOXYzine 25 MG tablet Commonly known as: ATARAX TAKE 1 TABLET BY MOUTH EVERY 8 HOURS AS NEEDED FOR ITCHING What changed: See the new instructions.   methocarbamol 500 MG tablet Commonly known as: ROBAXIN Take 1 tablet (500 mg total) by mouth every 6 (six) hours as needed for muscle spasms.   montelukast 10 MG tablet Commonly known as: SINGULAIR TAKE 1 TABLET(10 MG) BY MOUTH DAILY What changed: See the new instructions.   potassium chloride SA 20 MEQ tablet Commonly known as: KLOR-CON M Take 1 tablet (20  mEq total) by mouth daily.   Santyl 250 UNIT/GM ointment Generic drug: collagenase Apply 1 Application topically daily.   Vitamin D3 50 MCG (2000 UT) capsule Take 1 capsule (2,000 Units total) by mouth daily.   Zinc Sulfate 220 (50 Zn) MG Tabs Take 1 tablet (220 mg total) by mouth daily.   zolpidem 10 MG tablet Commonly known as: AMBIEN Take 1 tablet (10 mg total) by mouth at bedtime as needed for sleep.               Discharge Care Instructions  (From admission, onward)           Start     Ordered   12/13/21 0000  Discharge wound care:       Comments: See above   12/13/21 1338            Follow-up Information     [name], MD Follow up in 1 week(s).   Specialty: Orthopedic Surgery Contact information: [address] [place] [address] [phone]                Discharge Exam: Danley Danker Weights  12/11/21 0437 12/12/21 0600 12/13/21 0411  Weight: 95.7 kg 95.6 kg 95.2 kg   General NAD  Condition at discharge: stable  The results of significant diagnostics from this hospitalization (including imaging, microbiology, ancillary and laboratory) are listed below for reference.   Imaging Studies: CT ABDOMEN PELVIS WO CONTRAST  Result Date: 12/11/2021 CLINICAL DATA:  Sepsis.  Leukocytosis. EXAM: CT ABDOMEN AND PELVIS WITHOUT CONTRAST TECHNIQUE: Multidetector CT imaging of the abdomen and pelvis was performed following the standard protocol without IV contrast. RADIATION DOSE REDUCTION: This exam was performed according to the departmental dose-optimization program which includes automated exposure control, adjustment of the mA and/or kV according to patient size and/or use of iterative reconstruction technique. COMPARISON:  Renal ultrasound dated 12/11/2021. FINDINGS: Evaluation of this exam is limited in the absence of intravenous contrast. Lower chest: Partially visualized small bilateral pleural effusions and partial compressive atelectasis of  the visualized lower lobes. Pneumonia is not excluded. Clinical correlation is recommended. There is coronary vascular calcification. No intra-abdominal free air.  Small ascites. Hepatobiliary: The liver is unremarkable. No biliary dilatation. Layering gallstone within gallbladder. No pericholecystic fluid or evidence of acute cholecystitis by CT. Pancreas: Unremarkable. No pancreatic ductal dilatation or surrounding inflammatory changes. Spleen: Normal in size without focal abnormality. Adrenals/Urinary Tract: The adrenal glands are unremarkable. There is no hydronephrosis or nephrolithiasis on either side. There is a 2 cm exophytic left renal posterior upper pole cyst. The visualized ureters and urinary bladder appear unremarkable. Stomach/Bowel: There is inflammatory changes and stranding centered at the third portion of the duodenum which may be related to diffuse edema. Duodenitis is less likely but not excluded clinical correlation is recommended. There is no bowel obstruction. The appendix is normal. Vascular/Lymphatic: Mild aortoiliac atherosclerotic disease. The IVC is grossly unremarkable. No portal venous gas. There is no adenopathy. Reproductive: The uterus is anteverted and grossly unremarkable. No adnexal masses. Other: Diffuse subcutaneous edema and anasarca.  No fluid collection Musculoskeletal: Degenerative changes of the spine. No acute osseous pathology. IMPRESSION: 1. Inflammatory changes and stranding centered at the third portion of the duodenum likely related to diffuse edema. Duodenitis is less likely but not excluded. Clinical correlation is recommended. No bowel obstruction. Normal appendix. 2. Cholelithiasis. 3. Partially visualized small bilateral pleural effusions and partial compressive atelectasis of the visualized lower lobes. Pneumonia is not excluded. 4. Aortic Atherosclerosis (ICD10-I70.0). Electronically Signed   By: Anner Crete M.D.   On: 12/11/2021 22:19   US  RENAL  Result Date: 12/11/2021 CLINICAL DATA:  Urinary tract infection EXAM: RENAL / URINARY TRACT ULTRASOUND COMPLETE COMPARISON:  None Available. FINDINGS: Right Kidney: Renal measurements: 10.4 x 5.7 x 6.0 cm = volume: 185 mL. Echogenicity within normal limits. No mass or hydronephrosis visualized. Left Kidney: Renal measurements: 10.7 x 6.2 x 6.9 cm = volume: 239 mL. Echogenicity within normal limits. No mass or hydronephrosis visualized. Bladder: Appears normal for degree of bladder distention. Other: Small bilateral pleural effusions. IMPRESSION: Normal kidneys. Electronically Signed   By: Suzy Bouchard M.D.   On: 12/11/2021 15:36   DG CHEST PORT 1 VIEW  Result Date: 12/11/2021 CLINICAL DATA:  Leukocytosis. EXAM: PORTABLE CHEST 1 VIEW COMPARISON:  12/07/2021 and older studies. FINDINGS: Cardiac silhouette is normal in size.  No mediastinal hilar masses. There is medial lung base opacities, left greater than right, likely atelectasis. Remainder of the lungs is clear. No convincing pleural effusion. No pneumothorax. Skeletal structures are grossly intact. IMPRESSION: 1. Mild medial lung base opacities consistent with atelectasis. No  convincing pneumonia and no significant change compared to the prior exam. Electronically Signed   By: Lajean Manes M.D.   On: 12/11/2021 13:26   MR TIBIA FIBULA RIGHT W WO CONTRAST  Result Date: 12/09/2021 CLINICAL DATA:  Posttraumatic neuralgia, shingles, multiple wounds of the lower extremities posterior irrigation and debridement EXAM: MRI OF LOWER RIGHT EXTREMITY WITHOUT AND WITH CONTRAST TECHNIQUE: Multiplanar, multisequence MR imaging of the right lower extremity was performed both before and after administration of intravenous contrast. CONTRAST:  73m GADAVIST GADOBUTROL 1 MMOL/ML IV SOLN COMPARISON:  Radiograph 12/08/2021, MRI 10/29/2021 FINDINGS: Bones/Joint/Cartilage Partially visualized bone infarcts of the femoral condyles. There is no other significant marrow  signal alteration. The cortex is intact. Muscles and Tendons There is diffuse intramuscular edema most prominent in the anterior and lateral compartments with mild inter fascial edema. No intramuscular collection. Achilles tendon and other partially visualized ankle tendons appear intact. Soft tissues There is diffuse skin thickening and subcutaneous soft tissue swelling of the lower extremity, with multiple soft tissue wounds noted laterally. There is a thin crescentic collection along the anterolateral lower leg at the fascial surface which may connect with a lateral cutaneous wound, measuring up to 2.8 x 0.5 cm in the axial plane and 12.2 cm and proximal-distal extent (series 13, image 51, series 12 is 13). Communication with the skin surface is likely on series 13, image 50). This appears to connect with the anterior compartment of the lower leg through fascial defect (series 9, image 54). Tiny adjacent collection measuring 0.6 x 0.4 cm which appears to communicate with the skin surface (series 13 images 44 and 45). Laterally, there is a thin crescentic collection underlying a soft tissue wound of the lateral leg at the fascial surface, measuring 1.0 x 0.2 cm in the axial plane and 5.7 cm in proximal-distal extent (series 13, image 56, series 12, image 11). Superiorly and far laterally, there is a thin crescentic collection at the fascial surface, measuring 2.4 x 0.3 by 5.1 cm (series 9, image 34). IMPRESSION: Extensive right lower extremity cellulitis with multiple thin crescentic residual collections along the fascial surface of the anterior and lateral leg, which appear to communicate with the skin surface, correlate with drainage. Of note, the anterior collection appears to communicate with the anterior muscular compartment via a fascial defect. Nonspecific intramuscular edema throughout the lower leg, potentially infectious myositis of the anterior and lateral compartments. No evidence of osteomyelitis.  Electronically Signed   By: JMaurine SimmeringM.D.   On: 12/09/2021 11:19   DG Tibia/Fibula Right  Result Date: 12/08/2021 CLINICAL DATA:  Preprocedure.  Rule out metal in leg prior to MRI. EXAM: RIGHT TIBIA AND FIBULA - 2 VIEW COMPARISON:  Radiograph yesterday. Right lower extremity MRI 10/29/2021 FINDINGS: Stable skin irregularity about the lateral aspect of the lower leg. No radiopaque or metallic foreign body. There is no tracking soft tissue gas. Generalized subcutaneous edema. No fracture, periosteal reaction or bone destruction. Overlying dressing in place. IMPRESSION: Stable radiograph from yesterday. Stable skin irregularity about the lateral aspect of the lower leg. No radiopaque or metallic foreign body. Generalized subcutaneous edema. Electronically Signed   By: MKeith RakeM.D.   On: 12/08/2021 17:10   MR LUMBAR SPINE WO CONTRAST  Result Date: 12/08/2021 CLINICAL DATA:  Initial evaluation for low back pain, infection suspected. EXAM: MRI LUMBAR SPINE WITHOUT CONTRAST TECHNIQUE: Multiplanar, multisequence MR imaging of the lumbar spine was performed. No intravenous contrast was administered. COMPARISON:  Prior MRI from 08/31/2014.  FINDINGS: Segmentation: Examination technically limited as the patient was unable to tolerate the full length of the exam. Sagittal sequences only were obtained. No axial images available for review. Standard segmentation. Lowest well-formed disc space labeled the L5-S1 level. Alignment: 7 mm anterolisthesis of L4 on L5, likely chronic and facet mediated. Trace degenerative retrolisthesis of L1 on L2 and L2 on L3. Underlying mild scoliosis. Vertebrae: Vertebral body height maintained without acute or chronic fracture. Bone marrow signal intensity within normal limits. No discrete or worrisome osseous lesions. Mild reactive edema present about the L2-3 and L3-4 facets, favored to be degenerative in nature and due to facet arthritis. No other evidence for osteomyelitis  discitis or other infection within the lumbar spine. Conus medullaris and cauda equina: Conus extends to the L1-2 level. Conus and cauda equina appear normal. Paraspinal and other soft tissues: Diffuse edema seen throughout the subcutaneous fat of the lower back, nonspecific. No visible loculated collections. Fibroid uterus noted. Disc levels: T12-L1: Mild disc bulge with endplate spurring. No significant stenosis. L1-2: Mild disc bulge with endplate spurring. Bilateral facet hypertrophy. No significant spinal stenosis. Foramina appear grossly patent. L2-3: Degenerative intervertebral disc space narrowing with diffuse disc bulge and disc desiccation. Reactive endplate spurring. No significant spinal stenosis seen on this limited exam. Mild-to-moderate bilateral foraminal narrowing. L3-4: Degenerative intervertebral disc space narrowing with mild diffuse disc bulge and disc desiccation. Reactive endplate spurring. Moderate facet hypertrophy. No significant spinal stenosis on this limited exam. Mild-to-moderate bilateral facet hypertrophy. L4-5: Anterolisthesis. Disc desiccation with broad posterior pseudo disc bulge/uncovering. Moderate bilateral facet arthrosis. No significant spinal stenosis on this limited exam. Mild to moderate bilateral foraminal narrowing. L5-S1: No significant disc bulge. Mild facet hypertrophy. No significant stenosis. IMPRESSION: 1. Technically limited study due to the patient's inability to tolerate the full length of the exam. Sagittal sequences only were obtained. 2. Diffuse edema throughout the subcutaneous fat of the lower back. Finding is nonspecific, and could be related to overall volume status. Regional cellulitis could also have this appearance. Correlation with physical exam recommended. No loculated collections. 3. No other evidence for acute infection within the lumbar spine. 4. Underlying moderate multilevel degenerative spondylosis and facet arthrosis as above. No significant  spinal stenosis evident on this limited exam. Mild-to-moderate bilateral foraminal narrowing at L2-3 through L4-5. Electronically Signed   By: Jeannine Boga M.D.   On: 12/08/2021 01:18   DG Tibia/Fibula Right  Result Date: 12/07/2021 CLINICAL DATA:  Chronic right lower extremity wound EXAM: RIGHT TIBIA AND FIBULA - 2 VIEW COMPARISON:  None Available. FINDINGS: Normal alignment. No acute fracture or dislocation. No osseous erosions or abnormal periosteal reaction. There is diffuse subcutaneous edema involving the right lower extremity. Shallow ulcer noted along the medial aspect of the right lower extremity at the level of the mid diaphysis of the tibia and fibula. IMPRESSION: Soft tissue ulcer and diffuse subcutaneous edema. No radiographic evidence of osteomyelitis. Electronically Signed   By: Fidela Salisbury M.D.   On: 12/07/2021 20:05   DG Chest 2 View  Result Date: 12/07/2021 CLINICAL DATA:  Possible sepsis EXAM: CHEST - 2 VIEW COMPARISON:  10/28/2021 FINDINGS: Transverse diameter of heart is increased. This may be partly due to poor inspiration. There are no signs of alveolar pulmonary edema. Linear densities are seen in medial left lower lung field. There is no focal pulmonary consolidation. There is no significant pleural effusion or pneumothorax. IMPRESSION: New linear densities in the medial left lower lung fields suggest subsegmental atelectasis. There are  no signs of pulmonary edema or focal pulmonary consolidation. Electronically Signed   By: Elmer Picker M.D.   On: 12/07/2021 18:19    Microbiology: Results for orders placed or performed during the hospital encounter of 12/07/21  Culture, blood (Routine x 2)     Status: Abnormal   Collection Time: 12/07/21  5:43 PM   Specimen: BLOOD  Result Value Ref Range Status   Specimen Description BLOOD SITE NOT SPECIFIED  Final   Special Requests   Final    BOTTLES DRAWN AEROBIC AND ANAEROBIC Blood Culture adequate volume   Culture   Setup Time   Final    GRAM NEGATIVE RODS IN BOTH AEROBIC AND ANAEROBIC BOTTLES CRITICAL RESULT CALLED TO, READ BACK BY AND VERIFIED WITH: Dorien Chihuahua [id number] FCP Performed at [facility], [place] [address] [place], [place] [address]    Culture ESCHERICHIA COLI (A)  Final   Report Status 12/10/2021 FINAL  Final   Organism ID, Bacteria ESCHERICHIA COLI  Final      Susceptibility   Escherichia coli - MIC*    AMPICILLIN >=32 RESISTANT Resistant     CEFAZOLIN <=4 SENSITIVE Sensitive     CEFEPIME <=0.12 SENSITIVE Sensitive     CEFTAZIDIME <=1 SENSITIVE Sensitive     CEFTRIAXONE <=0.25 SENSITIVE Sensitive     CIPROFLOXACIN <=0.25 SENSITIVE Sensitive     GENTAMICIN <=1 SENSITIVE Sensitive     IMIPENEM <=0.25 SENSITIVE Sensitive     TRIMETH/SULFA <=20 SENSITIVE Sensitive     AMPICILLIN/SULBACTAM 8 SENSITIVE Sensitive     PIP/TAZO <=4 SENSITIVE Sensitive     * ESCHERICHIA COLI  Blood Culture ID Panel (Reflexed)     Status: Abnormal   Collection Time: 12/07/21  5:43 PM  Result Value Ref Range Status   Enterococcus faecalis NOT DETECTED NOT DETECTED Final   Enterococcus Faecium NOT DETECTED NOT DETECTED Final   Listeria monocytogenes NOT DETECTED NOT DETECTED Final   Staphylococcus species NOT DETECTED NOT DETECTED Final   Staphylococcus aureus (BCID) NOT DETECTED NOT DETECTED Final   Staphylococcus epidermidis NOT DETECTED NOT DETECTED Final   Staphylococcus lugdunensis NOT DETECTED NOT DETECTED Final   Streptococcus species NOT DETECTED NOT DETECTED Final   Streptococcus agalactiae NOT DETECTED NOT DETECTED Final   Streptococcus pneumoniae NOT DETECTED NOT DETECTED Final   Streptococcus pyogenes NOT DETECTED NOT DETECTED Final   A.calcoaceticus-baumannii NOT DETECTED NOT DETECTED Final   Bacteroides fragilis NOT DETECTED NOT DETECTED Final   Enterobacterales DETECTED (A) NOT DETECTED Final    Comment: Enterobacterales represent a large order of gram negative bacteria, not  a single organism. CRITICAL RESULT CALLED TO, READ BACK BY AND VERIFIED WITH: PHARMD HEATHER W [id number] FCP    Enterobacter cloacae complex NOT DETECTED NOT DETECTED Final   Escherichia coli DETECTED (A) NOT DETECTED Final    Comment: CRITICAL RESULT CALLED TO, READ BACK BY AND VERIFIED WITH: PHARMD HEATHER W [id number] FCP    Klebsiella aerogenes NOT DETECTED NOT DETECTED Final   Klebsiella oxytoca NOT DETECTED NOT DETECTED Final   Klebsiella pneumoniae NOT DETECTED NOT DETECTED Final   Proteus species NOT DETECTED NOT DETECTED Final   Salmonella species NOT DETECTED NOT DETECTED Final   Serratia marcescens NOT DETECTED NOT DETECTED Final   Haemophilus influenzae NOT DETECTED NOT DETECTED Final   Neisseria meningitidis NOT DETECTED NOT DETECTED Final   Pseudomonas aeruginosa NOT DETECTED NOT DETECTED Final   Stenotrophomonas maltophilia NOT DETECTED NOT DETECTED Final   Candida  albicans NOT DETECTED NOT DETECTED Final   Candida auris NOT DETECTED NOT DETECTED Final   Candida glabrata NOT DETECTED NOT DETECTED Final   Candida krusei NOT DETECTED NOT DETECTED Final   Candida parapsilosis NOT DETECTED NOT DETECTED Final   Candida tropicalis NOT DETECTED NOT DETECTED Final   Cryptococcus neoformans/gattii NOT DETECTED NOT DETECTED Final   CTX-M ESBL NOT DETECTED NOT DETECTED Final   Carbapenem resistance IMP NOT DETECTED NOT DETECTED Final   Carbapenem resistance KPC NOT DETECTED NOT DETECTED Final   Carbapenem resistance NDM NOT DETECTED NOT DETECTED Final   Carbapenem resist OXA 48 LIKE NOT DETECTED NOT DETECTED Final   Carbapenem resistance VIM NOT DETECTED NOT DETECTED Final    Comment: Performed at [facility], [address], [place], [place] [address]  Culture, blood (Routine x 2)     Status: Abnormal   Collection Time: 12/07/21  9:00 PM   Specimen: BLOOD  Result Value Ref Range Status   Specimen Description BLOOD SITE NOT SPECIFIED  Final   Special Requests    Final    BOTTLES DRAWN AEROBIC AND ANAEROBIC Blood Culture adequate volume   Culture  Setup Time   Final    GRAM NEGATIVE RODS IN BOTH AEROBIC AND ANAEROBIC BOTTLES CRITICAL VALUE NOTED.  VALUE IS CONSISTENT WITH PREVIOUSLY REPORTED AND CALLED VALUE.    Culture (A)  Final    ESCHERICHIA COLI SUSCEPTIBILITIES PERFORMED ON PREVIOUS CULTURE WITHIN THE LAST 5 DAYS. Performed at [facility], [place] [address] [place], [place] [address]    Report Status 12/10/2021 FINAL  Final  Resp Panel by RT-PCR (Flu A&B, Covid) Anterior Nasal Swab     Status: None   Collection Time: 12/08/21 12:50 AM   Specimen: Anterior Nasal Swab  Result Value Ref Range Status   SARS Coronavirus 2 by RT PCR NEGATIVE NEGATIVE Final    Comment: (NOTE) SARS-CoV-2 target nucleic acids are NOT DETECTED.  The SARS-CoV-2 RNA is generally detectable in upper respiratory specimens during the acute phase of infection. The lowest concentration of SARS-CoV-2 viral copies this assay can detect is 138 copies/mL. A negative result does not preclude SARS-Cov-2 infection and should not be used as the sole basis for treatment or other patient management decisions. A negative result may occur with  improper specimen collection/handling, submission of specimen other than nasopharyngeal swab, presence of viral mutation(s) within the areas targeted by this assay, and inadequate number of viral copies(<138 copies/mL). A negative result must be combined with clinical observations, patient history, and epidemiological information. The expected result is Negative.  Fact Sheet for Patients:  EntrepreneurPulse.com.au  Fact Sheet for Healthcare Providers:  IncredibleEmployment.be  This test is no t yet approved or cleared by the [place] FDA and  has been authorized for detection and/or diagnosis of SARS-CoV-2 by FDA under an Emergency Use Authorization (EUA). This EUA will remain  in effect  (meaning this test can be used) for the duration of the COVID-19 declaration under Section 564(b)(1) of the Act, 21 U.S.C.section 360bbb-3(b)(1), unless the authorization is terminated  or revoked sooner.       Influenza A by PCR NEGATIVE NEGATIVE Final   Influenza B by PCR NEGATIVE NEGATIVE Final    Comment: (NOTE) The Xpert Xpress SARS-CoV-2/FLU/RSV plus assay is intended as an aid in the diagnosis of influenza from Nasopharyngeal swab specimens and should not be used as a sole basis for treatment. Nasal washings and aspirates are unacceptable for Xpert Xpress SARS-CoV-2/FLU/RSV  testing.  Fact Sheet for Patients: EntrepreneurPulse.com.au  Fact Sheet for Healthcare Providers: IncredibleEmployment.be  This test is not yet approved or cleared by the [place] FDA and has been authorized for detection and/or diagnosis of SARS-CoV-2 by FDA under an Emergency Use Authorization (EUA). This EUA will remain in effect (meaning this test can be used) for the duration of the COVID-19 declaration under Section 564(b)(1) of the Act, 21 U.S.C. section 360bbb-3(b)(1), unless the authorization is terminated or revoked.  Performed at [facility], [place] [address], [place], [place] [address]   Culture, blood (Routine X 2) w Reflex to ID Panel     Status: None (Preliminary result)   Collection Time: 12/11/21  1:05 PM   Specimen: BLOOD  Result Value Ref Range Status   Specimen Description BLOOD BLOOD RIGHT HAND  Final   Special Requests   Final    BOTTLES DRAWN AEROBIC AND ANAEROBIC Blood Culture adequate volume   Culture   Final    NO GROWTH 2 DAYS Performed at [facility], [place] [address] [place], [place] [address]    Report Status PENDING  Incomplete  Culture, blood (Routine X 2) w Reflex to ID Panel     Status: None (Preliminary result)   Collection Time: 12/11/21  1:13 PM   Specimen: BLOOD  Result Value Ref Range Status   Specimen  Description BLOOD BLOOD RIGHT HAND  Final   Special Requests   Final    BOTTLES DRAWN AEROBIC AND ANAEROBIC Blood Culture adequate volume   Culture   Final    NO GROWTH 2 DAYS Performed at [facility], [place] [address] [place], [place] [address]    Report Status PENDING  Incomplete  Urine Culture     Status: None   Collection Time: 12/11/21  1:55 PM   Specimen: Urine, Clean Catch  Result Value Ref Range Status   Specimen Description URINE, CLEAN CATCH  Final   Special Requests NONE  Final   Culture   Final    NO GROWTH Performed at [facility], [place] [address], [place], [place] [address]    Report Status 12/12/2021 FINAL  Final  Gastrointestinal Panel by PCR , Stool     Status: None   Collection Time: 12/12/21  6:19 PM   Specimen: Stool  Result Value Ref Range Status   Campylobacter species NOT DETECTED NOT DETECTED Final   Plesimonas shigelloides NOT DETECTED NOT DETECTED Final   Salmonella species NOT DETECTED NOT DETECTED Final   Yersinia enterocolitica NOT DETECTED NOT DETECTED Final   Vibrio species NOT DETECTED NOT DETECTED Final   Vibrio cholerae NOT DETECTED NOT DETECTED Final   Enteroaggregative E coli (EAEC) NOT DETECTED NOT DETECTED Final   Enteropathogenic E coli (EPEC) NOT DETECTED NOT DETECTED Final   Enterotoxigenic E coli (ETEC) NOT DETECTED NOT DETECTED Final   Shiga like toxin producing E coli (STEC) NOT DETECTED NOT DETECTED Final   Shigella/Enteroinvasive E coli (EIEC) NOT DETECTED NOT DETECTED Final   Cryptosporidium NOT DETECTED NOT DETECTED Final   Cyclospora cayetanensis NOT DETECTED NOT DETECTED Final   Entamoeba histolytica NOT DETECTED NOT DETECTED Final   Giardia lamblia NOT DETECTED NOT DETECTED Final   Adenovirus F40/41 NOT DETECTED NOT DETECTED Final   Astrovirus NOT DETECTED NOT DETECTED Final   Norovirus GI/GII NOT DETECTED NOT DETECTED Final   Rotavirus A NOT DETECTED NOT DETECTED Final   Sapovirus (I, II, IV, and V) NOT DETECTED  NOT DETECTED Final    Comment: Performed at [facility], [address] [place], [place] [address]    Labs: CBC: Recent Labs  Lab 12/09/21 0431 12/10/21 0240 12/11/21 0846 12/12/21 0301 12/13/21 0608  WBC 20.9* 21.8* 24.9* 24.9* 23.9*  NEUTROABS 14.2* 14.9* 18.8* 17.4* 17.4*  HGB 8.0* 8.4* 8.8* 8.2* 7.8*  HCT 23.6* 25.5* 26.3* 24.1* 24.4*  MCV 92.5 93.1 94.9 94.1 96.8  PLT 88* 142* 233 276 356   Basic Metabolic Panel: Recent Labs  Lab 12/09/21 0431 12/10/21 0240 12/11/21 0846 12/12/21 0301 12/13/21 0608  NA 138 137 137 135 136  K 3.3* 3.4* 3.7 3.2* 3.8  CL 109 109 109 106 110  CO2 22 21* 23 22 20*  GLUCOSE 111* 109* 101* 103* 92  BUN 21* '19 17 13 12  '$ CREATININE 1.55* 1.48* 1.42* 1.34* 1.23*  CALCIUM 7.8* 7.9* 7.9* 7.6* 7.8*  MG 1.9 1.9 2.3 2.0 1.8  PHOS 3.3 3.0 3.4 3.1 3.3   Liver Function Tests: Recent Labs  Lab 12/09/21 0431 12/10/21 0240 12/11/21 0846 12/12/21 0301 12/13/21 0608  AST 46* 126* 122* 106* 57*  ALT 23 56* 70* 54* 25  ALKPHOS 137* 191* 194* 178* 157*  BILITOT 1.2 1.2 1.3* 1.1 0.9  PROT 4.2* 4.6* 4.8* 4.8* 4.8*  ALBUMIN 1.6* 1.6* 1.6* 1.7* 1.7*   CBG: No results for input(s): "GLUCAP" in the last 168 hours.  Discharge time spent: greater than 30 minutes.  Signed: Elmarie Shiley, MD [facility] 12/13/2021

## 2021-12-13 NOTE — Progress Notes (Signed)
[place] for Infectious Disease  Date of Admission:  12/07/2021           Reason for visit: Follow up on leukocytosis  Current antibiotics: Cefadroxil  ASSESSMENT:    61 y.o. female admitted with:  E coli bacteremia:  Source not entirely clear but suspect secondary to urinary source with her symptomatology at admission and abnormal UA.  Other potential sources include her lower extremity wounds with recent necrotizing infection secondary to MSSA status post debridement in July 2023.  MRI this admission did show findings of thin crescenteric residual collections along the fascial surface of the anterior and lateral leg, however, Dr. Sharol Given has evaluated and felt that her wounds were doing well at this time.  She also underwent CT abdomen/pelvis that was relatively unremarkable for an explanation as well. Leukocytosis: Her WBC remains elevated and slightly improved today. Elevated LFTs: CT abdomen/pelvis noted cholelithiasis but no findings of pericholecystic fluid or evidence of cholecystitis based on CT or on her exam.  Her LFTs are improved today as well. Bilateral lower extremity wounds: As noted above, recent admission for MSSA necrotizing fasciitis status postdebridement x2 with Dr. Sharol Given in July 2023.  Wounds appear to be healing well at this time.   Acute kidney injury: This has improved. Sepsis: Due to #1 and improved.  Diarrhea: Improved.  GI panel negative.  RECOMMENDATIONS:    Continue cefadroxil 1gm BID and will do 10 days total antibiotics ending 12/17/21 I think reasonable to discharge home if okay with primary team Wound care Will sign off please call as needed   Principal Problem:   Sepsis with acute organ dysfunction without septic shock [facility]) Active Problems:   Essential hypertension   AKI (acute kidney injury) ([place])   Necrotizing fasciitis ([place])   Thrombocytopenia ([place])   Hypokalemia   Obesity (BMI 30-39.9)   Multiple open wounds of right lower  extremity   Urinary tract infection without hematuria    MEDICATIONS:    Scheduled Meds:  cefadroxil  1,000 mg Oral BID   feeding supplement  1 Container Oral TID BM   Continuous Infusions: PRN Meds:.acetaminophen **OR** acetaminophen, ondansetron **OR** ondansetron (ZOFRAN) IV  SUBJECTIVE:   24 hour events:  She is feeling well.  No fevers.  Diarrhea improved/resolved.  She ate half her breakfast sandwich yesterday.  SHe hopes to go home soon.   Review of Systems  All other systems reviewed and are negative.     OBJECTIVE:   Blood pressure (!) 144/83, pulse 91, temperature 98.4 F (36.9 C), temperature source Oral, resp. rate 18, height '5\' 5"'$  (1.651 m), weight 95.2 kg, last menstrual period 06/06/2015, SpO2 100 %. Body mass index is 34.93 kg/m.  Physical Exam Constitutional:      Appearance: Normal appearance.  HENT:     Head: Normocephalic and atraumatic.  Eyes:     Extraocular Movements: Extraocular movements intact.     Conjunctiva/sclera: Conjunctivae normal.  Pulmonary:     Effort: Pulmonary effort is normal. No respiratory distress.  Abdominal:     General: There is no distension.     Palpations: Abdomen is soft.  Musculoskeletal:     Cervical back: Normal range of motion and neck supple.     Comments: Lower extremities are wrapped.   Skin:    General: Skin is warm and dry.  Neurological:     General: No focal deficit present.     Mental Status: She is alert and oriented to person,  place, and time.  Psychiatric:        Mood and Affect: Mood normal.        Behavior: Behavior normal.      Lab Results: Lab Results  Component Value Date   WBC 23.9 (H) 12/13/2021   HGB 7.8 (L) 12/13/2021   HCT 24.4 (L) 12/13/2021   MCV 96.8 12/13/2021   PLT 350 12/13/2021    Lab Results  Component Value Date   NA 136 12/13/2021   K 3.8 12/13/2021   CO2 20 (L) 12/13/2021   GLUCOSE 92 12/13/2021   BUN 12 12/13/2021   CREATININE 1.23 (H) 12/13/2021   CALCIUM  7.8 (L) 12/13/2021   GFRNONAA 51 (L) 12/13/2021   GFRAA 93 12/16/2019    Lab Results  Component Value Date   ALT 25 12/13/2021   AST 57 (H) 12/13/2021   ALKPHOS 157 (H) 12/13/2021   BILITOT 0.9 12/13/2021       Component Value Date/Time   CRP 14.2 (H) 12/07/2021 2016       Component Value Date/Time   ESRSEDRATE 45 (H) 12/07/2021 2016     I have reviewed the micro and lab results in Epic.  Imaging: CT ABDOMEN PELVIS WO CONTRAST  Result Date: 12/11/2021 CLINICAL DATA:  Sepsis.  Leukocytosis. EXAM: CT ABDOMEN AND PELVIS WITHOUT CONTRAST TECHNIQUE: Multidetector CT imaging of the abdomen and pelvis was performed following the standard protocol without IV contrast. RADIATION DOSE REDUCTION: This exam was performed according to the departmental dose-optimization program which includes automated exposure control, adjustment of the mA and/or kV according to patient size and/or use of iterative reconstruction technique. COMPARISON:  Renal ultrasound dated 12/11/2021. FINDINGS: Evaluation of this exam is limited in the absence of intravenous contrast. Lower chest: Partially visualized small bilateral pleural effusions and partial compressive atelectasis of the visualized lower lobes. Pneumonia is not excluded. Clinical correlation is recommended. There is coronary vascular calcification. No intra-abdominal free air.  Small ascites. Hepatobiliary: The liver is unremarkable. No biliary dilatation. Layering gallstone within gallbladder. No pericholecystic fluid or evidence of acute cholecystitis by CT. Pancreas: Unremarkable. No pancreatic ductal dilatation or surrounding inflammatory changes. Spleen: Normal in size without focal abnormality. Adrenals/Urinary Tract: The adrenal glands are unremarkable. There is no hydronephrosis or nephrolithiasis on either side. There is a 2 cm exophytic left renal posterior upper pole cyst. The visualized ureters and urinary bladder appear unremarkable. Stomach/Bowel:  There is inflammatory changes and stranding centered at the third portion of the duodenum which may be related to diffuse edema. Duodenitis is less likely but not excluded clinical correlation is recommended. There is no bowel obstruction. The appendix is normal. Vascular/Lymphatic: Mild aortoiliac atherosclerotic disease. The IVC is grossly unremarkable. No portal venous gas. There is no adenopathy. Reproductive: The uterus is anteverted and grossly unremarkable. No adnexal masses. Other: Diffuse subcutaneous edema and anasarca.  No fluid collection Musculoskeletal: Degenerative changes of the spine. No acute osseous pathology. IMPRESSION: 1. Inflammatory changes and stranding centered at the third portion of the duodenum likely related to diffuse edema. Duodenitis is less likely but not excluded. Clinical correlation is recommended. No bowel obstruction. Normal appendix. 2. Cholelithiasis. 3. Partially visualized small bilateral pleural effusions and partial compressive atelectasis of the visualized lower lobes. Pneumonia is not excluded. 4. Aortic Atherosclerosis (ICD10-I70.0). Electronically Signed   By: Anner Crete M.D.   On: 12/11/2021 22:19   US RENAL  Result Date: 12/11/2021 CLINICAL DATA:  Urinary tract infection EXAM: RENAL / URINARY TRACT ULTRASOUND COMPLETE COMPARISON:  None Available. FINDINGS: Right Kidney: Renal measurements: 10.4 x 5.7 x 6.0 cm = volume: 185 mL. Echogenicity within normal limits. No mass or hydronephrosis visualized. Left Kidney: Renal measurements: 10.7 x 6.2 x 6.9 cm = volume: 239 mL. Echogenicity within normal limits. No mass or hydronephrosis visualized. Bladder: Appears normal for degree of bladder distention. Other: Small bilateral pleural effusions. IMPRESSION: Normal kidneys. Electronically Signed   By: Suzy Bouchard M.D.   On: 12/11/2021 15:36   DG CHEST PORT 1 VIEW  Result Date: 12/11/2021 CLINICAL DATA:  Leukocytosis. EXAM: PORTABLE CHEST 1 VIEW COMPARISON:   12/07/2021 and older studies. FINDINGS: Cardiac silhouette is normal in size.  No mediastinal hilar masses. There is medial lung base opacities, left greater than right, likely atelectasis. Remainder of the lungs is clear. No convincing pleural effusion. No pneumothorax. Skeletal structures are grossly intact. IMPRESSION: 1. Mild medial lung base opacities consistent with atelectasis. No convincing pneumonia and no significant change compared to the prior exam. Electronically Signed   By: Lajean Manes M.D.   On: 12/11/2021 13:26     Imaging independently reviewed in Epic.    [facility] for Infectious [facility] [phone] pager 12/13/2021, 11:37 AM

## 2021-12-13 NOTE — Progress Notes (Signed)
Dressing change performed per wound care orders. Pt tolerated well.

## 2021-12-13 NOTE — TOC Transition Note (Signed)
Transition of Care [facility]) - CM/SW Discharge Note   Patient Details  Name: Jessica Lynn MRN: [id number] Date of Birth: [birth year]  Transition of Care [facility]) CM/SW Contact:  Zenon Mayo, RN Phone Number: 12/13/2021, 2:47 PM   Clinical Narrative:    Patient is for dc today, NCM set her up with Kenna Gilbert with [facility] states she can take referral.  Soc will begin 24 to 48 hrs post dc.  She states her daughter will transport her home today.  Also patient states she goes to Dr. Jess Barters office once a week for her leg wraps.  [facility]  will also be doing wound care for patient .     Final next level of care: [place] Barriers to Discharge: No Barriers Identified   Patient Goals and CMS Choice Patient states their goals for this hospitalization and ongoing recovery are:: return home CMS Medicare.gov Compare Post Acute Care list provided to:: Patient Choice offered to / list presented to : Patient  Discharge Placement                       Discharge Plan and Services                  DME Agency: NA       HH Arranged: RN, PT [facility] Agency: [facility] Date [facility] Agency Contacted: 12/13/21 Time [place]: [id number] Representative spoke with at [place]: [place] Determinants of Health ([place]) Interventions     Readmission Risk Interventions     No data to display

## 2021-12-13 NOTE — Progress Notes (Signed)
   12/13/21 1000  Mobility  Activity Ambulated independently in hallway  Level of Assistance Independent  Assistive Device None  Distance Ambulated (ft) 270 ft  Activity Response Tolerated well  $Mobility charge 1 Mobility   Mobility Specialist Progress Note  Received pt in chair having no complaints and agreeable to mobility. Pt was asymptomatic throughout ambulation and returned to room w/o fault. Left in chair w/ call bell in reach and all needs met.   Lucious Groves Mobility Specialist

## 2021-12-13 NOTE — Progress Notes (Signed)
Refused IV insertion for now. Patient requested to wait for the physician.

## 2021-12-13 NOTE — TOC Progression Note (Signed)
Transition of Care [facility]) - Progression Note    Patient Details  Name: Jessica Lynn MRN: [id number] Date of Birth: [birth year]  Transition of Care [facility]) CM/SW Contact  Zenon Mayo, RN Phone Number: 12/13/2021, 2:10 PM  Clinical Narrative:    NCM spoke with patient at the bedside, she states she has a walker and a bsc at home.  She states she will start weighing herself daily and checking her bp daily and eats a diet with salt , NCM informed her that  she needs to eat more fresh veges, NCM offered choice with Medicare.gov list for HHPT.  She states she had it before and she think it was with [facility], but she really does not have a preference. NCM will make referral to [facility].  Patient states her daughter will transport her home today.         Expected Discharge Plan and Services           Expected Discharge Date: 12/13/21                                     Social Determinants of Health (SDOH) Interventions    Readmission Risk Interventions     No data to display

## 2021-12-16 LAB — CULTURE, BLOOD (ROUTINE X 2)
Culture: NO GROWTH
Culture: NO GROWTH
Special Requests: ADEQUATE
Special Requests: ADEQUATE

## 2021-12-17 ENCOUNTER — Telehealth: Payer: Self-pay | Admitting: *Deleted

## 2021-12-17 NOTE — Telephone Encounter (Signed)
Pt called back and we scheduled her for 9/14 '@1020'$  for HOS FU

## 2021-12-17 NOTE — Telephone Encounter (Signed)
Transition Care Management Unsuccessful Follow-up Telephone Call  Date of discharge and from where:  12/17/21  Attempts:  1st Attempt  Reason for unsuccessful TCM follow-up call:  Left voice message to call back to make hosp f/u.Marland KitchenJohny Chess

## 2021-12-18 ENCOUNTER — Ambulatory Visit (INDEPENDENT_AMBULATORY_CARE_PROVIDER_SITE_OTHER): Payer: BC Managed Care – PPO | Admitting: Family

## 2021-12-18 ENCOUNTER — Encounter: Payer: Self-pay | Admitting: Family

## 2021-12-18 ENCOUNTER — Encounter: Payer: Self-pay | Admitting: Orthopedic Surgery

## 2021-12-18 DIAGNOSIS — L02415 Cutaneous abscess of right lower limb: Secondary | ICD-10-CM

## 2021-12-18 DIAGNOSIS — I87333 Chronic venous hypertension (idiopathic) with ulcer and inflammation of bilateral lower extremity: Secondary | ICD-10-CM

## 2021-12-18 DIAGNOSIS — I890 Lymphedema, not elsewhere classified: Secondary | ICD-10-CM | POA: Diagnosis not present

## 2021-12-18 NOTE — Telephone Encounter (Signed)
Transition Care Management Follow-up Telephone Call Date of discharge and from where: 09/085/23 How have you been since you were released from the hospital? No Any questions or concerns? No  Items Reviewed: Did the pt receive and understand the discharge instructions provided? Yes  Medications obtained and verified? Yes  Other? No  Any new allergies since your discharge? No  Dietary orders reviewed? No Do you have support at home? Yes   Home Care and Equipment/Supplies: Were home health services ordered? no If so, what is the name of the agency?   Has the agency set up a time to come to the patient's home? not applicable Were any new equipment or medical supplies ordered?  No What is the name of the medical supply agency?  Were you able to get the supplies/equipment? not applicable Do you have any questions related to the use of the equipment or supplies? No  Functional Questionnaire: (I = Independent and D = Dependent) ADLs: I  Bathing/Dressing- I  Meal Prep- I  Eating- I  Maintaining continence- I  Transferring/Ambulation- I  Managing Meds- I  Follow up appointments reviewed:  PCP Hospital f/u appt confirmed? Yes  Scheduled to see 12/19/21  Specialist Hospital f/u appt confirmed? Yes  Scheduled to see Dr. Sharol Given orthopedic   Are transportation arrangements needed? No  If their condition worsens, is the pt aware to call PCP or go to the Emergency Dept.? No Was the patient provided with contact information for the PCP's office or ED? Yes Was to pt encouraged to call back with questions or concerns? Yes
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
## 2021-12-19 ENCOUNTER — Ambulatory Visit (INDEPENDENT_AMBULATORY_CARE_PROVIDER_SITE_OTHER): Payer: BC Managed Care – PPO | Admitting: Internal Medicine

## 2021-12-19 ENCOUNTER — Telehealth: Payer: Self-pay

## 2021-12-19 ENCOUNTER — Encounter: Payer: Self-pay | Admitting: Internal Medicine

## 2021-12-19 DIAGNOSIS — L959 Vasculitis limited to the skin, unspecified: Secondary | ICD-10-CM

## 2021-12-19 DIAGNOSIS — D649 Anemia, unspecified: Secondary | ICD-10-CM

## 2021-12-19 DIAGNOSIS — M159 Polyosteoarthritis, unspecified: Secondary | ICD-10-CM

## 2021-12-19 DIAGNOSIS — M793 Panniculitis, unspecified: Secondary | ICD-10-CM

## 2021-12-19 DIAGNOSIS — M726 Necrotizing fasciitis: Secondary | ICD-10-CM | POA: Diagnosis not present

## 2021-12-19 DIAGNOSIS — R7989 Other specified abnormal findings of blood chemistry: Secondary | ICD-10-CM

## 2021-12-19 LAB — COMPREHENSIVE METABOLIC PANEL
ALT: 11 U/L (ref 0–35)
AST: 19 U/L (ref 0–37)
Albumin: 2.7 g/dL — ABNORMAL LOW (ref 3.5–5.2)
Alkaline Phosphatase: 118 U/L — ABNORMAL HIGH (ref 39–117)
BUN: 5 mg/dL — ABNORMAL LOW (ref 6–23)
CO2: 27 mEq/L (ref 19–32)
Calcium: 8.3 mg/dL — ABNORMAL LOW (ref 8.4–10.5)
Chloride: 107 mEq/L (ref 96–112)
Creatinine, Ser: 0.99 mg/dL (ref 0.40–1.20)
GFR: 62.26 mL/min (ref >60.00)
Glucose, Bld: 84 mg/dL (ref 70–99)
Potassium: 3.4 mEq/L — ABNORMAL LOW (ref 3.5–5.1)
Sodium: 141 mEq/L (ref 135–145)
Total Bilirubin: 0.6 mg/dL (ref 0.2–1.2)
Total Protein: 6.3 g/dL (ref 6.0–8.3)

## 2021-12-19 LAB — CBC WITH DIFFERENTIAL/PLATELET
Basophils Absolute: 0.1 10*3/uL (ref 0.0–0.1)
Basophils Relative: 0.6 % (ref 0.0–3.0)
Eosinophils Absolute: 0 10*3/uL (ref 0.0–0.7)
Eosinophils Relative: 0.2 % (ref 0.0–5.0)
HCT: 22.5 % — CL (ref 36.0–46.0)
Hemoglobin: 7.6 g/dL — CL (ref 12.0–15.0)
Lymphocytes Relative: 21.1 % (ref 12.0–46.0)
Lymphs Abs: 2.3 10*3/uL (ref 0.7–4.0)
MCHC: 33.7 g/dL (ref 30.0–36.0)
MCV: 94.9 fl (ref 78.0–100.0)
Monocytes Absolute: 1.1 10*3/uL — ABNORMAL HIGH (ref 0.1–1.0)
Monocytes Relative: 9.8 % (ref 3.0–12.0)
Neutro Abs: 7.6 10*3/uL (ref 1.4–7.7)
Neutrophils Relative %: 68.3 % (ref 43.0–77.0)
Platelets: 516 10*3/uL — ABNORMAL HIGH (ref 150.0–400.0)
RBC: 2.37 Mil/uL — ABNORMAL LOW (ref 3.87–5.11)
RDW: 15.8 % — ABNORMAL HIGH (ref 11.5–15.5)
WBC: 11.1 10*3/uL — ABNORMAL HIGH (ref 4.0–10.5)

## 2021-12-19 MED ORDER — HYDROCODONE-ACETAMINOPHEN 5-325 MG PO TABS: 1.0000 | ORAL_TABLET | Freq: Three times a day (TID) | ORAL | 0 refills | Status: DC | PRN

## 2021-12-19 NOTE — Progress Notes (Signed)
Subjective:  Patient ID: Jessica Lynn, female    DOB: [birth year]  Age: 61 y.o. MRN: [id number]  CC: Follow-up [facility] f/u- Pt states she had surgery in July on (R) ankle. (L) ankle has sores on it but it is healing. Just can't get swelling down)   HPI LISETH WANN presents for leg wounds, recent sepsis, vasculitis f/u. Hospital records reviewed.  Per hx: " Admit date:     12/07/2021  Discharge date: 12/13/21  Discharge Physician: Elmarie Shiley    PCP: Cassandria Anger, MD    Recommendations at discharge:     Follow up with Dr Sharol Given further care LE wound Needs CBC to follow WBC, if persist needs hematology evaluation.  Needs follow up with GI for evaluation abnormal CT finding duodenitis ?    Discharge Diagnoses: Principal Problem:   Sepsis with acute organ dysfunction without septic shock ([place]) Active Problems:   AKI (acute kidney injury) ([place])   Essential hypertension   Necrotizing fasciitis (HCC)   Thrombocytopenia (HCC)   Hypokalemia   Obesity (BMI 30-39.9)   Multiple open wounds of right lower extremity   Urinary tract infection without hematuria   Resolved Problems:   * No resolved hospital problems. [facility] Course: 61 year old with past medical history significant for hypertension, necrotizing fasciitis secondary to Staph aureus in the right leg in July 2023 presented to the ED with 4 days history of lethargy, altered mental status.  Patient has been more sleepy for the last 4 days.  Not eating as much or drinking as much.  Patient also had a fever.   Patient presented with tachycardia heart rate 107, temperature 101, white count 27, right tibia-fibula x-ray shows soft tissue ulceration and subcutaneous edema.  No evidence of osteomyelitis.  Chest x-ray negative for pulmonary edema.   Patient admitted with sepsis secondary to E. coli bacteremia.   Assessment and Plan: 1-Sepsis with acute organ dysfunction.  Secondary to UTI.  -Treated with Ancef.  She has been transition to cefadroxil by ID>  -LE infection appears improved. MRI was negative for osteomyelitis.  -MRI LE; Extensive right lower extremity cellulitis with multiple thin crescentic residual collections along the fascial surface of the anterior and lateral leg, which appear to communicate with the skin surface, correlate with drainage. Of note, the anterior collection appears to communicate with the anterior muscular compartment via a fascial defect. Nonspecific intramuscular edema throughout the lower leg, potentially infectious myositis of the anterior and lateral compartments. No evidence of osteomyelitis. -Evaluated by Dr Sharol Given who recommend local care, follow up in the office.   Leukocytosis: persist. ID consulted. Chest x ray no PNA, Blood culture; no growth to date. Renal US negative. CT abdomen pelvis: Inflammatory changes and stranding centered at the third portion of the duodenum likely related to diffuse edema. Duodenitis is less likely but not excluded. Clinical correlation is recommended Patient report diarrhea. GI pathogen negative. Diarrhea resolved.  Plan to discharge on cefadroxil to complete 5 more days, total 10 days.    2-E. coli bacteremia: Leukocytosis persists, plan to check blood cultures. Suspect secondary to UTI. Urine culture initially not send.  Urine culture. No growth to date Renal US negative, transaminases, leukocytosis , will proceed with CT abdomen.    AKI: Received IV fluid Stable, resume lasix.potassium at discharge.    Thrombocytopenia;  Resolved,. In setting of infection.  Haptoglobin normal.  Path review anemia neutrophilia with monocytosis, favor reactive   Bilateral lower extremity wounds:  Evaluated by Dr. Sharol Given who recommended serial wraps of both legs and follow-up in the office to resume compression wraps.  No evidence of current necrotizing fasciitis On antibiotics.    Hypokalemia: Replete  Hypomagnesemia: Resolved Obesity; need  lifestyle modification  ? Duodenitis; needs follow up with GI  Estimated body mass index is 35.07 kg/m as calculated from the following:   Height as of this encounter: '5\' 5"'$  (1.651 m).   Weight as of this encounter: 95.6 kg. Anemia; started ferrous sulfate.            Consultants: ID, Dr Sharol Given Procedures performed:  Disposition: Home Diet recommendation:  Discharge Diet Orders (From admission, onward)        Start     Ordered    12/13/21 0000   Diet - low sodium heart healthy        12/13/21 1338                Cardiac diet DISCHARGE MEDICATION: Allergies as of 12/13/2021         Reactions    Crestor [rosuvastatin] Other (See Comments)    cramps    Oxycodone Itching            Medication List       STOP taking these medications     naproxen 500 MG tablet Commonly known as: NAPROSYN    prednisoLONE acetate 1 % ophthalmic suspension Commonly known as: PRED FORTE    predniSONE 10 MG tablet Commonly known as: DELTASONE           TAKE these medications     cefadroxil 500 MG capsule Commonly known as: DURICEF Take 2 capsules (1,000 mg total) by mouth 2 (two) times daily for 5 days.    dorzolamide-timolol 22.3-6.8 MG/ML ophthalmic solution Commonly known as: COSOPT Place 1 drop into the right eye 2 (two) times daily.    ferrous sulfate 325 (65 FE) MG tablet Take 1 tablet (325 mg total) by mouth daily.    folic acid 1 MG tablet Commonly known as: FOLVITE TAKE 1 TABLET(1 MG) BY MOUTH DAILY What changed: See the new instructions.    furosemide 40 MG tablet Commonly known as: LASIX Take 40 mg by mouth daily.    HYDROcodone-acetaminophen 5-325 MG tablet Commonly known as: NORCO/VICODIN Take 1 tablet by mouth 3 (three) times daily as needed for severe pain.    hydrOXYzine 25 MG tablet Commonly known as: ATARAX TAKE 1 TABLET BY MOUTH EVERY 8 HOURS AS NEEDED FOR ITCHING What changed: See the new instructions.    methocarbamol 500 MG  tablet Commonly known as: ROBAXIN Take 1 tablet (500 mg total) by mouth every 6 (six) hours as needed for muscle spasms.    montelukast 10 MG tablet Commonly known as: SINGULAIR TAKE 1 TABLET(10 MG) BY MOUTH DAILY What changed: See the new instructions.    potassium chloride SA 20 MEQ tablet Commonly known as: KLOR-CON M Take 1 tablet (20 mEq total) by mouth daily.    Santyl 250 UNIT/GM ointment Generic drug: collagenase Apply 1 Application topically daily.    Vitamin D3 50 MCG (2000 UT) capsule Take 1 capsule (2,000 Units total) by mouth daily.    Zinc Sulfate 220 (50 Zn) MG Tabs Take 1 tablet (220 mg total) by mouth daily.    zolpidem 10 MG tablet Commonly known as: AMBIEN Take 1 tablet (10 mg total) by mouth at bedtime as needed for sleep.    "  Outpatient Medications Prior  to Visit  Medication Sig Dispense Refill   Cholecalciferol (VITAMIN D3) 2000 units capsule Take 1 capsule (2,000 Units total) by mouth daily. 100 capsule 3   dorzolamide-timolol (COSOPT) 22.3-6.8 MG/ML ophthalmic solution Place 1 drop into the right eye 2 (two) times daily.     folic acid (FOLVITE) 1 MG tablet TAKE 1 TABLET(1 MG) BY MOUTH DAILY (Patient taking differently: Take 1 mg by mouth daily.) 100 tablet 3   furosemide (LASIX) 40 MG tablet Take 40 mg by mouth daily.     hydrOXYzine (ATARAX/VISTARIL) 25 MG tablet TAKE 1 TABLET BY MOUTH EVERY 8 HOURS AS NEEDED FOR ITCHING (Patient taking differently: Take 25 mg by mouth daily as needed for itching.) 270 tablet 1   methocarbamol (ROBAXIN) 500 MG tablet Take 1 tablet (500 mg total) by mouth every 6 (six) hours as needed for muscle spasms. 20 tablet 0   potassium chloride SA (KLOR-CON M) 20 MEQ tablet Take 1 tablet (20 mEq total) by mouth daily. 30 tablet 0   SANTYL 250 UNIT/GM ointment Apply 1 Application topically daily.     Zinc Sulfate 220 (50 Zn) MG TABS Take 1 tablet (220 mg total) by mouth daily. 30 tablet 0   zolpidem (AMBIEN) 10 MG tablet Take  1 tablet (10 mg total) by mouth at bedtime as needed for sleep. 30 tablet 2   ferrous sulfate 325 (65 FE) MG tablet Take 1 tablet (325 mg total) by mouth daily. 30 tablet 3   HYDROcodone-acetaminophen (NORCO/VICODIN) 5-325 MG tablet Take 1 tablet by mouth 3 (three) times daily as needed for severe pain. 90 tablet 0   montelukast (SINGULAIR) 10 MG tablet TAKE 1 TABLET(10 MG) BY MOUTH DAILY (Patient taking differently: Take 10 mg by mouth daily in the afternoon.) 90 tablet 3   No facility-administered medications prior to visit.    ROS: Review of Systems  Constitutional:  Positive for fatigue. Negative for activity change, appetite change, chills and unexpected weight change.  HENT:  Negative for congestion, mouth sores and sinus pressure.   Eyes:  Negative for visual disturbance.  Respiratory:  Negative for cough and chest tightness.   Gastrointestinal:  Negative for abdominal pain and nausea.  Genitourinary:  Negative for difficulty urinating, frequency and vaginal pain.  Musculoskeletal:  Negative for back pain and gait problem.  Skin:  Positive for color change and wound. Negative for pallor and rash.  Neurological:  Negative for dizziness, tremors, weakness, numbness and headaches.  Psychiatric/Behavioral:  Negative for confusion and sleep disturbance.     Objective:  BP (!) 158/84 (BP Location: Left Arm)   Pulse 87   Temp 98.6 F (37 C) (Oral)   Ht '5\' 5"'$  (1.651 m)   Wt 206 lb 9.6 oz (93.7 kg)   LMP 06/06/2015 Comment: irregular   SpO2 99%   BMI 34.38 kg/m   BP Readings from Last 3 Encounters:  12/19/21 (!) 158/84  12/13/21 (!) 150/98  12/07/21 139/84    Wt Readings from Last 3 Encounters:  12/19/21 206 lb 9.6 oz (93.7 kg)  12/13/21 209 lb 14.1 oz (95.2 kg)  11/14/21 202 lb (91.6 kg)    Physical Exam Constitutional:      General: She is not in acute distress.    Appearance: She is well-developed. She is obese.  HENT:     Head: Normocephalic.     Right Ear:  External ear normal.     Left Ear: External ear normal.     Nose: Nose normal.  Eyes:     General:        Right eye: No discharge.        Left eye: No discharge.     Conjunctiva/sclera: Conjunctivae normal.     Pupils: Pupils are equal, round, and reactive to light.  Neck:     Thyroid: No thyromegaly.     Vascular: No JVD.     Trachea: No tracheal deviation.  Cardiovascular:     Rate and Rhythm: Normal rate and regular rhythm.     Heart sounds: Normal heart sounds.  Pulmonary:     Effort: No respiratory distress.     Breath sounds: No stridor. No wheezing.  Abdominal:     General: Bowel sounds are normal. There is no distension.     Palpations: Abdomen is soft. There is no mass.     Tenderness: There is no abdominal tenderness. There is no guarding or rebound.  Musculoskeletal:        General: Tenderness present.     Cervical back: Normal range of motion and neck supple. No rigidity.  Lymphadenopathy:     Cervical: No cervical adenopathy.  Skin:    Findings: No erythema, lesion or rash.  Neurological:     Mental Status: She is oriented to person, place, and time.     Cranial Nerves: No cranial nerve deficit.     Motor: No abnormal muscle tone.     Coordination: Coordination normal.     Deep Tendon Reflexes: Reflexes normal.  Psychiatric:        Behavior: Behavior normal.        Thought Content: Thought content normal.        Judgment: Judgment normal.   Leg ulcers present bilaterally    A total time of 45 minutes was spent preparing to see the patient, reviewing tests, x-rays, operative reports and other medical records.  Also, obtaining history and performing comprehensive physical exam.  Additionally, counseling the patient regarding the above listed issues.   Finally, documenting clinical information in the health records, coordination of care, educating the patient -leg wounds, panniculitis, vasculitis. It is a complex case.   Lab Results  Component Value Date    WBC 11.1 (H) 12/19/2021   HGB 7.6 Repeated and verified X2. (LL) 12/19/2021   HCT 22.5 Repeated and verified X2. (LL) 12/19/2021   PLT 516.0 (H) 12/19/2021   GLUCOSE 84 12/19/2021   CHOL 148 12/16/2019   TRIG 71 12/16/2019   HDL 70 12/16/2019   LDLDIRECT 160.7 02/06/2012   LDLCALC 63 12/16/2019   ALT 11 12/19/2021   AST 19 12/19/2021   NA 141 12/19/2021   K 3.4 (L) 12/19/2021   CL 107 12/19/2021   CREATININE 0.99 12/19/2021   BUN 5 (L) 12/19/2021   CO2 27 12/19/2021   TSH 1.65 09/16/2021   INR 1.4 (H) 12/08/2021   HGBA1C 5.5 09/16/2021    DG Tibia/Fibula Right  Result Date: 12/08/2021 CLINICAL DATA:  Preprocedure.  Rule out metal in leg prior to MRI. EXAM: RIGHT TIBIA AND FIBULA - 2 VIEW COMPARISON:  Radiograph yesterday. Right lower extremity MRI 10/29/2021 FINDINGS: Stable skin irregularity about the lateral aspect of the lower leg. No radiopaque or metallic foreign body. There is no tracking soft tissue gas. Generalized subcutaneous edema. No fracture, periosteal reaction or bone destruction. Overlying dressing in place. IMPRESSION: Stable radiograph from yesterday. Stable skin irregularity about the lateral aspect of the lower leg. No radiopaque or metallic foreign body. Generalized subcutaneous edema. Electronically  Signed   By: Keith Rake M.D.   On: 12/08/2021 17:10   MR LUMBAR SPINE WO CONTRAST  Result Date: 12/08/2021 CLINICAL DATA:  Initial evaluation for low back pain, infection suspected. EXAM: MRI LUMBAR SPINE WITHOUT CONTRAST TECHNIQUE: Multiplanar, multisequence MR imaging of the lumbar spine was performed. No intravenous contrast was administered. COMPARISON:  Prior MRI from 08/31/2014. FINDINGS: Segmentation: Examination technically limited as the patient was unable to tolerate the full length of the exam. Sagittal sequences only were obtained. No axial images available for review. Standard segmentation. Lowest well-formed disc space labeled the L5-S1 level.  Alignment: 7 mm anterolisthesis of L4 on L5, likely chronic and facet mediated. Trace degenerative retrolisthesis of L1 on L2 and L2 on L3. Underlying mild scoliosis. Vertebrae: Vertebral body height maintained without acute or chronic fracture. Bone marrow signal intensity within normal limits. No discrete or worrisome osseous lesions. Mild reactive edema present about the L2-3 and L3-4 facets, favored to be degenerative in nature and due to facet arthritis. No other evidence for osteomyelitis discitis or other infection within the lumbar spine. Conus medullaris and cauda equina: Conus extends to the L1-2 level. Conus and cauda equina appear normal. Paraspinal and other soft tissues: Diffuse edema seen throughout the subcutaneous fat of the lower back, nonspecific. No visible loculated collections. Fibroid uterus noted. Disc levels: T12-L1: Mild disc bulge with endplate spurring. No significant stenosis. L1-2: Mild disc bulge with endplate spurring. Bilateral facet hypertrophy. No significant spinal stenosis. Foramina appear grossly patent. L2-3: Degenerative intervertebral disc space narrowing with diffuse disc bulge and disc desiccation. Reactive endplate spurring. No significant spinal stenosis seen on this limited exam. Mild-to-moderate bilateral foraminal narrowing. L3-4: Degenerative intervertebral disc space narrowing with mild diffuse disc bulge and disc desiccation. Reactive endplate spurring. Moderate facet hypertrophy. No significant spinal stenosis on this limited exam. Mild-to-moderate bilateral facet hypertrophy. L4-5: Anterolisthesis. Disc desiccation with broad posterior pseudo disc bulge/uncovering. Moderate bilateral facet arthrosis. No significant spinal stenosis on this limited exam. Mild to moderate bilateral foraminal narrowing. L5-S1: No significant disc bulge. Mild facet hypertrophy. No significant stenosis. IMPRESSION: 1. Technically limited study due to the patient's inability to tolerate  the full length of the exam. Sagittal sequences only were obtained. 2. Diffuse edema throughout the subcutaneous fat of the lower back. Finding is nonspecific, and could be related to overall volume status. Regional cellulitis could also have this appearance. Correlation with physical exam recommended. No loculated collections. 3. No other evidence for acute infection within the lumbar spine. 4. Underlying moderate multilevel degenerative spondylosis and facet arthrosis as above. No significant spinal stenosis evident on this limited exam. Mild-to-moderate bilateral foraminal narrowing at L2-3 through L4-5. Electronically Signed   By: Jeannine Boga M.D.   On: 12/08/2021 01:18   DG Tibia/Fibula Right  Result Date: 12/07/2021 CLINICAL DATA:  Chronic right lower extremity wound EXAM: RIGHT TIBIA AND FIBULA - 2 VIEW COMPARISON:  None Available. FINDINGS: Normal alignment. No acute fracture or dislocation. No osseous erosions or abnormal periosteal reaction. There is diffuse subcutaneous edema involving the right lower extremity. Shallow ulcer noted along the medial aspect of the right lower extremity at the level of the mid diaphysis of the tibia and fibula. IMPRESSION: Soft tissue ulcer and diffuse subcutaneous edema. No radiographic evidence of osteomyelitis. Electronically Signed   By: Fidela Salisbury M.D.   On: 12/07/2021 20:05   DG Chest 2 View  Result Date: 12/07/2021 CLINICAL DATA:  Possible sepsis EXAM: CHEST - 2 VIEW COMPARISON:  10/28/2021  FINDINGS: Transverse diameter of heart is increased. This may be partly due to poor inspiration. There are no signs of alveolar pulmonary edema. Linear densities are seen in medial left lower lung field. There is no focal pulmonary consolidation. There is no significant pleural effusion or pneumothorax. IMPRESSION: New linear densities in the medial left lower lung fields suggest subsegmental atelectasis. There are no signs of pulmonary edema or focal pulmonary  consolidation. Electronically Signed   By: Elmer Picker M.D.   On: 12/07/2021 18:19    Assessment & Plan:   Problem List Items Addressed This Visit     Abnormal CBC    Repeat CBC      Anemia   Necrotizing fasciitis ([place])    Improving  Seeing Dr Sharol Given      Primary osteoarthritis involving multiple joints   Relevant Medications   HYDROcodone-acetaminophen (NORCO/VICODIN) 5-325 MG tablet   Sclerosing panniculitis    Seeing Dr Sharol Given      Relevant Orders   Comprehensive metabolic panel (Completed)   CBC with Differential/Platelet (Completed)   Vasculitis of skin    Norco prn  Potential benefits of a long term opioids use as well as potential risks (i.e. addiction risk, apnea etc) and complications (i.e. Somnolence, constipation and others) were explained to the patient and were aknowledged.          Meds ordered this encounter  Medications   HYDROcodone-acetaminophen (NORCO/VICODIN) 5-325 MG tablet    Sig: Take 1 tablet by mouth 3 (three) times daily as needed for severe pain.    Dispense:  90 tablet    Refill:  0    Please fill on or after 12/25/21.  Office visit every 3 months      Follow-up: Return in about 6 weeks (around 01/30/2022) for a follow-up visit.  Walker Kehr, MD

## 2021-12-19 NOTE — Assessment & Plan Note (Signed)
Repeat CBC

## 2021-12-19 NOTE — Assessment & Plan Note (Signed)
Improving  Seeing Dr Sharol Given

## 2021-12-19 NOTE — Assessment & Plan Note (Signed)
Seeing Dr Sharol Given

## 2021-12-19 NOTE — Assessment & Plan Note (Signed)
Norco prn  Potential benefits of a long term opioids use as well as potential risks (i.e. addiction risk, apnea etc) and complications (i.e. Somnolence, constipation and others) were explained to the patient and were aknowledged. 

## 2021-12-20 LAB — IRON,TIBC AND FERRITIN PANEL
%SAT: 23 % (calc) (ref 16–45)
Ferritin: 363 ng/mL — ABNORMAL HIGH (ref 16–232)
Iron: 55 ug/dL (ref 45–160)
TIBC: 236 mcg/dL (calc) — ABNORMAL LOW (ref 250–450)

## 2021-12-20 MED ORDER — FERROUS SULFATE 325 (65 FE) MG PO TABS: 325.0000 mg | ORAL_TABLET | Freq: Every day | ORAL | 5 refills | Status: DC

## 2021-12-20 NOTE — Telephone Encounter (Signed)
Called pt no answer & can't 't leave msg due to vm is full. Will retry later.Marland KitchenJohny Lynn

## 2021-12-20 NOTE — Telephone Encounter (Signed)
Notified pt w/MD response.. Pt states she is taking iron every day.Marland KitchenJohny Chess

## 2021-12-20 NOTE — Telephone Encounter (Signed)
Noted.  Take iron sulfate 325 mg twice a day.  Keep return office visit  thanks

## 2021-12-24 ENCOUNTER — Telehealth: Payer: Self-pay | Admitting: Orthopedic Surgery

## 2021-12-24 NOTE — Telephone Encounter (Signed)
Jessica Lynn  vincent form [facility]  would like someone to call her about [name] care for her legs. The best contact number [phone]

## 2021-12-24 NOTE — Telephone Encounter (Signed)
Called and [facility] Lisa to advise that the pt is coming into the office once a week for compression dressings and that she also has a lymphedema pump at home that she should be using daily. Nurse states that they do not do compression dressings but if anything in care for the pt's wounds should change to call and let them know. She is going out to se the pt on Thursday for medication management and will encourage pump use as well. Will call with any other questions.

## 2021-12-25 ENCOUNTER — Ambulatory Visit (INDEPENDENT_AMBULATORY_CARE_PROVIDER_SITE_OTHER): Payer: BC Managed Care – PPO | Admitting: Family

## 2021-12-25 ENCOUNTER — Encounter: Payer: Self-pay | Admitting: Family

## 2021-12-25 ENCOUNTER — Telehealth: Payer: Self-pay | Admitting: Internal Medicine

## 2021-12-25 DIAGNOSIS — Z9889 Other specified postprocedural states: Secondary | ICD-10-CM

## 2021-12-25 DIAGNOSIS — I87333 Chronic venous hypertension (idiopathic) with ulcer and inflammation of bilateral lower extremity: Secondary | ICD-10-CM | POA: Diagnosis not present

## 2021-12-25 NOTE — Telephone Encounter (Signed)
Faxed med list to [phone]../l;mb
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
## 2021-12-25 NOTE — Telephone Encounter (Signed)
Jessica Lynn from [facility] called to ask for an updated med list for patient so they can inform her on them during visits. She would like it faxed to her attention to [phone].       Please advise

## 2021-12-26 ENCOUNTER — Telehealth: Payer: Self-pay | Admitting: *Deleted

## 2021-12-26 NOTE — Telephone Encounter (Signed)
Pt call back she stated rep thought Dr. Alain Marion did surgery. No need to fill out form everything is done by Dr. Sharol Given...lmb

## 2021-12-26 NOTE — Telephone Encounter (Signed)
MD received fax from Disability Claims. He is wondering what its for or need dates of disability. Called pt inform her MD received disability form from Mr. Sander Radon. MD not really sure what its for. She states Dr. Sharol Given had filled out form when she had surgery on her leg. She is going to call Jessica Lynn to see what this form is abt and give [place] a call back...Jessica Lynn

## 2021-12-27 ENCOUNTER — Other Ambulatory Visit: Payer: Self-pay | Admitting: Internal Medicine

## 2021-12-30 ENCOUNTER — Telehealth: Payer: Self-pay | Admitting: Family

## 2021-12-30 NOTE — Telephone Encounter (Signed)
Please advise 

## 2021-12-30 NOTE — Telephone Encounter (Signed)
Fabio Bering called from [employer] about pt's short term disability. Adriana asking for a return call with pt's restrictions, limitations, and estimated return to work date. Please call Adriana at [phone].

## 2021-12-31 NOTE — Telephone Encounter (Signed)
Can we do this tomorrow with her appt?

## 2021-12-31 NOTE — Telephone Encounter (Signed)
Can you remind Jessica Lynn of this tomorrow morning? I am not working with her in the morning.

## 2021-12-31 NOTE — Telephone Encounter (Signed)
Yes, we can do this at her appt tomorrow. Will hold message.

## 2022-01-01 ENCOUNTER — Ambulatory Visit (INDEPENDENT_AMBULATORY_CARE_PROVIDER_SITE_OTHER): Payer: BC Managed Care – PPO | Admitting: Family

## 2022-01-01 DIAGNOSIS — Z9889 Other specified postprocedural states: Secondary | ICD-10-CM

## 2022-01-01 DIAGNOSIS — I87333 Chronic venous hypertension (idiopathic) with ulcer and inflammation of bilateral lower extremity: Secondary | ICD-10-CM

## 2022-01-01 DIAGNOSIS — I890 Lymphedema, not elsewhere classified: Secondary | ICD-10-CM

## 2022-01-02 NOTE — Telephone Encounter (Signed)
I called and sw "Jessica Lynn" advised that the pt will be out of work for another 4 weeks and that once the visit note from Wednesday has been dictated I will fax to the office. Fax # is [phone] claim ID # [id number]

## 2022-01-02 NOTE — Telephone Encounter (Signed)
Adrianna called back from [employer] asking for a call back in regards to patients return to work date, restrictions, etc.. CB # [phone]

## 2022-01-03 ENCOUNTER — Encounter: Payer: Self-pay | Admitting: Family

## 2022-01-03 NOTE — Telephone Encounter (Signed)
Updated note with time out of work faxed to number below with case ID number as requested.

## 2022-01-03 NOTE — Telephone Encounter (Signed)
Faxed lastest note to [employer]
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
## 2022-01-03 NOTE — Telephone Encounter (Signed)
Dictate out of work 4 weeks in office note.

## 2022-01-15 ENCOUNTER — Ambulatory Visit: Payer: BC Managed Care – PPO | Admitting: Internal Medicine

## 2022-01-15 ENCOUNTER — Ambulatory Visit (INDEPENDENT_AMBULATORY_CARE_PROVIDER_SITE_OTHER): Payer: BC Managed Care – PPO | Admitting: Family

## 2022-01-15 DIAGNOSIS — M5416 Radiculopathy, lumbar region: Secondary | ICD-10-CM

## 2022-01-15 DIAGNOSIS — Z9889 Other specified postprocedural states: Secondary | ICD-10-CM

## 2022-01-15 DIAGNOSIS — I890 Lymphedema, not elsewhere classified: Secondary | ICD-10-CM
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
## 2022-01-16 ENCOUNTER — Other Ambulatory Visit: Payer: Self-pay | Admitting: Internal Medicine

## 2022-01-16 DIAGNOSIS — Z1231 Encounter for screening mammogram for malignant neoplasm of breast: Secondary | ICD-10-CM

## 2022-01-24 ENCOUNTER — Telehealth: Payer: Self-pay | Admitting: Internal Medicine

## 2022-01-24 DIAGNOSIS — M150 Primary generalized (osteo)arthritis: Secondary | ICD-10-CM

## 2022-01-24 DIAGNOSIS — M159 Polyosteoarthritis, unspecified: Secondary | ICD-10-CM

## 2022-01-24 MED ORDER — HYDROCODONE-ACETAMINOPHEN 5-325 MG PO TABS: 1.0000 | ORAL_TABLET | Freq: Three times a day (TID) | ORAL | 0 refills | Status: DC | PRN

## 2022-01-24 NOTE — Telephone Encounter (Signed)
Okay.  Done.  Thanks 

## 2022-01-24 NOTE — Telephone Encounter (Signed)
Patient needs her hydrocodone refilled - please send to [facility] on [address] -  Next visit 02/05/2022

## 2022-01-31 ENCOUNTER — Other Ambulatory Visit: Payer: Self-pay

## 2022-01-31 ENCOUNTER — Telehealth: Payer: Self-pay | Admitting: Orthopedic Surgery

## 2022-01-31 NOTE — Telephone Encounter (Signed)
Pt called in stating that her job assumed that she was returning back to work today... Pt stated that her is requesting a note for pt to be out until next follow up date... Pt requesting callback as soon as possible.Jessica KitchenMarland Lynn

## 2022-01-31 NOTE — Telephone Encounter (Signed)
Pt states she needs a note to return to work per her employer and she has not been told to return to work. Please call an confirm. [phone]

## 2022-01-31 NOTE — Telephone Encounter (Signed)
I called pt and letter written fo rher to remain out of work as stated in last dictation from [place] on 01/15/2022 until her f/u appt on 02/12/2022. This was faxed to [phone] ATT: [employer]  per pt request. This was faxed today and they will call with any questions or concerns.

## 2022-02-05 ENCOUNTER — Ambulatory Visit: Payer: BC Managed Care – PPO | Admitting: Internal Medicine

## 2022-02-05 ENCOUNTER — Encounter: Payer: Self-pay | Admitting: Internal Medicine

## 2022-02-05 VITALS — BP 142/90 | HR 90 | Temp 97.9°F | Ht 65.0 in | Wt 169.0 lb

## 2022-02-05 DIAGNOSIS — M79606 Pain in leg, unspecified: Secondary | ICD-10-CM

## 2022-02-05 DIAGNOSIS — D509 Iron deficiency anemia, unspecified: Secondary | ICD-10-CM | POA: Diagnosis not present

## 2022-02-05 DIAGNOSIS — H35063 Retinal vasculitis, bilateral: Secondary | ICD-10-CM | POA: Diagnosis not present

## 2022-02-05 DIAGNOSIS — M793 Panniculitis, unspecified: Secondary | ICD-10-CM | POA: Diagnosis not present

## 2022-02-05 DIAGNOSIS — I10 Essential (primary) hypertension: Secondary | ICD-10-CM

## 2022-02-05 DIAGNOSIS — M545 Low back pain, unspecified: Secondary | ICD-10-CM

## 2022-02-05 DIAGNOSIS — M726 Necrotizing fasciitis: Secondary | ICD-10-CM

## 2022-02-05 DIAGNOSIS — F419 Anxiety disorder, unspecified: Secondary | ICD-10-CM

## 2022-02-05 DIAGNOSIS — G4700 Insomnia, unspecified: Secondary | ICD-10-CM

## 2022-02-05 LAB — CBC WITH DIFFERENTIAL/PLATELET
Basophils Absolute: 0 10*3/uL (ref 0.0–0.1)
Basophils Relative: 0.6 % (ref 0.0–3.0)
Eosinophils Absolute: 0.1 10*3/uL (ref 0.0–0.7)
Eosinophils Relative: 1.5 % (ref 0.0–5.0)
HCT: 31.8 % — ABNORMAL LOW (ref 36.0–46.0)
Hemoglobin: 10.5 g/dL — ABNORMAL LOW (ref 12.0–15.0)
Lymphocytes Relative: 39.4 % (ref 12.0–46.0)
Lymphs Abs: 2.5 10*3/uL (ref 0.7–4.0)
MCHC: 33.1 g/dL (ref 30.0–36.0)
MCV: 92.5 fl (ref 78.0–100.0)
Monocytes Absolute: 0.8 10*3/uL (ref 0.1–1.0)
Monocytes Relative: 12.6 % — ABNORMAL HIGH (ref 3.0–12.0)
Neutro Abs: 2.9 10*3/uL (ref 1.4–7.7)
Neutrophils Relative %: 45.9 % (ref 43.0–77.0)
Platelets: 283 10*3/uL (ref 150.0–400.0)
RBC: 3.44 Mil/uL — ABNORMAL LOW (ref 3.87–5.11)
RDW: 13.8 % (ref 11.5–15.5)
WBC: 6.4 10*3/uL (ref 4.0–10.5)

## 2022-02-05 LAB — COMPREHENSIVE METABOLIC PANEL
ALT: 7 U/L (ref 0–35)
AST: 19 U/L (ref 0–37)
Albumin: 3.8 g/dL (ref 3.5–5.2)
Alkaline Phosphatase: 55 U/L (ref 39–117)
BUN: 28 mg/dL — ABNORMAL HIGH (ref 6–23)
CO2: 29 mEq/L (ref 19–32)
Calcium: 9.9 mg/dL (ref 8.4–10.5)
Chloride: 98 mEq/L (ref 96–112)
Creatinine, Ser: 1.4 mg/dL — ABNORMAL HIGH (ref 0.40–1.20)
GFR: 41.04 mL/min — ABNORMAL LOW (ref >60.00)
Glucose, Bld: 85 mg/dL (ref 70–99)
Potassium: 3.9 mEq/L (ref 3.5–5.1)
Sodium: 135 mEq/L (ref 135–145)
Total Bilirubin: 0.5 mg/dL (ref 0.2–1.2)
Total Protein: 7.7 g/dL (ref 6.0–8.3)

## 2022-02-05 NOTE — Assessment & Plan Note (Deleted)
Cont w/Clonazepam prn  Potential benefits of a long term benzodiazepines  use as well as potential risks  and complications were explained to the patient and were aknowledged.

## 2022-02-05 NOTE — Assessment & Plan Note (Signed)
Cont w/Zolpidem prn  Potential benefits of a long term benzodiazepines  use as well as potential risks  and complications were explained to the patient and were aknowledged.

## 2022-02-05 NOTE — Progress Notes (Signed)
Subjective:  Patient ID: Jessica Lynn, female    DOB: 04/10/1960  Age: 61 y.o. MRN: [id number]  CC: Follow-up (6 week f/u)   HPI Jessica Lynn presents for leg wounds - healing, LBP, anxiety, anemia S/p necrotizing fasciitis  Outpatient Medications Prior to Visit  Medication Sig Dispense Refill   Cholecalciferol (VITAMIN D3) 2000 units capsule Take 1 capsule (2,000 Units total) by mouth daily. 100 capsule 3   dorzolamide-timolol (COSOPT) 22.3-6.8 MG/ML ophthalmic solution Place 1 drop into the right eye 2 (two) times daily.     ferrous sulfate 325 (65 FE) MG tablet Take 1 tablet (325 mg total) by mouth daily. 30 tablet 5   folic acid (FOLVITE) 1 MG tablet TAKE 1 TABLET(1 MG) BY MOUTH DAILY (Patient taking differently: Take 1 mg by mouth daily.) 100 tablet 3   furosemide (LASIX) 40 MG tablet Take 40 mg by mouth daily.     HYDROcodone-acetaminophen (NORCO/VICODIN) 5-325 MG tablet Take 1 tablet by mouth 3 (three) times daily as needed for severe pain. 90 tablet 0   hydrOXYzine (ATARAX/VISTARIL) 25 MG tablet TAKE 1 TABLET BY MOUTH EVERY 8 HOURS AS NEEDED FOR ITCHING (Patient taking differently: Take 25 mg by mouth daily as needed for itching.) 270 tablet 1   methocarbamol (ROBAXIN) 500 MG tablet Take 1 tablet (500 mg total) by mouth every 6 (six) hours as needed for muscle spasms. 20 tablet 0   montelukast (SINGULAIR) 10 MG tablet TAKE 1 TABLET(10 MG) BY MOUTH DAILY 90 tablet 3   potassium chloride SA (KLOR-CON M) 20 MEQ tablet Take 1 tablet (20 mEq total) by mouth daily. 30 tablet 0   SANTYL 250 UNIT/GM ointment Apply 1 Application topically daily.     Zinc Sulfate 220 (50 Zn) MG TABS Take 1 tablet (220 mg total) by mouth daily. 30 tablet 0   zolpidem (AMBIEN) 10 MG tablet Take 1 tablet (10 mg total) by mouth at bedtime as needed for sleep. 30 tablet 2   No facility-administered medications prior to visit.    ROS: Review of Systems  Constitutional:  Positive for fatigue. Negative for  activity change, appetite change, chills, fever and unexpected weight change.  HENT:  Negative for congestion, mouth sores and sinus pressure.   Eyes:  Negative for visual disturbance.  Respiratory:  Negative for cough and chest tightness.   Gastrointestinal:  Negative for abdominal pain and nausea.  Genitourinary:  Negative for difficulty urinating, frequency and vaginal pain.  Musculoskeletal:  Positive for back pain and gait problem.  Skin:  Negative for pallor and rash.  Neurological:  Negative for dizziness, tremors, weakness, numbness and headaches.  Psychiatric/Behavioral:  Negative for confusion, sleep disturbance and suicidal ideas. The patient is nervous/anxious.     Objective:  BP (!) 142/90 (BP Location: Left Arm)   Pulse 90   Temp 97.9 F (36.6 C) (Oral)   Ht '5\' 5"'$  (1.651 m)   Wt 169 lb (76.7 kg)   LMP 06/06/2015 Comment: irregular   SpO2 96%   BMI 28.12 kg/m   BP Readings from Last 3 Encounters:  02/05/22 (!) 142/90  12/19/21 (!) 158/84  12/13/21 (!) 150/98    Wt Readings from Last 3 Encounters:  02/05/22 169 lb (76.7 kg)  12/19/21 206 lb 9.6 oz (93.7 kg)  12/13/21 209 lb 14.1 oz (95.2 kg)    Physical Exam Constitutional:      General: She is not in acute distress.    Appearance: She is well-developed. She  is obese.  HENT:     Head: Normocephalic.     Right Ear: External ear normal.     Left Ear: External ear normal.     Nose: Nose normal.  Eyes:     General:        Right eye: No discharge.        Left eye: No discharge.     Conjunctiva/sclera: Conjunctivae normal.     Pupils: Pupils are equal, round, and reactive to light.  Neck:     Thyroid: No thyromegaly.     Vascular: No JVD.     Trachea: No tracheal deviation.  Cardiovascular:     Rate and Rhythm: Normal rate and regular rhythm.     Heart sounds: Normal heart sounds.  Pulmonary:     Effort: No respiratory distress.     Breath sounds: No stridor. No wheezing.  Abdominal:     General:  Bowel sounds are normal. There is no distension.     Palpations: Abdomen is soft. There is no mass.     Tenderness: There is no abdominal tenderness. There is no guarding or rebound.  Musculoskeletal:        General: No tenderness.     Cervical back: Normal range of motion and neck supple. No rigidity.  Lymphadenopathy:     Cervical: No cervical adenopathy.  Skin:    Findings: No erythema or rash.  Neurological:     Cranial Nerves: No cranial nerve deficit.     Motor: No abnormal muscle tone.     Coordination: Coordination normal.     Gait: Gait abnormal.     Deep Tendon Reflexes: Reflexes normal.  Psychiatric:        Behavior: Behavior normal.        Thought Content: Thought content normal.        Judgment: Judgment normal.    LE wounds - healed No edema   Lab Results  Component Value Date   WBC 11.1 (H) 12/19/2021   HGB 7.6 Repeated and verified X2. (LL) 12/19/2021   HCT 22.5 Repeated and verified X2. (LL) 12/19/2021   PLT 516.0 (H) 12/19/2021   GLUCOSE 84 12/19/2021   CHOL 148 12/16/2019   TRIG 71 12/16/2019   HDL 70 12/16/2019   LDLDIRECT 160.7 02/06/2012   LDLCALC 63 12/16/2019   ALT 11 12/19/2021   AST 19 12/19/2021   NA 141 12/19/2021   K 3.4 (L) 12/19/2021   CL 107 12/19/2021   CREATININE 0.99 12/19/2021   BUN 5 (L) 12/19/2021   CO2 27 12/19/2021   TSH 1.65 09/16/2021   INR 1.4 (H) 12/08/2021   HGBA1C 5.5 09/16/2021    DG Tibia/Fibula Right  Result Date: 12/08/2021 CLINICAL DATA:  Preprocedure.  Rule out metal in leg prior to MRI. EXAM: RIGHT TIBIA AND FIBULA - 2 VIEW COMPARISON:  Radiograph yesterday. Right lower extremity MRI 10/29/2021 FINDINGS: Stable skin irregularity about the lateral aspect of the lower leg. No radiopaque or metallic foreign body. There is no tracking soft tissue gas. Generalized subcutaneous edema. No fracture, periosteal reaction or bone destruction. Overlying dressing in place. IMPRESSION: Stable radiograph from yesterday. Stable  skin irregularity about the lateral aspect of the lower leg. No radiopaque or metallic foreign body. Generalized subcutaneous edema. Electronically Signed   By: Keith Rake M.D.   On: 12/08/2021 17:10   MR LUMBAR SPINE WO CONTRAST  Result Date: 12/08/2021 CLINICAL DATA:  Initial evaluation for low back pain, infection suspected. EXAM: MRI  LUMBAR SPINE WITHOUT CONTRAST TECHNIQUE: Multiplanar, multisequence MR imaging of the lumbar spine was performed. No intravenous contrast was administered. COMPARISON:  Prior MRI from 08/31/2014. FINDINGS: Segmentation: Examination technically limited as the patient was unable to tolerate the full length of the exam. Sagittal sequences only were obtained. No axial images available for review. Standard segmentation. Lowest well-formed disc space labeled the L5-S1 level. Alignment: 7 mm anterolisthesis of L4 on L5, likely chronic and facet mediated. Trace degenerative retrolisthesis of L1 on L2 and L2 on L3. Underlying mild scoliosis. Vertebrae: Vertebral body height maintained without acute or chronic fracture. Bone marrow signal intensity within normal limits. No discrete or worrisome osseous lesions. Mild reactive edema present about the L2-3 and L3-4 facets, favored to be degenerative in nature and due to facet arthritis. No other evidence for osteomyelitis discitis or other infection within the lumbar spine. Conus medullaris and cauda equina: Conus extends to the L1-2 level. Conus and cauda equina appear normal. Paraspinal and other soft tissues: Diffuse edema seen throughout the subcutaneous fat of the lower back, nonspecific. No visible loculated collections. Fibroid uterus noted. Disc levels: T12-L1: Mild disc bulge with endplate spurring. No significant stenosis. L1-2: Mild disc bulge with endplate spurring. Bilateral facet hypertrophy. No significant spinal stenosis. Foramina appear grossly patent. L2-3: Degenerative intervertebral disc space narrowing with diffuse  disc bulge and disc desiccation. Reactive endplate spurring. No significant spinal stenosis seen on this limited exam. Mild-to-moderate bilateral foraminal narrowing. L3-4: Degenerative intervertebral disc space narrowing with mild diffuse disc bulge and disc desiccation. Reactive endplate spurring. Moderate facet hypertrophy. No significant spinal stenosis on this limited exam. Mild-to-moderate bilateral facet hypertrophy. L4-5: Anterolisthesis. Disc desiccation with broad posterior pseudo disc bulge/uncovering. Moderate bilateral facet arthrosis. No significant spinal stenosis on this limited exam. Mild to moderate bilateral foraminal narrowing. L5-S1: No significant disc bulge. Mild facet hypertrophy. No significant stenosis. IMPRESSION: 1. Technically limited study due to the patient's inability to tolerate the full length of the exam. Sagittal sequences only were obtained. 2. Diffuse edema throughout the subcutaneous fat of the lower back. Finding is nonspecific, and could be related to overall volume status. Regional cellulitis could also have this appearance. Correlation with physical exam recommended. No loculated collections. 3. No other evidence for acute infection within the lumbar spine. 4. Underlying moderate multilevel degenerative spondylosis and facet arthrosis as above. No significant spinal stenosis evident on this limited exam. Mild-to-moderate bilateral foraminal narrowing at L2-3 through L4-5. Electronically Signed   By: Jeannine Boga M.D.   On: 12/08/2021 01:18   DG Tibia/Fibula Right  Result Date: 12/07/2021 CLINICAL DATA:  Chronic right lower extremity wound EXAM: RIGHT TIBIA AND FIBULA - 2 VIEW COMPARISON:  None Available. FINDINGS: Normal alignment. No acute fracture or dislocation. No osseous erosions or abnormal periosteal reaction. There is diffuse subcutaneous edema involving the right lower extremity. Shallow ulcer noted along the medial aspect of the right lower extremity at  the level of the mid diaphysis of the tibia and fibula. IMPRESSION: Soft tissue ulcer and diffuse subcutaneous edema. No radiographic evidence of osteomyelitis. Electronically Signed   By: Fidela Salisbury M.D.   On: 12/07/2021 20:05   DG Chest 2 View  Result Date: 12/07/2021 CLINICAL DATA:  Possible sepsis EXAM: CHEST - 2 VIEW COMPARISON:  10/28/2021 FINDINGS: Transverse diameter of heart is increased. This may be partly due to poor inspiration. There are no signs of alveolar pulmonary edema. Linear densities are seen in medial left lower lung field. There is no focal  pulmonary consolidation. There is no significant pleural effusion or pneumothorax. IMPRESSION: New linear densities in the medial left lower lung fields suggest subsegmental atelectasis. There are no signs of pulmonary edema or focal pulmonary consolidation. Electronically Signed   By: Elmer Picker M.D.   On: 12/07/2021 18:19    Assessment & Plan:   Problem List Items Addressed This Visit     Anemia   Relevant Orders   CBC with Differential/Platelet   Iron, TIBC and Ferritin Panel   Comprehensive metabolic panel   Anxiety disorder   Essential hypertension - Primary    On Furosemide, KCl      Relevant Orders   Comprehensive metabolic panel   Insomnia    Cont w/Zolpidem prn  Potential benefits of a long term benzodiazepines  use as well as potential risks  and complications were explained to the patient and were aknowledged.      Low back pain radiating down leg    Norco prn  Potential benefits of a long term opioids use as well as potential risks (i.e. addiction risk, apnea etc) and complications (i.e. Somnolence, constipation and others) were explained to the patient and were aknowledged.       Necrotizing fasciitis ([place])    F/u w/ Dr Sharol Given Healing and healed wounds      Relevant Orders   CBC with Differential/Platelet   Retinal vasculitis    F/u w/Ophthalmology      Sclerosing panniculitis    Wounds  healed         No orders of the defined types were placed in this encounter.     Follow-up: Return in about 2 months (around 04/07/2022) for a follow-up visit.  Walker Kehr, MD

## 2022-02-05 NOTE — Assessment & Plan Note (Signed)
Norco prn  Potential benefits of a long term opioids use as well as potential risks (i.e. addiction risk, apnea etc) and complications (i.e. Somnolence, constipation and others) were explained to the patient and were aknowledged. 

## 2022-02-05 NOTE — Assessment & Plan Note (Signed)
F/u w/Ophthalmology

## 2022-02-05 NOTE — Assessment & Plan Note (Addendum)
On Furosemide, KCl

## 2022-02-05 NOTE — Assessment & Plan Note (Signed)
Wounds healed

## 2022-02-05 NOTE — Assessment & Plan Note (Signed)
F/u w/ Dr Sharol Given Healing and healed wounds

## 2022-02-06 LAB — IRON,TIBC AND FERRITIN PANEL
%SAT: 36 % (calc) (ref 16–45)
Ferritin: 191 ng/mL (ref 16–232)
Iron: 123 ug/dL (ref 45–160)
TIBC: 337 mcg/dL (calc) (ref 250–450)

## 2022-02-12 ENCOUNTER — Ambulatory Visit (INDEPENDENT_AMBULATORY_CARE_PROVIDER_SITE_OTHER): Payer: BC Managed Care – PPO | Admitting: Family

## 2022-02-12 DIAGNOSIS — I87333 Chronic venous hypertension (idiopathic) with ulcer and inflammation of bilateral lower extremity: Secondary | ICD-10-CM

## 2022-02-12 DIAGNOSIS — R202 Paresthesia of skin: Secondary | ICD-10-CM | POA: Diagnosis not present

## 2022-02-12 DIAGNOSIS — Z9889 Other specified postprocedural states: Secondary | ICD-10-CM

## 2022-02-12 DIAGNOSIS — R200 Anesthesia of skin: Secondary | ICD-10-CM | POA: Diagnosis not present
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
## 2022-02-20 ENCOUNTER — Telehealth: Payer: Self-pay | Admitting: Internal Medicine

## 2022-02-20 DIAGNOSIS — M159 Polyosteoarthritis, unspecified: Secondary | ICD-10-CM

## 2022-02-20 NOTE — Telephone Encounter (Signed)
Caller & Relationship to patient: Jessica Lynn   Call back number: [phone]   Date of last office visit:02/05/22   Date of next office visit:04/08/22   Medication(s) to be refilled:HYDROcodone-acetaminophen (NORCO/VICODIN) 5-325 MG tablet         Preferred Pharmacy: [facility] at [address]

## 2022-02-21 MED ORDER — HYDROCODONE-ACETAMINOPHEN 5-325 MG PO TABS: 1.0000 | ORAL_TABLET | Freq: Three times a day (TID) | ORAL | 0 refills | Status: DC | PRN

## 2022-02-21 NOTE — Telephone Encounter (Signed)
Okay. Thank you.

## 2022-02-24 ENCOUNTER — Ambulatory Visit: Payer: BC Managed Care – PPO

## 2022-03-14 ENCOUNTER — Other Ambulatory Visit: Payer: Self-pay | Admitting: Internal Medicine

## 2022-03-16 ENCOUNTER — Other Ambulatory Visit: Payer: Self-pay | Admitting: Internal Medicine

## 2022-03-24 ENCOUNTER — Telehealth: Payer: Self-pay | Admitting: Internal Medicine

## 2022-03-24 DIAGNOSIS — M159 Polyosteoarthritis, unspecified: Secondary | ICD-10-CM

## 2022-03-24 NOTE — Telephone Encounter (Signed)
Caller & Relationship to patient: Self  Call back number: [phone]   Date of last office visit: 11.1.23  Date of next office visit: 1.2.24  Medication(s) to be refilled:  HYDROcodone-acetaminophen (NORCO/VICODIN) 5-325 MG tablet   montelukast (SINGULAIR) 10 MG tablet   Preferred Pharmacy:  [facility] #[id number]   Phone: [phone]  Fax: [phone]

## 2022-03-26 ENCOUNTER — Other Ambulatory Visit: Payer: Self-pay | Admitting: Internal Medicine

## 2022-03-26 MED ORDER — HYDROCODONE-ACETAMINOPHEN 5-325 MG PO TABS: 1.0000 | ORAL_TABLET | Freq: Three times a day (TID) | ORAL | 0 refills | Status: DC | PRN

## 2022-03-26 MED ORDER — MONTELUKAST SODIUM 10 MG PO TABS: ORAL_TABLET | ORAL | 3 refills | Status: DC

## 2022-03-26 NOTE — Telephone Encounter (Signed)
Done. Thanks.

## 2022-04-01 ENCOUNTER — Other Ambulatory Visit: Payer: Self-pay | Admitting: Internal Medicine

## 2022-04-08 ENCOUNTER — Encounter: Payer: Self-pay | Admitting: Internal Medicine

## 2022-04-08 ENCOUNTER — Ambulatory Visit: Payer: BC Managed Care – PPO | Admitting: Internal Medicine

## 2022-04-08 VITALS — BP 128/80 | HR 73 | Temp 98.2°F | Ht 65.0 in | Wt 164.0 lb

## 2022-04-08 DIAGNOSIS — I10 Essential (primary) hypertension: Secondary | ICD-10-CM

## 2022-04-08 DIAGNOSIS — M79606 Pain in leg, unspecified: Secondary | ICD-10-CM

## 2022-04-08 DIAGNOSIS — F419 Anxiety disorder, unspecified: Secondary | ICD-10-CM

## 2022-04-08 DIAGNOSIS — M159 Polyosteoarthritis, unspecified: Secondary | ICD-10-CM

## 2022-04-08 DIAGNOSIS — M545 Low back pain, unspecified: Secondary | ICD-10-CM

## 2022-04-08 DIAGNOSIS — L959 Vasculitis limited to the skin, unspecified: Secondary | ICD-10-CM | POA: Diagnosis not present

## 2022-04-08 LAB — COMPREHENSIVE METABOLIC PANEL
ALT: 14 U/L (ref 0–35)
AST: 24 U/L (ref 0–37)
Albumin: 4.1 g/dL (ref 3.5–5.2)
Alkaline Phosphatase: 69 U/L (ref 39–117)
BUN: 21 mg/dL (ref 6–23)
CO2: 27 mEq/L (ref 19–32)
Calcium: 10.1 mg/dL (ref 8.4–10.5)
Chloride: 102 mEq/L (ref 96–112)
Creatinine, Ser: 1.29 mg/dL — ABNORMAL HIGH (ref 0.40–1.20)
GFR: 45.22 mL/min — ABNORMAL LOW (ref >60.00)
Glucose, Bld: 92 mg/dL (ref 70–99)
Potassium: 4.2 mEq/L (ref 3.5–5.1)
Sodium: 138 mEq/L (ref 135–145)
Total Bilirubin: 0.6 mg/dL (ref 0.2–1.2)
Total Protein: 7.7 g/dL (ref 6.0–8.3)

## 2022-04-08 LAB — CBC WITH DIFFERENTIAL/PLATELET
Basophils Absolute: 0 10*3/uL (ref 0.0–0.1)
Basophils Relative: 0.7 % (ref 0.0–3.0)
Eosinophils Absolute: 0 10*3/uL (ref 0.0–0.7)
Eosinophils Relative: 0.7 % (ref 0.0–5.0)
HCT: 32.9 % — ABNORMAL LOW (ref 36.0–46.0)
Hemoglobin: 11.1 g/dL — ABNORMAL LOW (ref 12.0–15.0)
Lymphocytes Relative: 36.3 % (ref 12.0–46.0)
Lymphs Abs: 2.5 10*3/uL (ref 0.7–4.0)
MCHC: 33.6 g/dL (ref 30.0–36.0)
MCV: 91.7 fl (ref 78.0–100.0)
Monocytes Absolute: 0.7 10*3/uL (ref 0.1–1.0)
Monocytes Relative: 10.6 % (ref 3.0–12.0)
Neutro Abs: 3.5 10*3/uL (ref 1.4–7.7)
Neutrophils Relative %: 51.7 % (ref 43.0–77.0)
Platelets: 275 10*3/uL (ref 150.0–400.0)
RBC: 3.59 Mil/uL — ABNORMAL LOW (ref 3.87–5.11)
RDW: 13.7 % (ref 11.5–15.5)
WBC: 6.7 10*3/uL (ref 4.0–10.5)

## 2022-04-08 MED ORDER — HYDROCODONE-ACETAMINOPHEN 5-325 MG PO TABS: 1.0000 | ORAL_TABLET | Freq: Three times a day (TID) | ORAL | 0 refills | Status: DC | PRN

## 2022-04-08 NOTE — Assessment & Plan Note (Signed)
On Furosemide, KCl

## 2022-04-08 NOTE — Assessment & Plan Note (Signed)
Cont w/Norco prn  Potential benefits of a long term opioids use as well as potential risks (i.e. addiction risk, apnea etc) and complications (i.e. Somnolence, constipation and others) were explained to the patient and were aknowledged.

## 2022-04-08 NOTE — Progress Notes (Signed)
Subjective:  Patient ID: Jessica Lynn, female    DOB: [birth year]  Age: 62 y.o. MRN: [id number]  CC: Follow-up (Having pain in both shoulders)   HPI Jessica Lynn presents for chronic pain, leg wounds, allergies, anxiety  Outpatient Medications Prior to Visit  Medication Sig Dispense Refill   Cholecalciferol (VITAMIN D3) 2000 units capsule Take 1 capsule (2,000 Units total) by mouth daily. 100 capsule 3   dorzolamide-timolol (COSOPT) 22.3-6.8 MG/ML ophthalmic solution Place 1 drop into the right eye 2 (two) times daily.     ferrous sulfate 325 (65 FE) MG tablet Take 1 tablet (325 mg total) by mouth daily. 30 tablet 5   folic acid (FOLVITE) 1 MG tablet TAKE 1 TABLET(1 MG) BY MOUTH DAILY (Patient taking differently: Take 1 mg by mouth daily.) 100 tablet 3   hydrOXYzine (ATARAX/VISTARIL) 25 MG tablet TAKE 1 TABLET BY MOUTH EVERY 8 HOURS AS NEEDED FOR ITCHING (Patient taking differently: Take 25 mg by mouth daily as needed for itching.) 270 tablet 1   KLOR-CON M20 20 MEQ tablet TAKE 1 TABLET BY MOUTH EVERY DAY 90 tablet 1   montelukast (SINGULAIR) 10 MG tablet TAKE 1 TABLET(10 MG) BY MOUTH DAILY 90 tablet 3   Zinc Sulfate 220 (50 Zn) MG TABS Take 1 tablet (220 mg total) by mouth daily. 30 tablet 0   zolpidem (AMBIEN) 10 MG tablet TAKE 1 TABLET(10 MG) BY MOUTH AT BEDTIME AS NEEDED FOR SLEEP 30 tablet 1   HYDROcodone-acetaminophen (NORCO/VICODIN) 5-325 MG tablet Take 1 tablet by mouth 3 (three) times daily as needed for severe pain. 90 tablet 0   furosemide (LASIX) 40 MG tablet TAKE 1 TABLET BY MOUTH EVERY DAY (Patient not taking: Reported on 04/08/2022) 90 tablet 1   methocarbamol (ROBAXIN) 500 MG tablet Take 1 tablet (500 mg total) by mouth every 6 (six) hours as needed for muscle spasms. (Patient not taking: Reported on 04/08/2022) 20 tablet 0   naproxen (NAPROSYN) 500 MG tablet Take 500 mg by mouth 2 (two) times daily. (Patient not taking: Reported on 04/08/2022)     SANTYL 250 UNIT/GM ointment  Apply 1 Application topically daily. (Patient not taking: Reported on 04/08/2022)     No facility-administered medications prior to visit.    ROS: Review of Systems  Constitutional:  Negative for activity change, appetite change, chills, fatigue and unexpected weight change.  HENT:  Negative for congestion, mouth sores and sinus pressure.   Eyes:  Positive for photophobia and visual disturbance.  Respiratory:  Negative for cough and chest tightness.   Gastrointestinal:  Negative for abdominal pain and nausea.  Genitourinary:  Negative for difficulty urinating, frequency and vaginal pain.  Musculoskeletal:  Positive for arthralgias and back pain. Negative for gait problem.  Skin:  Positive for color change and wound. Negative for pallor and rash.  Neurological:  Negative for dizziness, tremors, weakness, numbness and headaches.  Psychiatric/Behavioral:  Negative for confusion and sleep disturbance.     Objective:  BP 128/80 (BP Location: Left Arm, Patient Position: Sitting, Cuff Size: Normal)   Pulse 73   Temp 98.2 F (36.8 C) (Oral)   Ht '5\' 5"'$  (1.651 m)   Wt 164 lb (74.4 kg)   LMP 06/06/2015 Comment: irregular   SpO2 99%   BMI 27.29 kg/m   BP Readings from Last 3 Encounters:  04/08/22 128/80  02/05/22 (!) 142/90  12/19/21 (!) 158/84    Wt Readings from Last 3 Encounters:  04/08/22 164 lb (74.4 kg)  02/05/22 169 lb (76.7 kg)  12/19/21 206 lb 9.6 oz (93.7 kg)    Physical Exam Constitutional:      General: She is not in acute distress.    Appearance: She is well-developed. She is obese.  HENT:     Head: Normocephalic.     Right Ear: External ear normal.     Left Ear: External ear normal.     Nose: Nose normal.  Eyes:     General:        Right eye: No discharge.        Left eye: No discharge.     Conjunctiva/sclera: Conjunctivae normal.     Pupils: Pupils are equal, round, and reactive to light.  Neck:     Thyroid: No thyromegaly.     Vascular: No JVD.      Trachea: No tracheal deviation.  Cardiovascular:     Rate and Rhythm: Normal rate and regular rhythm.     Heart sounds: Normal heart sounds.  Pulmonary:     Effort: No respiratory distress.     Breath sounds: No stridor. No wheezing.  Abdominal:     General: Bowel sounds are normal. There is no distension.     Palpations: Abdomen is soft. There is no mass.     Tenderness: There is no abdominal tenderness. There is no guarding or rebound.  Musculoskeletal:        General: No tenderness.     Cervical back: Normal range of motion and neck supple. No rigidity.  Lymphadenopathy:     Cervical: No cervical adenopathy.  Skin:    Findings: Bruising present. No erythema or rash.  Neurological:     Mental Status: She is oriented to person, place, and time.     Cranial Nerves: No cranial nerve deficit.     Motor: No abnormal muscle tone.     Coordination: Coordination normal.     Gait: Gait abnormal.     Deep Tendon Reflexes: Reflexes normal.  Psychiatric:        Behavior: Behavior normal.        Thought Content: Thought content normal.        Judgment: Judgment normal.   Hyperpigm residual scars on LEs. No open sores   Lab Results  Component Value Date   WBC 6.4 02/05/2022   HGB 10.5 (L) 02/05/2022   HCT 31.8 (L) 02/05/2022   PLT 283.0 02/05/2022   GLUCOSE 85 02/05/2022   CHOL 148 12/16/2019   TRIG 71 12/16/2019   HDL 70 12/16/2019   LDLDIRECT 160.7 02/06/2012   LDLCALC 63 12/16/2019   ALT 7 02/05/2022   AST 19 02/05/2022   NA 135 02/05/2022   K 3.9 02/05/2022   CL 98 02/05/2022   CREATININE 1.40 (H) 02/05/2022   BUN 28 (H) 02/05/2022   CO2 29 02/05/2022   TSH 1.65 09/16/2021   INR 1.4 (H) 12/08/2021   HGBA1C 5.5 09/16/2021    DG Tibia/Fibula Right  Result Date: 12/08/2021 CLINICAL DATA:  Preprocedure.  Rule out metal in leg prior to MRI. EXAM: RIGHT TIBIA AND FIBULA - 2 VIEW COMPARISON:  Radiograph yesterday. Right lower extremity MRI 10/29/2021 FINDINGS: Stable skin  irregularity about the lateral aspect of the lower leg. No radiopaque or metallic foreign body. There is no tracking soft tissue gas. Generalized subcutaneous edema. No fracture, periosteal reaction or bone destruction. Overlying dressing in place. IMPRESSION: Stable radiograph from yesterday. Stable skin irregularity about the lateral aspect of the lower leg. No  radiopaque or metallic foreign body. Generalized subcutaneous edema. Electronically Signed   By: Keith Rake M.D.   On: 12/08/2021 17:10   MR LUMBAR SPINE WO CONTRAST  Result Date: 12/08/2021 CLINICAL DATA:  Initial evaluation for low back pain, infection suspected. EXAM: MRI LUMBAR SPINE WITHOUT CONTRAST TECHNIQUE: Multiplanar, multisequence MR imaging of the lumbar spine was performed. No intravenous contrast was administered. COMPARISON:  Prior MRI from 08/31/2014. FINDINGS: Segmentation: Examination technically limited as the patient was unable to tolerate the full length of the exam. Sagittal sequences only were obtained. No axial images available for review. Standard segmentation. Lowest well-formed disc space labeled the L5-S1 level. Alignment: 7 mm anterolisthesis of L4 on L5, likely chronic and facet mediated. Trace degenerative retrolisthesis of L1 on L2 and L2 on L3. Underlying mild scoliosis. Vertebrae: Vertebral body height maintained without acute or chronic fracture. Bone marrow signal intensity within normal limits. No discrete or worrisome osseous lesions. Mild reactive edema present about the L2-3 and L3-4 facets, favored to be degenerative in nature and due to facet arthritis. No other evidence for osteomyelitis discitis or other infection within the lumbar spine. Conus medullaris and cauda equina: Conus extends to the L1-2 level. Conus and cauda equina appear normal. Paraspinal and other soft tissues: Diffuse edema seen throughout the subcutaneous fat of the lower back, nonspecific. No visible loculated collections. Fibroid  uterus noted. Disc levels: T12-L1: Mild disc bulge with endplate spurring. No significant stenosis. L1-2: Mild disc bulge with endplate spurring. Bilateral facet hypertrophy. No significant spinal stenosis. Foramina appear grossly patent. L2-3: Degenerative intervertebral disc space narrowing with diffuse disc bulge and disc desiccation. Reactive endplate spurring. No significant spinal stenosis seen on this limited exam. Mild-to-moderate bilateral foraminal narrowing. L3-4: Degenerative intervertebral disc space narrowing with mild diffuse disc bulge and disc desiccation. Reactive endplate spurring. Moderate facet hypertrophy. No significant spinal stenosis on this limited exam. Mild-to-moderate bilateral facet hypertrophy. L4-5: Anterolisthesis. Disc desiccation with broad posterior pseudo disc bulge/uncovering. Moderate bilateral facet arthrosis. No significant spinal stenosis on this limited exam. Mild to moderate bilateral foraminal narrowing. L5-S1: No significant disc bulge. Mild facet hypertrophy. No significant stenosis. IMPRESSION: 1. Technically limited study due to the patient's inability to tolerate the full length of the exam. Sagittal sequences only were obtained. 2. Diffuse edema throughout the subcutaneous fat of the lower back. Finding is nonspecific, and could be related to overall volume status. Regional cellulitis could also have this appearance. Correlation with physical exam recommended. No loculated collections. 3. No other evidence for acute infection within the lumbar spine. 4. Underlying moderate multilevel degenerative spondylosis and facet arthrosis as above. No significant spinal stenosis evident on this limited exam. Mild-to-moderate bilateral foraminal narrowing at L2-3 through L4-5. Electronically Signed   By: Jeannine Boga M.D.   On: 12/08/2021 01:18   DG Tibia/Fibula Right  Result Date: 12/07/2021 CLINICAL DATA:  Chronic right lower extremity wound EXAM: RIGHT TIBIA AND  FIBULA - 2 VIEW COMPARISON:  None Available. FINDINGS: Normal alignment. No acute fracture or dislocation. No osseous erosions or abnormal periosteal reaction. There is diffuse subcutaneous edema involving the right lower extremity. Shallow ulcer noted along the medial aspect of the right lower extremity at the level of the mid diaphysis of the tibia and fibula. IMPRESSION: Soft tissue ulcer and diffuse subcutaneous edema. No radiographic evidence of osteomyelitis. Electronically Signed   By: Fidela Salisbury M.D.   On: 12/07/2021 20:05   DG Chest 2 View  Result Date: 12/07/2021 CLINICAL DATA:  Possible  sepsis EXAM: CHEST - 2 VIEW COMPARISON:  10/28/2021 FINDINGS: Transverse diameter of heart is increased. This may be partly due to poor inspiration. There are no signs of alveolar pulmonary edema. Linear densities are seen in medial left lower lung field. There is no focal pulmonary consolidation. There is no significant pleural effusion or pneumothorax. IMPRESSION: New linear densities in the medial left lower lung fields suggest subsegmental atelectasis. There are no signs of pulmonary edema or focal pulmonary consolidation. Electronically Signed   By: Elmer Picker M.D.   On: 12/07/2021 18:19    Assessment & Plan:   Problem List Items Addressed This Visit     Vasculitis of skin - Primary    Resolved. No open sores      Primary osteoarthritis involving multiple joints   Relevant Medications   naproxen (NAPROSYN) 500 MG tablet   HYDROcodone-acetaminophen (NORCO/VICODIN) 5-325 MG tablet   Low back pain radiating down leg    Cont w/Norco prn  Potential benefits of a long term opioids use as well as potential risks (i.e. addiction risk, apnea etc) and complications (i.e. Somnolence, constipation and others) were explained to the patient and were aknowledged.      Relevant Medications   naproxen (NAPROSYN) 500 MG tablet   HYDROcodone-acetaminophen (NORCO/VICODIN) 5-325 MG tablet   Essential  hypertension    On Furosemide, KCl      Anxiety disorder    Cont w/Clonazepam prn  Potential benefits of a long term benzodiazepines  use as well as potential risks  and complications were explained to the patient and were aknowledged. Not to take w/Norco         Meds ordered this encounter  Medications   HYDROcodone-acetaminophen (NORCO/VICODIN) 5-325 MG tablet    Sig: Take 1 tablet by mouth 3 (three) times daily as needed for severe pain.    Dispense:  90 tablet    Refill:  0    Please fill on or after 04/24/22.  Office visit every 3 months      Follow-up: No follow-ups on file.  Walker Kehr, MD

## 2022-04-08 NOTE — Assessment & Plan Note (Signed)
Cont w/Clonazepam prn  Potential benefits of a long term benzodiazepines  use as well as potential risks  and complications were explained to the patient and were aknowledged. Not to take w/Norco

## 2022-04-08 NOTE — Assessment & Plan Note (Signed)
Resolved. No open sores

## 2022-04-23 ENCOUNTER — Ambulatory Visit: Payer: BC Managed Care – PPO

## 2022-05-14 DIAGNOSIS — H25813 Combined forms of age-related cataract, bilateral: Secondary | ICD-10-CM | POA: Insufficient documentation

## 2022-05-20 ENCOUNTER — Telehealth: Payer: Self-pay | Admitting: Internal Medicine

## 2022-05-20 DIAGNOSIS — M159 Polyosteoarthritis, unspecified: Secondary | ICD-10-CM

## 2022-05-20 NOTE — Telephone Encounter (Signed)
MEDICATION: HYDROcodone-acetaminophen (NORCO/VICODIN) 5-325 MG tablet  zolpidem (AMBIEN) 10 MG tablet   PHARMACY:[id number] - [place], [place] - [address]   Comments:   **Let patient know to contact pharmacy at the end of the day to make sure medication is ready. **  ** Please notify patient to allow 48-72 hours to process**  **Encourage patient to contact the pharmacy for refills or they can request refills through [facility]**

## 2022-05-21 ENCOUNTER — Other Ambulatory Visit: Payer: Self-pay | Admitting: Internal Medicine

## 2022-05-21 MED ORDER — HYDROCODONE-ACETAMINOPHEN 5-325 MG PO TABS: 1.0000 | ORAL_TABLET | Freq: Three times a day (TID) | ORAL | 0 refills | Status: DC | PRN

## 2022-05-21 MED ORDER — ZOLPIDEM TARTRATE 10 MG PO TABS: ORAL_TABLET | ORAL | 1 refills | Status: DC

## 2022-05-21 NOTE — Telephone Encounter (Signed)
Okay.  Thanks.

## 2022-06-11 ENCOUNTER — Ambulatory Visit
Admission: RE | Admit: 2022-06-11 | Discharge: 2022-06-11 | Disposition: A | Discharge status: Home / Self Care | Payer: BC Managed Care – PPO | Source: Ambulatory Visit | Attending: Internal Medicine | Admitting: Internal Medicine

## 2022-06-11 DIAGNOSIS — Z1231 Encounter for screening mammogram for malignant neoplasm of breast: Secondary | ICD-10-CM

## 2022-06-13 ENCOUNTER — Other Ambulatory Visit: Payer: Self-pay | Admitting: Internal Medicine

## 2022-06-16 ENCOUNTER — Other Ambulatory Visit: Payer: Self-pay | Admitting: Internal Medicine

## 2022-06-16 DIAGNOSIS — R928 Other abnormal and inconclusive findings on diagnostic imaging of breast: Secondary | ICD-10-CM

## 2022-06-18 ENCOUNTER — Telehealth: Payer: Self-pay | Admitting: Internal Medicine

## 2022-06-18 DIAGNOSIS — M159 Polyosteoarthritis, unspecified: Secondary | ICD-10-CM

## 2022-06-18 NOTE — Telephone Encounter (Signed)
Prescription Request  06/18/2022  LOV: 04/08/2022  What is the name of the medication or equipment?  hydrocodone Have you contacted your pharmacy to request a refill? No   Which pharmacy would you like this sent to?  [id number] - [place], [place] - [address] [place] [address] Phone: [phone] Fax: [phone]   Patient notified that their request is being sent to the clinical staff for review and that they should receive a response within 2 business days.   Please advise at Mobile [phone] (mobile)

## 2022-06-22 MED ORDER — HYDROCODONE-ACETAMINOPHEN 5-325 MG PO TABS: 1.0000 | ORAL_TABLET | Freq: Three times a day (TID) | ORAL | 0 refills | Status: DC | PRN

## 2022-06-22 NOTE — Telephone Encounter (Signed)
Done. Thanks.

## 2022-06-26 ENCOUNTER — Ambulatory Visit
Admission: RE | Admit: 2022-06-26 | Discharge: 2022-06-26 | Disposition: A | Discharge status: Home / Self Care | Payer: BC Managed Care – PPO | Source: Ambulatory Visit | Attending: Internal Medicine | Admitting: Internal Medicine

## 2022-06-26 ENCOUNTER — Other Ambulatory Visit: Payer: Self-pay | Admitting: Internal Medicine

## 2022-06-26 DIAGNOSIS — R921 Mammographic calcification found on diagnostic imaging of breast: Secondary | ICD-10-CM

## 2022-06-26 DIAGNOSIS — R928 Other abnormal and inconclusive findings on diagnostic imaging of breast: Secondary | ICD-10-CM

## 2022-07-19 ENCOUNTER — Other Ambulatory Visit: Payer: Self-pay | Admitting: Internal Medicine

## 2022-07-21 ENCOUNTER — Telehealth: Payer: Self-pay | Admitting: Internal Medicine

## 2022-07-21 DIAGNOSIS — M159 Polyosteoarthritis, unspecified: Secondary | ICD-10-CM

## 2022-07-21 NOTE — Telephone Encounter (Signed)
Prescription Request  07/21/2022  LOV: 04/08/2022  What is the name of the medication or equipment? HYDROcodone-acetaminophen 5-325mg    Have you contacted your pharmacy to request a refill? No   Which pharmacy would you like this sent to?  [id number] - [place], [place] - [address] [place] [address] Phone: [phone] Fax: [phone]    Patient notified that their request is being sent to the clinical staff for review and that they should receive a response within 2 business days.   Please advise at Mobile [phone] (mobile)

## 2022-07-23 MED ORDER — HYDROCODONE-ACETAMINOPHEN 5-325 MG PO TABS
1.0000 | ORAL_TABLET | Freq: Three times a day (TID) | ORAL | 0 refills | Status: DC | PRN
Start: 2022-07-23 — End: 2022-08-22

## 2022-07-23 NOTE — Telephone Encounter (Signed)
Done. Thanks.

## 2022-07-25 ENCOUNTER — Other Ambulatory Visit: Payer: Self-pay | Admitting: Internal Medicine

## 2022-07-25 MED ORDER — ZOLPIDEM TARTRATE 10 MG PO TABS: ORAL_TABLET | ORAL | 1 refills | Status: DC

## 2022-07-25 NOTE — Telephone Encounter (Signed)
Prescription Request  07/25/2022  LOV: 04/08/2022  What is the name of the medication or equipment? zolpidem (AMBIEN) 10 MG tablet   Have you contacted your pharmacy to request a refill? No   Which pharmacy would you like this sent to?     Patient notified that their request is being sent to the clinical staff for review and that they  [id number] - [place], [place] - [address] [place] [address] Phone: [phone] Fax: [phone]  Please advise at Mobile [phone] (mobile)

## 2022-07-25 NOTE — Telephone Encounter (Signed)
Controlled rx.. please advise refill  

## 2022-08-06 ENCOUNTER — Ambulatory Visit: Payer: BC Managed Care – PPO | Admitting: Internal Medicine

## 2022-08-06 VITALS — BP 158/86 | HR 67 | Temp 98.1°F | Ht 65.0 in | Wt 155.0 lb

## 2022-08-06 DIAGNOSIS — M65331 Trigger finger, right middle finger: Secondary | ICD-10-CM | POA: Diagnosis not present

## 2022-08-06 DIAGNOSIS — M25512 Pain in left shoulder: Secondary | ICD-10-CM | POA: Diagnosis not present

## 2022-08-06 DIAGNOSIS — N183 Chronic kidney disease, stage 3 unspecified: Secondary | ICD-10-CM | POA: Diagnosis not present

## 2022-08-06 DIAGNOSIS — M6530 Trigger finger, unspecified finger: Secondary | ICD-10-CM | POA: Insufficient documentation

## 2022-08-06 NOTE — Assessment & Plan Note (Signed)
Middle finger Blue-Emu cream was recommended to use 2-3 times a day Spl[place]

## 2022-08-06 NOTE — Assessment & Plan Note (Signed)
Shoulder and C spine OA Blue-Emu cream was recommended to use 2-3 times a day Steroids are optional - discussed

## 2022-08-06 NOTE — Patient Instructions (Addendum)
Blue-Emu cream -- use 2-3 times a day  USEFUL THINGS FOR HAND ARTHRITIS  RICE SOCK HEATING PAD: [email or web address]://www.instructables.com/Homemade-Heating-Pad/    SILICONE PADS:    BRIX JAR OPENER:    NITRILE COATED GARDEN GLOVES:   THUMB BRACE:     BLUE EMU CREAM:

## 2022-08-06 NOTE — Progress Notes (Signed)
Subjective:  Patient ID: Jessica Lynn, female    DOB: 08/21/1960  Age: 62 y.o. MRN: [id number]  CC: Shoulder Pain (B/l Shoulder pain)   HPI Jessica Lynn presents for L shoulder pain with L neck pain x 2-3 months, worse F/u LBP, elevated BP  Outpatient Medications Prior to Visit  Medication Sig Dispense Refill  . Cholecalciferol (VITAMIN D3) 2000 units capsule Take 1 capsule (2,000 Units total) by mouth daily. 100 capsule 3  . dorzolamide-timolol (COSOPT) 22.3-6.8 MG/ML ophthalmic solution Place 1 drop into the right eye 2 (two) times daily.    . ferrous sulfate (FEROSUL) 325 (65 FE) MG tablet TAKE 1 TABLET(325 MG) BY MOUTH DAILY 30 tablet 2  . folic acid (FOLVITE) 1 MG tablet TAKE 1 TABLET(1 MG) BY MOUTH DAILY 100 tablet 3  . furosemide (LASIX) 40 MG tablet TAKE 1 TABLET BY MOUTH EVERY DAY 90 tablet 1  . HYDROcodone-acetaminophen (NORCO/VICODIN) 5-325 MG tablet Take 1 tablet by mouth 3 (three) times daily as needed for severe pain. 90 tablet 0  . hydrOXYzine (ATARAX/VISTARIL) 25 MG tablet TAKE 1 TABLET BY MOUTH EVERY 8 HOURS AS NEEDED FOR ITCHING (Patient taking differently: Take 25 mg by mouth daily as needed for itching.) 270 tablet 1  . KLOR-CON M20 20 MEQ tablet TAKE 1 TABLET BY MOUTH EVERY DAY 90 tablet 1  . montelukast (SINGULAIR) 10 MG tablet TAKE 1 TABLET(10 MG) BY MOUTH DAILY 90 tablet 3  . Zinc Sulfate 220 (50 Zn) MG TABS Take 1 tablet (220 mg total) by mouth daily. 30 tablet 0  . zolpidem (AMBIEN) 10 MG tablet TAKE 1 TABLET(10 MG) BY MOUTH AT BEDTIME AS NEEDED FOR SLEEP 30 tablet 1  . naproxen (NAPROSYN) 500 MG tablet TAKE 1 TABLET BY MOUTH TWICE DAILY WITH FOOD FOR MODERATE PAIN 60 tablet 0  . methocarbamol (ROBAXIN) 500 MG tablet Take 1 tablet (500 mg total) by mouth every 6 (six) hours as needed for muscle spasms. (Patient not taking: Reported on 04/08/2022) 20 tablet 0  . SANTYL 250 UNIT/GM ointment Apply 1 Application topically daily. (Patient not taking: Reported on  04/08/2022)     No facility-administered medications prior to visit.    ROS: Review of Systems  Constitutional:  Positive for fatigue. Negative for activity change, appetite change, chills and unexpected weight change.  HENT:  Negative for congestion, mouth sores and sinus pressure.   Eyes:  Negative for visual disturbance.  Respiratory:  Negative for cough and chest tightness.   Gastrointestinal:  Negative for abdominal pain, constipation and nausea.  Genitourinary:  Negative for difficulty urinating, frequency and vaginal pain.  Musculoskeletal:  Positive for arthralgias, back pain, neck pain and neck stiffness. Negative for gait problem.  Skin:  Negative for pallor and rash.  Neurological:  Negative for dizziness, tremors, weakness, numbness and headaches.  Psychiatric/Behavioral:  Negative for confusion and sleep disturbance.     Objective:  BP (!) 158/86 (BP Location: Left Arm, Patient Position: Sitting, Cuff Size: Normal)   Pulse 67   Temp 98.1 F (36.7 C) (Oral)   Ht 5\' 5"  (1.651 m)   Wt 155 lb (70.3 kg)   LMP 06/06/2015 Comment: irregular   SpO2 99%   BMI 25.79 kg/m   BP Readings from Last 3 Encounters:  08/06/22 (!) 158/86  04/08/22 128/80  02/05/22 (!) 142/90    Wt Readings from Last 3 Encounters:  08/06/22 155 lb (70.3 kg)  04/08/22 164 lb (74.4 kg)  02/05/22 169 lb (76.7  kg)    Physical Exam Constitutional:      General: She is not in acute distress.    Appearance: She is well-developed. She is obese.  HENT:     Head: Normocephalic.     Right Ear: External ear normal.     Left Ear: External ear normal.     Nose: Nose normal.  Eyes:     General:        Right eye: No discharge.        Left eye: No discharge.     Conjunctiva/sclera: Conjunctivae normal.     Pupils: Pupils are equal, round, and reactive to light.  Neck:     Thyroid: No thyromegaly.     Vascular: No JVD.     Trachea: No tracheal deviation.  Cardiovascular:     Rate and Rhythm:  Normal rate and regular rhythm.     Heart sounds: Normal heart sounds.  Pulmonary:     Effort: No respiratory distress.     Breath sounds: No stridor. No wheezing.  Abdominal:     General: Bowel sounds are normal. There is no distension.     Palpations: Abdomen is soft. There is no mass.     Tenderness: There is no abdominal tenderness. There is no guarding or rebound.  Musculoskeletal:        General: No tenderness.     Cervical back: Normal range of motion and neck supple. No rigidity.  Lymphadenopathy:     Cervical: No cervical adenopathy.  Skin:    Findings: No erythema or rash.  Neurological:     Cranial Nerves: No cranial nerve deficit.     Motor: No abnormal muscle tone.     Coordination: Coordination normal.     Deep Tendon Reflexes: Reflexes normal.  Psychiatric:        Behavior: Behavior normal.        Thought Content: Thought content normal.        Judgment: Judgment normal.  R mid finger is triggering L shoulder is NT, except for a biceps tendon L neck - pain over paraspinal muscles  Lab Results  Component Value Date   WBC 6.7 04/08/2022   HGB 11.1 (L) 04/08/2022   HCT 32.9 (L) 04/08/2022   PLT 275.0 04/08/2022   GLUCOSE 92 04/08/2022   CHOL 148 12/16/2019   TRIG 71 12/16/2019   HDL 70 12/16/2019   LDLDIRECT 160.7 02/06/2012   LDLCALC 63 12/16/2019   ALT 14 04/08/2022   AST 24 04/08/2022   NA 138 04/08/2022   K 4.2 04/08/2022   CL 102 04/08/2022   CREATININE 1.29 (H) 04/08/2022   BUN 21 04/08/2022   CO2 27 04/08/2022   TSH 1.65 09/16/2021   INR 1.4 (H) 12/08/2021   HGBA1C 5.5 09/16/2021    MM Digital Diagnostic Unilat L  Result Date: 06/26/2022 CLINICAL DATA:  62 year old female presenting as a recall from screening for left breast calcifications. EXAM: DIGITAL DIAGNOSTIC UNILATERAL LEFT MAMMOGRAM WITH CAD TECHNIQUE: Left digital diagnostic mammography was performed. COMPARISON:  Previous exam(s). ACR Breast Density Category b: There are scattered  areas of fibroglandular density. FINDINGS: Spot 2D magnification views and full true lateral views of the left breast were performed demonstrating persistence of a faint group of coarse calcifications in the upper outer left breast which are similar in appearance to multiple other scattered groups of calcifications in the left breast. There is no associated mass or distortion. These span approximately 0.4 cm. IMPRESSION: Probably benign calcifications  spanning 0.4 cm in the upper outer left breast. RECOMMENDATION: Diagnostic left breast mammogram in 6 months. I have discussed the findings and recommendations with the patient who agrees to short-term follow-up. If applicable, a reminder letter will be sent to the patient regarding the next appointment. BI-RADS CATEGORY  3: Probably benign. Electronically Signed   By: Emmaline Kluver M.D.   On: 06/26/2022 15:40   Assessment & Plan:   Problem List Items Addressed This Visit     Shoulder pain, left - Primary    Shoulder and C spine OA Blue-Emu cream was recommended to use 2-3 times a day Steroids are optional - discussed      Trigger finger of right hand    Middle finger Blue-Emu cream was recommended to use 2-3 times a day Splint      CRI (chronic renal insufficiency), stage 3 (moderate) (HCC)    GFR 48 D/c Naproxen         No orders of the defined types were placed in this encounter.     Follow-up: Return in about 3 months (around 11/06/2022) for a follow-up visit.  Sonda Primes, MD

## 2022-08-06 NOTE — Assessment & Plan Note (Signed)
GFR 48 D/c Naproxen

## 2022-08-09 ENCOUNTER — Encounter: Payer: Self-pay | Admitting: Internal Medicine

## 2022-08-17 ENCOUNTER — Other Ambulatory Visit: Payer: Self-pay | Admitting: Internal Medicine

## 2022-08-18 NOTE — Telephone Encounter (Signed)
Prescription Request  08/18/2022  LOV: 04/08/2022  What is the name of the medication or equipment? hydrocodone  Have you contacted your pharmacy to request a refill? Yes   Which pharmacy would you like this sent to?  [id number] - [place], [place] - [address] [place] [address] Phone: [phone] Fax: [phone]     Patient notified that their request is being sent to the clinical staff for review and that they should receive a response within 2 business days.   Please advise at Mobile [phone] (mobile)

## 2022-08-21 ENCOUNTER — Telehealth: Payer: Self-pay | Admitting: Internal Medicine

## 2022-08-21 DIAGNOSIS — M159 Polyosteoarthritis, unspecified: Secondary | ICD-10-CM

## 2022-08-21 DIAGNOSIS — M150 Primary generalized (osteo)arthritis: Secondary | ICD-10-CM

## 2022-08-21 NOTE — Telephone Encounter (Signed)
Prescription Request  08/21/2022  LOV: 08/06/2022  What is the name of the medication or equipment? HYDROcodone-acetaminophen (NORCO/VICODIN) 5-325 MG tablet   Have you contacted your pharmacy to request a refill? Yes   Which pharmacy would you like this sent to?  [id number] - [place], [place] - [address] [place]  [address] Phone: [phone] Fax: [phone]    Patient notified that their request is being sent to the clinical staff for review and that they should receive a response within 2 business days.   Please advise at Mobile [phone] (mobile)

## 2022-08-22 MED ORDER — HYDROCODONE-ACETAMINOPHEN 5-325 MG PO TABS
1.0000 | ORAL_TABLET | Freq: Three times a day (TID) | ORAL | 0 refills | Status: DC | PRN
Start: 2022-08-22 — End: 2022-09-19

## 2022-08-22 NOTE — Telephone Encounter (Signed)
Done. Thanks.

## 2022-08-28 ENCOUNTER — Other Ambulatory Visit: Payer: Self-pay | Admitting: Internal Medicine

## 2022-08-28 DIAGNOSIS — Z79899 Other long term (current) drug therapy: Secondary | ICD-10-CM

## 2022-08-28 NOTE — Telephone Encounter (Signed)
No recent UDS given high risk substances needs UDS and then forward refill back and this can be approved.

## 2022-08-28 NOTE — Telephone Encounter (Signed)
Called pt inform MD response. Pt states she does not need med right now, but made appt for UA on 09/08/22.Marland KitchenRaechel Chute

## 2022-09-08 ENCOUNTER — Other Ambulatory Visit: Payer: BC Managed Care – PPO

## 2022-09-08 DIAGNOSIS — Z79899 Other long term (current) drug therapy: Secondary | ICD-10-CM

## 2022-09-10 LAB — DRUG MONITOR,BARBITURATE,W/CONF, URINE: Barbiturates: NEGATIVE ng/mL (ref <300)

## 2022-09-10 LAB — DRUG MONITOR,AMPHETAMINE,W/CONF, URINE: Amphetamines: NEGATIVE ng/mL (ref <500)

## 2022-09-10 LAB — DRUG MONITOR, OPIATES,W/CONF, URINE
Codeine: NEGATIVE ng/mL (ref <50)
Hydrocodone: 835 ng/mL — ABNORMAL HIGH (ref <50)
Hydromorphone: 327 ng/mL — ABNORMAL HIGH (ref <50)
Morphine: NEGATIVE ng/mL (ref <50)
Norhydrocodone: 1794 ng/mL — ABNORMAL HIGH (ref <50)
Opiates: POSITIVE ng/mL — AB (ref <100)

## 2022-09-10 LAB — DRUG MONITOR, TRAMADOL,QN, URINE
Desmethyltramadol: NEGATIVE ng/mL (ref <100)
Tramadol: NEGATIVE ng/mL (ref <100)

## 2022-09-10 LAB — DRUG MONITOR, BENZO,W/CONF, URINE: Benzodiazepines: NEGATIVE ng/mL (ref <100)

## 2022-09-10 LAB — PRESCRIBED DRUGS,MEDMATCH(R)

## 2022-09-10 LAB — DM TEMPLATE

## 2022-09-10 LAB — DRUG MONITOR, COCAINEMETAB, W/CONF, URINE: Cocaine Metabolite: NEGATIVE ng/mL (ref <150)

## 2022-09-10 LAB — DRUG MONITOR, OXYCODONE,W/CONF, URINE: Oxycodone: NEGATIVE ng/mL (ref <100)

## 2022-09-14 ENCOUNTER — Other Ambulatory Visit: Payer: Self-pay | Admitting: Internal Medicine

## 2022-09-18 ENCOUNTER — Telehealth: Payer: Self-pay | Admitting: Internal Medicine

## 2022-09-18 DIAGNOSIS — M159 Polyosteoarthritis, unspecified: Secondary | ICD-10-CM

## 2022-09-18 NOTE — Telephone Encounter (Signed)
Prescription Request  09/18/2022  LOV: 08/06/2022  What is the name of the medication or equipment? hydrocodone  Have you contacted your pharmacy to request a refill? Yes   Which pharmacy would you like this sent to?  [id number] - [place], [place] - [address] [place] [address] Phone: [phone] Fax: [phone]   Patient notified that their request is being sent to the clinical staff for review and that they should receive a response within 2 business days.   Please advise at Mobile [phone] (mobile)

## 2022-09-19 MED ORDER — HYDROCODONE-ACETAMINOPHEN 5-325 MG PO TABS
1.0000 | ORAL_TABLET | Freq: Three times a day (TID) | ORAL | 0 refills | Status: DC | PRN
Start: 2022-09-19 — End: 2022-10-20

## 2022-09-19 NOTE — Telephone Encounter (Signed)
Okay.  Thanks.

## 2022-09-29 DIAGNOSIS — H21541 Posterior synechiae (iris), right eye: Secondary | ICD-10-CM | POA: Insufficient documentation

## 2022-09-30 ENCOUNTER — Other Ambulatory Visit: Payer: Self-pay | Admitting: Internal Medicine

## 2022-10-20 ENCOUNTER — Ambulatory Visit (INDEPENDENT_AMBULATORY_CARE_PROVIDER_SITE_OTHER): Payer: BC Managed Care – PPO | Admitting: Internal Medicine

## 2022-10-20 ENCOUNTER — Encounter: Payer: Self-pay | Admitting: Internal Medicine

## 2022-10-20 VITALS — BP 130/80 | HR 74 | Temp 98.3°F | Ht 65.0 in | Wt 151.0 lb

## 2022-10-20 DIAGNOSIS — M25512 Pain in left shoulder: Secondary | ICD-10-CM | POA: Diagnosis not present

## 2022-10-20 DIAGNOSIS — H35063 Retinal vasculitis, bilateral: Secondary | ICD-10-CM

## 2022-10-20 DIAGNOSIS — M79606 Pain in leg, unspecified: Secondary | ICD-10-CM

## 2022-10-20 DIAGNOSIS — M159 Polyosteoarthritis, unspecified: Secondary | ICD-10-CM | POA: Diagnosis not present

## 2022-10-20 DIAGNOSIS — I10 Essential (primary) hypertension: Secondary | ICD-10-CM

## 2022-10-20 DIAGNOSIS — M545 Low back pain, unspecified: Secondary | ICD-10-CM | POA: Diagnosis not present

## 2022-10-20 DIAGNOSIS — E559 Vitamin D deficiency, unspecified: Secondary | ICD-10-CM | POA: Diagnosis not present

## 2022-10-20 MED ORDER — LIDOCAINE HCL 2 % IJ SOLN
20.0000 mL | Freq: Once | INTRAMUSCULAR | Status: AC
Start: 2022-10-20 — End: 2022-10-20
  Administered 2022-10-20: 400 mg

## 2022-10-20 MED ORDER — METHYLPREDNISOLONE ACETATE 40 MG/ML IJ SUSP
40.0000 mg | Freq: Once | INTRAMUSCULAR | Status: AC
Start: 2022-10-20 — End: 2022-10-20
  Administered 2022-10-20: 40 mg via INTRAMUSCULAR

## 2022-10-20 MED ORDER — HYDROCODONE-ACETAMINOPHEN 5-325 MG PO TABS
1.0000 | ORAL_TABLET | Freq: Three times a day (TID) | ORAL | 0 refills | Status: DC | PRN
Start: 2022-10-20 — End: 2022-11-19

## 2022-10-20 NOTE — Assessment & Plan Note (Signed)
Cont w/Vit D 

## 2022-10-20 NOTE — Addendum Note (Signed)
Addended by: Tresa Garter on: 10/20/2022 03:22 PM   Modules accepted: Orders

## 2022-10-20 NOTE — Assessment & Plan Note (Signed)
Pt requested injection See Procedure

## 2022-10-20 NOTE — Assessment & Plan Note (Signed)
Cont w/Norco prn  Potential benefits of a long term opioids use as well as potential risks (i.e. addiction risk, apnea etc) and complications (i.e. Somnolence, constipation and others) were explained to the patient and were aknowledged. 

## 2022-10-20 NOTE — Assessment & Plan Note (Signed)
 On Furosemide, KCl

## 2022-10-20 NOTE — Assessment & Plan Note (Signed)
Labs w/her ophthalmologist q 3 months

## 2022-10-20 NOTE — Addendum Note (Signed)
Addended by: Delsa Grana R on: 10/20/2022 05:02 PM   Modules accepted: Orders

## 2022-10-20 NOTE — Progress Notes (Signed)
Subjective:  Patient ID: Jessica Lynn, female    DOB: 1960-12-07  Age: 62 y.o. MRN: [id number]  CC: Follow-up (6 MNTH, LT shouler pain radiating down lt arm)   HPI LORIELLE BOEHNING presents for L shoulder pain - worse F/u on LBP, insomnia, vasculitis  Outpatient Medications Prior to Visit  Medication Sig Dispense Refill   Cholecalciferol (VITAMIN D3) 2000 units capsule Take 1 capsule (2,000 Units total) by mouth daily. 100 capsule 3   dorzolamide-timolol (COSOPT) 22.3-6.8 MG/ML ophthalmic solution Place 1 drop into the right eye 2 (two) times daily.     ferrous sulfate (FEROSUL) 325 (65 FE) MG tablet TAKE 1 TABLET(325 MG) BY MOUTH DAILY 30 tablet 2   folic acid (FOLVITE) 1 MG tablet TAKE 1 TABLET(1 MG) BY MOUTH DAILY 100 tablet 3   furosemide (LASIX) 40 MG tablet TAKE 1 TABLET BY MOUTH EVERY DAY 90 tablet 1   HYDROcodone-acetaminophen (NORCO/VICODIN) 5-325 MG tablet Take 1 tablet by mouth 3 (three) times daily as needed for severe pain. 90 tablet 0   hydrOXYzine (ATARAX/VISTARIL) 25 MG tablet TAKE 1 TABLET BY MOUTH EVERY 8 HOURS AS NEEDED FOR ITCHING (Patient taking differently: Take 25 mg by mouth daily as needed for itching.) 270 tablet 1   KLOR-CON M20 20 MEQ tablet TAKE 1 TABLET BY MOUTH EVERY DAY 90 tablet 1   montelukast (SINGULAIR) 10 MG tablet TAKE 1 TABLET(10 MG) BY MOUTH DAILY 90 tablet 3   Zinc Sulfate 220 (50 Zn) MG TABS Take 1 tablet (220 mg total) by mouth daily. 30 tablet 0   zolpidem (AMBIEN) 10 MG tablet TAKE 1 TABLET(10 MG) BY MOUTH AT BEDTIME AS NEEDED FOR SLEEP 30 tablet 3   No facility-administered medications prior to visit.    ROS: Review of Systems  Constitutional:  Negative for activity change, appetite change, chills, fatigue and unexpected weight change.  HENT:  Negative for congestion, mouth sores and sinus pressure.   Eyes:  Negative for visual disturbance.  Respiratory:  Negative for cough and chest tightness.   Gastrointestinal:  Negative for  abdominal pain and nausea.  Genitourinary:  Negative for difficulty urinating, frequency and vaginal pain.  Musculoskeletal:  Positive for arthralgias, back pain and gait problem.  Skin:  Negative for pallor and rash.  Neurological:  Negative for dizziness, tremors, weakness, numbness and headaches.  Hematological:  Does not bruise/bleed easily.  Psychiatric/Behavioral:  Negative for confusion and sleep disturbance.     Objective:  BP 130/80 (BP Location: Right Arm, Patient Position: Sitting, Cuff Size: Large)   Pulse 74   Temp 98.3 F (36.8 C) (Oral)   Ht 5\' 5"  (1.651 m)   Wt 151 lb (68.5 kg)   LMP 06/06/2015 Comment: irregular   SpO2 99%   BMI 25.13 kg/m   BP Readings from Last 3 Encounters:  10/20/22 130/80  08/06/22 (!) 158/86  04/08/22 128/80    Wt Readings from Last 3 Encounters:  10/20/22 151 lb (68.5 kg)  08/06/22 155 lb (70.3 kg)  04/08/22 164 lb (74.4 kg)    Physical Exam Constitutional:      General: She is not in acute distress.    Appearance: Normal appearance. She is well-developed.  HENT:     Head: Normocephalic.     Right Ear: External ear normal.     Left Ear: External ear normal.     Nose: Nose normal.  Eyes:     General:        Right eye: No  discharge.        Left eye: No discharge.     Conjunctiva/sclera: Conjunctivae normal.     Pupils: Pupils are equal, round, and reactive to light.  Neck:     Thyroid: No thyromegaly.     Vascular: No JVD.     Trachea: No tracheal deviation.  Cardiovascular:     Rate and Rhythm: Normal rate and regular rhythm.     Heart sounds: Normal heart sounds.  Pulmonary:     Effort: No respiratory distress.     Breath sounds: No stridor. No wheezing.  Abdominal:     General: Bowel sounds are normal. There is no distension.     Palpations: Abdomen is soft. There is no mass.     Tenderness: There is no abdominal tenderness. There is no guarding or rebound.  Musculoskeletal:        General: Tenderness present.      Cervical back: Normal range of motion and neck supple. No rigidity.     Right lower leg: No edema.     Left lower leg: No edema.  Lymphadenopathy:     Cervical: No cervical adenopathy.  Skin:    Findings: No erythema or rash.  Neurological:     Mental Status: She is oriented to person, place, and time.     Cranial Nerves: No cranial nerve deficit.     Motor: No abnormal muscle tone.     Coordination: Coordination normal.     Deep Tendon Reflexes: Reflexes normal.  Psychiatric:        Behavior: Behavior normal.        Thought Content: Thought content normal.        Judgment: Judgment normal.    LS spine w/pain L shoulder w/pain   Procedure :Joint Injection, L  shoulder   Indication:  Subacromial bursitis with refractory  chronic pain.   Risks including unsuccessful procedure , bleeding, infection, bruising, skin atrophy, "steroid flare-up" and others were explained to the patient in detail as well as the benefits. Informed consent was obtained and signed.   Tthe patient was placed in a comfortable position. Lateral approach was used. Skin was prepped with Betadine and alcohol  and anesthetized with a cooling spray. Then, a 5 cc syringe with a 2 inch long 24-gauge needle was used for a joint injection.. The needle was advanced  Into the subacromial space.The bursa was injected with 3 mL of 2% lidocaine and 40 mg of Depo-Medrol .  Band-Aid was applied.   Tolerated well. Complications: None. Good pain relief following the procedure.   Postprocedure instructions :    A Band-Aid should be left on for 12 hours. Injection therapy is not a cure itself. It is used in conjunction with other modalities. You can use nonsteroidal anti-inflammatories like ibuprofen , hot and cold compresses. Rest is recommended in the next 24 hours. You need to report immediately  if fever, chills or any signs of infection develop.  Lab Results  Component Value Date   WBC 6.7 04/08/2022   HGB 11.1 (L)  04/08/2022   HCT 32.9 (L) 04/08/2022   PLT 275.0 04/08/2022   GLUCOSE 92 04/08/2022   CHOL 148 12/16/2019   TRIG 71 12/16/2019   HDL 70 12/16/2019   LDLDIRECT 160.7 02/06/2012   LDLCALC 63 12/16/2019   ALT 14 04/08/2022   AST 24 04/08/2022   NA 138 04/08/2022   K 4.2 04/08/2022   CL 102 04/08/2022   CREATININE 1.29 (H) 04/08/2022  BUN 21 04/08/2022   CO2 27 04/08/2022   TSH 1.65 09/16/2021   INR 1.4 (H) 12/08/2021   HGBA1C 5.5 09/16/2021    MM Digital Diagnostic Unilat L  Result Date: 06/26/2022 CLINICAL DATA:  62 year old female presenting as a recall from screening for left breast calcifications. EXAM: DIGITAL DIAGNOSTIC UNILATERAL LEFT MAMMOGRAM WITH CAD TECHNIQUE: Left digital diagnostic mammography was performed. COMPARISON:  Previous exam(s). ACR Breast Density Category b: There are scattered areas of fibroglandular density. FINDINGS: Spot 2D magnification views and full true lateral views of the left breast were performed demonstrating persistence of a faint group of coarse calcifications in the upper outer left breast which are similar in appearance to multiple other scattered groups of calcifications in the left breast. There is no associated mass or distortion. These span approximately 0.4 cm. IMPRESSION: Probably benign calcifications spanning 0.4 cm in the upper outer left breast. RECOMMENDATION: Diagnostic left breast mammogram in 6 months. I have discussed the findings and recommendations with the patient who agrees to short-term follow-up. If applicable, a reminder letter will be sent to the patient regarding the next appointment. BI-RADS CATEGORY  3: Probably benign. Electronically Signed   By: Emmaline Kluver M.D.   On: 06/26/2022 15:40   Assessment & Plan:   Problem List Items Addressed This Visit     Essential hypertension - Primary    On Furosemide, KCl      Vitamin D deficiency    Cont w/Vit D      Low back pain radiating down leg    Cont w/Norco prn   Potential benefits of a long term opioids use as well as potential risks (i.e. addiction risk, apnea etc) and complications (i.e. Somnolence, constipation and others) were explained to the patient and were aknowledged.      Retinal vasculitis    Labs w/her ophthalmologist q 3 months      Shoulder pain, left    Pt requested injection See Procedure         No orders of the defined types were placed in this encounter.     Follow-up: Return in about 3 months (around 01/20/2023) for a follow-up visit.  Sonda Primes, MD

## 2022-10-28 ENCOUNTER — Other Ambulatory Visit: Payer: Self-pay | Admitting: Internal Medicine

## 2022-11-19 ENCOUNTER — Telehealth: Payer: Self-pay | Admitting: Internal Medicine

## 2022-11-19 DIAGNOSIS — M159 Polyosteoarthritis, unspecified: Secondary | ICD-10-CM

## 2022-11-19 MED ORDER — HYDROCODONE-ACETAMINOPHEN 5-325 MG PO TABS
1.0000 | ORAL_TABLET | Freq: Three times a day (TID) | ORAL | 0 refills | Status: DC | PRN
Start: 2022-11-19 — End: 2022-12-18

## 2022-11-19 NOTE — Telephone Encounter (Signed)
Prescription Request  11/19/2022  LOV: 10/20/2022  What is the name of the medication or equipment? HYDROcodone-acetaminophen (NORCO/VICODIN) 5-325 MG tablet   Have you contacted your pharmacy to request a refill? No   Which pharmacy would you like this sent to?     [id number] - [place], [place] - [address] [place]  [address] Phone: [phone] Fax: [phone]  Pt notified that their request is being sent to the clinical staff for review and that they should receive a response within 2 business days.   Please advise at Mobile [phone] (mobile)

## 2022-11-19 NOTE — Telephone Encounter (Signed)
Okay.  Thanks.

## 2022-12-02 ENCOUNTER — Encounter: Payer: Self-pay | Admitting: Internal Medicine

## 2022-12-02 ENCOUNTER — Ambulatory Visit: Payer: BC Managed Care – PPO | Admitting: Internal Medicine

## 2022-12-02 VITALS — BP 110/70 | HR 74 | Temp 98.6°F | Ht 65.0 in | Wt 152.0 lb

## 2022-12-02 DIAGNOSIS — M545 Low back pain, unspecified: Secondary | ICD-10-CM | POA: Diagnosis not present

## 2022-12-02 DIAGNOSIS — E7800 Pure hypercholesterolemia, unspecified: Secondary | ICD-10-CM | POA: Diagnosis not present

## 2022-12-02 DIAGNOSIS — I10 Essential (primary) hypertension: Secondary | ICD-10-CM

## 2022-12-02 DIAGNOSIS — M79606 Pain in leg, unspecified: Secondary | ICD-10-CM

## 2022-12-02 DIAGNOSIS — R0989 Other specified symptoms and signs involving the circulatory and respiratory systems: Secondary | ICD-10-CM | POA: Diagnosis not present

## 2022-12-02 NOTE — Progress Notes (Signed)
Subjective:  Patient ID: Jessica Lynn, female    DOB: Apr 02, 1961  Age: 62 y.o. MRN: [id number]  CC: Follow-up   HPI LACIA DOBSON presents for pre-op [place], [place]) R eye surgery at [facility] Pt needs a carotid test for L bruit Follow-up on low back pain  Outpatient Medications Prior to Visit  Medication Sig Dispense Refill   Cholecalciferol (VITAMIN D3) 2000 units capsule Take 1 capsule (2,000 Units total) by mouth daily. 100 capsule 3   dorzolamide-timolol (COSOPT) 22.3-6.8 MG/ML ophthalmic solution Place 1 drop into the right eye 2 (two) times daily.     ferrous sulfate (FEROSUL) 325 (65 FE) MG tablet TAKE 1 TABLET(325 MG) BY MOUTH DAILY 30 tablet 5   folic acid (FOLVITE) 1 MG tablet TAKE 1 TABLET(1 MG) BY MOUTH DAILY 100 tablet 3   furosemide (LASIX) 40 MG tablet TAKE 1 TABLET BY MOUTH EVERY DAY 90 tablet 1   HYDROcodone-acetaminophen (NORCO/VICODIN) 5-325 MG tablet Take 1 tablet by mouth 3 (three) times daily as needed for severe pain. 90 tablet 0   hydrOXYzine (ATARAX/VISTARIL) 25 MG tablet TAKE 1 TABLET BY MOUTH EVERY 8 HOURS AS NEEDED FOR ITCHING (Patient taking differently: Take 25 mg by mouth daily as needed for itching.) 270 tablet 1   KLOR-CON M20 20 MEQ tablet TAKE 1 TABLET BY MOUTH EVERY DAY 90 tablet 1   montelukast (SINGULAIR) 10 MG tablet TAKE 1 TABLET(10 MG) BY MOUTH DAILY 90 tablet 3   prednisoLONE acetate (PRED FORTE) 1 % ophthalmic suspension 1 drop 2 (two) times daily.     Zinc Sulfate 220 (50 Zn) MG TABS Take 1 tablet (220 mg total) by mouth daily. 30 tablet 0   zolpidem (AMBIEN) 10 MG tablet TAKE 1 TABLET(10 MG) BY MOUTH AT BEDTIME AS NEEDED FOR SLEEP 30 tablet 3   No facility-administered medications prior to visit.    ROS: Review of Systems  Constitutional:  Negative for activity change, appetite change, chills, fatigue and unexpected weight change.  HENT:  Negative for congestion, mouth sores and sinus pressure.   Eyes:  Negative for visual disturbance.   Respiratory:  Negative for cough and chest tightness.   Gastrointestinal:  Negative for abdominal pain and nausea.  Genitourinary:  Negative for difficulty urinating, frequency and vaginal pain.  Musculoskeletal:  Negative for back pain and gait problem.  Skin:  Negative for pallor and rash.  Neurological:  Negative for dizziness, tremors, weakness, numbness and headaches.  Psychiatric/Behavioral:  Negative for confusion and sleep disturbance.     Objective:  BP 110/70 (BP Location: Left Arm, Patient Position: Sitting, Cuff Size: Large)   Pulse 74   Temp 98.6 F (37 C) (Oral)   Ht 5\' 5"  (1.651 m)   Wt 152 lb (68.9 kg)   LMP 06/06/2015 Comment: irregular   SpO2 99%   BMI 25.29 kg/m   BP Readings from Last 3 Encounters:  12/02/22 110/70  10/20/22 130/80  08/06/22 (!) 158/86    Wt Readings from Last 3 Encounters:  12/02/22 152 lb (68.9 kg)  10/20/22 151 lb (68.5 kg)  08/06/22 155 lb (70.3 kg)    Physical Exam Constitutional:      General: She is not in acute distress.    Appearance: Normal appearance. She is well-developed.  HENT:     Head: Normocephalic.     Right Ear: External ear normal.     Left Ear: External ear normal.     Nose: Nose normal.  Eyes:  General:        Right eye: No discharge.        Left eye: No discharge.     Conjunctiva/sclera: Conjunctivae normal.     Pupils: Pupils are equal, round, and reactive to light.  Neck:     Thyroid: No thyromegaly.     Vascular: No JVD.     Trachea: No tracheal deviation.  Cardiovascular:     Rate and Rhythm: Normal rate and regular rhythm.     Heart sounds: Normal heart sounds.  Pulmonary:     Effort: No respiratory distress.     Breath sounds: No stridor. No wheezing.  Abdominal:     General: Bowel sounds are normal. There is no distension.     Palpations: Abdomen is soft. There is no mass.     Tenderness: There is no abdominal tenderness. There is no guarding or rebound.  Musculoskeletal:         General: No tenderness.     Cervical back: Normal range of motion and neck supple. No rigidity.  Lymphadenopathy:     Cervical: No cervical adenopathy.  Skin:    Findings: No erythema or rash.  Neurological:     Cranial Nerves: No cranial nerve deficit.     Motor: No abnormal muscle tone.     Coordination: Coordination normal.     Deep Tendon Reflexes: Reflexes normal.  Psychiatric:        Behavior: Behavior normal.        Thought Content: Thought content normal.        Judgment: Judgment normal.     Lab Results  Component Value Date   WBC 6.7 04/08/2022   HGB 11.1 (L) 04/08/2022   HCT 32.9 (L) 04/08/2022   PLT 275.0 04/08/2022   GLUCOSE 92 04/08/2022   CHOL 148 12/16/2019   TRIG 71 12/16/2019   HDL 70 12/16/2019   LDLDIRECT 160.7 02/06/2012   LDLCALC 63 12/16/2019   ALT 14 04/08/2022   AST 24 04/08/2022   NA 138 04/08/2022   K 4.2 04/08/2022   CL 102 04/08/2022   CREATININE 1.29 (H) 04/08/2022   BUN 21 04/08/2022   CO2 27 04/08/2022   TSH 1.65 09/16/2021   INR 1.4 (H) 12/08/2021   HGBA1C 5.5 09/16/2021    MM Digital Diagnostic Unilat L  Result Date: 06/26/2022 CLINICAL DATA:  62 year old female presenting as a recall from screening for left breast calcifications. EXAM: DIGITAL DIAGNOSTIC UNILATERAL LEFT MAMMOGRAM WITH CAD TECHNIQUE: Left digital diagnostic mammography was performed. COMPARISON:  Previous exam(s). ACR Breast Density Category b: There are scattered areas of fibroglandular density. FINDINGS: Spot 2D magnification views and full true lateral views of the left breast were performed demonstrating persistence of a faint group of coarse calcifications in the upper outer left breast which are similar in appearance to multiple other scattered groups of calcifications in the left breast. There is no associated mass or distortion. These span approximately 0.4 cm. IMPRESSION: Probably benign calcifications spanning 0.4 cm in the upper outer left breast.  RECOMMENDATION: Diagnostic left breast mammogram in 6 months. I have discussed the findings and recommendations with the patient who agrees to short-term follow-up. If applicable, a reminder letter will be sent to the patient regarding the next appointment. BI-RADS CATEGORY  3: Probably benign. Electronically Signed   By: Emmaline Kluver M.D.   On: 06/26/2022 15:40   Assessment & Plan:   Problem List Items Addressed This Visit     HYPERCHOLESTEROLEMIA  Cramps are better Switch to Pravachol      Essential hypertension    On Furosemide, KCl      Low back pain radiating down leg    Cont w/Norco prn  Potential benefits of a long term opioids use as well as potential risks (i.e. addiction risk, apnea etc) and complications (i.e. Somnolence, constipation and others) were explained to the patient and were aknowledged.      Carotid bruit - Primary    Mild B detected during pre-op exam Dolores Patty, [place]) R eye surgery at [facility] Pt needs a carotid test for L bruit -ordered.      Relevant Orders   US Carotid Bilateral      No orders of the defined types were placed in this encounter.     Follow-up: No follow-ups on file.  Sonda Primes, MD

## 2022-12-02 NOTE — Assessment & Plan Note (Signed)
 On Furosemide, KCl

## 2022-12-02 NOTE — Assessment & Plan Note (Addendum)
Mild B detected during pre-op exam Jessica Lynn, [place]) R eye surgery at [facility] Pt needs a carotid test for L bruit -ordered.

## 2022-12-02 NOTE — Assessment & Plan Note (Signed)
Cramps are better Switch to Pravachol

## 2022-12-09 ENCOUNTER — Encounter: Payer: Self-pay | Admitting: Internal Medicine

## 2022-12-09 NOTE — Assessment & Plan Note (Signed)
Cont w/Norco prn  Potential benefits of a long term opioids use as well as potential risks (i.e. addiction risk, apnea etc) and complications (i.e. Somnolence, constipation and others) were explained to the patient and were aknowledged. 

## 2022-12-16 ENCOUNTER — Other Ambulatory Visit: Payer: Self-pay | Admitting: Internal Medicine

## 2022-12-17 ENCOUNTER — Telehealth: Payer: Self-pay | Admitting: Internal Medicine

## 2022-12-17 DIAGNOSIS — M159 Polyosteoarthritis, unspecified: Secondary | ICD-10-CM

## 2022-12-17 NOTE — Telephone Encounter (Signed)
Patient was supposed to have a ultrasound on her carotid artery - she has not heard anything from them.  Please advise.

## 2022-12-17 NOTE — Telephone Encounter (Signed)
Prescription Request  12/17/2022  LOV: 12/02/2022  What is the name of the medication or equipment? hydrocodone  Have you contacted your pharmacy to request a refill? Yes   Which pharmacy would you like this sent to?  [id number] - [place], [place] - [address] [place] [address] Phone: [phone] Fax: [phone]   Patient notified that their request is being sent to the clinical staff for review and that they should receive a response within 2 business days.   Please advise at Mobile [phone] (mobile)

## 2022-12-18 ENCOUNTER — Other Ambulatory Visit: Payer: Self-pay

## 2022-12-18 DIAGNOSIS — R0989 Other specified symptoms and signs involving the circulatory and respiratory systems: Secondary | ICD-10-CM

## 2022-12-18 MED ORDER — HYDROCODONE-ACETAMINOPHEN 5-325 MG PO TABS
1.0000 | ORAL_TABLET | Freq: Three times a day (TID) | ORAL | 0 refills | Status: DC | PRN
Start: 2022-12-18 — End: 2023-01-16

## 2022-12-18 NOTE — Telephone Encounter (Signed)
Okay. Thank you.

## 2022-12-18 NOTE — Telephone Encounter (Signed)
Had to go into the patients chart and fix the order so that heart care could see the order and call the patient to schedule it. Patient is aware that her order has been changed and someone will be giving her a call shortly to schedule her for her [place]

## 2022-12-29 ENCOUNTER — Ambulatory Visit (HOSPITAL_COMMUNITY)
Admission: RE | Admit: 2022-12-29 | Discharge: 2022-12-29 | Disposition: A | Discharge status: Home / Self Care | Payer: BC Managed Care – PPO | Source: Ambulatory Visit | Attending: Cardiology | Admitting: Cardiology

## 2022-12-29 DIAGNOSIS — R0989 Other specified symptoms and signs involving the circulatory and respiratory systems: Secondary | ICD-10-CM | POA: Insufficient documentation

## 2022-12-30 ENCOUNTER — Other Ambulatory Visit: Payer: Self-pay | Admitting: Internal Medicine

## 2022-12-30 DIAGNOSIS — I6529 Occlusion and stenosis of unspecified carotid artery: Secondary | ICD-10-CM | POA: Insufficient documentation

## 2022-12-30 DIAGNOSIS — I6522 Occlusion and stenosis of left carotid artery: Secondary | ICD-10-CM

## 2022-12-30 MED ORDER — FISH OIL 1000 MG PO CAPS: 2.0000 | ORAL_CAPSULE | Freq: Every day | ORAL | Status: DC

## 2022-12-30 MED ORDER — ASPIRIN 81 MG PO TBEC: 81.0000 mg | DELAYED_RELEASE_TABLET | Freq: Every day | ORAL | Status: DC

## 2022-12-31 ENCOUNTER — Ambulatory Visit
Admission: RE | Admit: 2022-12-31 | Discharge: 2022-12-31 | Disposition: A | Discharge status: Home / Self Care | Payer: BC Managed Care – PPO | Source: Ambulatory Visit | Attending: Internal Medicine | Admitting: Internal Medicine

## 2022-12-31 DIAGNOSIS — R921 Mammographic calcification found on diagnostic imaging of breast: Secondary | ICD-10-CM

## 2022-12-31 DIAGNOSIS — R928 Other abnormal and inconclusive findings on diagnostic imaging of breast: Secondary | ICD-10-CM

## 2023-01-01 ENCOUNTER — Other Ambulatory Visit: Payer: Self-pay | Admitting: Internal Medicine

## 2023-01-01 DIAGNOSIS — R921 Mammographic calcification found on diagnostic imaging of breast: Secondary | ICD-10-CM

## 2023-01-15 ENCOUNTER — Telehealth: Payer: Self-pay | Admitting: Internal Medicine

## 2023-01-15 DIAGNOSIS — M150 Primary generalized (osteo)arthritis: Secondary | ICD-10-CM

## 2023-01-15 NOTE — Telephone Encounter (Signed)
Prescription Request  01/15/2023  LOV: 12/02/2022  What is the name of the medication or equipment? HYDROcodone-acetaminophen (NORCO/VICODIN) 5-325 MG tablet   Have you contacted your pharmacy to request a refill? No   Which pharmacy would you like this sent to?   [id number] - [place], [place] - [address] [place] [address] Phone: [phone] Fax: [phone]  Patient notified that their request is being sent to the clinical staff for review and that they should receive a response within 2 business days.   Please advise at Mobile [phone] (mobile)

## 2023-01-16 MED ORDER — HYDROCODONE-ACETAMINOPHEN 5-325 MG PO TABS: 1.0000 | ORAL_TABLET | Freq: Three times a day (TID) | ORAL | 0 refills | Status: DC | PRN

## 2023-01-16 NOTE — Telephone Encounter (Signed)
Okay.  Thanks.

## 2023-01-21 ENCOUNTER — Ambulatory Visit: Payer: BC Managed Care – PPO | Admitting: Internal Medicine

## 2023-01-30 ENCOUNTER — Encounter: Payer: Self-pay | Admitting: Internal Medicine

## 2023-01-30 ENCOUNTER — Ambulatory Visit: Payer: BC Managed Care – PPO | Admitting: Internal Medicine

## 2023-01-30 VITALS — BP 120/72 | HR 76 | Temp 98.9°F | Ht 65.0 in | Wt 145.5 lb

## 2023-01-30 DIAGNOSIS — F419 Anxiety disorder, unspecified: Secondary | ICD-10-CM | POA: Diagnosis not present

## 2023-01-30 DIAGNOSIS — M150 Primary generalized (osteo)arthritis: Secondary | ICD-10-CM

## 2023-01-30 DIAGNOSIS — F4321 Adjustment disorder with depressed mood: Secondary | ICD-10-CM | POA: Insufficient documentation

## 2023-01-30 DIAGNOSIS — H109 Unspecified conjunctivitis: Secondary | ICD-10-CM | POA: Insufficient documentation

## 2023-01-30 DIAGNOSIS — M79606 Pain in leg, unspecified: Secondary | ICD-10-CM

## 2023-01-30 DIAGNOSIS — M545 Low back pain, unspecified: Secondary | ICD-10-CM

## 2023-01-30 DIAGNOSIS — H1032 Unspecified acute conjunctivitis, left eye: Secondary | ICD-10-CM

## 2023-01-30 DIAGNOSIS — I10 Essential (primary) hypertension: Secondary | ICD-10-CM | POA: Diagnosis not present

## 2023-01-30 MED ORDER — ZOLPIDEM TARTRATE 10 MG PO TABS: ORAL_TABLET | ORAL | 3 refills | Status: DC

## 2023-01-30 MED ORDER — POLYMYXIN B-TRIMETHOPRIM 10000-0.1 UNIT/ML-% OP SOLN
2.0000 [drp] | OPHTHALMIC | 0 refills | Status: AC
Start: 2023-01-30 — End: 2023-02-06

## 2023-01-30 NOTE — Progress Notes (Signed)
Subjective:  Patient ID: Jessica Lynn, female    DOB: 02/20/1961  Age: 62 y.o. MRN: [id number]  CC: Medical Management of Chronic Issues ( f/u appt, patient thinks she has pink eye ( left eye, painful, discharge in the morning )   HPI Jessica Lynn presents for LBP/OA, insomnia Father died last Feb 28, 2023 (27-Jan-2023)  Patient thinks she has pink eye ( left eye, painful, discharge in the morning )  Outpatient Medications Prior to Visit  Medication Sig Dispense Refill   aspirin EC 81 MG tablet Take 1 tablet (81 mg total) by mouth daily.     Cholecalciferol (VITAMIN D3) 2000 units capsule Take 1 capsule (2,000 Units total) by mouth daily. 100 capsule 3   dorzolamide-timolol (COSOPT) 22.3-6.8 MG/ML ophthalmic solution Place 1 drop into the right eye 2 (two) times daily.     ferrous sulfate (FEROSUL) 325 (65 FE) MG tablet TAKE 1 TABLET(325 MG) BY MOUTH DAILY 30 tablet 5   folic acid (FOLVITE) 1 MG tablet TAKE 1 TABLET(1 MG) BY MOUTH DAILY 100 tablet 3   furosemide (LASIX) 40 MG tablet TAKE 1 TABLET BY MOUTH EVERY DAY 90 tablet 1   HYDROcodone-acetaminophen (NORCO/VICODIN) 5-325 MG tablet Take 1 tablet by mouth 3 (three) times daily as needed for severe pain. 90 tablet 0   hydrOXYzine (ATARAX/VISTARIL) 25 MG tablet TAKE 1 TABLET BY MOUTH EVERY 8 HOURS AS NEEDED FOR ITCHING (Patient taking differently: Take 25 mg by mouth daily as needed for itching.) 270 tablet 1   KLOR-CON M20 20 MEQ tablet TAKE 1 TABLET BY MOUTH EVERY DAY 90 tablet 1   montelukast (SINGULAIR) 10 MG tablet TAKE 1 TABLET(10 MG) BY MOUTH DAILY 90 tablet 3   Omega-3 Fatty Acids (FISH OIL) 1000 MG CAPS Take 2 capsules (2,000 mg total) by mouth daily.     prednisoLONE acetate (PRED FORTE) 1 % ophthalmic suspension 1 drop 2 (two) times daily.     Zinc Sulfate 220 (50 Zn) MG TABS Take 1 tablet (220 mg total) by mouth daily. 30 tablet 0   zolpidem (AMBIEN) 10 MG tablet TAKE 1 TABLET(10 MG) BY MOUTH AT BEDTIME AS NEEDED FOR SLEEP  30 tablet 3   No facility-administered medications prior to visit.    ROS: Review of Systems  Constitutional:  Positive for fatigue. Negative for activity change, appetite change, chills and unexpected weight change.  HENT:  Negative for congestion, mouth sores and sinus pressure.   Eyes:  Positive for pain and redness. Negative for visual disturbance.  Respiratory:  Negative for cough and chest tightness.   Gastrointestinal:  Negative for abdominal pain and nausea.  Genitourinary:  Negative for difficulty urinating, frequency and vaginal pain.  Musculoskeletal:  Positive for arthralgias and back pain. Negative for gait problem.  Skin:  Negative for pallor and rash.  Neurological:  Negative for dizziness, tremors, weakness, numbness and headaches.  Psychiatric/Behavioral:  Positive for dysphoric mood and sleep disturbance. Negative for confusion and suicidal ideas. The patient is nervous/anxious.     Objective:  BP 120/72   Pulse 76   Temp 98.9 F (37.2 C) (Oral)   Ht 5\' 5"  (1.651 m)   Wt 145 lb 8 oz (66 kg)   LMP 06/06/2015 Comment: irregular   SpO2 100%   BMI 24.21 kg/m   BP Readings from Last 3 Encounters:  01/30/23 120/72  12/02/22 110/70  10/20/22 130/80    Wt Readings from Last 3 Encounters:  01/30/23 145 lb 8 oz (66 kg)  12/02/22 152 lb (68.9 kg)  10/20/22 151 lb (68.5 kg)    Physical Exam Constitutional:      General: She is not in acute distress.    Appearance: She is well-developed.  HENT:     Head: Normocephalic.     Right Ear: External ear normal.     Left Ear: External ear normal.     Nose: Nose normal.  Eyes:     General:        Right eye: No discharge.        Left eye: No discharge.     Conjunctiva/sclera: Conjunctivae normal.     Pupils: Pupils are equal, round, and reactive to light.  Neck:     Thyroid: No thyromegaly.     Vascular: No JVD.     Trachea: No tracheal deviation.  Cardiovascular:     Rate and Rhythm: Normal rate and regular  rhythm.     Heart sounds: Normal heart sounds.  Pulmonary:     Effort: No respiratory distress.     Breath sounds: No stridor. No wheezing.  Abdominal:     General: Bowel sounds are normal. There is no distension.     Palpations: Abdomen is soft. There is no mass.     Tenderness: There is no abdominal tenderness. There is no guarding or rebound.  Musculoskeletal:        General: Tenderness present.     Cervical back: Normal range of motion and neck supple. No rigidity.  Lymphadenopathy:     Cervical: No cervical adenopathy.  Skin:    Findings: No erythema or rash.  Neurological:     Cranial Nerves: No cranial nerve deficit.     Motor: No abnormal muscle tone.     Coordination: Coordination normal.     Deep Tendon Reflexes: Reflexes normal.  Psychiatric:        Behavior: Behavior normal.        Thought Content: Thought content normal.        Judgment: Judgment normal.    LS w/pain L eye w/conjunctivitis  Lab Results  Component Value Date   WBC 6.7 04/08/2022   HGB 11.1 (L) 04/08/2022   HCT 32.9 (L) 04/08/2022   PLT 275.0 04/08/2022   GLUCOSE 92 04/08/2022   CHOL 148 12/16/2019   TRIG 71 12/16/2019   HDL 70 12/16/2019   LDLDIRECT 160.7 02/06/2012   LDLCALC 63 12/16/2019   ALT 14 04/08/2022   AST 24 04/08/2022   NA 138 04/08/2022   K 4.2 04/08/2022   CL 102 04/08/2022   CREATININE 1.29 (H) 04/08/2022   BUN 21 04/08/2022   CO2 27 04/08/2022   TSH 1.65 09/16/2021   INR 1.4 (H) 12/08/2021   HGBA1C 5.5 09/16/2021    MM Digital Diagnostic Unilat L  Result Date: 12/31/2022 CLINICAL DATA:  Six-month interval follow-up of likely benign calcifications involving the outer LEFT breast at middle depth. EXAM: DIGITAL DIAGNOSTIC UNILATERAL LEFT MAMMOGRAM WITH CAD TECHNIQUE: Left digital diagnostic mammography was performed. COMPARISON:  Previous exam(s). ACR Breast Density Category b: There are scattered areas of fibroglandular density. FINDINGS: Full field CC and MLO views  and spot magnification CC and mediolateral views of the calcifications obtained. The 3 mm group of calcifications in the outer breast at middle depth are unchanged. There are no new suspicious linear or branching forms. No new or suspicious findings elsewhere. IMPRESSION: Stable likely benign 3 mm group of calcifications in the outer LEFT breast at middle depth. RECOMMENDATION: Annual  BILATERAL diagnostic mammography in 6 months (to include spot magnification views of the LEFT breast calcifications). I have discussed the findings and recommendations with the patient. If applicable, a reminder letter will be sent to the patient regarding the next appointment. BI-RADS CATEGORY  3: Probably benign. Electronically Signed   By: Hulan Saas M.D.   On: 12/31/2022 16:23    Assessment & Plan:   Problem List Items Addressed This Visit     Essential hypertension    On Furosemide, KCl      Low back pain radiating down leg    Cont w/Norco prn  Potential benefits of a long term opioids use as well as potential risks (i.e. addiction risk, apnea etc) and complications (i.e. Somnolence, constipation and others) were explained to the patient and were aknowledged.      Anxiety disorder     Cont w/Clonazepam prn  Potential benefits of a long term benzodiazepines  use as well as potential risks  and complications were explained to the patient and were aknowledged. Not to take w/Norco      Primary osteoarthritis involving multiple joints   Grief - Primary    Father died last March 14, 2023 (02/10/2023) Discussed      Conjunctivitis    Polytrim eye gtt Rx - L eye         Meds ordered this encounter  Medications   trimethoprim-polymyxin b (POLYTRIM) ophthalmic solution    Sig: Place 2 drops into the left eye every 4 (four) hours for 7 days.    Dispense:  10 mL    Refill:  0   zolpidem (AMBIEN) 10 MG tablet    Sig: TAKE 1 TABLET(10 MG) BY MOUTH AT BEDTIME AS NEEDED FOR SLEEP    Dispense:  30 tablet     Refill:  3      Follow-up: Return in about 3 months (around 05/02/2023) for a follow-up visit.  Sonda Primes, MD

## 2023-01-30 NOTE — Assessment & Plan Note (Signed)
Cont w/Norco prn  Potential benefits of a long term opioids use as well as potential risks (i.e. addiction risk, apnea etc) and complications (i.e. Somnolence, constipation and others) were explained to the patient and were aknowledged. 

## 2023-01-30 NOTE — Patient Instructions (Signed)
Blue-Emu cream -- use 2-3 times a day ? ?

## 2023-01-30 NOTE — Assessment & Plan Note (Signed)
Polytrim eye gtt Rx - L eye

## 2023-01-30 NOTE — Assessment & Plan Note (Signed)
Father died last Thursday (01/2023) Discussed

## 2023-01-30 NOTE — Assessment & Plan Note (Signed)
 On Furosemide, KCl

## 2023-01-30 NOTE — Assessment & Plan Note (Signed)
  Cont w/Clonazepam prn  Potential benefits of a long term benzodiazepines  use as well as potential risks  and complications were explained to the patient and were aknowledged. Not to take w/Norco

## 2023-01-31 ENCOUNTER — Other Ambulatory Visit: Payer: Self-pay | Admitting: Internal Medicine

## 2023-02-13 ENCOUNTER — Telehealth: Payer: Self-pay | Admitting: Internal Medicine

## 2023-02-13 DIAGNOSIS — M150 Primary generalized (osteo)arthritis: Secondary | ICD-10-CM

## 2023-02-13 NOTE — Telephone Encounter (Signed)
Prescription Request  02/13/2023  LOV: 01/30/2023  What is the name of the medication or equipment? HYDROcodone-acetaminophen (NORCO/VICODIN) 5-325 MG tablet   Have you contacted your pharmacy to request a refill? No   Which pharmacy would you like this sent to?     [id number] - [place], [place] - [address] [place] [address] Phone: [phone] Fax: [phone]  Patient notified that their request is being sent to the clinical staff for review and that they should receive a response within 2 business days.   Please advise at Mobile [phone] (mobile)

## 2023-02-17 MED ORDER — HYDROCODONE-ACETAMINOPHEN 5-325 MG PO TABS: 1.0000 | ORAL_TABLET | Freq: Three times a day (TID) | ORAL | 0 refills | Status: DC | PRN

## 2023-03-18 ENCOUNTER — Telehealth: Payer: Self-pay | Admitting: Internal Medicine

## 2023-03-18 DIAGNOSIS — M150 Primary generalized (osteo)arthritis: Secondary | ICD-10-CM

## 2023-03-18 NOTE — Telephone Encounter (Signed)
Next OV is 04/22/2023.   Prescription Request  03/18/2023  LOV: 01/30/2023  What is the name of the medication or equipment?  HYDROcodone-acetaminophen (NORCO/VICODIN) 5-325 MG tablet   Have you contacted your pharmacy to request a refill? No   Which pharmacy would you like this sent to?  [id number] - [place], [place] - [address] [place] [address] Phone: [phone] Fax: [phone]    Patient notified that their request is being sent to the clinical staff for review and that they should receive a response within 2 business days.   Please advise at Mobile [phone] (mobile)

## 2023-03-22 MED ORDER — HYDROCODONE-ACETAMINOPHEN 5-325 MG PO TABS: 1.0000 | ORAL_TABLET | Freq: Three times a day (TID) | ORAL | 0 refills | Status: DC | PRN

## 2023-03-22 NOTE — Telephone Encounter (Signed)
Okay.  Done.  Thanks 

## 2023-04-08 IMAGING — MG MM DIGITAL SCREENING BILAT W/ TOMO AND CAD
8 series · 8 of 24 positions shown · non-contrast
Comparison: Previous exam(s).

ACR Breast Density Category a: The breast tissue is almost entirely
fatty.

CLINICAL DATA: Screening.

EXAM:
DIGITAL SCREENING BILATERAL MAMMOGRAM WITH TOMOSYNTHESIS AND CAD
TECHNIQUE: Bilateral screening digital craniocaudal and mediolateral oblique
mammograms were obtained. Bilateral screening digital breast
tomosynthesis was performed. The images were evaluated with
computer-aided detection.

[R MLO synth-2D]
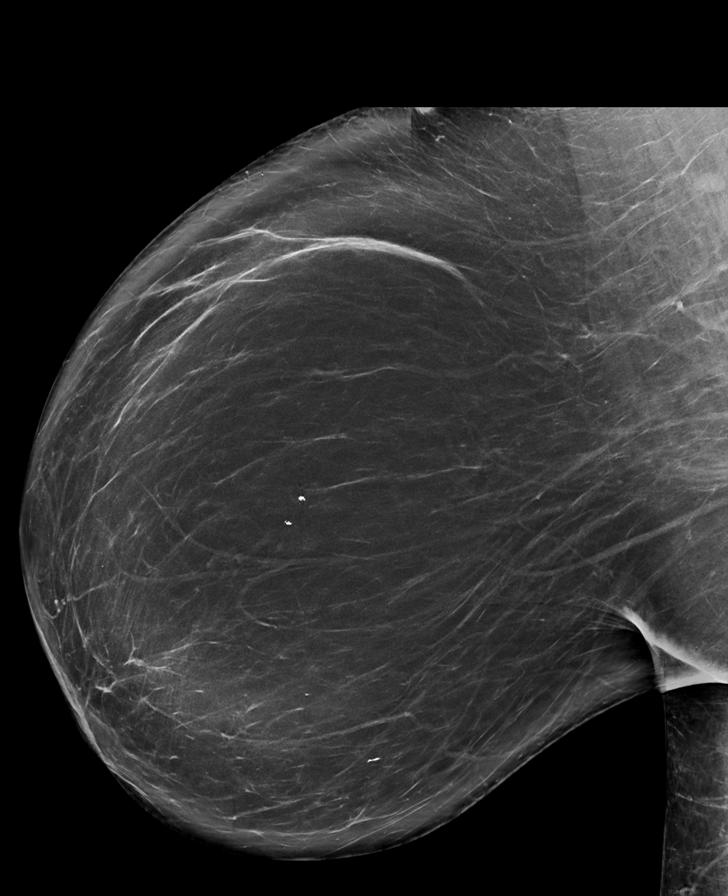

[L CC synth-2D]
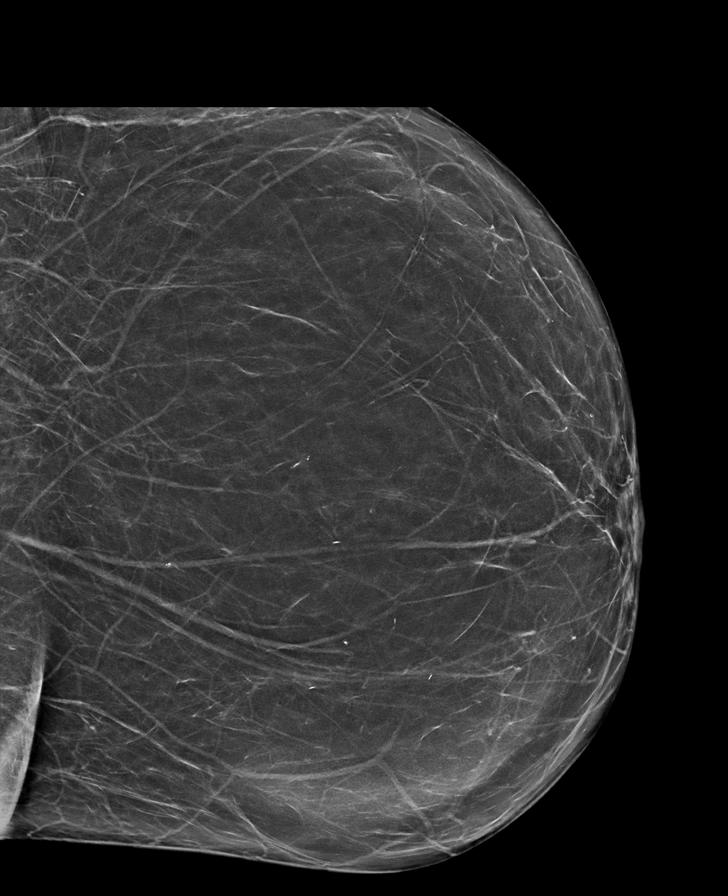

[R CC synth-2D]
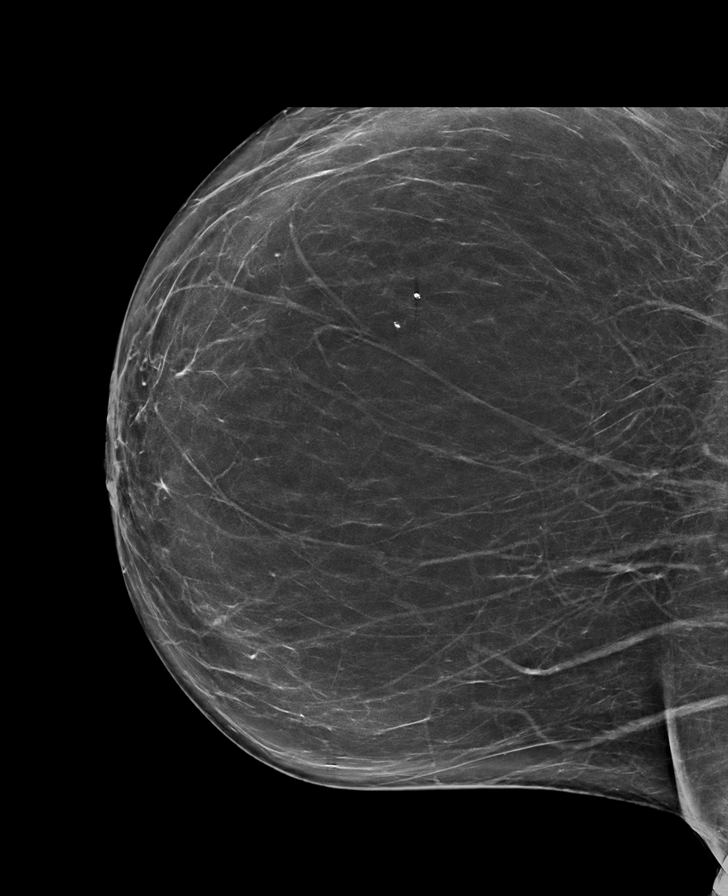

[L MLO synth-2D]
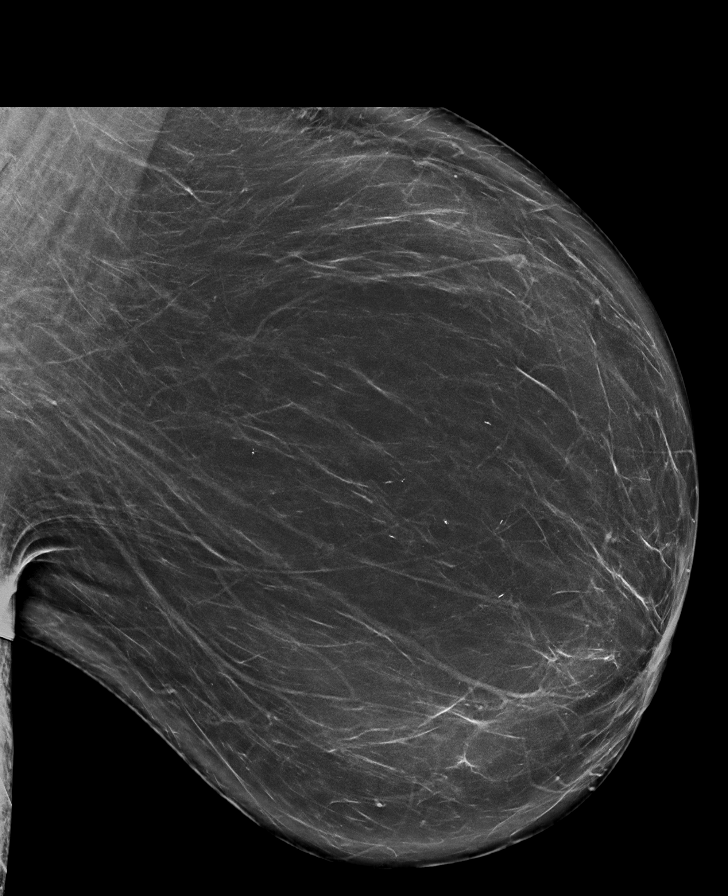

[R CC tomo · tomo slice 41/81.0]
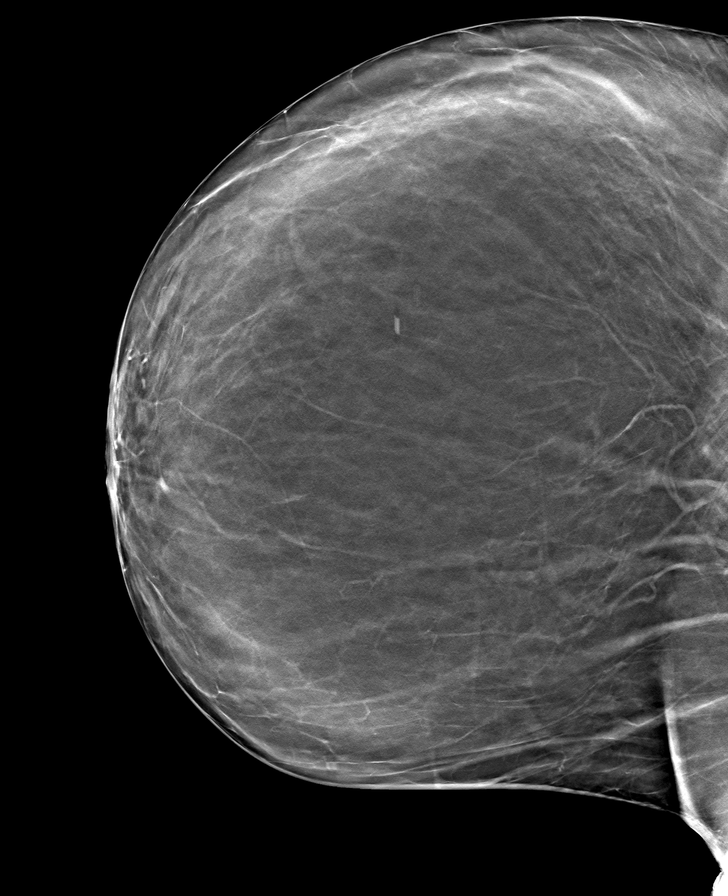

[R MLO tomo · tomo slice 49/98.0]
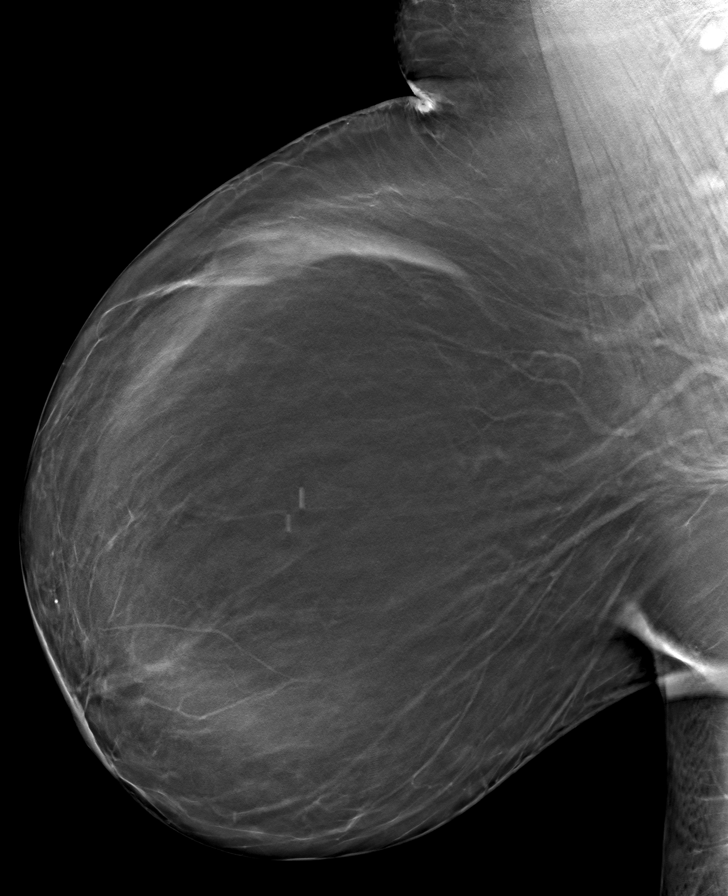

[L MLO tomo · tomo slice 47/92.0]
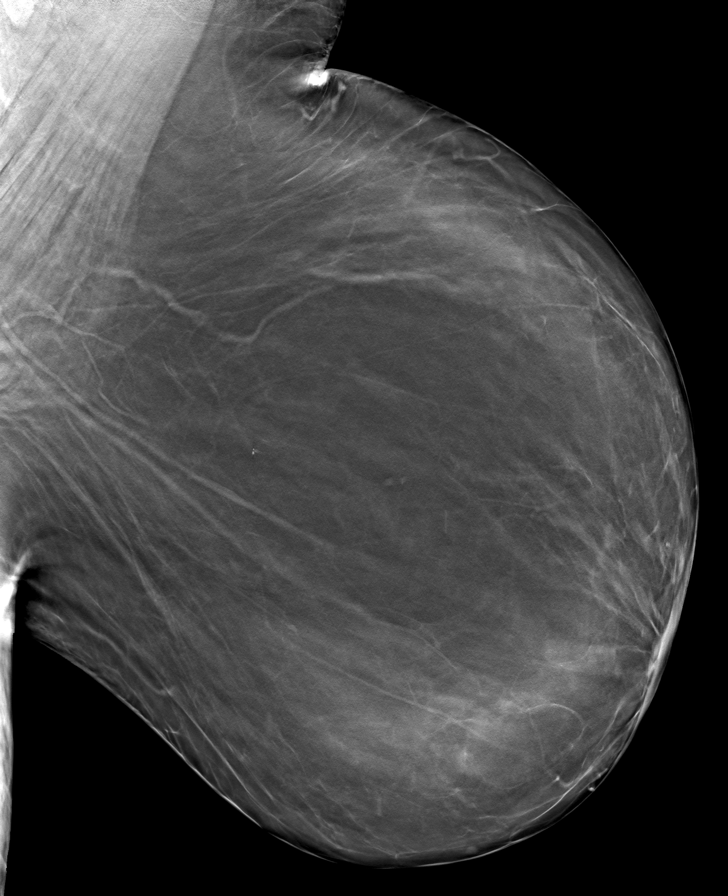

[L CC tomo · tomo slice 39/77.0]
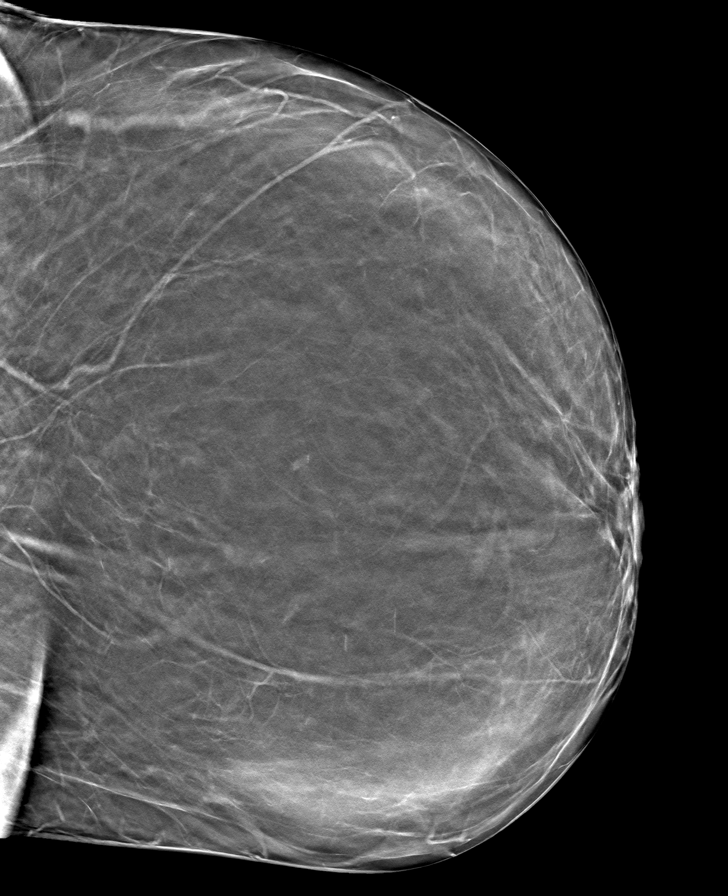

[8 of 24 positions shown; findings below may reference images not displayed]

FINDINGS: There are no findings suspicious for malignancy.
IMPRESSION: No mammographic evidence of malignancy. A result letter of this
screening mammogram will be mailed directly to the patient.

RECOMMENDATION:
Screening mammogram in one year. (Code:0E-3-N98)

BI-RADS CATEGORY  1: Negative.

## 2023-04-21 ENCOUNTER — Other Ambulatory Visit: Payer: Self-pay | Admitting: Internal Medicine

## 2023-04-21 DIAGNOSIS — M150 Primary generalized (osteo)arthritis: Secondary | ICD-10-CM

## 2023-04-21 MED ORDER — HYDROXYZINE HCL 25 MG PO TABS: 25.0000 mg | ORAL_TABLET | Freq: Three times a day (TID) | ORAL | 1 refills | Status: DC | PRN

## 2023-04-21 MED ORDER — HYDROCODONE-ACETAMINOPHEN 5-325 MG PO TABS: 1.0000 | ORAL_TABLET | Freq: Three times a day (TID) | ORAL | 0 refills | Status: DC | PRN

## 2023-04-21 NOTE — Telephone Encounter (Signed)
 Copied from CRM [id number]. Topic: Clinical - Medication Refill >> Apr 21, 2023  8:34 AM Leotis ORN wrote: Most Recent Primary Care Visit:  Provider: GARALD KARLYNN GAILS  Department: [facility]  Visit Type: OFFICE VISIT  Date: 01/30/2023  Medication: HYDROcodone -acetaminophen  (NORCO/VICODIN) 5-325 MG tablet  Has the patient contacted their pharmacy? No (Agent: If no, request that the patient contact the pharmacy for the refill. If patient does not wish to contact the pharmacy document the reason why and proceed with request.) (Agent: If yes, when and what did the pharmacy advise?)  Is this the correct pharmacy for this prescription? Yes If no, delete pharmacy and type the correct one.  This is the patient's preferred pharmacy:    [id number] - [place], [place] - [address] [place] [address] Phone: [phone] Fax: [phone]    Has the prescription been filled recently? Yes  Is the patient out of the medication? Yes  Has the patient been seen for an appointment in the last year OR does the patient have an upcoming appointment? Yes  Can we respond through MyChart? Yes  Agent: Please be advised that Rx refills may take up to 3 business days. We ask that you follow-up with your pharmacy.

## 2023-04-22 ENCOUNTER — Ambulatory Visit: Payer: 59 | Admitting: Internal Medicine

## 2023-04-22 ENCOUNTER — Encounter: Payer: Self-pay | Admitting: Internal Medicine

## 2023-04-22 VITALS — BP 118/78 | HR 81 | Temp 98.4°F | Ht 65.0 in | Wt 150.0 lb

## 2023-04-22 DIAGNOSIS — E559 Vitamin D deficiency, unspecified: Secondary | ICD-10-CM

## 2023-04-22 DIAGNOSIS — L959 Vasculitis limited to the skin, unspecified: Secondary | ICD-10-CM | POA: Diagnosis not present

## 2023-04-22 DIAGNOSIS — M545 Low back pain, unspecified: Secondary | ICD-10-CM

## 2023-04-22 DIAGNOSIS — M25512 Pain in left shoulder: Secondary | ICD-10-CM | POA: Diagnosis not present

## 2023-04-22 DIAGNOSIS — F4321 Adjustment disorder with depressed mood: Secondary | ICD-10-CM

## 2023-04-22 DIAGNOSIS — M79606 Pain in leg, unspecified: Secondary | ICD-10-CM

## 2023-04-22 DIAGNOSIS — N183 Chronic kidney disease, stage 3 unspecified: Secondary | ICD-10-CM

## 2023-04-22 DIAGNOSIS — I10 Essential (primary) hypertension: Secondary | ICD-10-CM

## 2023-04-22 DIAGNOSIS — S81801D Unspecified open wound, right lower leg, subsequent encounter: Secondary | ICD-10-CM

## 2023-04-22 MED ORDER — METHYLPREDNISOLONE ACETATE 40 MG/ML IJ SUSP
40.0000 mg | Freq: Once | INTRAMUSCULAR | Status: AC
Start: 2023-04-22 — End: 2023-04-22
  Administered 2023-04-22: 40 mg via INTRAMUSCULAR

## 2023-04-22 NOTE — Assessment & Plan Note (Signed)
 In remission.

## 2023-04-22 NOTE — Assessment & Plan Note (Signed)
Cont w/Norco prn  Potential benefits of a long term opioids use as well as potential risks (i.e. addiction risk, apnea etc) and complications (i.e. Somnolence, constipation and others) were explained to the patient and were aknowledged. 

## 2023-04-22 NOTE — Assessment & Plan Note (Signed)
 On Furosemide, KCl

## 2023-04-22 NOTE — Assessment & Plan Note (Signed)
 Father died (20-Feb-2023)

## 2023-04-22 NOTE — Assessment & Plan Note (Signed)
 Bursitis - worse See procedure

## 2023-04-22 NOTE — Assessment & Plan Note (Signed)
Cont w/Vit D 

## 2023-04-22 NOTE — Progress Notes (Signed)
 Subjective:  Patient ID: Jessica Lynn, female    DOB: March 28, 1961  Age: 63 y.o. MRN: [id number]  CC: Medical Management of Chronic Issues (3 mnth f/u)   HPI Jessica Lynn presents for grief, LBP, vasculitis  Outpatient Medications Prior to Visit  Medication Sig Dispense Refill   aspirin  EC 81 MG tablet Take 1 tablet (81 mg total) by mouth daily.     Cholecalciferol (VITAMIN D3) 2000 units capsule Take 1 capsule (2,000 Units total) by mouth daily. 100 capsule 3   clonazePAM  (KLONOPIN ) 1 MG tablet Take 1 tablet by mouth daily.     dorzolamide -timolol  (COSOPT ) 22.3-6.8 MG/ML ophthalmic solution Place 1 drop into the right eye 2 (two) times daily.     ferrous sulfate  (FEROSUL) 325 (65 FE) MG tablet TAKE 1 TABLET(325 MG) BY MOUTH DAILY 30 tablet 5   folic acid  (FOLVITE ) 1 MG tablet TAKE 1 TABLET(1 MG) BY MOUTH DAILY 100 tablet 3   furosemide  (LASIX ) 40 MG tablet TAKE 1 TABLET BY MOUTH EVERY DAY 90 tablet 1   HYDROcodone -acetaminophen  (NORCO/VICODIN) 5-325 MG tablet Take 1 tablet by mouth 3 (three) times daily as needed for severe pain (pain score 7-10). 90 tablet 0   hydrOXYzine  (ATARAX ) 25 MG tablet Take 1 tablet (25 mg total) by mouth every 8 (eight) hours as needed for itching. 270 tablet 1   KLOR-CON  M20 20 MEQ tablet TAKE 1 TABLET BY MOUTH EVERY DAY 90 tablet 1   montelukast  (SINGULAIR ) 10 MG tablet TAKE 1 TABLET(10 MG) BY MOUTH DAILY 90 tablet 3   mycophenolate (CELLCEPT) 250 MG capsule Take 250 mg by mouth 2 (two) times daily.     Omega-3 Fatty Acids (FISH OIL ) 1000 MG CAPS Take 2 capsules (2,000 mg total) by mouth daily.     pantoprazole  (PROTONIX ) 40 MG tablet Take 1 tablet by mouth daily.     pravastatin  (PRAVACHOL ) 20 MG tablet Take 1 tablet by mouth daily.     prednisoLONE  acetate (PRED FORTE ) 1 % ophthalmic suspension 1 drop 2 (two) times daily.     valsartan  (DIOVAN ) 80 MG tablet Take 1 tablet by mouth daily.     Zinc  Sulfate 220 (50 Zn) MG TABS Take 1 tablet (220 mg total)  by mouth daily. 30 tablet 0   zolpidem  (AMBIEN ) 10 MG tablet TAKE 1 TABLET(10 MG) BY MOUTH AT BEDTIME AS NEEDED FOR SLEEP 30 tablet 3   No facility-administered medications prior to visit.    ROS: Review of Systems  Constitutional:  Negative for activity change, appetite change, chills, fatigue and unexpected weight change.  HENT:  Negative for congestion, mouth sores and sinus pressure.   Eyes:  Negative for visual disturbance.  Respiratory:  Negative for cough and chest tightness.   Gastrointestinal:  Negative for abdominal pain and nausea.  Genitourinary:  Negative for difficulty urinating, frequency and vaginal pain.  Musculoskeletal:  Positive for arthralgias, back pain and gait problem.  Skin:  Negative for pallor and rash.  Neurological:  Negative for dizziness, tremors, weakness, numbness and headaches.  Psychiatric/Behavioral:  Negative for confusion, sleep disturbance and suicidal ideas. The patient is nervous/anxious.     Objective:  BP 118/78 (BP Location: Left Arm, Patient Position: Sitting, Cuff Size: Normal)   Pulse 81   Temp 98.4 F (36.9 C) (Oral)   Ht 5\' 5"  (1.651 m)   Wt 150 lb (68 kg)   LMP 06/06/2015 Comment: irregular   SpO2 99%   BMI 24.96 kg/m   BP  Readings from Last 3 Encounters:  04/22/23 118/78  01/30/23 120/72  12/02/22 110/70    Wt Readings from Last 3 Encounters:  04/22/23 150 lb (68 kg)  01/30/23 145 lb 8 oz (66 kg)  12/02/22 152 lb (68.9 kg)    Physical Exam Constitutional:      General: Jessica Lynn is not in acute distress.    Appearance: Jessica Lynn is well-developed.  HENT:     Head: Normocephalic.     Right Ear: External ear normal.     Left Ear: External ear normal.     Nose: Nose normal.  Eyes:     General:        Right eye: No discharge.        Left eye: No discharge.     Conjunctiva/sclera: Conjunctivae normal.     Pupils: Pupils are equal, round, and reactive to light.  Neck:     Thyroid : No thyromegaly.     Vascular: No JVD.      Trachea: No tracheal deviation.  Cardiovascular:     Rate and Rhythm: Normal rate and regular rhythm.     Heart sounds: Normal heart sounds.  Pulmonary:     Effort: No respiratory distress.     Breath sounds: No stridor. No wheezing.  Abdominal:     General: Bowel sounds are normal. There is no distension.     Palpations: Abdomen is soft. There is no mass.     Tenderness: There is no abdominal tenderness. There is no guarding or rebound.  Musculoskeletal:        General: Tenderness present.     Cervical back: Normal range of motion and neck supple. No rigidity.  Lymphadenopathy:     Cervical: No cervical adenopathy.  Skin:    Findings: No erythema or rash.  Neurological:     Mental Status: Jessica Lynn is oriented to person, place, and time.     Cranial Nerves: No cranial nerve deficit.     Motor: No abnormal muscle tone.     Coordination: Coordination normal.     Gait: Gait abnormal.     Deep Tendon Reflexes: Reflexes normal.  Psychiatric:        Behavior: Behavior normal.        Thought Content: Thought content normal.        Judgment: Judgment normal.   Antalgic gait L shoulder w/pain    Procedure :Joint Injection,  L shoulder   Indication:  Subacromial bursitis with refractory  chronic pain.   Risks including unsuccessful procedure , bleeding, infection, bruising, skin atrophy, "steroid flare-up" and others were explained to the patient in detail as well as the benefits. Informed consent was obtained and signed.   Tthe patient was placed in a comfortable position. Lateral approach was used. Skin was prepped with Betadine  and alcohol  and anesthetized with a cooling spray. Then, a 5 cc syringe with a 2 inch long 24-gauge needle was used for a joint injection.. The needle was advanced  Into the subacromial space.The bursa was injected with 3 mL of 2% lidocaine  and 40 mg of Depo-Medrol  .  Band-Aid was applied.   Tolerated well. Complications: None. Partial pain relief following the  procedure.   Postprocedure instructions :    A Band-Aid should be left on for 12 hours. Injection therapy is not a cure itself. It is used in conjunction with other modalities. You can use nonsteroidal anti-inflammatories like ibuprofen  , hot and cold compresses. Rest is recommended in the next 24 hours.  You need to report immediately  if fever, chills or any signs of infection develop.  Lab Results  Component Value Date   WBC 6.7 04/08/2022   HGB 11.1 (L) 04/08/2022   HCT 32.9 (L) 04/08/2022   PLT 275.0 04/08/2022   GLUCOSE 92 04/08/2022   CHOL 148 12/16/2019   TRIG 71 12/16/2019   HDL 70 12/16/2019   LDLDIRECT 160.7 02/06/2012   LDLCALC 63 12/16/2019   ALT 14 04/08/2022   AST 24 04/08/2022   NA 138 04/08/2022   K 4.2 04/08/2022   CL 102 04/08/2022   CREATININE 1.29 (H) 04/08/2022   BUN 21 04/08/2022   CO2 27 04/08/2022   TSH 1.65 09/16/2021   INR 1.4 (H) 12/08/2021   HGBA1C 5.5 09/16/2021    MM Digital Diagnostic Unilat L Result Date: 12/31/2022 CLINICAL DATA:  Six-month interval follow-up of likely benign calcifications involving the outer LEFT breast at middle depth. EXAM: DIGITAL DIAGNOSTIC UNILATERAL LEFT MAMMOGRAM WITH CAD TECHNIQUE: Left digital diagnostic mammography was performed. COMPARISON:  Previous exam(s). ACR Breast Density Category b: There are scattered areas of fibroglandular density. FINDINGS: Full field CC and MLO views and spot magnification CC and mediolateral views of the calcifications obtained. The 3 mm group of calcifications in the outer breast at middle depth are unchanged. There are no new suspicious linear or branching forms. No new or suspicious findings elsewhere. IMPRESSION: Stable likely benign 3 mm group of calcifications in the outer LEFT breast at middle depth. RECOMMENDATION: Annual BILATERAL diagnostic mammography in 6 months (to include spot magnification views of the LEFT breast calcifications). I have discussed the findings and  recommendations with the patient. If applicable, a reminder letter will be sent to the patient regarding the next appointment. BI-RADS CATEGORY  3: Probably benign. Electronically Signed   By: Rinda Cheers M.D.   On: 12/31/2022 16:23    Assessment & Plan:   Problem List Items Addressed This Visit     Essential hypertension   On Furosemide , KCl      Relevant Medications   valsartan  (DIOVAN ) 80 MG tablet   pravastatin  (PRAVACHOL ) 20 MG tablet   Vitamin D  deficiency   Cont w/Vit D      Low back pain radiating down leg - Primary   Cont w/Norco prn  Potential benefits of a long term opioids use as well as potential risks (i.e. addiction risk, apnea etc) and complications (i.e. Somnolence, constipation and others) were explained to the patient and were aknowledged.      Vasculitis of skin   In remission      Relevant Medications   valsartan  (DIOVAN ) 80 MG tablet   pravastatin  (PRAVACHOL ) 20 MG tablet   Multiple open wounds of right lower extremity   All healed      Shoulder pain, left   Bursitis - worse See procedure      CRI (chronic renal insufficiency), stage 3 (moderate) (HCC)   GFR 48 No NSAIDs, no Naproxen       Grief   Father died (Jan 29, 2023)         Meds ordered this encounter  Medications   methylPREDNISolone  acetate (DEPO-MEDROL ) injection 40 mg      Follow-up: No follow-ups on file.  Anitra Barn, MD

## 2023-04-22 NOTE — Assessment & Plan Note (Signed)
 GFR 48 No NSAIDs, no Naproxen 

## 2023-04-22 NOTE — Patient Instructions (Addendum)
 USEFUL THINGS FOR ARTHRITIS and musculoskeletal pains:    A "rice sock heating pad" refers to a homemade heating pad created by filling a sock with uncooked rice, which can be heated in a microwave to provide a warm compress for sore muscles, pain relief, or other applications; essentially, it's a simple way to generate heat using readily available materials.  Key points about rice sock heat: How to make it: Fill a clean sock (preferably a tube sock) about 2/3 full with uncooked rice, tie a knot at the top to secure the rice inside.  Heating it up: Place the rice sock in the microwave and heat in short intervals (usually around 30 seconds at a time) until it reaches the desired warmth.  Important considerations: Check temperature before applying: Always test the temperature of the rice sock before applying it to your skin to avoid burns.  Use a towel to protect skin: Wrap the rice sock in a thin towel to distribute the heat evenly and protect your skin.  Uses: Muscle aches and pains  Menstrual cramps  Neck pain  Arthritis discomfort   SILICONE PADS: Use them to open jars and bottles    BRIX JAR OPENER: Use them to open jars    NITRILE COATED GARDEN GLOVES: Use down to open jars, lift boxes, etc.   THUMB BRACE: Use it for thumb arthritis flareup pain     BLUE EMU CREAM: Use it 2-3 times a day on painful areas     Postprocedure instructions :    A Band-Aid should be left on for 12 hours. Injection therapy is not a cure itself. It is used in conjunction with other modalities. You can use nonsteroidal anti-inflammatories like ibuprofen  , hot and cold compresses. Rest is recommended in the next 24 hours. You need to report immediately  if fever, chills or any signs of infection develop.

## 2023-04-22 NOTE — Assessment & Plan Note (Signed)
All healed?

## 2023-04-25 ENCOUNTER — Other Ambulatory Visit: Payer: Self-pay | Admitting: Internal Medicine

## 2023-04-28 ENCOUNTER — Other Ambulatory Visit: Payer: Self-pay | Admitting: Internal Medicine

## 2023-05-21 ENCOUNTER — Other Ambulatory Visit: Payer: Self-pay | Admitting: Internal Medicine

## 2023-05-21 DIAGNOSIS — M150 Primary generalized (osteo)arthritis: Secondary | ICD-10-CM

## 2023-05-21 NOTE — Telephone Encounter (Signed)
Copied from CRM [id number]. Topic: Clinical - Medication Refill >> May 21, 2023  7:38 AM Kathryne Eriksson wrote: Most Recent Primary Care Visit:  Provider: Tresa Garter  Department: [facility]  Visit Type: OFFICE VISIT  Date: 04/22/2023  Medication: HYDROcodone-acetaminophen (NORCO/VICODIN) 5-325 MG tablet  Has the patient contacted their pharmacy? Yes (Agent: If no, request that the patient contact the pharmacy for the refill. If patient does not wish to contact the pharmacy document the reason why and proceed with request.) (Agent: If yes, when and what did the pharmacy advise?)  Is this the correct pharmacy for this prescription? Yes If no, delete pharmacy and type the correct one.  This is the patient's preferred pharmacy:  [id number] - [place], [place] - [address] [place] [address] Phone: [phone] Fax: [phone]    Has the prescription been filled recently? No  Is the patient out of the medication? Yes  Has the patient been seen for an appointment in the last year OR does the patient have an upcoming appointment? Yes  Can we respond through MyChart? Yes  Agent: Please be advised that Rx refills may take up to 3 business days. We ask that you follow-up with your pharmacy.

## 2023-05-22 MED ORDER — HYDROCODONE-ACETAMINOPHEN 5-325 MG PO TABS
1.0000 | ORAL_TABLET | Freq: Three times a day (TID) | ORAL | 0 refills | Status: DC | PRN
Start: 2023-05-22 — End: 2023-06-18

## 2023-05-28 ENCOUNTER — Other Ambulatory Visit: Payer: Self-pay | Admitting: Internal Medicine

## 2023-06-18 ENCOUNTER — Other Ambulatory Visit: Payer: Self-pay | Admitting: Internal Medicine

## 2023-06-18 DIAGNOSIS — M150 Primary generalized (osteo)arthritis: Secondary | ICD-10-CM

## 2023-06-19 MED ORDER — HYDROCODONE-ACETAMINOPHEN 5-325 MG PO TABS
1.0000 | ORAL_TABLET | Freq: Three times a day (TID) | ORAL | 0 refills | Status: DC | PRN
Start: 2023-06-19 — End: 2023-07-17

## 2023-06-22 ENCOUNTER — Other Ambulatory Visit: Payer: Self-pay | Admitting: Internal Medicine

## 2023-07-01 ENCOUNTER — Ambulatory Visit
Admission: RE | Admit: 2023-07-01 | Discharge: 2023-07-01 | Disposition: A | Discharge status: Home / Self Care | Payer: BC Managed Care – PPO | Source: Ambulatory Visit | Attending: Internal Medicine | Admitting: Internal Medicine

## 2023-07-01 DIAGNOSIS — R921 Mammographic calcification found on diagnostic imaging of breast: Secondary | ICD-10-CM

## 2023-07-17 ENCOUNTER — Other Ambulatory Visit: Payer: Self-pay | Admitting: Internal Medicine

## 2023-07-17 DIAGNOSIS — M150 Primary generalized (osteo)arthritis: Secondary | ICD-10-CM

## 2023-07-17 MED ORDER — HYDROCODONE-ACETAMINOPHEN 5-325 MG PO TABS: 1.0000 | ORAL_TABLET | Freq: Three times a day (TID) | ORAL | 0 refills | Status: DC | PRN

## 2023-07-17 NOTE — Telephone Encounter (Signed)
 Copied from CRM [id number]. Topic: Clinical - Medication Refill >> Jul 17, 2023 11:47 AM Saverio Danker wrote: Most Recent Primary Care Visit:  Provider: Tresa Garter  Department: [facility]  Visit Type: OFFICE VISIT  Date: 04/22/2023  Medication: HYDROcodone-acetaminophen (NORCO/VICODIN) 5-325 MG tablet  Has the patient contacted their pharmacy? Yes (Agent: If no, request that the patient contact the pharmacy for the refill. If patient does not wish to contact the pharmacy document the reason why and proceed with request.) (Agent: If yes, when and what did the pharmacy advise?)  Is this the correct pharmacy for this prescription? Yes If no, delete pharmacy and type the correct one.  This is the patient's preferred pharmacy:  [id number] - [place], [place] - [address] [place] [address] Phone: [phone] Fax: [phone]    Has the prescription been filled recently? No  Is the patient out of the medication? Yes  Has the patient been seen for an appointment in the last year OR does the patient have an upcoming appointment? Yes  Can we respond through MyChart? No  Agent: Please be advised that Rx refills may take up to 3 business days. We ask that you follow-up with your pharmacy.

## 2023-07-18 ENCOUNTER — Other Ambulatory Visit: Payer: Self-pay | Admitting: Internal Medicine

## 2023-07-22 ENCOUNTER — Ambulatory Visit: Payer: 59 | Admitting: Internal Medicine

## 2023-08-04 ENCOUNTER — Encounter: Payer: Self-pay | Admitting: Internal Medicine

## 2023-08-04 ENCOUNTER — Ambulatory Visit: Admitting: Internal Medicine

## 2023-08-04 VITALS — BP 112/68 | HR 63 | Temp 98.2°F | Ht 65.0 in | Wt 163.8 lb

## 2023-08-04 DIAGNOSIS — I10 Essential (primary) hypertension: Secondary | ICD-10-CM

## 2023-08-04 DIAGNOSIS — M545 Low back pain, unspecified: Secondary | ICD-10-CM | POA: Diagnosis not present

## 2023-08-04 DIAGNOSIS — E559 Vitamin D deficiency, unspecified: Secondary | ICD-10-CM | POA: Diagnosis not present

## 2023-08-04 DIAGNOSIS — M150 Primary generalized (osteo)arthritis: Secondary | ICD-10-CM

## 2023-08-04 DIAGNOSIS — F419 Anxiety disorder, unspecified: Secondary | ICD-10-CM

## 2023-08-04 DIAGNOSIS — M79606 Pain in leg, unspecified: Secondary | ICD-10-CM

## 2023-08-04 MED ORDER — HYDROCODONE-ACETAMINOPHEN 5-325 MG PO TABS: 1.0000 | ORAL_TABLET | Freq: Three times a day (TID) | ORAL | 0 refills | Status: DC | PRN

## 2023-08-04 NOTE — Assessment & Plan Note (Signed)
 On Furosemide, KCl

## 2023-08-04 NOTE — Progress Notes (Signed)
 Subjective:  Patient ID: Jessica Lynn, female    DOB: [birth year]  Age: 63 y.o. MRN: [id number]  CC: Medical Management of Chronic Issues (3 Month Follow Up. Patient notes still experiencing ongoing lower back pain, radiating down left leg. Sometimes pain worsens to the point where patient feels she might lose support in that leg)   HPI Jessica Lynn presents for LBP, insomnia, vasculitis  Outpatient Medications Prior to Visit  Medication Sig Dispense Refill   Cholecalciferol (VITAMIN D3) 2000 units capsule Take 1 capsule (2,000 Units total) by mouth daily. 100 capsule 3   dorzolamide -timolol  (COSOPT ) 22.3-6.8 MG/ML ophthalmic solution Place 1 drop into the right eye 2 (two) times daily.     FEROSUL 325 (65 Fe) MG tablet TAKE 1 TABLET(325 MG) BY MOUTH DAILY 30 tablet 5   folic acid  (FOLVITE ) 1 MG tablet TAKE 1 TABLET(1 MG) BY MOUTH DAILY 100 tablet 3   furosemide  (LASIX ) 40 MG tablet TAKE 1 TABLET BY MOUTH EVERY DAY 90 tablet 1   hydrOXYzine  (ATARAX ) 25 MG tablet Take 1 tablet (25 mg total) by mouth every 8 (eight) hours as needed for itching. 270 tablet 1   KLOR-CON  M20 20 MEQ tablet TAKE 1 TABLET BY MOUTH EVERY DAY 90 tablet 1   mycophenolate (CELLCEPT) 250 MG capsule Take 250 mg by mouth 2 (two) times daily.     prednisoLONE  acetate (PRED FORTE ) 1 % ophthalmic suspension 1 drop 2 (two) times daily.     zolpidem  (AMBIEN ) 10 MG tablet TAKE 1 TABLET(10 MG) BY MOUTH AT BEDTIME AS NEEDED FOR SLEEP 30 tablet 5   HYDROcodone -acetaminophen  (NORCO/VICODIN) 5-325 MG tablet Take 1 tablet by mouth 3 (three) times daily as needed for severe pain (pain score 7-10). 90 tablet 0   aspirin  EC 81 MG tablet Take 1 tablet (81 mg total) by mouth daily. (Patient not taking: Reported on 08/04/2023)     clonazePAM  (KLONOPIN ) 1 MG tablet Take 1 tablet by mouth daily. (Patient not taking: Reported on 08/04/2023)     montelukast  (SINGULAIR ) 10 MG tablet TAKE 1 TABLET(10 MG) BY MOUTH DAILY (Patient not taking:  Reported on 08/04/2023) 90 tablet 3   Omega-3 Fatty Acids (FISH OIL ) 1000 MG CAPS Take 2 capsules (2,000 mg total) by mouth daily. (Patient not taking: Reported on 08/04/2023)     pantoprazole  (PROTONIX ) 40 MG tablet Take 1 tablet by mouth daily. (Patient not taking: Reported on 08/04/2023)     pravastatin  (PRAVACHOL ) 20 MG tablet Take 1 tablet by mouth daily. (Patient not taking: Reported on 08/04/2023)     valsartan  (DIOVAN ) 80 MG tablet Take 1 tablet by mouth daily. (Patient not taking: Reported on 08/04/2023)     Zinc  Sulfate 220 (50 Zn) MG TABS Take 1 tablet (220 mg total) by mouth daily. (Patient not taking: Reported on 08/04/2023) 30 tablet 0   No facility-administered medications prior to visit.    ROS: Review of Systems  Constitutional:  Negative for activity change, appetite change, chills, fatigue and unexpected weight change.  HENT:  Negative for congestion, mouth sores and sinus pressure.   Eyes:  Negative for visual disturbance.  Respiratory:  Negative for cough and chest tightness.   Gastrointestinal:  Negative for abdominal pain and nausea.  Genitourinary:  Negative for difficulty urinating, frequency and vaginal pain.  Musculoskeletal:  Positive for arthralgias and back pain. Negative for gait problem.  Skin:  Negative for pallor and rash.  Neurological:  Negative for dizziness, tremors, weakness, numbness and headaches.  Psychiatric/Behavioral:  Positive for sleep disturbance. Negative for confusion, self-injury and suicidal ideas. The patient is nervous/anxious.     Objective:  BP 112/68   Pulse 63   Temp 98.2 F (36.8 C)   Ht 5\' 5"  (1.651 m)   Wt 163 lb 12.8 oz (74.3 kg)   LMP 06/06/2015 Comment: irregular   SpO2 99%   BMI 27.26 kg/m   BP Readings from Last 3 Encounters:  08/04/23 112/68  04/22/23 118/78  01/30/23 120/72    Wt Readings from Last 3 Encounters:  08/04/23 163 lb 12.8 oz (74.3 kg)  04/22/23 150 lb (68 kg)  01/30/23 145 lb 8 oz (66 kg)     Physical Exam Constitutional:      General: She is not in acute distress.    Appearance: Normal appearance. She is well-developed.  HENT:     Head: Normocephalic.     Right Ear: External ear normal.     Left Ear: External ear normal.     Nose: Nose normal.  Eyes:     General:        Right eye: No discharge.        Left eye: No discharge.     Conjunctiva/sclera: Conjunctivae normal.     Pupils: Pupils are equal, round, and reactive to light.  Neck:     Thyroid : No thyromegaly.     Vascular: No JVD.     Trachea: No tracheal deviation.  Cardiovascular:     Rate and Rhythm: Normal rate and regular rhythm.     Heart sounds: Normal heart sounds.  Pulmonary:     Effort: No respiratory distress.     Breath sounds: No stridor. No wheezing.  Abdominal:     General: Bowel sounds are normal. There is no distension.     Palpations: Abdomen is soft. There is no mass.     Tenderness: There is no abdominal tenderness. There is no guarding or rebound.  Musculoskeletal:        General: No tenderness.     Cervical back: Normal range of motion and neck supple. No rigidity.  Lymphadenopathy:     Cervical: No cervical adenopathy.  Skin:    Findings: No erythema or rash.  Neurological:     Mental Status: She is oriented to person, place, and time.     Cranial Nerves: No cranial nerve deficit.     Motor: No abnormal muscle tone.     Coordination: Coordination normal.     Deep Tendon Reflexes: Reflexes normal.  Psychiatric:        Behavior: Behavior normal.        Thought Content: Thought content normal.        Judgment: Judgment normal.     Lab Results  Component Value Date   WBC 6.7 04/08/2022   HGB 11.1 (L) 04/08/2022   HCT 32.9 (L) 04/08/2022   PLT 275.0 04/08/2022   GLUCOSE 92 04/08/2022   CHOL 148 12/16/2019   TRIG 71 12/16/2019   HDL 70 12/16/2019   LDLDIRECT 160.7 02/06/2012   LDLCALC 63 12/16/2019   ALT 14 04/08/2022   AST 24 04/08/2022   NA 138 04/08/2022   K  4.2 04/08/2022   CL 102 04/08/2022   CREATININE 1.29 (H) 04/08/2022   BUN 21 04/08/2022   CO2 27 04/08/2022   TSH 1.65 09/16/2021   INR 1.4 (H) 12/08/2021   HGBA1C 5.5 09/16/2021    MM 3D DIAGNOSTIC MAMMOGRAM BILATERAL BREAST Result Date: 07/01/2023 CLINICAL  DATA:  One year follow-up for probably benign left breast calcifications, initially assessed with diagnostic imaging on 06/26/2022. EXAM: DIGITAL DIAGNOSTIC BILATERAL MAMMOGRAM WITH TOMOSYNTHESIS AND CAD TECHNIQUE: Bilateral digital diagnostic mammography and breast tomosynthesis was performed. The images were evaluated with computer-aided detection. COMPARISON:  Previous exam(s). ACR Breast Density Category b: There are scattered areas of fibroglandular density. FINDINGS: Faint small group of calcifications in the upper outer left breast are stable. There are no masses, areas of significant asymmetry, areas of architectural distortion or new calcifications. IMPRESSION: 1. Probably benign left breast calcifications, stable for 1 year. RECOMMENDATION: Diagnostic bilateral mammography with left breast magnification views in 1 year to provide 2 years of stability for the probably benign left breast calcifications. I have discussed the findings and recommendations with the patient. If applicable, a reminder letter will be sent to the patient regarding the next appointment. BI-RADS CATEGORY  3: Probably benign. Electronically Signed   By: Amanda Jungling M.D.   On: 07/01/2023 15:43    Assessment & Plan:   Problem List Items Addressed This Visit     Essential hypertension   On Furosemide , KCl      Vitamin D  deficiency   Cont w/Vit D      Low back pain radiating down leg - Primary    Cont w/Norco prn  Potential benefits of a long term opioids use as well as potential risks (i.e. addiction risk, apnea etc) and complications (i.e. Somnolence, constipation and others) were explained to the patient and were aknowledged. LS MRI IMPRESSION: 1.  Technically limited study due to the patient's inability to tolerate the full length of the exam. Sagittal sequences only were obtained. 2. Diffuse edema throughout the subcutaneous fat of the lower back. Finding is nonspecific, and could be related to overall volume status. Regional cellulitis could also have this appearance. Correlation with physical exam recommended. No loculated collections. 3. No other evidence for acute infection within the lumbar spine. 4. Underlying moderate multilevel degenerative spondylosis and facet arthrosis as above. No significant spinal stenosis evident on this limited exam. Mild-to-moderate bilateral foraminal narrowing at L2-3 through L4-5.     Electronically Signed   By: Virgia Griffins M.D.   On: 12/08/2021 01:18        Relevant Medications   HYDROcodone -acetaminophen  (NORCO/VICODIN) 5-325 MG tablet   Anxiety disorder    Cont w/Clonazepam  prn  Potential benefits of a long term benzodiazepines  use as well as potential risks  and complications were explained to the patient and were aknowledged. Not to take w/Norco      Primary osteoarthritis involving multiple joints   Relevant Medications   HYDROcodone -acetaminophen  (NORCO/VICODIN) 5-325 MG tablet      Meds ordered this encounter  Medications   HYDROcodone -acetaminophen  (NORCO/VICODIN) 5-325 MG tablet    Sig: Take 1 tablet by mouth 3 (three) times daily as needed for severe pain (pain score 7-10).    Dispense:  90 tablet    Refill:  0    Please fill on or after 08/19/23.  Office visit every 3 months      Follow-up: Return in about 3 months (around 11/03/2023) for a follow-up visit.  Anitra Barn, MD

## 2023-08-04 NOTE — Assessment & Plan Note (Signed)
Cont w/Vit D 

## 2023-08-04 NOTE — Assessment & Plan Note (Signed)
  Cont w/Clonazepam prn  Potential benefits of a long term benzodiazepines  use as well as potential risks  and complications were explained to the patient and were aknowledged. Not to take w/Norco

## 2023-08-04 NOTE — Assessment & Plan Note (Signed)
  Cont w/Norco prn  Potential benefits of a long term opioids use as well as potential risks (i.e. addiction risk, apnea etc) and complications (i.e. Somnolence, constipation and others) were explained to the patient and were aknowledged. LS MRI IMPRESSION: 1. Technically limited study due to the patient's inability to tolerate the full length of the exam. Sagittal sequences only were obtained. 2. Diffuse edema throughout the subcutaneous fat of the lower back. Finding is nonspecific, and could be related to overall volume status. Regional cellulitis could also have this appearance. Correlation with physical exam recommended. No loculated collections. 3. No other evidence for acute infection within the lumbar spine. 4. Underlying moderate multilevel degenerative spondylosis and facet arthrosis as above. No significant spinal stenosis evident on this limited exam. Mild-to-moderate bilateral foraminal narrowing at L2-3 through L4-5.     Electronically Signed   By: Virgia Griffins M.D.   On: 12/08/2021 01:18

## 2023-09-18 ENCOUNTER — Telehealth: Payer: Self-pay | Admitting: Internal Medicine

## 2023-09-23 ENCOUNTER — Telehealth: Payer: Self-pay | Admitting: Internal Medicine

## 2023-09-23 DIAGNOSIS — M150 Primary generalized (osteo)arthritis: Secondary | ICD-10-CM

## 2023-09-23 NOTE — Telephone Encounter (Signed)
 Copied from CRM [id number]. Topic: Clinical - Medication Refill >> Sep 23, 2023  8:24 AM Cruzita Dopp I wrote: Medication: HYDROcodone -acetaminophen  (NORCO/VICODIN) 5-325 MG tablet  Has the patient contacted their pharmacy? No (Agent: If no, request that the patient contact the pharmacy for the refill. If patient does not wish to contact the pharmacy document the reason why and proceed with request.) (Agent: If yes, when and what did the pharmacy advise?)  This is the patient's preferred pharmacy:  [id number] - [place], [place] - [address] [place]  [address] Phone: [phone] Fax: [phone]   Is this the correct pharmacy for this prescription? Yes If no, delete pharmacy and type the correct one.   Has the prescription been filled recently? No  Is the patient out of the medication? Yes  Has the patient been seen for an appointment in the last year OR does the patient have an upcoming appointment? Yes  Can we respond through MyChart? Yes  Agent: Please be advised that Rx refills may take up to 3 business days. We ask that you follow-up with your pharmacy.

## 2023-09-24 NOTE — Telephone Encounter (Unsigned)
 Copied from CRM [id number]. Topic: Clinical - Medication Question >> Sep 24, 2023 10:57 AM Albertha Alosa wrote: Reason for CRM: Patient called in wanting to know the status her prescription  HYDROcodone -acetaminophen  (NORCO/VICODIN) 5-325 MG tablet , would like for a nurse to give her a callback regarding when it will be sent to the pharmacy

## 2023-09-25 MED ORDER — HYDROCODONE-ACETAMINOPHEN 5-325 MG PO TABS: 1.0000 | ORAL_TABLET | Freq: Three times a day (TID) | ORAL | 0 refills | Status: DC | PRN

## 2023-09-25 NOTE — Telephone Encounter (Signed)
 Done. Thanks.

## 2023-10-25 ENCOUNTER — Other Ambulatory Visit: Payer: Self-pay | Admitting: Internal Medicine

## 2023-10-26 ENCOUNTER — Telehealth: Payer: Self-pay | Admitting: Internal Medicine

## 2023-10-26 DIAGNOSIS — M150 Primary generalized (osteo)arthritis: Secondary | ICD-10-CM

## 2023-10-26 NOTE — Telephone Encounter (Signed)
 FYI Only or Action Required?: Action required by provider: medication refill request.  Patient was last seen in primary care on 08/04/2023 by Plotnikov, Karlynn GAILS, MD.  Called Nurse Triage reporting No chief complaint on file..  Symptoms began today.  Interventions attempted: Nothing.  Symptoms are: stable.  Triage Disposition: No disposition on file.  Patient/caregiver understands and will follow disposition?:

## 2023-10-26 NOTE — Telephone Encounter (Unsigned)
 Copied from CRM [id number]. Topic: Clinical - Medication Refill >> Oct 26, 2023 10:06 AM Tanazia G wrote: Medication: HYDROcodone -acetaminophen  (NORCO/VICODIN) 5-325 MG tablet  Has the patient contacted their pharmacy? Yes (Agent: If no, request that the patient contact the pharmacy for the refill. If patient does not wish to contact the pharmacy document the reason why and proceed with request.) (Agent: If yes, when and what did the pharmacy advise?)  This is the patient's preferred pharmacy:  [id number] - [place], [place] - [address] [place] [address] Phone: [phone] Fax: [phone]  Is this the correct pharmacy for this prescription? Yes If no, delete pharmacy and type the correct one.   Has the prescription been filled recently? Yes  Is the patient out of the medication? Yes  Has the patient been seen for an appointment in the last year OR does the patient have an upcoming appointment? Yes  Can we respond through MyChart? Yes  Agent: Please be advised that Rx refills may take up to 3 business days. We ask that you follow-up with your pharmacy.

## 2023-10-27 ENCOUNTER — Other Ambulatory Visit: Payer: Self-pay | Admitting: Internal Medicine

## 2023-10-27 MED ORDER — HYDROCODONE-ACETAMINOPHEN 5-325 MG PO TABS: 1.0000 | ORAL_TABLET | Freq: Three times a day (TID) | ORAL | 0 refills | Status: DC | PRN

## 2023-10-27 NOTE — Telephone Encounter (Signed)
 Done. Thx.

## 2023-11-05 ENCOUNTER — Ambulatory Visit (INDEPENDENT_AMBULATORY_CARE_PROVIDER_SITE_OTHER): Admitting: Internal Medicine

## 2023-11-05 ENCOUNTER — Encounter: Payer: Self-pay | Admitting: Internal Medicine

## 2023-11-05 VITALS — Ht 65.0 in | Wt 160.0 lb

## 2023-11-05 DIAGNOSIS — G4700 Insomnia, unspecified: Secondary | ICD-10-CM | POA: Diagnosis not present

## 2023-11-05 DIAGNOSIS — M545 Low back pain, unspecified: Secondary | ICD-10-CM | POA: Diagnosis not present

## 2023-11-05 DIAGNOSIS — N183 Chronic kidney disease, stage 3 unspecified: Secondary | ICD-10-CM

## 2023-11-05 DIAGNOSIS — M79606 Pain in leg, unspecified: Secondary | ICD-10-CM

## 2023-11-05 DIAGNOSIS — I10 Essential (primary) hypertension: Secondary | ICD-10-CM | POA: Diagnosis not present

## 2023-11-05 DIAGNOSIS — G629 Polyneuropathy, unspecified: Secondary | ICD-10-CM | POA: Insufficient documentation

## 2023-11-05 DIAGNOSIS — E559 Vitamin D deficiency, unspecified: Secondary | ICD-10-CM

## 2023-11-05 MED ORDER — B COMPLEX VITAMINS PO CAPS: 1.0000 | ORAL_CAPSULE | Freq: Every day | ORAL | Status: AC

## 2023-11-05 NOTE — Assessment & Plan Note (Signed)
 Cont w/Zolpidem prn  Potential benefits of a long term benzodiazepines  use as well as potential risks  and complications were explained to the patient and were aknowledged.

## 2023-11-05 NOTE — Assessment & Plan Note (Signed)
Cont w/Norco prn  Potential benefits of a long term opioids use as well as potential risks (i.e. addiction risk, apnea etc) and complications (i.e. Somnolence, constipation and others) were explained to the patient and were aknowledged. 

## 2023-11-05 NOTE — Assessment & Plan Note (Signed)
Cont w/Vit D 

## 2023-11-05 NOTE — Progress Notes (Signed)
 Subjective:  Patient ID: Jessica Lynn, female    DOB: [birth year]  Age: 63 y.o. MRN: [id number]  CC: Medical Management of Chronic Issues (3 mnth f/u, Pt is having issues with her feet with pain no matter the type of shoe she has on and its causing pain, pt is also having issues in her leg she had her surgery is causing pain and left shin where she has had sores she is having sharp nerve like pain. Both legs are causing pt to have numbness )   HPI Jessica Lynn presents for 3 mnth f/u - LBP, CRI, HTN.  Pt is having issues with her feet with pain no matter the type of shoe she has on and its causing pain, pt is also having issues in her leg she had her surgery is causing pain and left shin where she has had sores she is having sharp nerve like pain. Both legs are causing pt to have numbness   Outpatient Medications Prior to Visit  Medication Sig Dispense Refill   Cholecalciferol (VITAMIN D3) 2000 units capsule Take 1 capsule (2,000 Units total) by mouth daily. 100 capsule 3   dorzolamide -timolol  (COSOPT ) 22.3-6.8 MG/ML ophthalmic solution Place 1 drop into the right eye 2 (two) times daily.     FEROSUL 325 (65 Fe) MG tablet TAKE 1 TABLET(325 MG) BY MOUTH DAILY 30 tablet 5   folic acid  (FOLVITE ) 1 MG tablet TAKE 1 TABLET(1 MG) BY MOUTH DAILY 100 tablet 3   furosemide  (LASIX ) 40 MG tablet TAKE 1 TABLET BY MOUTH EVERY DAY 90 tablet 1   HYDROcodone -acetaminophen  (NORCO/VICODIN) 5-325 MG tablet Take 1 tablet by mouth 3 (three) times daily as needed for severe pain (pain score 7-10). 90 tablet 0   hydrOXYzine  (ATARAX ) 25 MG tablet Take 1 tablet (25 mg total) by mouth every 8 (eight) hours as needed for itching. 270 tablet 1   KLOR-CON  M20 20 MEQ tablet TAKE 1 TABLET BY MOUTH EVERY DAY 90 tablet 1   montelukast  (SINGULAIR ) 10 MG tablet TAKE 1 TABLET(10 MG) BY MOUTH DAILY 90 tablet 3   mycophenolate (CELLCEPT) 250 MG capsule Take 250 mg by mouth 2 (two) times daily.     prednisoLONE  acetate (PRED  FORTE) 1 % ophthalmic suspension 1 drop 2 (two) times daily.     zolpidem  (AMBIEN ) 10 MG tablet TAKE 1 TABLET(10 MG) BY MOUTH AT BEDTIME AS NEEDED FOR SLEEP 30 tablet 5   aspirin  EC 81 MG tablet Take 1 tablet (81 mg total) by mouth daily. (Patient not taking: Reported on 11/05/2023)     clonazePAM  (KLONOPIN ) 1 MG tablet Take 1 tablet by mouth daily. (Patient not taking: Reported on 11/05/2023)     Omega-3 Fatty Acids (FISH OIL ) 1000 MG CAPS Take 2 capsules (2,000 mg total) by mouth daily. (Patient not taking: Reported on 11/05/2023)     pantoprazole  (PROTONIX ) 40 MG tablet Take 1 tablet by mouth daily. (Patient not taking: Reported on 11/05/2023)     pravastatin  (PRAVACHOL ) 20 MG tablet Take 1 tablet by mouth daily. (Patient not taking: Reported on 11/05/2023)     valsartan  (DIOVAN ) 80 MG tablet Take 1 tablet by mouth daily. (Patient not taking: Reported on 11/05/2023)     Zinc  Sulfate 220 (50 Zn) MG TABS Take 1 tablet (220 mg total) by mouth daily. (Patient not taking: Reported on 11/05/2023) 30 tablet 0   No facility-administered medications prior to visit.    ROS: Review of Systems  Constitutional:  Negative  for activity change, appetite change, chills, fatigue and unexpected weight change.  HENT:  Negative for congestion, mouth sores and sinus pressure.   Eyes:  Negative for visual disturbance.  Respiratory:  Negative for cough and chest tightness.   Gastrointestinal:  Negative for abdominal pain and nausea.  Genitourinary:  Negative for difficulty urinating, frequency and vaginal pain.  Musculoskeletal:  Positive for arthralgias, back pain, neck pain and neck stiffness. Negative for gait problem.  Skin:  Negative for pallor and rash.  Neurological:  Negative for dizziness, tremors, weakness, numbness and headaches.  Psychiatric/Behavioral:  Negative for confusion, sleep disturbance and suicidal ideas.     Objective:  Ht 5' 5 (1.651 m)   Wt 160 lb (72.6 kg)   LMP 06/06/2015 Comment:  irregular   BMI 26.63 kg/m   BP Readings from Last 3 Encounters:  08/04/23 112/68  04/22/23 118/78  01/30/23 120/72    Wt Readings from Last 3 Encounters:  11/05/23 160 lb (72.6 kg)  08/04/23 163 lb 12.8 oz (74.3 kg)  04/22/23 150 lb (68 kg)    Physical Exam Constitutional:      General: She is not in acute distress.    Appearance: Normal appearance. She is well-developed.  HENT:     Head: Normocephalic.     Right Ear: External ear normal.     Left Ear: External ear normal.     Nose: Nose normal.  Eyes:     General:        Right eye: No discharge.        Left eye: No discharge.     Conjunctiva/sclera: Conjunctivae normal.     Pupils: Pupils are equal, round, and reactive to light.  Neck:     Thyroid : No thyromegaly.     Vascular: No JVD.     Trachea: No tracheal deviation.  Cardiovascular:     Rate and Rhythm: Normal rate and regular rhythm.     Heart sounds: Normal heart sounds.  Pulmonary:     Effort: No respiratory distress.     Breath sounds: No stridor. No wheezing.  Abdominal:     General: Bowel sounds are normal. There is no distension.     Palpations: Abdomen is soft. There is no mass.     Tenderness: There is no abdominal tenderness. There is no guarding or rebound.  Musculoskeletal:        General: Tenderness present.     Cervical back: Normal range of motion and neck supple. No rigidity.     Right lower leg: No edema.     Left lower leg: No edema.  Lymphadenopathy:     Cervical: No cervical adenopathy.  Skin:    Findings: No erythema or rash.  Neurological:     Mental Status: Mental status is at baseline.     Cranial Nerves: No cranial nerve deficit.     Motor: No abnormal muscle tone.     Coordination: Coordination normal.     Deep Tendon Reflexes: Reflexes normal.  Psychiatric:        Behavior: Behavior normal.        Thought Content: Thought content normal.        Judgment: Judgment normal.   R lat shin scar Good pulses, warm feet Pain  on palpation, w/ROM - multiple joints, spine Limping  Lab Results  Component Value Date   WBC 6.7 04/08/2022   HGB 11.1 (L) 04/08/2022   HCT 32.9 (L) 04/08/2022   PLT 275.0 04/08/2022  GLUCOSE 92 04/08/2022   CHOL 148 12/16/2019   TRIG 71 12/16/2019   HDL 70 12/16/2019   LDLDIRECT 160.7 02/06/2012   LDLCALC 63 12/16/2019   ALT 14 04/08/2022   AST 24 04/08/2022   NA 138 04/08/2022   K 4.2 04/08/2022   CL 102 04/08/2022   CREATININE 1.29 (H) 04/08/2022   BUN 21 04/08/2022   CO2 27 04/08/2022   TSH 1.65 09/16/2021   INR 1.4 (H) 12/08/2021   HGBA1C 5.5 09/16/2021    MM 3D DIAGNOSTIC MAMMOGRAM BILATERAL BREAST Result Date: 07/01/2023 CLINICAL DATA:  One year follow-up for probably benign left breast calcifications, initially assessed with diagnostic imaging on 06/26/2022. EXAM: DIGITAL DIAGNOSTIC BILATERAL MAMMOGRAM WITH TOMOSYNTHESIS AND CAD TECHNIQUE: Bilateral digital diagnostic mammography and breast tomosynthesis was performed. The images were evaluated with computer-aided detection. COMPARISON:  Previous exam(s). ACR Breast Density Category b: There are scattered areas of fibroglandular density. FINDINGS: Faint small group of calcifications in the upper outer left breast are stable. There are no masses, areas of significant asymmetry, areas of architectural distortion or new calcifications. IMPRESSION: 1. Probably benign left breast calcifications, stable for 1 year. RECOMMENDATION: Diagnostic bilateral mammography with left breast magnification views in 1 year to provide 2 years of stability for the probably benign left breast calcifications. I have discussed the findings and recommendations with the patient. If applicable, a reminder letter will be sent to the patient regarding the next appointment. BI-RADS CATEGORY  3: Probably benign. Electronically Signed   By: Alm Parkins M.D.   On: 07/01/2023 15:43    Assessment & Plan:   Problem List Items Addressed This Visit     CRI  (chronic renal insufficiency), stage 3 (moderate) (HCC) - Primary   GFR 48 D/c Naproxen  No NSAIDs      Essential hypertension   On Furosemide , KCl      Insomnia   Cont w/Zolpidem  prn  Potential benefits of a long term benzodiazepines  use as well as potential risks  and complications were explained to the patient and were aknowledged.      Low back pain radiating down leg    Cont w/Norco prn  Potential benefits of a long term opioids use as well as potential risks (i.e. addiction risk, apnea etc) and complications (i.e. Somnolence, constipation and others) were explained to the patient and were aknowledged.      Neuropathy   B feet, lateral aspects Good shoes, socks Podiatry ref was offered Blue-Emu cream was recommended to use 2-3 times a day Gabapentin option was discussed Try B complex       Vitamin D  deficiency   Cont w/Vit D         Meds ordered this encounter  Medications   b complex vitamins capsule    Sig: Take 1 capsule by mouth daily.      Follow-up: Return in about 3 months (around 02/05/2024) for a follow-up visit.  Marolyn Noel, MD

## 2023-11-05 NOTE — Assessment & Plan Note (Signed)
 GFR 48 D/c Naproxen  No NSAIDs

## 2023-11-05 NOTE — Assessment & Plan Note (Addendum)
 B feet, lateral aspects Good shoes, socks Podiatry ref was offered Blue-Emu cream was recommended to use 2-3 times a day Gabapentin option was discussed Try B complex

## 2023-11-05 NOTE — Assessment & Plan Note (Signed)
 On Furosemide, KCl

## 2023-11-13 ENCOUNTER — Other Ambulatory Visit: Payer: Self-pay | Admitting: Internal Medicine

## 2023-11-26 ENCOUNTER — Other Ambulatory Visit: Payer: Self-pay | Admitting: Internal Medicine

## 2023-11-26 DIAGNOSIS — M150 Primary generalized (osteo)arthritis: Secondary | ICD-10-CM

## 2023-11-26 NOTE — Telephone Encounter (Signed)
 Copied from CRM #[id number]. Topic: Clinical - Medication Refill >> Nov 26, 2023  9:47 AM Franky GRADE wrote: Medication: zolpidem  (AMBIEN ) 10 MG tablet [[id number]], HYDROcodone -acetaminophen  (NORCO/VICODIN) 5-325 MG tablet [[id number]]  Has the patient contacted their pharmacy? No (Agent: If no, request that the patient contact the pharmacy for the refill. If patient does not wish to contact the pharmacy document the reason why and proceed with request.) (Agent: If yes, when and what did the pharmacy advise?)  This is the patient's preferred pharmacy:  [id number] - [place], [place] - [address] [place] [address] Phone: [phone] Fax: [phone]  Is this the correct pharmacy for this prescription? Yes If no, delete pharmacy and type the correct one.   Has the prescription been filled recently? No  Is the patient out of the medication? Yes  Has the patient been seen for an appointment in the last year OR does the patient have an upcoming appointment? Yes  Can we respond through MyChart? No  Agent: Please be advised that Rx refills may take up to 3 business days. We ask that you follow-up with your pharmacy.

## 2023-11-26 NOTE — Telephone Encounter (Signed)
 Ambien : 06/01/23 30 tabs/5 RF Hydrocodone : 10/27/23 90 tabs/0 RF

## 2023-11-29 MED ORDER — ZOLPIDEM TARTRATE 10 MG PO TABS: ORAL_TABLET | ORAL | 5 refills | Status: AC

## 2023-11-29 MED ORDER — HYDROCODONE-ACETAMINOPHEN 5-325 MG PO TABS: 1.0000 | ORAL_TABLET | Freq: Three times a day (TID) | ORAL | 0 refills | Status: DC | PRN

## 2023-12-28 ENCOUNTER — Other Ambulatory Visit: Payer: Self-pay | Admitting: Internal Medicine

## 2023-12-28 DIAGNOSIS — M150 Primary generalized (osteo)arthritis: Secondary | ICD-10-CM

## 2023-12-28 NOTE — Telephone Encounter (Unsigned)
 Copied from CRM #[id number]. Topic: Clinical - Medication Refill >> Dec 28, 2023  8:22 AM Suzen RAMAN wrote: Medication: HYDROcodone -acetaminophen  (NORCO/VICODIN) 5-325 MG tablet   Has the patient contacted their pharmacy? Yes   This is the patient's preferred pharmacy:  [id number] - [place], [place] - [address] [place] [address] Phone: [phone] Fax: [phone]   Is this the correct pharmacy for this prescription? Yes If no, delete pharmacy and type the correct one.   Has the prescription been filled recently? No  Is the patient out of the medication? Yes  Has the patient been seen for an appointment in the last year OR does the patient have an upcoming appointment? Yes  Can we respond through MyChart? Yes  Agent: Please be advised that Rx refills may take up to 3 business days. We ask that you follow-up with your pharmacy.

## 2023-12-31 MED ORDER — HYDROCODONE-ACETAMINOPHEN 5-325 MG PO TABS: 1.0000 | ORAL_TABLET | Freq: Three times a day (TID) | ORAL | 0 refills | Status: DC | PRN

## 2024-01-28 ENCOUNTER — Other Ambulatory Visit: Payer: Self-pay | Admitting: Internal Medicine

## 2024-01-28 DIAGNOSIS — M150 Primary generalized (osteo)arthritis: Secondary | ICD-10-CM

## 2024-01-28 NOTE — Telephone Encounter (Signed)
 Copied from CRM [id number]. Topic: Clinical - Medication Refill >> Jan 28, 2024 10:34 AM Mercedes MATSU wrote: Medication: HYDROcodone -acetaminophen  (NORCO/VICODIN) 5-325 MG tablet  Has the patient contacted their pharmacy? Yes (Agent: If no, request that the patient contact the pharmacy for the refill. If patient does not wish to contact the pharmacy document the reason why and proceed with request.) (Agent: If yes, when and what did the pharmacy advise?)  This is the patient's preferred pharmacy:  [id number] - [place], [place] - [address] [place] [address] Phone: [phone] Fax: [phone]  Is this the correct pharmacy for this prescription? Yes If no, delete pharmacy and type the correct one.   Has the prescription been filled recently? Yes  Is the patient out of the medication? Yes  Has the patient been seen for an appointment in the last year OR does the patient have an upcoming appointment? Yes  Can we respond through MyChart? Yes  Agent: Please be advised that Rx refills may take up to 3 business days. We ask that you follow-up with your pharmacy.

## 2024-01-29 ENCOUNTER — Other Ambulatory Visit: Payer: Self-pay | Admitting: Family

## 2024-02-01 MED ORDER — HYDROCODONE-ACETAMINOPHEN 5-325 MG PO TABS
1.0000 | ORAL_TABLET | Freq: Three times a day (TID) | ORAL | 0 refills | Status: DC | PRN
Start: 2024-02-01 — End: 2024-03-02

## 2024-02-08 ENCOUNTER — Encounter: Payer: Self-pay | Admitting: Internal Medicine

## 2024-02-08 ENCOUNTER — Ambulatory Visit: Admitting: Internal Medicine

## 2024-02-08 VITALS — BP 136/78 | HR 81 | Temp 99.0°F | Ht 65.0 in | Wt 171.2 lb

## 2024-02-08 DIAGNOSIS — R110 Nausea: Secondary | ICD-10-CM

## 2024-02-08 DIAGNOSIS — E876 Hypokalemia: Secondary | ICD-10-CM

## 2024-02-08 DIAGNOSIS — I10 Essential (primary) hypertension: Secondary | ICD-10-CM | POA: Diagnosis not present

## 2024-02-08 DIAGNOSIS — R630 Anorexia: Secondary | ICD-10-CM | POA: Diagnosis not present

## 2024-02-08 DIAGNOSIS — M2550 Pain in unspecified joint: Secondary | ICD-10-CM

## 2024-02-08 MED ORDER — PANTOPRAZOLE SODIUM 40 MG PO TBEC: 40.0000 mg | DELAYED_RELEASE_TABLET | Freq: Every day | ORAL | 5 refills | Status: AC

## 2024-02-08 MED ORDER — POTASSIUM CHLORIDE ER 10 MEQ PO TBCR: 10.0000 meq | EXTENDED_RELEASE_TABLET | Freq: Two times a day (BID) | ORAL | 3 refills | Status: DC

## 2024-02-08 NOTE — Assessment & Plan Note (Signed)
 On Furosemide, KCl

## 2024-02-08 NOTE — Assessment & Plan Note (Signed)
 Chronic B knees, LBP Norco prn. Not to take w/Clonazepam , Zolpiden  Potential benefits of a long term opioids use as well as potential risks (i.e. addiction risk, apnea etc) and complications (i.e. Somnolence, constipation and others) were explained to the patient and were aknowledged.

## 2024-02-08 NOTE — Assessment & Plan Note (Signed)
?  etiology Check LFTs, lipase, abd US

## 2024-02-08 NOTE — Assessment & Plan Note (Signed)
 Klor con 10 meq 2 a day

## 2024-02-08 NOTE — Assessment & Plan Note (Addendum)
?  etiology Check LFTs, lipase, abd US

## 2024-02-08 NOTE — Progress Notes (Signed)
 Subjective:  Patient ID: Jessica Lynn, female    DOB: 05-20-1960  Age: 63 y.o. MRN: [id number]  CC: Medical Management of Chronic Issues (3 Month follow up. Intermittent left flank pain)   HPI [place] presents for LBP, OA, hypokalemia No appetite lately x 1 month. No n/v; no abd pain  Outpatient Medications Prior to Visit  Medication Sig Dispense Refill   b complex vitamins capsule Take 1 capsule by mouth daily.     Cholecalciferol (VITAMIN D3) 2000 units capsule Take 1 capsule (2,000 Units total) by mouth daily. 100 capsule 3   dorzolamide -timolol  (COSOPT ) 22.3-6.8 MG/ML ophthalmic solution Place 1 drop into the right eye 2 (two) times daily.     FEROSUL 325 (65 Fe) MG tablet TAKE 1 TABLET(325 MG) BY MOUTH DAILY 30 tablet 5   folic acid  (FOLVITE ) 1 MG tablet TAKE 1 TABLET(1 MG) BY MOUTH DAILY 100 tablet 3   furosemide  (LASIX ) 40 MG tablet TAKE 1 TABLET BY MOUTH EVERY DAY 90 tablet 1   montelukast  (SINGULAIR ) 10 MG tablet TAKE 1 TABLET(10 MG) BY MOUTH DAILY 90 tablet 3   mycophenolate (CELLCEPT) 250 MG capsule Take 250 mg by mouth 2 (two) times daily.     prednisoLONE  acetate (PRED FORTE ) 1 % ophthalmic suspension 1 drop 2 (two) times daily.     zolpidem  (AMBIEN ) 10 MG tablet TAKE 1 TABLET(10 MG) BY MOUTH AT BEDTIME AS NEEDED FOR SLEEP 30 tablet 5   HYDROcodone -acetaminophen  (NORCO/VICODIN) 5-325 MG tablet Take 1 tablet by mouth 3 (three) times daily as needed for severe pain (pain score 7-10). 90 tablet 0   hydrOXYzine  (ATARAX ) 25 MG tablet TAKE 1 TABLET(25 MG) BY MOUTH EVERY 8 HOURS AS NEEDED FOR ITCHING 270 tablet 1   KLOR-CON  M20 20 MEQ tablet TAKE 1 TABLET BY MOUTH EVERY DAY 90 tablet 1   No facility-administered medications prior to visit.    ROS: Review of Systems  Constitutional:  Positive for fatigue. Negative for activity change, appetite change, chills and unexpected weight change.  HENT:  Negative for congestion, mouth sores and sinus pressure.   Eyes:   Negative for visual disturbance.  Respiratory:  Negative for cough and chest tightness.   Gastrointestinal:  Negative for abdominal pain and nausea.  Genitourinary:  Negative for difficulty urinating, frequency and vaginal pain.  Musculoskeletal:  Positive for back pain. Negative for gait problem.  Skin:  Positive for color change. Negative for pallor and rash.  Neurological:  Negative for dizziness, tremors, weakness, numbness and headaches.  Psychiatric/Behavioral:  Negative for confusion and sleep disturbance.     Objective:  BP 136/78   Pulse 81   Temp 99 F (37.2 C)   Ht 5' 5 (1.651 m)   Wt 171 lb 3.2 oz (77.7 kg)   LMP 06/06/2015 Comment: irregular   SpO2 99%   BMI 28.49 kg/m   BP Readings from Last 3 Encounters:  02/08/24 136/78  08/04/23 112/68  04/22/23 118/78    Wt Readings from Last 3 Encounters:  02/08/24 171 lb 3.2 oz (77.7 kg)  11/05/23 160 lb (72.6 kg)  08/04/23 163 lb 12.8 oz (74.3 kg)    Physical Exam Constitutional:      General: She is not in acute distress.    Appearance: Normal appearance. She is well-developed.  HENT:     Head: Normocephalic.     Right Ear: External ear normal.     Left Ear: External ear normal.     Nose: Nose  normal.  Eyes:     General:        Right eye: No discharge.        Left eye: No discharge.     Conjunctiva/sclera: Conjunctivae normal.     Pupils: Pupils are equal, round, and reactive to light.  Neck:     Thyroid : No thyromegaly.     Vascular: No JVD.     Trachea: No tracheal deviation.  Cardiovascular:     Rate and Rhythm: Normal rate and regular rhythm.     Heart sounds: Normal heart sounds.  Pulmonary:     Effort: No respiratory distress.     Breath sounds: No stridor. No wheezing.  Abdominal:     General: Bowel sounds are normal. There is no distension.     Palpations: Abdomen is soft. There is no mass.     Tenderness: There is no abdominal tenderness. There is no guarding or rebound.  Musculoskeletal:         General: Tenderness present.     Cervical back: Normal range of motion and neck supple. No rigidity.     Right lower leg: No edema.     Left lower leg: No edema.  Lymphadenopathy:     Cervical: No cervical adenopathy.  Skin:    Findings: Lesion present. No erythema or rash.  Neurological:     Cranial Nerves: No cranial nerve deficit.     Motor: No abnormal muscle tone.     Coordination: Coordination normal.     Deep Tendon Reflexes: Reflexes normal.  Psychiatric:        Behavior: Behavior normal.        Thought Content: Thought content normal.        Judgment: Judgment normal.     Lab Results  Component Value Date   WBC 7.1 02/09/2024   HGB 11.2 (L) 02/09/2024   HCT 33.3 (L) 02/09/2024   PLT 257.0 02/09/2024   GLUCOSE 100 (H) 02/09/2024   CHOL 148 12/16/2019   TRIG 71 12/16/2019   HDL 70 12/16/2019   LDLDIRECT 160.7 02/06/2012   LDLCALC 63 12/16/2019   ALT 11 02/09/2024   AST 18 02/09/2024   NA 136 02/09/2024   K 3.9 02/09/2024   CL 101 02/09/2024   CREATININE 1.04 02/09/2024   BUN 14 02/09/2024   CO2 28 02/09/2024   TSH 1.65 09/16/2021   INR 1.4 (H) 12/08/2021   HGBA1C 5.5 09/16/2021    MM 3D DIAGNOSTIC MAMMOGRAM BILATERAL BREAST Result Date: 07/01/2023 CLINICAL DATA:  One year follow-up for probably benign left breast calcifications, initially assessed with diagnostic imaging on 06/26/2022. EXAM: DIGITAL DIAGNOSTIC BILATERAL MAMMOGRAM WITH TOMOSYNTHESIS AND CAD TECHNIQUE: Bilateral digital diagnostic mammography and breast tomosynthesis was performed. The images were evaluated with computer-aided detection. COMPARISON:  Previous exam(s). ACR Breast Density Category b: There are scattered areas of fibroglandular density. FINDINGS: Faint small group of calcifications in the upper outer left breast are stable. There are no masses, areas of significant asymmetry, areas of architectural distortion or new calcifications. IMPRESSION: 1. Probably benign left breast  calcifications, stable for 1 year. RECOMMENDATION: Diagnostic bilateral mammography with left breast magnification views in 1 year to provide 2 years of stability for the probably benign left breast calcifications. I have discussed the findings and recommendations with the patient. If applicable, a reminder letter will be sent to the patient regarding the next appointment. BI-RADS CATEGORY  3: Probably benign. Electronically Signed   By: Alm Parkins M.D.   On:  07/01/2023 15:43    Assessment & Plan:   Problem List Items Addressed This Visit     Appetite loss   ?etiology Check LFTs, lipase, abd US       Relevant Orders   Comprehensive metabolic panel with GFR (Completed)   CBC with Differential/Platelet (Completed)   Sedimentation rate (Completed)   T4, free (Completed)   Urinalysis (Completed)   Lipase (Completed)   US  Abdomen Complete (Completed)   Arthralgia   Chronic B knees, LBP Norco prn. Not to take w/Clonazepam , Zolpiden  Potential benefits of a long term opioids use as well as potential risks (i.e. addiction risk, apnea etc) and complications (i.e. Somnolence, constipation and others) were explained to the patient and were aknowledged.      Essential hypertension - Primary   On Furosemide , KCl      Hypokalemia   Klor con 10 meq 2 a day      Relevant Orders   Comprehensive metabolic panel with GFR (Completed)   Nausea   ?etiology Check LFTs, lipase, abd US       Relevant Orders   Comprehensive metabolic panel with GFR (Completed)   CBC with Differential/Platelet (Completed)   Sedimentation rate (Completed)   T4, free (Completed)   Urinalysis (Completed)   Lipase (Completed)   US  Abdomen Complete (Completed)      Meds ordered this encounter  Medications   potassium chloride  (KLOR-CON  10) 10 MEQ tablet    Sig: Take 1 tablet (10 mEq total) by mouth in the morning and at bedtime.    Dispense:  180 tablet    Refill:  3   pantoprazole  (PROTONIX ) 40 MG tablet     Sig: Take 1 tablet (40 mg total) by mouth daily.    Dispense:  30 tablet    Refill:  5      Follow-up: Return in about 4 weeks (around 03/07/2024) for a follow-up visit.  Marolyn Noel, MD

## 2024-02-09 ENCOUNTER — Other Ambulatory Visit

## 2024-02-09 DIAGNOSIS — R630 Anorexia: Secondary | ICD-10-CM

## 2024-02-09 DIAGNOSIS — E876 Hypokalemia: Secondary | ICD-10-CM | POA: Diagnosis not present

## 2024-02-09 DIAGNOSIS — R110 Nausea: Secondary | ICD-10-CM | POA: Diagnosis not present

## 2024-02-09 LAB — COMPREHENSIVE METABOLIC PANEL WITH GFR
ALT: 11 U/L (ref 0–35)
AST: 18 U/L (ref 0–37)
Albumin: 4 g/dL (ref 3.5–5.2)
Alkaline Phosphatase: 76 U/L (ref 39–117)
BUN: 14 mg/dL (ref 6–23)
CO2: 28 meq/L (ref 19–32)
Calcium: 9 mg/dL (ref 8.4–10.5)
Chloride: 101 meq/L (ref 96–112)
Creatinine, Ser: 1.04 mg/dL (ref 0.40–1.20)
GFR: 57.81 mL/min — ABNORMAL LOW (ref >60.00)
Glucose, Bld: 100 mg/dL — ABNORMAL HIGH (ref 70–99)
Potassium: 3.9 meq/L (ref 3.5–5.1)
Sodium: 136 meq/L (ref 135–145)
Total Bilirubin: 0.5 mg/dL (ref 0.2–1.2)
Total Protein: 7.4 g/dL (ref 6.0–8.3)

## 2024-02-09 LAB — CBC WITH DIFFERENTIAL/PLATELET
Basophils Absolute: 0.1 K/uL (ref 0.0–0.1)
Basophils Relative: 0.8 % (ref 0.0–3.0)
Eosinophils Absolute: 0.1 K/uL (ref 0.0–0.7)
Eosinophils Relative: 0.7 % (ref 0.0–5.0)
HCT: 33.3 % — ABNORMAL LOW (ref 36.0–46.0)
Hemoglobin: 11.2 g/dL — ABNORMAL LOW (ref 12.0–15.0)
Lymphocytes Relative: 35.5 % (ref 12.0–46.0)
Lymphs Abs: 2.5 K/uL (ref 0.7–4.0)
MCHC: 33.7 g/dL (ref 30.0–36.0)
MCV: 90.1 fl (ref 78.0–100.0)
Monocytes Absolute: 0.6 K/uL (ref 0.1–1.0)
Monocytes Relative: 8.3 % (ref 3.0–12.0)
Neutro Abs: 3.9 K/uL (ref 1.4–7.7)
Neutrophils Relative %: 54.7 % (ref 43.0–77.0)
Platelets: 257 K/uL (ref 150.0–400.0)
RBC: 3.7 Mil/uL — ABNORMAL LOW (ref 3.87–5.11)
RDW: 12.8 % (ref 11.5–15.5)
WBC: 7.1 K/uL (ref 4.0–10.5)

## 2024-02-09 LAB — LIPASE: Lipase: 30 U/L (ref 11.0–59.0)

## 2024-02-09 LAB — URINALYSIS
Bilirubin Urine: NEGATIVE
Hgb urine dipstick: NEGATIVE
Ketones, ur: NEGATIVE
Leukocytes,Ua: NEGATIVE
Nitrite: NEGATIVE
Specific Gravity, Urine: 1.01 (ref 1.000–1.030)
Total Protein, Urine: NEGATIVE
Urine Glucose: NEGATIVE
Urobilinogen, UA: 0.2 (ref 0.0–1.0)
pH: 6.5 (ref 5.0–8.0)

## 2024-02-09 LAB — SEDIMENTATION RATE: Sed Rate: 15 mm/h (ref 0–30)

## 2024-02-10 LAB — T4, FREE: Free T4: 0.61 ng/dL (ref 0.60–1.60)

## 2024-02-14 ENCOUNTER — Ambulatory Visit: Payer: Self-pay | Admitting: Internal Medicine

## 2024-02-16 ENCOUNTER — Ambulatory Visit
Admission: RE | Admit: 2024-02-16 | Discharge: 2024-02-16 | Disposition: A | Source: Ambulatory Visit | Attending: Internal Medicine | Admitting: Internal Medicine

## 2024-02-17 ENCOUNTER — Other Ambulatory Visit: Payer: Self-pay | Admitting: Internal Medicine

## 2024-02-29 ENCOUNTER — Telehealth: Payer: Self-pay | Admitting: Internal Medicine

## 2024-02-29 DIAGNOSIS — M150 Primary generalized (osteo)arthritis: Secondary | ICD-10-CM

## 2024-02-29 NOTE — Telephone Encounter (Unsigned)
 Copied from CRM #[id number]. Topic: Clinical - Medication Refill >> Feb 29, 2024  8:33 AM Harlene ORN wrote: Medication: oxyCODONE -acetaminophen  (PERCOCET) 10-325 MG per tablet  Has the patient contacted their pharmacy? No (Agent: If no, request that the patient contact the pharmacy for the refill. If patient does not wish to contact the pharmacy document the reason why and proceed with request.) (Agent: If yes, when and what did the pharmacy advise?)  This is the patient's preferred pharmacy:  [id number] - [place], [place] - [address] [place]  [address] Phone: [phone] Fax: [phone]  Is this the correct pharmacy for this prescription? Yes If no, delete pharmacy and type the correct one.   Has the prescription been filled recently? No  Is the patient out of the medication? Yes  Has the patient been seen for an appointment in the last year OR does the patient have an upcoming appointment? Yes  Can we respond through MyChart? Yes  Agent: Please be advised that Rx refills may take up to 3 business days. We ask that you follow-up with your pharmacy.

## 2024-03-02 MED ORDER — HYDROCODONE-ACETAMINOPHEN 5-325 MG PO TABS: 1.0000 | ORAL_TABLET | Freq: Three times a day (TID) | ORAL | 0 refills | Status: DC | PRN

## 2024-03-02 NOTE — Telephone Encounter (Signed)
 Done. Thanks.

## 2024-03-05 ENCOUNTER — Encounter: Payer: Self-pay | Admitting: Internal Medicine

## 2024-03-06 ENCOUNTER — Encounter: Payer: Self-pay | Admitting: Internal Medicine

## 2024-03-09 ENCOUNTER — Ambulatory Visit: Admitting: Internal Medicine

## 2024-03-09 ENCOUNTER — Encounter: Payer: Self-pay | Admitting: Internal Medicine

## 2024-03-09 VITALS — BP 120/70 | HR 76 | Temp 97.5°F | Ht 65.0 in | Wt 180.0 lb

## 2024-03-09 DIAGNOSIS — G5603 Carpal tunnel syndrome, bilateral upper limbs: Secondary | ICD-10-CM

## 2024-03-09 DIAGNOSIS — E559 Vitamin D deficiency, unspecified: Secondary | ICD-10-CM

## 2024-03-09 DIAGNOSIS — I10 Essential (primary) hypertension: Secondary | ICD-10-CM

## 2024-03-09 DIAGNOSIS — M545 Low back pain, unspecified: Secondary | ICD-10-CM

## 2024-03-09 DIAGNOSIS — F419 Anxiety disorder, unspecified: Secondary | ICD-10-CM

## 2024-03-09 MED ORDER — HYDROCODONE-ACETAMINOPHEN 7.5-325 MG PO TABS: 1.0000 | ORAL_TABLET | Freq: Four times a day (QID) | ORAL | 0 refills | Status: DC | PRN

## 2024-03-09 MED ORDER — METHOCARBAMOL 500 MG PO TABS: 500.0000 mg | ORAL_TABLET | Freq: Three times a day (TID) | ORAL | 1 refills | Status: DC | PRN

## 2024-03-09 NOTE — Progress Notes (Unsigned)
 Subjective:  Patient ID: Jessica Lynn, female    DOB: 02/14/1961  Age: 63 y.o. MRN: [id number]  CC: Hypertension (4wk follow up; still got the same pain going on (pinched nerve in back) but nothing new)   HPI Jessica Lynn presents for loss of appetite C/o pain in the legs worse:  at times LLE>>RLE   Outpatient Medications Prior to Visit  Medication Sig Dispense Refill  . b complex vitamins capsule Take 1 capsule by mouth daily.    . Cholecalciferol (VITAMIN D3) 2000 units capsule Take 1 capsule (2,000 Units total) by mouth daily. 100 capsule 3  . dorzolamide -timolol  (COSOPT ) 22.3-6.8 MG/ML ophthalmic solution Place 1 drop into the right eye 2 (two) times daily.    . FEROSUL 325 (65 Fe) MG tablet TAKE 1 TABLET(325 MG) BY MOUTH DAILY 30 tablet 5  . folic acid  (FOLVITE ) 1 MG tablet TAKE 1 TABLET(1 MG) BY MOUTH DAILY 100 tablet 3  . furosemide  (LASIX ) 40 MG tablet TAKE 1 TABLET BY MOUTH EVERY DAY 90 tablet 1  . hydrOXYzine  (ATARAX ) 25 MG tablet TAKE 1 TABLET(25 MG) BY MOUTH EVERY 8 HOURS AS NEEDED FOR ITCHING 270 tablet 1  . montelukast  (SINGULAIR ) 10 MG tablet TAKE 1 TABLET(10 MG) BY MOUTH DAILY 90 tablet 3  . mycophenolate (CELLCEPT) 250 MG capsule Take 250 mg by mouth 2 (two) times daily.    . pantoprazole  (PROTONIX ) 40 MG tablet Take 1 tablet (40 mg total) by mouth daily. 30 tablet 5  . potassium chloride  (KLOR-CON  10) 10 MEQ tablet Take 1 tablet (10 mEq total) by mouth in the morning and at bedtime. 180 tablet 3  . prednisoLONE  acetate (PRED FORTE ) 1 % ophthalmic suspension 1 drop 2 (two) times daily.    . zolpidem  (AMBIEN ) 10 MG tablet TAKE 1 TABLET(10 MG) BY MOUTH AT BEDTIME AS NEEDED FOR SLEEP 30 tablet 5  . HYDROcodone -acetaminophen  (NORCO/VICODIN) 5-325 MG tablet Take 1 tablet by mouth 3 (three) times daily as needed for severe pain (pain score 7-10). 90 tablet 0   No facility-administered medications prior to visit.    ROS: Review of Systems  Constitutional:  Negative  for activity change, appetite change, chills, fatigue and unexpected weight change.  HENT:  Negative for congestion, mouth sores and sinus pressure.   Eyes:  Negative for visual disturbance.  Respiratory:  Negative for cough and chest tightness.   Gastrointestinal:  Negative for abdominal pain and nausea.  Genitourinary:  Negative for difficulty urinating, frequency and vaginal pain.  Musculoskeletal:  Positive for arthralgias, back pain and gait problem.  Skin:  Positive for color change and rash. Negative for pallor.  Neurological:  Negative for dizziness, tremors, weakness, numbness and headaches.  Psychiatric/Behavioral:  Positive for decreased concentration. Negative for confusion, sleep disturbance and suicidal ideas.     Objective:  BP 120/70   Pulse 76   Temp (!) 97.5 F (36.4 C)   Ht 5' 5 (1.651 m)   Wt 180 lb (81.6 kg)   LMP 06/06/2015 Comment: irregular   SpO2 98%   BMI 29.95 kg/m   BP Readings from Last 3 Encounters:  03/09/24 120/70  02/08/24 136/78  08/04/23 112/68    Wt Readings from Last 3 Encounters:  03/09/24 180 lb (81.6 kg)  02/08/24 171 lb 3.2 oz (77.7 kg)  11/05/23 160 lb (72.6 kg)    Physical Exam Constitutional:      General: She is not in acute distress.    Appearance: She is well-developed.  She is obese.  HENT:     Head: Normocephalic.     Right Ear: External ear normal.     Left Ear: External ear normal.     Nose: Nose normal.  Eyes:     General:        Right eye: No discharge.        Left eye: No discharge.     Conjunctiva/sclera: Conjunctivae normal.     Pupils: Pupils are equal, round, and reactive to light.  Neck:     Thyroid : No thyromegaly.     Vascular: No JVD.     Trachea: No tracheal deviation.  Cardiovascular:     Rate and Rhythm: Normal rate and regular rhythm.     Heart sounds: Normal heart sounds.  Pulmonary:     Effort: No respiratory distress.     Breath sounds: No stridor. No wheezing.  Abdominal:     General:  Bowel sounds are normal. There is no distension.     Palpations: Abdomen is soft. There is no mass.     Tenderness: There is no abdominal tenderness. There is no guarding or rebound.  Musculoskeletal:        General: No tenderness.     Cervical back: Normal range of motion and neck supple. No rigidity.  Lymphadenopathy:     Cervical: No cervical adenopathy.  Skin:    Findings: No erythema or rash.  Neurological:     Cranial Nerves: No cranial nerve deficit.     Motor: No abnormal muscle tone.     Coordination: Coordination normal.     Deep Tendon Reflexes: Reflexes normal.  Psychiatric:        Behavior: Behavior normal.        Thought Content: Thought content normal.        Judgment: Judgment normal.   Str leg elev (-) L and R NT Very stiff spine, hips  Lab Results  Component Value Date   WBC 7.1 02/09/2024   HGB 11.2 (L) 02/09/2024   HCT 33.3 (L) 02/09/2024   PLT 257.0 02/09/2024   GLUCOSE 100 (H) 02/09/2024   CHOL 148 12/16/2019   TRIG 71 12/16/2019   HDL 70 12/16/2019   LDLDIRECT 160.7 02/06/2012   LDLCALC 63 12/16/2019   ALT 11 02/09/2024   AST 18 02/09/2024   NA 136 02/09/2024   K 3.9 02/09/2024   CL 101 02/09/2024   CREATININE 1.04 02/09/2024   BUN 14 02/09/2024   CO2 28 02/09/2024   TSH 1.65 09/16/2021   INR 1.4 (H) 12/08/2021   HGBA1C 5.5 09/16/2021    US  Abdomen Complete Addendum Date: 03/08/2024 ******** ADDENDUM #1 ******** ADDENDUM: The infrahepatic IVC was not well visualized, as it was obscured by overlying bowel gas. ---------------------------------------------------- Electronically signed by: Rogelia Myers MD 03/08/2024 10:46 PM EST RP Workstation: [id number]   Result Date: 03/08/2024 ******** ORIGINAL REPORT ******** EXAM: COMPLETE ABDOMINAL ULTRASOUND TECHNIQUE: Real-time ultrasonography of the abdomen was performed. COMPARISON: 12/11/2021 CLINICAL HISTORY: No appetite, nausea, LBP x 1 month. Thx. FINDINGS: LIVER: The liver demonstrates normal  echogenicity. No intrahepatic biliary ductal dilatation. No mass. BILIARY SYSTEM: No pericholecystic fluid or wall thickening. No cholelithiasis. Common bile duct is nondilated, measuring 3 mm. KIDNEYS: Normal size and contour of kidneys. Normal cortical echogenicity. Isoechoic nodular contour along the interpolar region of the left kidney, measuring 2.6 cm. The exophytic cyst in the left kidney on the prior study is not visualized on today's ultrasound. No hydronephrosis. No calculus. No mass.  PANCREAS: Visualized portions of the pancreas are unremarkable. SPLEEN: No acute abnormality. Spleen is within normal limits in size. VESSELS: Visualized portion of the aorta is normal with scattered aortic atherosclerosis. OTHER: No ascites. IMPRESSION: 1. No acute sonographic abnormality on this complete abdominal ultrasound. 2. Isoechoic nodular contour along the interpolar region of the left kidney, measuring 2.6 cm, which may represent normal renal contour vs. a prominent column of Bertin. A non-emergent multiphase MRI of the abdomen with IV contrast is recommended to exclude underlying neoplasm. Electronically signed by: Rogelia Myers MD 02/16/2024 01:04 PM EST RP Workstation: [id number]    Assessment & Plan:   Problem List Items Addressed This Visit     Anxiety disorder    Cont w/Clonazepam  prn  Potential benefits of a long term benzodiazepines  use as well as potential risks  and complications were explained to the patient and were aknowledged. Not to take w/Norco      CTS (carpal tunnel syndrome)   Worse Use splints      Relevant Medications   methocarbamol  (ROBAXIN ) 500 MG tablet   Essential hypertension   On Furosemide , KCl      Low back pain radiating down leg - Primary   Worse Increase Norco to 7.5/325 tid  Potential benefits of a long term opioids use as well as potential risks (i.e. addiction risk, apnea etc) and complications (i.e. Somnolence, constipation and others) were  explained to the patient and were aknowledged. Robaxin  prn            Relevant Medications   HYDROcodone -acetaminophen  (NORCO) 7.5-325 MG tablet   methocarbamol  (ROBAXIN ) 500 MG tablet   Vitamin D  deficiency   On Vit D         Meds ordered this encounter  Medications  . HYDROcodone -acetaminophen  (NORCO) 7.5-325 MG tablet    Sig: Take 1 tablet by mouth every 6 (six) hours as needed for moderate pain (pain score 4-6).    Dispense:  90 tablet    Refill:  0  . methocarbamol  (ROBAXIN ) 500 MG tablet    Sig: Take 1 tablet (500 mg total) by mouth every 8 (eight) hours as needed for muscle spasms.    Dispense:  90 tablet    Refill:  1      Follow-up: Return in about 6 weeks (around 04/20/2024) for a follow-up visit.  Marolyn Noel, MD

## 2024-03-09 NOTE — Assessment & Plan Note (Signed)
  Cont w/Clonazepam prn  Potential benefits of a long term benzodiazepines  use as well as potential risks  and complications were explained to the patient and were aknowledged. Not to take w/Norco

## 2024-03-09 NOTE — Assessment & Plan Note (Signed)
 On Vit D

## 2024-03-09 NOTE — Assessment & Plan Note (Signed)
 On Furosemide, KCl

## 2024-03-09 NOTE — Assessment & Plan Note (Signed)
 Worse Increase Norco to 7.5/325 tid  Potential benefits of a long term opioids use as well as potential risks (i.e. addiction risk, apnea etc) and complications (i.e. Somnolence, constipation and others) were explained to the patient and were aknowledged. Robaxin  prn

## 2024-03-09 NOTE — Assessment & Plan Note (Signed)
 Worse Use splints

## 2024-03-14 ENCOUNTER — Ambulatory Visit

## 2024-03-14 VITALS — Ht 65.0 in | Wt 165.0 lb

## 2024-03-14 DIAGNOSIS — Z860100 Personal history of colon polyps, unspecified: Secondary | ICD-10-CM

## 2024-03-14 DIAGNOSIS — Z800 Family history of malignant neoplasm of digestive organs: Secondary | ICD-10-CM

## 2024-03-14 DIAGNOSIS — Z8371 Family history of adenomatous and serrated polyps: Secondary | ICD-10-CM

## 2024-03-14 MED ORDER — NA SULFATE-K SULFATE-MG SULF 17.5-3.13-1.6 GM/177ML PO SOLN: 1.0000 | Freq: Once | ORAL | 0 refills | Status: AC

## 2024-03-14 NOTE — Progress Notes (Signed)
 Pt's name and DOB verified at the beginning of the pre-visit with 2 identifiers  Pt denies any difficulty with ambulating,sitting, laying down or rolling side to side  Pt has no issues moving head neck or swallowing  No egg or soy allergy known to patient   No issues known to pt with past sedation  No FH of Malignant Hyperthermia  Pt is not on home 02   Pt is not on blood thinners   Pt denies issues with constipation   Pt is not on dialysis  Pt denise any abnormal heart rhythms   Pt denies any upcoming cardiac testing    Chart not reviewed by CRNA prior to PV  Visit by phone  Pt states weight is 165 lb   Pt given  both [facility] main # and MD on call # prior to instructions.  Informed pt to come in at the time discussed and is shown on PV instructions.  Pt instructed to use Singlecare.com or GoodRx for a price reduction on prep  Instructed pt where to find PV instructions in My Chart. . Instructed pt on all aspects of written instructions including med holds clothing to wear and foods to eat and not eat as well as after procedure legal restrictions and to call MD on call if needed.. Pt states understanding. Instructed pt to review instructions again prior to procedure and call main # given if has any questions or any issues. Pt states they will.

## 2024-03-25 ENCOUNTER — Encounter: Payer: Self-pay | Admitting: Internal Medicine

## 2024-04-05 ENCOUNTER — Encounter: Payer: Self-pay | Admitting: Internal Medicine

## 2024-04-05 ENCOUNTER — Ambulatory Visit: Admitting: Internal Medicine

## 2024-04-05 VITALS — BP 137/84 | HR 80 | Temp 97.4°F | Ht 65.0 in | Wt 165.0 lb

## 2024-04-05 DIAGNOSIS — Z860100 Personal history of colon polyps, unspecified: Secondary | ICD-10-CM

## 2024-04-05 MED ORDER — FLEET ENEMA RE ENEM
1.0000 | ENEMA | Freq: Once | RECTAL | Status: AC
  Administered 2024-04-05: 1 via RECTAL

## 2024-04-05 MED ORDER — SODIUM CHLORIDE 0.9 % IV SOLN: 500.0000 mL | Freq: Once | INTRAVENOUS | Status: DC

## 2024-04-05 NOTE — Progress Notes (Signed)
 Patient's prep not adequate today; stool was cloudy with brownish flakes.  She admitted to eating yams the day prior at 1600 and 2 small muffins at 0800 the day prior.  Upon further assessment she admitted to drinking a bottle of water in our waiting room which was less than 2 hours ago.  Dr. Abran notified and will not proceed with this procedure today.  Patient verbalized understanding of the cancellation today and will reschedule for January 26 at 1130.  Samantha scheduled appointment and reviewed the prep instructions.

## 2024-04-20 ENCOUNTER — Ambulatory Visit

## 2024-04-20 VITALS — Ht 65.0 in | Wt 160.0 lb

## 2024-04-20 DIAGNOSIS — Z860100 Personal history of colon polyps, unspecified: Secondary | ICD-10-CM

## 2024-04-20 MED ORDER — NA SULFATE-K SULFATE-MG SULF 17.5-3.13-1.6 GM/177ML PO SOLN: 1.0000 | Freq: Once | ORAL | 0 refills | Status: AC

## 2024-04-20 NOTE — Progress Notes (Signed)
 RN REVIEWED PREP INSTRUCTIONS THOROUGHLY. PT VERBALIZES UNDERSTANDING. PREP INSTRUCTIONS SENT VIA MY CHART AND MAIL. RN REITERATED TO CALL [facility] IF ANY FURTHER QUESTIONS/CLARIFICATION NEEDED ON ANYTHING. PATIENT VERBALIZES UNDERSTANDING.  No egg or soy allergy known to patient  No issues known to pt with past sedation with any surgeries or procedures- TROUBLE TO AROUSE IN RECOVERY PER PT.  Patient denies ever being told they had issues or difficulty with intubation  No FH of Malignant Hyperthermia Pt is not on diet pills Pt is not on  home 02  Pt is not on blood thinners  Pt denies issues with constipation  No A fib or A flutter Have any cardiac testing pending--NO Pt can ambulate-INDEPENDENTLY  Pt denies use of chewing tobacco Discussed diabetic I weight loss medication holds Discussed NSAID holds Checked BMI Pt instructed to use Singlecare.com or GoodRx for a price reduction on prep  Patient's chart reviewed by Norleen Schillings CNRA prior to previsit and patient appropriate for the [facility].  Pre visit completed and red dot placed by patient's name on their procedure day (on provider's schedule).

## 2024-04-21 ENCOUNTER — Encounter: Payer: Self-pay | Admitting: Internal Medicine

## 2024-04-26 ENCOUNTER — Ambulatory Visit: Admitting: Internal Medicine

## 2024-04-26 ENCOUNTER — Encounter: Payer: Self-pay | Admitting: Internal Medicine

## 2024-04-26 VITALS — BP 116/80 | HR 83 | Ht 65.0 in | Wt 178.0 lb

## 2024-04-26 DIAGNOSIS — E559 Vitamin D deficiency, unspecified: Secondary | ICD-10-CM | POA: Diagnosis not present

## 2024-04-26 DIAGNOSIS — I10 Essential (primary) hypertension: Secondary | ICD-10-CM | POA: Diagnosis not present

## 2024-04-26 DIAGNOSIS — M545 Low back pain, unspecified: Secondary | ICD-10-CM

## 2024-04-26 DIAGNOSIS — M2550 Pain in unspecified joint: Secondary | ICD-10-CM

## 2024-04-26 DIAGNOSIS — M150 Primary generalized (osteo)arthritis: Secondary | ICD-10-CM | POA: Diagnosis not present

## 2024-04-26 DIAGNOSIS — M79606 Pain in leg, unspecified: Secondary | ICD-10-CM

## 2024-04-26 MED ORDER — HYDROCODONE-ACETAMINOPHEN 7.5-325 MG PO TABS: 1.0000 | ORAL_TABLET | Freq: Four times a day (QID) | ORAL | 0 refills | Status: AC | PRN

## 2024-04-26 NOTE — Assessment & Plan Note (Signed)
 Better on Norco to 7.5/325 tid  Potential benefits of a long term opioids use as well as potential risks (i.e. addiction risk, apnea etc) and complications (i.e. Somnolence, constipation and others) were explained to the patient and were aknowledged. Robaxin  prn

## 2024-04-26 NOTE — Assessment & Plan Note (Signed)
 On Vit D

## 2024-04-26 NOTE — Assessment & Plan Note (Signed)
 Chronic B knees, LBP Better on Norco to 7.5/325 tid  Potential benefits of a long term opioids use as well as potential risks (i.e. addiction risk, apnea etc) and complications (i.e. Somnolence, constipation and others) were explained to the patient and were aknowledged. Robaxin  intolerant - d/c

## 2024-04-26 NOTE — Assessment & Plan Note (Signed)
 On Furosemide, KCl

## 2024-04-26 NOTE — Progress Notes (Signed)
 "  Subjective:  Patient ID: Jessica Lynn, female    DOB: 1961-02-05  Age: 64 y.o. MRN: [id number]  CC: Medical Management of Chronic Issues (6 Week follow up)   HPI Jessica Lynn presents for chronic pain, OA, HTN   Outpatient Medications Prior to Visit  Medication Sig Dispense Refill   Cholecalciferol (VITAMIN D3) 2000 units capsule Take 1 capsule (2,000 Units total) by mouth daily. 100 capsule 3   FEROSUL 325 (65 Fe) MG tablet TAKE 1 TABLET(325 MG) BY MOUTH DAILY 30 tablet 5   folic acid  (FOLVITE ) 1 MG tablet TAKE 1 TABLET(1 MG) BY MOUTH DAILY 100 tablet 3   furosemide  (LASIX ) 40 MG tablet TAKE 1 TABLET BY MOUTH EVERY DAY 90 tablet 1   hydrOXYzine  (ATARAX ) 25 MG tablet TAKE 1 TABLET(25 MG) BY MOUTH EVERY 8 HOURS AS NEEDED FOR ITCHING 270 tablet 1   montelukast  (SINGULAIR ) 10 MG tablet TAKE 1 TABLET(10 MG) BY MOUTH DAILY 90 tablet 3   potassium chloride  (KLOR-CON  10) 10 MEQ tablet Take 1 tablet (10 mEq total) by mouth in the morning and at bedtime. 180 tablet 3   zolpidem  (AMBIEN ) 10 MG tablet TAKE 1 TABLET(10 MG) BY MOUTH AT BEDTIME AS NEEDED FOR SLEEP 30 tablet 5   HYDROcodone -acetaminophen  (NORCO) 7.5-325 MG tablet Take 1 tablet by mouth every 6 (six) hours as needed for moderate pain (pain score 4-6). 90 tablet 0   b complex vitamins capsule Take 1 capsule by mouth daily. (Patient not taking: Reported on 04/26/2024)     dorzolamide -timolol  (COSOPT ) 22.3-6.8 MG/ML ophthalmic solution Place 1 drop into the right eye 2 (two) times daily. (Patient not taking: Reported on 04/26/2024)     pantoprazole  (PROTONIX ) 40 MG tablet Take 1 tablet (40 mg total) by mouth daily. (Patient not taking: Reported on 04/26/2024) 30 tablet 5   prednisoLONE  acetate (PRED FORTE ) 1 % ophthalmic suspension 1 drop 2 (two) times daily. (Patient not taking: Reported on 04/26/2024)     methocarbamol  (ROBAXIN ) 500 MG tablet Take 1 tablet (500 mg total) by mouth every 8 (eight) hours as needed for muscle spasms. (Patient  not taking: Reported on 04/26/2024) 90 tablet 1   Facility-Administered Medications Prior to Visit  Medication Dose Route Frequency Provider Last Rate Last Admin   0.9 %  sodium chloride  infusion  500 mL Intravenous Once Abran Norleen SAILOR, MD        ROS: Review of Systems  Constitutional:  Negative for activity change, appetite change, chills, fatigue and unexpected weight change.  HENT:  Negative for congestion, mouth sores and sinus pressure.   Eyes:  Negative for visual disturbance.  Respiratory:  Negative for cough and chest tightness.   Gastrointestinal:  Negative for abdominal pain and nausea.  Genitourinary:  Negative for difficulty urinating, frequency and vaginal pain.  Musculoskeletal:  Positive for arthralgias, back pain and gait problem.  Skin:  Negative for pallor and rash.  Neurological:  Negative for dizziness, tremors, weakness, numbness and headaches.  Hematological:  Bruises/bleeds easily.  Psychiatric/Behavioral:  Negative for confusion and sleep disturbance.     Objective:  BP 116/80   Pulse 83   Ht 5' 5 (1.651 m)   Wt 178 lb (80.7 kg)   LMP 06/06/2015 Comment: irregular   SpO2 98%   BMI 29.62 kg/m   BP Readings from Last 3 Encounters:  04/26/24 116/80  04/05/24 137/84  03/09/24 120/70    Wt Readings from Last 3 Encounters:  04/26/24 178 lb (80.7 kg)  04/20/24 160 lb (72.6 kg)  04/05/24 165 lb (74.8 kg)    Physical Exam Constitutional:      General: She is not in acute distress.    Appearance: Normal appearance. She is well-developed.  HENT:     Head: Normocephalic.     Right Ear: External ear normal.     Left Ear: External ear normal.     Nose: Nose normal.  Eyes:     General:        Right eye: No discharge.        Left eye: No discharge.     Conjunctiva/sclera: Conjunctivae normal.     Pupils: Pupils are equal, round, and reactive to light.  Neck:     Thyroid : No thyromegaly.     Vascular: No JVD.     Trachea: No tracheal deviation.   Cardiovascular:     Rate and Rhythm: Normal rate and regular rhythm.     Heart sounds: Normal heart sounds.  Pulmonary:     Effort: No respiratory distress.     Breath sounds: No stridor. No wheezing.  Abdominal:     General: Bowel sounds are normal. There is no distension.     Palpations: Abdomen is soft. There is no mass.     Tenderness: There is no abdominal tenderness. There is no guarding or rebound.  Musculoskeletal:        General: Tenderness present.     Cervical back: Normal range of motion and neck supple. No rigidity.     Right lower leg: No edema.     Left lower leg: No edema.  Lymphadenopathy:     Cervical: No cervical adenopathy.  Skin:    Findings: No erythema or rash.  Neurological:     Mental Status: She is oriented to person, place, and time.     Cranial Nerves: No cranial nerve deficit.     Motor: No abnormal muscle tone.     Coordination: Coordination normal.     Gait: Gait abnormal.     Deep Tendon Reflexes: Reflexes normal.  Psychiatric:        Behavior: Behavior normal.        Thought Content: Thought content normal.        Judgment: Judgment normal.   LS w/pain  Antalgi gait  Lab Results  Component Value Date   WBC 7.1 02/09/2024   HGB 11.2 (L) 02/09/2024   HCT 33.3 (L) 02/09/2024   PLT 257.0 02/09/2024   GLUCOSE 100 (H) 02/09/2024   CHOL 148 12/16/2019   TRIG 71 12/16/2019   HDL 70 12/16/2019   LDLDIRECT 160.7 02/06/2012   LDLCALC 63 12/16/2019   ALT 11 02/09/2024   AST 18 02/09/2024   NA 136 02/09/2024   K 3.9 02/09/2024   CL 101 02/09/2024   CREATININE 1.04 02/09/2024   BUN 14 02/09/2024   CO2 28 02/09/2024   TSH 1.65 09/16/2021   INR 1.4 (H) 12/08/2021   HGBA1C 5.5 09/16/2021      Assessment & Plan:   Problem List Items Addressed This Visit     Essential hypertension   On Furosemide , KCl      Vitamin D  deficiency   On Vit D      Low back pain radiating down leg - Primary   Better on Norco to 7.5/325 tid   Potential benefits of a long term opioids use as well as potential risks (i.e. addiction risk, apnea etc) and complications (i.e. Somnolence, constipation and others) were  explained to the patient and were aknowledged. Robaxin  prn            Relevant Medications   HYDROcodone -acetaminophen  (NORCO) 7.5-325 MG tablet   Arthralgia   Chronic B knees, LBP Better on Norco to 7.5/325 tid  Potential benefits of a long term opioids use as well as potential risks (i.e. addiction risk, apnea etc) and complications (i.e. Somnolence, constipation and others) were explained to the patient and were aknowledged. Robaxin  intolerant - d/c      Primary osteoarthritis involving multiple joints   Chronic B knees, LBP Better on Norco to 7.5/325 tid  Potential benefits of a long term opioids use as well as potential risks (i.e. addiction risk, apnea etc) and complications (i.e. Somnolence, constipation and others) were explained to the patient and were aknowledged. Robaxin  intolerant - d/c      Relevant Medications   HYDROcodone -acetaminophen  (NORCO) 7.5-325 MG tablet      Meds ordered this encounter  Medications   HYDROcodone -acetaminophen  (NORCO) 7.5-325 MG tablet    Sig: Take 1 tablet by mouth every 6 (six) hours as needed for moderate pain (pain score 4-6).    Dispense:  90 tablet    Refill:  0    Code:M54.50, M79.606 Please fill on or after 04/27/24      Follow-up: No follow-ups on file.  Marolyn Noel, MD "

## 2024-04-28 ENCOUNTER — Other Ambulatory Visit: Payer: Self-pay | Admitting: Internal Medicine

## 2024-04-29 ENCOUNTER — Other Ambulatory Visit: Payer: Self-pay

## 2024-04-29 ENCOUNTER — Telehealth: Payer: Self-pay

## 2024-04-29 MED ORDER — POTASSIUM CHLORIDE ER 10 MEQ PO TBCR: 10.0000 meq | EXTENDED_RELEASE_TABLET | Freq: Two times a day (BID) | ORAL | 3 refills | Status: AC

## 2024-04-29 MED ORDER — FUROSEMIDE 40 MG PO TABS: 40.0000 mg | ORAL_TABLET | Freq: Every day | ORAL | 1 refills | Status: AC

## 2024-04-29 NOTE — Telephone Encounter (Signed)
 Copied from CRM #[id number]. Topic: Clinical - Medication Refill >> Apr 29, 2024  9:36 AM Viola F wrote: Medication: potassium chloride  (KLOR-CON  10) 10 MEQ tablet [[id number]]  Round white water pill ( patient doesn't know the name of medication)  Has the patient contacted their pharmacy? Yes (Agent: If no, request that the patient contact the pharmacy for the refill. If patient does not wish to contact the pharmacy document the reason why and proceed with request.) (Agent: If yes, when and what did the pharmacy advise?)  This is the patient's preferred pharmacy:   [id number] [place], [place] - [address] [place] [address] Phone: [phone] Fax: [phone]  Is this the correct pharmacy for this prescription? Yes If no, delete pharmacy and type the correct one.   Has the prescription been filled recently? Yes  Is the patient out of the medication? Yes  Has the patient been seen for an appointment in the last year OR does the patient have an upcoming appointment? Yes  Can we respond through MyChart? Yes  Agent: Please be advised that Rx refills may take up to 3 business days. We ask that you follow-up with your pharmacy.

## 2024-04-29 NOTE — Telephone Encounter (Signed)
 Sent to pts pharmacy

## 2024-05-02 ENCOUNTER — Encounter: Admitting: Internal Medicine

## 2024-05-04 ENCOUNTER — Encounter: Payer: Self-pay | Admitting: Internal Medicine

## 2024-05-04 ENCOUNTER — Ambulatory Visit: Admitting: Internal Medicine

## 2024-05-04 VITALS — BP 139/87 | HR 80 | Temp 97.1°F | Resp 19 | Ht 65.0 in | Wt 160.0 lb

## 2024-05-04 DIAGNOSIS — Z800 Family history of malignant neoplasm of digestive organs: Secondary | ICD-10-CM | POA: Diagnosis not present

## 2024-05-04 DIAGNOSIS — Z860101 Personal history of adenomatous and serrated colon polyps: Secondary | ICD-10-CM

## 2024-05-04 DIAGNOSIS — Z860100 Personal history of colon polyps, unspecified: Secondary | ICD-10-CM

## 2024-05-04 DIAGNOSIS — Z1211 Encounter for screening for malignant neoplasm of colon: Secondary | ICD-10-CM

## 2024-05-04 DIAGNOSIS — D122 Benign neoplasm of ascending colon: Secondary | ICD-10-CM

## 2024-05-04 DIAGNOSIS — K5730 Diverticulosis of large intestine without perforation or abscess without bleeding: Secondary | ICD-10-CM

## 2024-05-04 MED ORDER — SODIUM CHLORIDE 0.9 % IV SOLN: 500.0000 mL | Freq: Once | INTRAVENOUS | Status: DC

## 2024-05-04 NOTE — Progress Notes (Signed)
 Pt's states no medical or surgical changes since previsit or office visit.

## 2024-05-04 NOTE — Patient Instructions (Addendum)
    Handouts on polyps & diverticulosis given to you today  Await pathology results on polyps removed     YOU HAD AN ENDOSCOPIC PROCEDURE TODAY AT THE [facility]:   Refer to the procedure report that was given to you for any specific questions about what was found during the examination.  If the procedure report does not answer your questions, please call your gastroenterologist to clarify.  If you requested that your care partner not be given the details of your procedure findings, then the procedure report has been included in a sealed envelope for you to review at your convenience later.  YOU SHOULD EXPECT: Some feelings of bloating in the abdomen. Passage of more gas than usual.  Walking can help get rid of the air that was put into your GI tract during the procedure and reduce the bloating. If you had a lower endoscopy (such as a colonoscopy or flexible sigmoidoscopy) you may notice spotting of blood in your stool or on the toilet paper. If you underwent a bowel prep for your procedure, you may not have a normal bowel movement for a few days.  Please Note:  You might notice some irritation and congestion in your nose or some drainage.  This is from the oxygen used during your procedure.  There is no need for concern and it should clear up in a day or so.  SYMPTOMS TO REPORT IMMEDIATELY:  Following lower endoscopy (colonoscopy or flexible sigmoidoscopy):  Excessive amounts of blood in the stool  Significant tenderness or worsening of abdominal pains  Swelling of the abdomen that is new, acute  Fever of 100F or higher   For urgent or emergent issues, a gastroenterologist can be reached at any hour by calling [phone]. Do not use MyChart messaging for urgent concerns.    DIET:  We do recommend a small meal at first, but then you may proceed to your regular diet.  Drink plenty of fluids but you should avoid alcoholic beverages for 24 hours.  ACTIVITY:  You should  plan to take it easy for the rest of today and you should NOT DRIVE or use heavy machinery until tomorrow (because of the sedation medicines used during the test).    FOLLOW UP: Our staff will call the number listed on your records the next business day following your procedure.  We will call around 7:15- 8:00 am to check on you and address any questions or concerns that you may have regarding the information given to you following your procedure. If we do not reach you, we will leave a message.     If any biopsies were taken you will be contacted by phone or by letter within the next 1-3 weeks.  Please call us at [phone] if you have not heard about the biopsies in 3 weeks.    SIGNATURES/CONFIDENTIALITY: You and/or your care partner have signed paperwork which will be entered into your electronic medical record.  These signatures attest to the fact that that the information above on your After Visit Summary has been reviewed and is understood.  Full responsibility of the confidentiality of this discharge information lies with you and/or your care-partner.  

## 2024-05-04 NOTE — Op Note (Signed)
 [facility] Patient Name: Jessica Lynn Procedure Date: 05/04/2024 11:30 AM MRN: [id number] Endoscopist: Norleen SAILOR. Abran , MD, [id number] Age: 65 Referring MD:  Date of Birth: 29-Dec-1960 Gender: Female Account #: [id number] Procedure:                Colonoscopy with cold snare polypectomy x 2 Indications:              High risk colon cancer surveillance: Personal                            history of adenoma (10 mm or greater in size), High                            risk colon cancer surveillance: Personal history of                            multiple (3 or more) adenomas. Brother with colon                            cancer less than age 27. Previous examinations                            2017, 2020 Medicines:                Monitored Anesthesia Care Procedure:                Pre-Anesthesia Assessment:                           - Prior to the procedure, a History and Physical                            was performed, and patient medications and                            allergies were reviewed. The patient's tolerance of                            previous anesthesia was also reviewed. The risks                            and benefits of the procedure and the sedation                            options and risks were discussed with the patient.                            All questions were answered, and informed consent                            was obtained. Prior Anticoagulants: The patient has                            taken no anticoagulant or antiplatelet agents. ASA  Grade Assessment: II - A patient with mild systemic                            disease. After reviewing the risks and benefits,                            the patient was deemed in satisfactory condition to                            undergo the procedure.                           After obtaining informed consent, the colonoscope                            was passed under  direct vision. Throughout the                            procedure, the patient's blood pressure, pulse, and                            oxygen saturations were monitored continuously. The                            CF [id number] #[id number] was introduced through the anus                            and advanced to the the cecum, identified by                            appendiceal orifice and ileocecal valve. The                            ileocecal valve, appendiceal orifice, and rectum                            were photographed. The quality of the bowel                            preparation was excellent. The colonoscopy was                            performed without difficulty. The patient tolerated                            the procedure well. The bowel preparation used was                            SUPREP via split dose instruction. Scope In: 11:48:50 AM Scope Out: 12:04:20 PM Scope Withdrawal Time: 0 hours 11 minutes 19 seconds  Total Procedure Duration: 0 hours 15 minutes 30 seconds  Findings:                 Two polyps were found in the ascending colon. The  polyps were 2 to 5 mm in size. These polyps were                            removed with a cold snare. Resection and retrieval                            were complete.                           A few small-mouthed diverticula were found in the                            sigmoid colon.                           The exam was otherwise without abnormality on                            direct and retroflexion views. Complications:            No immediate complications. Estimated blood loss:                            None. Estimated Blood Loss:     Estimated blood loss: none. Impression:               - Two 2 to 5 mm polyps in the ascending colon,                            removed with a cold snare. Resected and retrieved.                           - Diverticulosis in the sigmoid colon.                            - The examination was otherwise normal on direct                            and retroflexion views. Recommendation:           - Repeat colonoscopy in 5 years for surveillance.                           - Patient has a contact number available for                            emergencies. The signs and symptoms of potential                            delayed complications were discussed with the                            patient. Return to normal activities tomorrow.                            Written discharge instructions  were provided to the                            patient.                           - Resume previous diet.                           - Continue present medications.                           - Await pathology results. Norleen SAILOR. Abran, MD 05/04/2024 12:10:27 PM This report has been signed electronically.

## 2024-05-04 NOTE — Progress Notes (Signed)
 HISTORY OF PRESENT ILLNESS:  Jessica Lynn is a 64 y.o. female with a history of multiple advanced adenomatous colon polyps and family history of colon cancer.  She presents today for surveillance colonoscopy.  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 OR;  Service: Orthopedics;  Laterality: Right;   TUBAL LIGATION     VAGINAL DELIVERY     x2    Social History Jessica Lynn  reports that she quit smoking about 18 years ago. Her smoking use included cigarettes. She started smoking about 22 years ago. She has a 0.8 pack-year smoking history. She has never used smokeless tobacco. She reports that she does not currently use alcohol. She reports that she does not use drugs.  family history includes Breast cancer in her paternal cousin; Colon cancer in her maternal uncle; Colon cancer (age of onset: 18) in her brother; Colon polyps in her father and mother; Stomach cancer in her brother.  Allergies[1]     PHYSICAL EXAMINATION: Vital signs: BP 137/73   Pulse 78   Temp (!) 97.1 F (36.2 C)   Resp 14   Ht 5' 5 (1.651 m)   Wt  160 lb (72.6 kg)   LMP 06/06/2015 Comment: irregular   SpO2 100%   BMI 26.63 kg/m  General: Well-developed, well-nourished, no acute distress HEENT: Sclerae are anicteric, conjunctiva pink. Oral mucosa intact Lungs: Clear Heart: Regular Abdomen: soft, nontender, nondistended, no obvious ascites, no peritoneal signs, normal bowel sounds. No organomegaly. Extremities: No edema Psychiatric: alert and oriented x3. Cooperative     ASSESSMENT:  Multiple advanced adenomatous colon polyps Family history of colon cancer   PLAN: Surveillance colonoscopy         [1]  Allergies Allergen Reactions   Methocarbamol  Other (See Comments)    Made pain worse   Crestor  [Rosuvastatin ] Other (See Comments)    cramps   Oxycodone  Itching

## 2024-05-04 NOTE — Progress Notes (Signed)
 Sedate, gd SR, tolerated procedure well, VSS, report to RN

## 2024-05-04 NOTE — Progress Notes (Signed)
 Called to room to assist during endoscopic procedure.  Patient ID and intended procedure confirmed with present staff. Received instructions for my participation in the procedure from the performing physician.

## 2024-05-05 ENCOUNTER — Telehealth: Payer: Self-pay

## 2024-05-05 NOTE — Telephone Encounter (Signed)
" °  Follow up Call-     05/04/2024   10:31 AM 04/05/2024   11:27 AM  Call back number  Post procedure Call Back phone  # [phone]  Permission to leave phone message Yes Yes     Patient questions:  Do you have a fever, pain , or abdominal swelling? No. Pain Score  0 *  Have you tolerated food without any problems? Yes.    Have you been able to return to your normal activities? Yes.    Do you have any questions about your discharge instructions: Diet   No. Medications  No. Follow up visit  No.  Do you have questions or concerns about your Care? No.  Actions: * If pain score is 4 or above: No action needed, pain <4.   "

## 2024-05-06 ENCOUNTER — Ambulatory Visit: Payer: Self-pay | Admitting: Internal Medicine

## 2024-05-06 LAB — SURGICAL PATHOLOGY

## 2024-07-25 ENCOUNTER — Ambulatory Visit: Admitting: Internal Medicine
